# Patient Record
Sex: Male | Born: 1960
Health system: Southern US, Community
[De-identification: ages and names within clinical notes are randomized; demographics above are authoritative.]

## PROBLEM LIST (undated history)

## (undated) DIAGNOSIS — F1020 Alcohol dependence, uncomplicated: Secondary | ICD-10-CM

## (undated) DIAGNOSIS — E872 Acidosis: Secondary | ICD-10-CM

## (undated) DIAGNOSIS — Z8489 Family history of other specified conditions: Secondary | ICD-10-CM

## (undated) DIAGNOSIS — K860 Alcohol-induced chronic pancreatitis: Secondary | ICD-10-CM

## (undated) DIAGNOSIS — F32A Depression, unspecified: Secondary | ICD-10-CM

## (undated) DIAGNOSIS — R413 Other amnesia: Secondary | ICD-10-CM

## (undated) DIAGNOSIS — F1110 Opioid abuse, uncomplicated: Secondary | ICD-10-CM

## (undated) DIAGNOSIS — E7800 Pure hypercholesterolemia, unspecified: Secondary | ICD-10-CM

## (undated) DIAGNOSIS — K709 Alcoholic liver disease, unspecified: Secondary | ICD-10-CM

## (undated) DIAGNOSIS — S41009A Unspecified open wound of unspecified shoulder, initial encounter: Secondary | ICD-10-CM

## (undated) DIAGNOSIS — IMO0001 Reserved for inherently not codable concepts without codable children: Secondary | ICD-10-CM

## (undated) DIAGNOSIS — N183 Chronic kidney disease, stage 3 unspecified: Secondary | ICD-10-CM

## (undated) DIAGNOSIS — Z794 Long term (current) use of insulin: Secondary | ICD-10-CM

## (undated) DIAGNOSIS — B1920 Unspecified viral hepatitis C without hepatic coma: Secondary | ICD-10-CM

## (undated) DIAGNOSIS — E119 Type 2 diabetes mellitus without complications: Secondary | ICD-10-CM

## (undated) DIAGNOSIS — F329 Major depressive disorder, single episode, unspecified: Secondary | ICD-10-CM

## (undated) DIAGNOSIS — M1990 Unspecified osteoarthritis, unspecified site: Secondary | ICD-10-CM

## (undated) DIAGNOSIS — E46 Unspecified protein-calorie malnutrition: Secondary | ICD-10-CM

---

## 1999-08-08 ENCOUNTER — Inpatient Hospital Stay (HOSPITAL_COMMUNITY): Admission: EM | Admit: 1999-08-08 | Discharge: 1999-08-11 | Payer: Self-pay | Admitting: Emergency Medicine

## 1999-08-08 ENCOUNTER — Encounter: Payer: Self-pay | Admitting: Emergency Medicine

## 2000-02-01 ENCOUNTER — Inpatient Hospital Stay (HOSPITAL_COMMUNITY): Admission: EM | Admit: 2000-02-01 | Discharge: 2000-02-03 | Payer: Self-pay | Admitting: Emergency Medicine

## 2000-06-26 ENCOUNTER — Encounter: Payer: Self-pay | Admitting: Internal Medicine

## 2000-06-26 ENCOUNTER — Inpatient Hospital Stay (HOSPITAL_COMMUNITY): Admission: EM | Admit: 2000-06-26 | Discharge: 2000-07-02 | Payer: Self-pay | Admitting: Anesthesiology

## 2000-07-14 ENCOUNTER — Encounter: Admission: RE | Admit: 2000-07-14 | Discharge: 2000-07-14 | Payer: Self-pay | Admitting: Internal Medicine

## 2000-08-02 ENCOUNTER — Encounter: Admission: RE | Admit: 2000-08-02 | Discharge: 2000-08-02 | Payer: Self-pay | Admitting: Internal Medicine

## 2003-10-09 ENCOUNTER — Inpatient Hospital Stay (HOSPITAL_COMMUNITY): Admission: EM | Admit: 2003-10-09 | Discharge: 2003-10-11 | Payer: Self-pay | Admitting: Family Medicine

## 2004-03-13 ENCOUNTER — Emergency Department (HOSPITAL_COMMUNITY): Admission: EM | Admit: 2004-03-13 | Discharge: 2004-03-13 | Payer: Self-pay | Admitting: Podiatry

## 2004-06-03 ENCOUNTER — Ambulatory Visit: Payer: Self-pay | Admitting: *Deleted

## 2004-06-23 ENCOUNTER — Ambulatory Visit: Payer: Self-pay | Admitting: Family Medicine

## 2004-10-02 ENCOUNTER — Ambulatory Visit: Payer: Self-pay | Admitting: Family Medicine

## 2004-12-04 ENCOUNTER — Ambulatory Visit: Payer: Self-pay | Admitting: Family Medicine

## 2006-01-17 ENCOUNTER — Emergency Department (HOSPITAL_COMMUNITY): Admission: EM | Admit: 2006-01-17 | Discharge: 2006-01-17 | Payer: Self-pay | Admitting: Emergency Medicine

## 2006-01-19 ENCOUNTER — Emergency Department (HOSPITAL_COMMUNITY): Admission: EM | Admit: 2006-01-19 | Discharge: 2006-01-19 | Payer: Self-pay | Admitting: Family Medicine

## 2006-01-21 ENCOUNTER — Emergency Department (HOSPITAL_COMMUNITY): Admission: EM | Admit: 2006-01-21 | Discharge: 2006-01-21 | Payer: Self-pay | Admitting: Emergency Medicine

## 2006-01-23 ENCOUNTER — Emergency Department (HOSPITAL_COMMUNITY): Admission: EM | Admit: 2006-01-23 | Discharge: 2006-01-23 | Payer: Self-pay | Admitting: Family Medicine

## 2007-04-11 ENCOUNTER — Emergency Department (HOSPITAL_COMMUNITY): Admission: EM | Admit: 2007-04-11 | Discharge: 2007-04-11 | Payer: Self-pay | Admitting: Emergency Medicine

## 2007-04-15 ENCOUNTER — Emergency Department (HOSPITAL_COMMUNITY): Admission: EM | Admit: 2007-04-15 | Discharge: 2007-04-15 | Payer: Self-pay | Admitting: *Deleted

## 2010-11-07 NOTE — H&P (Signed)
[place]. [facility]  Patient:    Raymond Winters, Raymond Winters                             MRN: [id number] Adm. Date:  [id number] Attending:  Rosanne Sack CC:         Room [phone]                         History and Physical  DATE OF BIRTH:  [birth year]  PROBLEM LIST: 1. Acute alcoholic pancreatitis. 2. Dehydration. 3. Polysubstance abuse; alcohol, cocaine, and heroin intranasally. 4. Mild elevation of the LFTs secondary to alcohol abuse. 5. History of ______ in 1980 and 1997.  CHIEF COMPLAINT:  Epigastric pain, nausea, vomiting.  HISTORY OF PRESENT ILLNESS:  The patient is a 50 year old black man, who presents with a 30-hour history of epigastric pain, radiating to the back following the elt distribution since about 8 p.m. on Thursday, August 07, 1999.  This epigastric pain was associated with nausea and vomiting.  The patient was unable to hold liquids or solids down since Thursday night.  The patient is a chronic alcoholic who drinks about a fifth of liquor a day.  He also uses cocaine and heroin on a  regular basis.  He has a previous history of pancreatitis as described in the problem list.  His last drink was taken on Thursday, August 07, 1999, at 6 p.m. The patient also drinks about 40 ounces of beer everyday.  He denies chest pain, shortness of breath, orthopnea, or dyspnea on exertion, PND, lower extremity edema, fever, chills, diarrhea, blurred vision, headache, syncope, tremor.  He denies  history of seizure activity or withdrawal syndrome in the past.  PAST MEDICAL HISTORY:  As problem list.  ALLERGIES:  None.  MEDICATIONS:  None.  FAMILY MEDICAL HISTORY:  Negative for diabetes, stroke, early coronary artery disease, malignancy, hypertension.  SOCIAL HISTORY:  The patient is single.  He has no children.  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  He denies smoking.  He drinks and uses cocaine and heroin as described in problem  list.  He denies intravenous drug use.  PHYSICAL EXAMINATION:  VITAL SIGNS:  Temperature 98.0, blood pressure 134/98, heart rate 67, respirations 16 to 22.  HEENT:  Normocephalic, atraumatic.  Nonicteric sclerae.  Conjunctivae within normal limits.  PERRLA.  EOMI.  Funduscopic examination negative for papilledema or hemorrhages.  Dry mucous membranes.  Oropharynx clear.  NECK:  Supple.  No JVD.  No bruits.  No adenopathy.  No thyromegaly.  LUNGS:  Clear to auscultation bilaterally without crackles, wheezes.  Good air movement bilaterally.  CARDIAC:  Regular rate and rhythm without murmurs, rubs, or gallops.  Normal S1, S2.  ABDOMEN:  Epigastric tenderness of the mild to moderate range.  No rebound.  No  guarding.  No hepatosplenomegaly.  Bowel sounds were present.  No bruits.  No masses.  RECTAL:  Examination, guaiac-negative stools.  Prostatic size within normal limits. No nodules.  Normal sphincter tone.  GU:  Examination within normal limits.  EXTREMITIES:  No edema, clubbing, or cyanosis.  Pulses 2+ bilaterally.  NEUROLOGICAL:  Alert and oriented x 3.  Strength 5/5 in all extremities.  DTRs /5 in all extremities.  Cranial nerves II-XII intact.  Sensorial intact.  Plantar reflexes downgoing bilaterally.  LABORATORY DATA:  Amylase 646, lipase 821, alcohol level undetectable.  Sodium 38, potassium 4.6, chloride 96,  CO2 32, BUN 13, creatinine 1.0, glucose 145, total bilirubin 1.0, alkaline phosphatase 103, SGOT 63, SGPT 57, albumin 4.1. Hemoglobin 15.8, MCV 84, WBC 6.7, platelets 260.  Urine drug screen positive for cocaine and opiates.  Urinalysis negative.  ASSESSMENT AND PLAN: 1. Acute pancreatitis with intractable epigastric pain, nausea and vomiting. 2. Alcohol abuse--the patient presents with acute alcoholic pancreatitis.    Also cocaine has been associated with pancreatitis.  The patients abdominal    examination shows no signs of peritoneal symptoms.   There is no evidence of    bowel obstruction.  The patient remains hemodynamically stable and afebrile.  Will admit the patient to a regular bed for bowel rest, supportive care    with intravenous fluids and pain control.  Intravenous Pepcid will be also    provided.  The patients pancreatic enzymes will be followed in the morning. 3. Dehydration--the patients physical examination is consistent with dehydration.    We will start intravenous hydration therapy.  The fluid balance will be    monitored daily as well as the patients orthostatic blood pressures. 4. Polysubstance abuse--we will go ahead and start thiamine, multivitamins. Due to    the high risk for withdrawal syndrome, we will go ahead and start Librium    p.o.  If the patient cannot tolerate tablets due to nausea, will consider    intravenous phenobarbital for alcohol detoxification.  Alcohol and drug    service information will be given to the patient prior to discharge.  I had  long discussion with Raymond Winters about the importance of quitting alcohol and    illegal drug use. 5. Mild elevation of the LFTs--this mild elevation of the transaminases are    likely to be associated with alcohol use.  Will follow with another set of    LFTs in about two days to confirm the improvement ______ expected being    off of alcohol.  I would not consider hepatitis serologies at this point. DD:  08/09/99 TD:  08/09/99 Job: [id number]

## 2010-11-07 NOTE — H&P (Signed)
[name], [name] NO.:  [id number]   MEDICAL RECORD NO.:  [id number]                   PATIENT TYPE:  EMS   LOCATION:  [facility]                                 FACILITY:  [facility]   PHYSICIAN:  Corinna L. Lendell Caprice, MD             DATE OF BIRTH:  [birth year]   DATE OF ADMISSION:  10/09/2003  DATE OF DISCHARGE:                                HISTORY & PHYSICAL   CHIEF COMPLAINT:  Comatose.   HISTORY OF PRESENT ILLNESS:  Raymond Winters is a 50 year old black male who was  brought to the emergency room by an acquaintance who has since left.  He  reportedly was obtunded and had a respiratory rate of 6.  His friend stated  that he had been doing drugs.  The patient was given Narcan and he woke up.  The patient reports that he just got out of prison today where he was  incarcerated for 3 months for drug-related crime.  He injected heroin,  smoked crack and drank a 40-ounce and some liquor.  He reports that he had a  negative HIV and hepatitis screen in jail.  He has a strong history of  alcohol and polysubstance abuse prior to incarceration.  Also of note, the  patient has been having polyuria, polydipsia, blurry vision, weight loss and  has a family history of diabetes.   PAST MEDICAL HISTORY:  Past medical history is significant for:  1. Pancreatitis.  2. Polysubstance abuse.   MEDICATIONS:  None.   ALLERGIES:  None.   SOCIAL HISTORY:  The patient plans on staying with his sister once he is  discharged from the hospital, but again, he just got out of jail.  He smokes  cigarettes occasionally, uses IV drugs but does not share needles.  He is  heterosexual.  He drinks alcohol and has been admitted with alcoholic  pancreatitis in the past.  He has also the crack cocaine abuse history.   FAMILY HISTORY:  His mother died of complications of diabetes.  His sister  has diabetes.   REVIEW OF SYSTEMS:  Review of systems as above, otherwise, negative.   PHYSICAL  EXAMINATION:  VITAL SIGNS:  On physical examination, his  temperature is 97.1, blood pressure 101/54, pulse initially was 115,  respirations initially 6, oxygen saturation initially 83%; currently, his  blood pressure is 115/74, pulse 106, respiratory rate 20, oxygen saturation  100% on room air.  GENERAL:  In general, the patient is alert and oriented, in no acute  distress.  HEENT:  Normocephalic, atraumatic.  Pupils equal, round and reactive to  light.  Sclerae nonicteric.  He has slightly dry mucous membranes.  NECK:  Neck is supple.  No lymphadenopathy.  No thyromegaly.  LUNGS:  Lungs are clear to auscultation bilaterally without wheezes, rhonchi  or rales.  CARDIOVASCULAR:  Regular rate and rhythm without murmurs, gallops or rubs.  ABDOMEN:  Abdomen is soft, nontender, nondistended.  GU AND RECTAL:  Deferred.  EXTREMITIES:  No clubbing, cyanosis, or edema.  SKIN:  No rash.  PSYCHIATRIC:  The patient is calm and cooperative.  NEUROLOGIC:  Alert and oriented.  Cranial nerves and sensorimotor exam  intact.   LABORATORIES:  White blood cell count 5, hemoglobin 11, hematocrit 32,  platelet count 256,000.  Sodium 130, potassium 3.7, chloride 94, bicarbonate  16, glucose 700, BUN 16, creatinine 1.4, albumin 3.6, total protein 6.6,  calcium 8.6, AST 76, ALT 106, alkaline phosphatase 91, total bilirubin 0.8.  Blood alcohol level is 169.  Urine drug screen positive for cocaine and  opiates.   EKG shows SVT, probably sinus tachycardia to my reading.   ASSESSMENT AND PLAN:  1. Heroin overdose.  The patient is currently alert and oriented and has     stable vital signs.  He will be admitted to the stepdown unit for     monitoring.  I have encouraged him to abstain from drugs and will     certainly need Alcoholics Anonymous or Narcotics Anonymous as an     outpatient.  His first use since getting out of jail was today and I     therefore feel that withdrawal from any substance is unlikely  in this     case.  2. Alcohol intoxication.  3. Crack cocaine abuse.  4. Probable diabetic ketoacidosis.  He has an anion gap of 20.  I will check     his blood for acetone but I suspect the acidosis is due to diabetic     ketoacidosis, but also likely is lactic acidosis from his overdose.     Nevertheless, he will be placed on intravenous fluids, insulin drip,     serial electrolytes.  He will need diabetic teaching and given the fact     that he is a new-onset diabetic and due to his thin nature, I suspect he     will need insulin upon discharge, at least short-term.  5 . History of pancreatitis.                                                Corinna L. Lendell Caprice, MD    CLS/MEDQ  D:  10/09/2003  T:  10/10/2003  Job:  [id number]

## 2010-11-07 NOTE — Discharge Summary (Signed)
[place]. [facility]  Patient:    Raymond Winters, Raymond Winters                             MRN: [id number] Adm. Date:  [id number] Disc. Date: [id number] Attending:  Phifer, Harriett Sine [place] Dictator:   Marisue [place], M.D.                           Discharge Summary  DISCHARGE DIAGNOSES: 1.  Alcoholic pancreatitis. 2.  Alcoholic hepatitis. 3.  Cocaine heroine abuse.  DISCHARGE MEDICATIONS: 1.  Protonics 40 mg one tablet let in the morning. 2.  Thiamine 100 mg one tablet in the morning. 3.  Folic acid 1 mg one tablet in the morning. 4.  Darvocet-N 100 one table four times a day as needed.  CONSULTATIONS: None.  PROCEDURES: None.  CHIEF COMPLAINT ON ADMISSION: Abdominal pain of one days duration.  HISTORY OF PRESENT ILLNESS: A 50 year old African-American male, Raymond Winters, with a history significant for alcohol abuse, presented to the emergency department with abdominal pain for one day. This started after an episode of alcohol use, six pack of beer and half pint of liquor two days prior to admission. The patient is epigastric, burning type with no radiation, associated with pain on the right side of the back relieved by resting on the left side. He admits to being nauseated with a few episodes of vomiting. Denies fever, chills, diarrhea, hematemesis, hematochezia, melena. Denied shortness of breath, chest pain, headache, blurred vision, syncope, tremors. Denied history of seizure activity or withdrawn seizures in the past.  PAST MEDICAL HISTORY: History of hospitalization for alcoholic pancreatitis in 1980, 1997 and in February of 2001.  MEDICATIONS: None.  ALLERGIES: None.  HABITS: Cocaine and heroine occasional use. Denies tobacco use. Denies intravenous drug use and has been consuming alcohol for the past 20 years.  FAMILY HISTORY: Noncontributory.  SOCIAL HISTORY: Single. No children. He is in the [employer] business.  PHYSICAL EXAMINATION:  VITAL SIGNS:  Temperature was 98.4, pulse rate 70, blood pressure 140/90, respiratory rate 15, 98% saturation on room air.  GENERAL: He was a well-nourished, moderately built male in moderate distress.  HEENT: Muddy conjunctivae and icteric. Pupils round, reactive to light and accommodation. Extraocular muscles intact. Tympanic membranes and oropharynx clear and moist.  NECK: Supple. No lymphadenopathy, jugular vein distension, bruits or thyromegaly.  CARDIOVASCULAR: S1 and S2 present. Rate and rhythm regular without murmurs, rubs or gallops.  RESPIRATORY: Clear to auscultation bilaterally without adventitious sounds.  ABDOMEN: Moderate epigastric tenderness. Soft and nondistended. Bowel sounds present. No hepatorenosplenomegaly.  EXTREMITIES: No cyanosis, clubbing, or pedal edema. Pulses are 3+.  NEUROLOGICAL: Alert and oriented times three. Cranial nerves 2-12 grossly intact. Sensation and motor examinations were normal.  LABORATORY VALUES ON ADMISSION: WBC 7.2, hemoglobin 14.6, MCV 84, platelets 267.  Sodium 130, potassium 3.9, chloride 94, bicarbonate 24, BUN 11, creatinine 0.7, glucose 126. AST 101, ALT 66, alkaline phosphatase 89, bilirubin 1.1, total protein 8.9, albumin 7.4, amylase 543, lipase 689.  Urinalysis showed wbc 67 and small leukocyte esterase positive.  HOSPITAL COURSE: #1 - ALCOHOLIC PANCREATITIS: Evidence from his alcohol use, elevated lipase, nausea and vomiting and we made a presumptive diagnosis of alcoholic pancreatitis. We kept him no per oral, hydrated him, gave him morphine sulfate with adequate pain relief. His pain gradually declined from 9/10 to 7/10 and to 4/10 to 2/10 on the day of  discharge. He is completely asymptomatic and wished to go home. He also wished to eat and he was started on clear liquids and finally was advanced to a solid diet on discharge.  #2 - ELEVATED TRANSAMINASE: AST and ALT were in the ratio of 2:1 which is diagnostic of alcohol  use. He was given protective thiamine and folate during his stay here. The cause of the elevated transaminase is prolonged alcohol use. He is being discharged also on thiamine and folate.  #3 - ALCOHOL, HEROINE AND COCAINE ABUSE: He was counseled to abstain from substance abuse and he is has agreed to go through the Alcoholic Anonymous meetings. He has been given a chart which explains all the different days and different timings when the Alcoholic Anonymous meetings are conducted in [place]. The patient has shown active interest in going to these programs. He has not wished for any other resources or help.  DISPOSITION: The patient is discharged home, condition stable. Diet regular.  RESIDENT PHYSICIAN: Dr. Marcelino Duster DD:  02/03/00 TD:  02/04/00 Job: [id number]

## 2010-11-07 NOTE — Discharge Summary (Signed)
[place]. [facility]  Patient:    Raymond Winters, Raymond Winters                             MRN: [id number] Adm. Date:  [id number] Disc. Date: [id number] Attending:  Rosanne Sack CC:         [facility], Medical Records, [place], [place]             [place]. Sharyn Lull, M.D.                           Discharge Summary  ADMISSION DIAGNOSES:  1960/11/22  FINAL DIAGNOSES: 1. Acute alcoholic pancreatitis. 2. Dehydration. 3. Hypokalemia secondary to alcoholism, dehydration, and hypomagnesemia. 4. Hypomagnesemia secondary to alcoholism. 5. Hypertension. 6. Polysubstance abuse. 7. Chronic alcoholism.    a. History of acute pancreatitis in 1980 and 1997.    b. Mild alcoholic hepatitis.  DISCHARGE MEDICATIONS: 1. K-Dur 40 mEq p.o. q.d. x 1 week. 2. Maalox, magnesium oxide 400 mg p.o. t.i.d. x 2 weeks. 3. Librium 25 mg b.i.d. x 1 day, then 25 mg q.d. x 2 days, then stop. 4. Klonopin 0.1 mg p.o. b.i.d. 5. Pepcid 20 mg p.o. b.i.d. 6. Vicodin 1-2 tablets q.4h. p.r.n. pain, 20 pills, no refills. 7. Phenergan 25 mg p.o. q.6h. p.r.n. nausea.  DISCHARGE FOLLOW-UP AND DISPOSITION:  Raymond Winters will be followed in the [facility] in [place] within five to seven days.  During this follow-up  visit, the patients serum potassium and serum magnesium are to be reevaluated.  The patients blood pressure also needs to be rechecked at the time of this hospitaL follow-up visit.  CONSULTATIONS:  None.  PROCEDURES:  None.  ADMISSION LABORATORY DATA:  Amylase 646, lipase 821.  DISCHARGE LABORATORY DATA:  Lipase 37, magnesium 1.4.  Sodium 136, potassium 3.5, chloride 103, CO2 28, BUN 6, creatinine 1.0, glucose 93.  HOSPITAL COURSE:  Raymond Winters is a very pleasant 50 year old African-American male admitted to [facility] on August 08, 1999, with acute alcoholic pancreatitis.  Please see admission H&P by Dr. Jamie Brookes for further details regarding the history  of presentation, physical exam, and laboratory data.  #1 - ACUTE ALCOHOLIC PANCREATITIS:  The patient was admitted with epigastric pain that radiated to the back following the belt distribution.  The patients pancreatic enzymes were elevated on admission.  The amylase was 646 and the lipase 821.  No evidence of acute abdomen was noticed on physical exam.  No evidence of infectious process was identified.  The patient was admitted for bowel rest, pain control, and supportive care.  Intravenous Pepcid was given.  Also, Demerol intravenously was given to the patient for pain control.  The lipase decreased o 396 on day #2.  On the day of discharge, the lipase was 37.  On the day of discharge, the patient was able to tolerate a full liquid diet without complications.  Pepcid was switched to tablets.  The pain was well controlled just with Vicodin.  Further follow-up on the patients acute pancreatitis will be done in the [facility] in [place], [place].  During this admission, no evidence of pseudocyst by clinical response to therapy and physical exam was noticed.  The medical condition at the time of discharge was determined improved.  #2 - DEHYDRATION:  On admission, the physical exam was consistent with dehydration. Intravenous hydration therapy was given throughout this hospital  stay.  At the ime of discharge, no evidence of dehydration was noticed.  #3 - HYPOKALEMIA, HYPOMAGNESEMIA:  These two electrolyte abnormalities were associated with malnutrition, alcoholism, and dehydration.  Potassium and magnesium supplementation were provided during this hospital stay.  No complications associated with these electrolyte abnormalities were noticed.  On day #2, the patients potassium was 3.4, and the magnesium level was 1.4.  The phosphorus level was 2.7.  With supplementation, the patients potassium started to improve at the time of discharge.  Further follow-up on the  patients magnesium and potassium levels will be done at the [place] in [place].  #4 - POLYSUBSTANCE ABUSE:  Raymond Winters reported alcohol, cocaine, and heroin (intranasally and not intravenously) abuse.  Given the degree of alcoholism, Librium was started on admission without complications.  The only evidence of perhaps some mild alcohol withdrawal syndrome was slight hypertension. Otherwise, no evidence of seizures or confusion was noticed throughout this hospital stay. We discussed at length with the patient the importance of rehabilitation upon discharge for these polysubstance abuses.  Alcohol and drug services information was given to the patient.  The patient was advised to enroll himself in one of these rehabilitation programs upon discharge.  He would like to check with the [facility] to see what kind of resources is eligible for prior to send the  rehabilitation program.  Follow-up of the patients LFTs also is recommended based on the mild alcoholic hepatitis.  On admission, the SGOT was 63 and the SGPT 57.  On the day of discharge, the patient was afebrile and hemodynamically stable. is medical condition was determined improved at the time of discharge. DD:  08/11/99 TD:  08/11/99 Job: [id number]

## 2010-11-07 NOTE — Discharge Summary (Signed)
[name], Winters NO.:  [id number]   MEDICAL RECORD NO.:  [id number]                   PATIENT TYPE:  INP   LOCATION:  6715                                 FACILITY:  [facility]   PHYSICIAN:  Jackie Plum, M.D.             DATE OF BIRTH:  01-Dec-1960   DATE OF ADMISSION:  10/09/2003  DATE OF DISCHARGE:  10/11/2003                                 DISCHARGE SUMMARY   DISCHARGE DIAGNOSES:  1. Mental status change secondary to diabetes ketoacidosis, thus resolved.  2. Diabetic ketoacidosis, resolved.  3. Newly diagnosed diabetes mellitus.  4. History of polysubstance abuse.  5. History of alcohol abuse.   DISCHARGE MEDICATIONS:  1. Lantus 30 units subcutaneous injection q.h.s.  2. Glyburide 2.5 mg p.o. daily.  3. Timentin 100 mg p.o. daily.  4. Folic acid 1 mg p.o. daily.   ACTIVITY:  As tolerated.   DIET:  2000 calorie ADA diet.   DISCHARGE INSTRUCTIONS:  The patient has been instructed to take his  medications as instructed.  He should be compliant to his dietary  restrictions.  He has been counseled extensively to stop using heroine,  cocaine, and alcohol.  The effects of these drugs of abuse on his general  well being has been discussed with the patient and he expressed  understanding.  He is to follow up with diabetes education center next week  and also with [employer] as soon as possible to register to be  seen by Raechel Marcos in less than four weeks.   CONDITION ON DISCHARGE:  Improving and satisfactory.   REASON FOR ADMISSION:  Diabetic ketoacidosis.  Please see admission H&P by  Corinna L. Lendell Caprice, M.D. dictated October 09, 2003.  The patient presented to  the emergency room with obtundation and respiratory rate of about 6 per  minute.  He received Narcan after which the patient's mental status was  improved.  He has just gotten out of prison recently after 10 months of  incarceration for drug-related crime.  He had used  some liquor and heroine  and smoked some crack cocaine as well.  The patient had indicated history of  polyuria and polydipsia with visual changes as well as weight loss.  His  family history is positive for diabetes in his mother and siblings.   PHYSICAL EXAMINATION:  According to admission H&P by Dr. Lendell Caprice.  VITAL SIGNS:  Temperature 97.1, heart rate of 115 per minute, satisfactory  of 83% on room air.  His respiratory rate which was 6 initially has been  improved with rate up to 20 per minute and saturations had gone up to 100%  on room air thereafter.  GENERAL:  Alert and oriented.  HEENT:  Pupils equal, round, and reactive to light.  He did not have any  conjunctival icterus.  Mucous membranes were said to be dry.  LUNGS:  Clear to  auscultation bilaterally.  HEART:  Auscultation was unremarkable.  ABDOMEN:  Soft and nontender with normal active bowel sounds.  EXTREMITIES:  He did not have edema.  NEUROLOGY:  Intact.   LABORATORY DATA:  White blood cell count 5, hemoglobin 11, hematocrit 32,  platelet count 236,000.  He had hyponatremia of 130, bicarbonate of 60,  glucose 700, potassium 1.4, and SGOT of 76 with SGPT of 106.  His urine drug  screen was positive for cocaine and opiates.   EKG showed sinus tachycardia, and his __________ anion gap was 20.   He was therefore admitted to Hospitalist Service for management of diabetic  ketoacidosis.   HOSPITAL COURSE:  Problem 1.  DK.  The patient was admitted to ICU.  He was  started on IV Insulin drip per [facility] protocol.  He was hydrated with  IV fluids.  His lipid status was carefully monitored and any imbalances were  expeditiously corrected.  By yesterday, i.e., next day after admission, the  patient's anion gap has closed and his glucose levels were less than 50 mg  per day.  Yesterday, he did not have any chest pain or dizziness.  He just  felt a little bit weak without any fever or chills.  He was therefore   transferred to telemetry bed for further monitoring and observation  overnight.  The patient was seen by nutrition consult at which time,  additional requirements and expectations with respect to his diagnosis of  diabetes which is new were discussed and appropriately confirmed for  understanding with the patient.  He is currently not in DKA.  He is deemed  appropriate for discharge today.  On rounds this morning, the patient's  blood pressure was 128/72, his CBG was 132 mg/dl, respiratory rate 20,  temperature 98.5 degrees Fahrenheit, pulse rate 92 per minute.  He had no  chest pain, shortness of breath, dizziness, generalized weakness, or visual  changes.  He does not have any polyuria or polydipsia.  Mucous membranes  were moist.  He does not any JVD.  Lungs were clear to auscultation  bilaterally.  Cardiac examination revealed regular rate and rhythm without  gallops or murmur.  Abdomen was soft and nontender with normal active bowel  sounds.  Extremities did not reveal any edema.   LABORATORY DATA:  Lab work done yesterday indicated hemoglobin A1C of 13.2%.  The patient is therefore being discharged home to his family with continued  outpatient treatment of his diabetes.  I prescribed about 45 days of  treatment for him to last him for the time being while he is awaiting for  appointment with a Nolberto Cheuvront at [facility].  He showed that he can inject  himself with insulin, he knows how to do it, and has received adequate  teaching by staff prior to discharge.   This morning I spent some time to discuss with the patient the reason for  hospitalization, all the workup that was done, his final discharge  diagnosis, and the tailored plan of care for him on the outpatient level.  All his questions were appropriately answered satisfactorily.   I spent more than 30 minutes preparing this patient for discharge today.                                               Jackie Plum,  M.D.   GO/MEDQ  D:  10/11/2003  T:  10/11/2003  Job:  [id number]   cc:   [facility]   [facility]

## 2010-11-07 NOTE — Discharge Summary (Signed)
[place]  Patient:    Raymond Winters                             MRN: [id number] Adm. Date:  [id number] Disc. Date: [id number] Attending:  Farley Ly                           Discharge Summary  DISCHARGE DIAGNOSES: 1. Alcoholic pancreatitis. 2. Polysubstance abuse. 3. Anemia, mild.  DISCHARGE MEDICATIONS:  None  FOLLOWUP APPOINTMENT:  Raymond Winters was instructed to followup with Dr. Wilkie Aye in the Internal Medicine Outpatient clinic at 2:30 p.m. on July 14, 2000.  PROCEDURES:  None  COMPLICATIONS:  None  CHIEF COMPLAINT:  Nausea, vomiting.  HISTORY OF PRESENT ILLNESS:   Raymond Winters is a 50 year old black male with a past medical history of pancreatitis and polysubstance disease who presents with a one day history of nausea, vomiting, and abdominal tenderness.  Raymond Winters states that after drinking a pink of liquor yesterday, he began to experience nausea and vomiting.  He reports emesis x approximately 7-8 times and denies blood in his emesis.  He denies diarrhea or constipation, melena, and bright red blood per rectum.  He denies stool color changes.  He reports decreased p.o. with his good intake being Thursday.  He also states that his abdomen became tender yesterday but denies radiation to back.  He states the pain is continuous and rates it at a 8-9 out of 10.  He took Tylenol with some relief and is able to achieve some relief with change in position.  REVIEW OF SYSTEMS: Negative for fevers or chills, visual or hearing changes, cough, chest pain, shortness of breath, and dysuria.  PAST MEDICAL HISTORY: 1. History of pancreatitis.  Last episode three to four months ago. 2. Polysubstance abuse (continued alcohol abuse, remote heroine and cocaine    use per patient).  MEDICATIONS: None  ALLERGIES:  No known drug allergies.  SOCIAL HISTORY: [name] lives here in [place] by himself.  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  He does not smoke tobacco currently although he has a remote history.  He drinks approximately one pint of liquor a day, and one to two beers a day.  He denies elicit drug use.  FAMILY MEDICAL HISTORY:  Mom died from stroke at the age of 28, and dad died from an MI at the age of 86.  He has sister with diabetes and hypertension. He has seven brothers and two sisters all in good health.  PHYSICAL EXAMINATION:  VITAL SIGNS:  Temperature 97.0, blood pressure 114/81, pulse 103, respiratory rate 20.  GENERAL:  This is a restless, agitated, black male.  HEENT:  Pupils are equally round and minimally reactive, pupils small bilaterally, extraocular muscles are intact, oropharynx clear without erythema or exudate, tongue notably tremulous.  CARDIOVASCULAR: Regular rate and rhythm, heart sounds are distant.  RESPIRATORY:  Clear to auscultation bilaterally.  ABDOMEN:  Bowel sounds present in all four quadrants, soft, nondistended, diffusely tender greatest in the mid epigastric region.  EXTREMITIES:  No edema in lower extremities bilaterally, 1+ posterior tibial pulses bilaterally.  NEUROLOGIC:  Cranial nerves 2-12 grossly intact, no focal deficits.  LABORATORY DATA:  CBC:  White count 7.7, hemoglobin 14.6, MCV 85.9, platelets 285,000, 77 neutrophils and 14 lymphocytes.  BMP: Sodium 134, potassium 3.5, chloride 97, bicarb 21,  BUN 12, creatinine 0.9 and glucose 123. Calcium 8.8, total protein 7.6, albumin 4.1, AST 47, ALT 36, alkaline phosphatase 79, total bilirubin 0.5.  Alcohol level 98.  Lipase 734 and amylase 488.  HOSPITAL COURSE: #1 ALCOHOLIC PANCREATITIS:  Raymond Winters was noted to have markedly elevated pancreatic enzymes on admission as well as severe abdominal pain.  He was managed with IV pain medications and made NPO for bowel rest.  Over the next few days, he gradually began to have a returning desire to eat and he was started on clear liquids.  His pain  medications were decreased to p.o. pain medications. He tolerated this well and his diet was advanced to a soft diet and pain medications discontinued.  However, after having one to two meals on soft diet, Raymond Winters again began to have abdominal pain.  Therefore, he was returned to a clear liquid diet and again given p.o. pain medications.  After again tolerating several meals of clear liquids well, Raymond Winters again was advanced to a soft diet and his pain medications discontinued.  This time, he tolerated the diet well without a return of abdominal pain or nausea or vomiting.  #2 POLYSUBSTANCE ABUSE:  Raymond Winters was seen while in the hospital by the care manager.  Options and community resources for polysubstance abuse was discussed.  Care management reports that he did have a great deal of insight into his problem and was interested in pursuing some of these resources.  He was given information and was given repeated encouragement to pursue these resources.  #3 ANEMIA, MILD:  Raymond Winters was noted to have mild anemia while in the hospital.  He was heme negative on rectal and this anemia is likely secondary to a combination of bone marrow suppression secondary to alcohol abuse and hydration while in the hospital.  This may need further workup as an outpatient if it persists.  DISCHARGE LABORATORY:  CBC:  White count 3.7, hemoglobin 12.1, MCV 86.9, platelet count 234,000.  BMP: Sodium 137, potassium 3.9, chloride 104, bicarb 28, glucose 101, BUN 4, and creatinine 1.0, calcium 8.5.  DISPOSITION:  Raymond Winters was discharged to home in good condition. DD:  07/07/00 TD:  07/07/00 Job: [id number]

## 2011-04-01 LAB — I-STAT 8, (EC8 V) (CONVERTED LAB)
Acid-base deficit: 2
BUN: 5 — ABNORMAL LOW
Bicarbonate: 23.4
Chloride: 104
Glucose, Bld: 639
HCT: 40
Hemoglobin: 12.6 — ABNORMAL LOW
Hemoglobin: 13.6
Operator id: 285841
Potassium: 3.7
Sodium: 135
Sodium: 137
TCO2: 25
TCO2: 26
pH, Ven: 7.349 — ABNORMAL HIGH

## 2011-04-01 LAB — URINE MICROSCOPIC-ADD ON

## 2011-04-01 LAB — CBC
HCT: 33.6 — ABNORMAL LOW
HCT: 36.7 — ABNORMAL LOW
Hemoglobin: 12.4 — ABNORMAL LOW
MCHC: 33.7
MCHC: 33.9
MCV: 82.8
MCV: 84.7
Platelets: 207
Platelets: 223
RBC: 4.43
RDW: 13
WBC: 4.4
WBC: 7.8

## 2011-04-01 LAB — URINALYSIS, ROUTINE W REFLEX MICROSCOPIC
Glucose, UA: >1000 — AB
Hgb urine dipstick: NEGATIVE
Leukocytes, UA: NEGATIVE
Protein, ur: NEGATIVE
Specific Gravity, Urine: 1.03
Urobilinogen, UA: 0.2

## 2011-04-01 LAB — RAPID URINE DRUG SCREEN, HOSP PERFORMED: Tetrahydrocannabinol: NOT DETECTED

## 2011-04-01 LAB — POCT I-STAT CREATININE
Operator id: 285841
Operator id: 288331

## 2012-12-17 ENCOUNTER — Encounter (HOSPITAL_COMMUNITY): Payer: Self-pay | Admitting: Emergency Medicine

## 2012-12-17 ENCOUNTER — Emergency Department (HOSPITAL_COMMUNITY)
Admission: EM | Admit: 2012-12-17 | Discharge: 2012-12-17 | Disposition: A | Discharge status: Home / Self Care | Payer: Self-pay | Attending: Emergency Medicine | Admitting: Emergency Medicine

## 2012-12-17 DIAGNOSIS — T401X1A Poisoning by heroin, accidental (unintentional), initial encounter: Secondary | ICD-10-CM | POA: Insufficient documentation

## 2012-12-17 DIAGNOSIS — F1110 Opioid abuse, uncomplicated: Secondary | ICD-10-CM | POA: Insufficient documentation

## 2012-12-17 DIAGNOSIS — Y939 Activity, unspecified: Secondary | ICD-10-CM | POA: Insufficient documentation

## 2012-12-17 DIAGNOSIS — Y929 Unspecified place or not applicable: Secondary | ICD-10-CM | POA: Insufficient documentation

## 2012-12-17 DIAGNOSIS — T401X4A Poisoning by heroin, undetermined, initial encounter: Secondary | ICD-10-CM | POA: Insufficient documentation

## 2012-12-17 NOTE — ED Notes (Signed)
Pt very agitated and is asking to be discharged. MD informed.

## 2012-12-17 NOTE — ED Notes (Addendum)
Per EMS, pt using heroin on a daily basis, pt's family called EMS when they found the pt on the bathroom floor. Pt was unresponsive when EMS arrive and breathing 4-6x/min, EMS inserted an OPA and gave the pt 2mg  of narcan. Pt became responsive en route to ED. Pt's 02 sats reading 99% upon arrival. Pt was given narcan at about 1600 today. Pt states he had "some alcohol" today. Pt A&O upon arrival.

## 2012-12-17 NOTE — ED Provider Notes (Signed)
History    CSN: [id number] Arrival date & time 12/17/12  1624  First MD Initiated Contact with Patient 12/17/12 1627     Chief Complaint  Patient presents with  . Drug Overdose    Patient is a 52 y.o. male presenting with Overdose. The history is provided by the patient and the EMS personnel.  Drug Overdose This is a new problem. The current episode started 1 to 2 hours ago. The problem has been rapidly improving. Pertinent negatives include no chest pain, no abdominal pain, no headaches and no shortness of breath. Nothing aggravates the symptoms. Nothing relieves the symptoms. Treatments tried: narcan. The treatment provided significant relief.   Pt admits to heroin abuse earlier today EMS reports they were called out to house as patient was found in bathroom floor unresponsive and bradypneic He was given narcan intranasally and oral airway was placed and then patient soon woke up.  This was given at approximately 1600 He has no other complaints He denies SI He reports frequent heroin use He also admits to ETOH use Denies any other drug abuse     PMH - Diabetes Soc hx - heroin abuse   History  Substance Use Topics  . Smoking status: Not on file  . Smokeless tobacco: Not on file  . Alcohol Use: Yes    Review of Systems  Constitutional: Negative for fever.  Respiratory: Negative for cough and shortness of breath.   Cardiovascular: Negative for chest pain.  Gastrointestinal: Negative for vomiting and abdominal pain.  Skin: Negative for color change.  Neurological: Negative for headaches.  Psychiatric/Behavioral: Negative for suicidal ideas.  All other systems reviewed and are negative.    Allergies  Review of patient's allergies indicates no known allergies.  Home Medications  No current outpatient prescriptions on file. SpO2 98% Physical Exam CONSTITUTIONAL: Well developed/well nourished HEAD: Normocephalic/atraumatic, no signs of trauma EYES:  EOMI/PERRL ENMT: Mucous membranes moist NECK: supple no meningeal signs SPINE:entire spine nontender, No bruising/crepitance/stepoffs noted to spine CV: S1/S2 noted, no murmurs/rubs/gallops noted LUNGS: Lungs are clear to auscultation bilaterally, no apparent distress ABDOMEN: soft, nontender, no rebound or guarding GU:no cva tenderness NEURO: Pt is awake/alert, moves all extremitiesx4.  GCS 15.  He does not appear intoxicated EXTREMITIES: pulses normal, full ROM Well healed lesions to both forearms.  No abscess/cellulitis noted to any extremity SKIN: warm, color normal PSYCH: no abnormalities of mood noted  ED Course  Procedures   4:51 PM Pt with heroin overdose that responded to narcan He is now feeling improved, no distress and no complaints Pt is requesting d/c home I advised him of possibility of relapse due to short half life of narcan He agrees to wait until 1730 He does not want any labs performed. He reports he has otherwise been feeling well prior to overdose 5:36 PM Pt stable, no new complaints, he is awake alert and no symptoms He agrees to stay one more hour I spoke to patient and his family at length about need to be monitored in the ED 6:48 PM Pt now requesting d/c home He has no complaints, he is awake/alert.   At this point, I feel he is safe for d/c home I discussed strict return precautions with patient and family  MDM  Nursing notes including past medical history and social history reviewed and considered in documentation     Date: 12/17/2012  Rate: 102  Rhythm: sinus tachycardia  QRS Axis: normal  Intervals: normal  ST/T Wave abnormalities: nonspecific  ST changes  Conduction Disutrbances:none  Narrative Interpretation:   Old EKG Reviewed: unchanged from 04/15/2007       Joya Gaskins, MD 12/17/12 [phone]

## 2012-12-17 NOTE — ED Notes (Signed)
Family requested drug rehab information for when patient is discharged.

## 2012-12-18 ENCOUNTER — Emergency Department (HOSPITAL_COMMUNITY)
Admission: EM | Admit: 2012-12-18 | Discharge: 2012-12-18 | Disposition: A | Discharge status: Home / Self Care | Payer: Self-pay | Attending: Emergency Medicine | Admitting: Emergency Medicine

## 2012-12-18 DIAGNOSIS — Y939 Activity, unspecified: Secondary | ICD-10-CM | POA: Insufficient documentation

## 2012-12-18 DIAGNOSIS — T401X4A Poisoning by heroin, undetermined, initial encounter: Secondary | ICD-10-CM | POA: Insufficient documentation

## 2012-12-18 DIAGNOSIS — Y929 Unspecified place or not applicable: Secondary | ICD-10-CM | POA: Insufficient documentation

## 2012-12-18 DIAGNOSIS — Z79899 Other long term (current) drug therapy: Secondary | ICD-10-CM | POA: Insufficient documentation

## 2012-12-18 DIAGNOSIS — T401X1D Poisoning by heroin, accidental (unintentional), subsequent encounter: Secondary | ICD-10-CM

## 2012-12-18 DIAGNOSIS — T401X1A Poisoning by heroin, accidental (unintentional), initial encounter: Secondary | ICD-10-CM | POA: Insufficient documentation

## 2012-12-18 LAB — COMPREHENSIVE METABOLIC PANEL
Albumin: 3.2 g/dL — ABNORMAL LOW (ref 3.5–5.2)
Alkaline Phosphatase: 85 U/L (ref 39–117)
BUN: 6 mg/dL (ref 6–23)
CO2: 20 mEq/L (ref 19–32)
Chloride: 101 mEq/L (ref 96–112)
Creatinine, Ser: 0.74 mg/dL (ref 0.50–1.35)
GFR calc Af Amer: >90 mL/min (ref >90)
GFR calc non Af Amer: >90 mL/min (ref >90)
Glucose, Bld: 288 mg/dL — ABNORMAL HIGH (ref 70–99)
Potassium: 3.9 mEq/L (ref 3.5–5.1)
Total Bilirubin: 0.3 mg/dL (ref 0.3–1.2)

## 2012-12-18 LAB — CBC
HCT: 36.9 % — ABNORMAL LOW (ref 39.0–52.0)
Hemoglobin: 12.6 g/dL — ABNORMAL LOW (ref 13.0–17.0)
MCV: 82.6 fL (ref 78.0–100.0)
RBC: 4.47 MIL/uL (ref 4.22–5.81)
WBC: 3.7 10*3/uL — ABNORMAL LOW (ref 4.0–10.5)

## 2012-12-18 LAB — ETHANOL: Alcohol, Ethyl (B): 132 mg/dL — ABNORMAL HIGH (ref 0–11)

## 2012-12-18 MED ORDER — NALOXONE HCL 1 MG/ML IJ SOLN: 1.0000 mg | INTRAMUSCULAR | Status: DC | PRN

## 2012-12-18 MED ORDER — NALOXONE HCL 0.4 MG/ML IJ SOLN
INTRAMUSCULAR | Status: AC
  Administered 2012-12-18: 1 mg
  Filled 2012-12-18: qty 3

## 2012-12-18 NOTE — ED Notes (Signed)
Patient alert and talking with physician about drug use and cessation.

## 2012-12-18 NOTE — ED Notes (Addendum)
Family states pt used too much heroin today. Pt is able to maintain airway but does not respond verbally when stimulated. Withdraws from pain. Family states the pt was not trying to harm himself, "he was just trying to get high."

## 2012-12-18 NOTE — ED Provider Notes (Addendum)
History    CSN: [id number] Arrival date & time 12/18/12  1516  First MD Initiated Contact with Patient 12/18/12 1522     Chief Complaint  Patient presents with  . Drug Overdose   (Consider location/radiation/quality/duration/timing/severity/associated sxs/prior Treatment) Patient is a 52 y.o. male presenting with Overdose. The history is provided by a relative. The history is limited by the condition of the patient.  Drug Overdose This is a recurrent problem. Episode onset: unknown. The problem occurs constantly. The problem has not changed since onset.Associated symptoms comments: Family found pt unresponsive at home.  Hx of heroin OD yesterday. Nothing aggravates the symptoms. Nothing relieves the symptoms. He has tried nothing for the symptoms. The treatment provided no relief.   No past medical history on file. No past surgical history on file. No family history on file. History  Substance Use Topics  . Smoking status: Not on file  . Smokeless tobacco: Not on file  . Alcohol Use: Yes    Review of Systems  Unable to perform ROS   Allergies  Review of patient's allergies indicates no known allergies.  Home Medications   Current Outpatient Rx  Name  Route  Sig  Dispense  Refill  . metFORMIN (GLUCOPHAGE) 500 MG tablet   Oral   Take 500 mg by mouth 2 (two) times daily with a meal.          BP 124/79  Pulse 110  Resp 12  SpO2 94% Physical Exam  Nursing note and vitals reviewed. Constitutional: He is oriented to person, place, and time. He appears well-developed and well-nourished. He appears listless. No distress.  But will awake with stimulation and shouting his name  HENT:  Head: Normocephalic and atraumatic.  Mouth/Throat: Oropharynx is clear and moist.  Eyes: Conjunctivae and EOM are normal.  Pinpoint pupils  Neck: Normal range of motion. Neck supple.  Cardiovascular: Regular rhythm and intact distal pulses.  Tachycardia present.   No murmur  heard. Pulmonary/Chest: Breath sounds normal. Bradypnea noted. No respiratory distress. He has no wheezes. He has no rales.  Abdominal: Soft. He exhibits no distension. There is no tenderness. There is no rebound and no guarding.  Musculoskeletal: Normal range of motion. He exhibits no edema and no tenderness.  Neurological: He is oriented to person, place, and time. He appears listless.  Skin: Skin is warm and dry. No rash noted. No erythema.  Psychiatric: He has a normal mood and affect. His behavior is normal.    ED Course  Procedures (including critical care time) Labs Reviewed  CBC - Abnormal; Notable for the following:    WBC 3.7 (*)    Hemoglobin 12.6 (*)    HCT 36.9 (*)    Platelets 82 (*)    All other components within normal limits  COMPREHENSIVE METABOLIC PANEL - Abnormal; Notable for the following:    Glucose, Bld 288 (*)    Albumin 3.2 (*)    All other components within normal limits  ETHANOL - Abnormal; Notable for the following:    Alcohol, Ethyl (B) 132 (*)    All other components within normal limits  SALICYLATE LEVEL - Abnormal; Notable for the following:    Salicylate Lvl <2.0 (*)    All other components within normal limits  ACETAMINOPHEN LEVEL  URINE RAPID DRUG SCREEN (HOSP PERFORMED)   No results found.   Date: 12/18/2012  Rate: 110  Rhythm: sinus tachycardia  QRS Axis: normal  Intervals: normal  ST/T Wave abnormalities: nonspecific ST  changes  Conduction Disutrbances:right ventricular hypertrophy  Narrative Interpretation:   Old EKG Reviewed: unchanged   1. Accidental heroin overdose, subsequent encounter     MDM  Patient with a history of heroin overdose yesterday who presents today for same. Family found him in the house unresponsive. Here he is maintaining his oxygen saturation is 94% and has a normal blood pressure and heart rate. And he will wake briefly and then become somnolent. Pinpoint pupils no signs of trauma. Patient will be given  Narcan and reevaluated.  4:07 PM Pt woke up promptly with narcan and states he must have a stronger batch than normal because he has never had this problem.  He was offered detox however he refuses.  VS remain stable will obs to ensure no return of sx.  5:43 PM Pt is still awake and feeling well.  Will d/c home.  Gwyneth Sprout, MD 12/18/12 1744  Gwyneth Sprout, MD 12/18/12 1745

## 2012-12-18 NOTE — ED Notes (Signed)
After narcan administration patient is alert and oriented. Calm and cooperative. States he knows he needs to stop using drugs.

## 2013-03-20 ENCOUNTER — Encounter (HOSPITAL_COMMUNITY)
Admission: EM | Disposition: A | Discharge status: Home / Self Care | Payer: Self-pay | Source: Home / Self Care | Attending: Emergency Medicine

## 2013-03-20 ENCOUNTER — Encounter (HOSPITAL_COMMUNITY): Payer: Self-pay | Admitting: Emergency Medicine

## 2013-03-20 ENCOUNTER — Observation Stay (HOSPITAL_COMMUNITY)
Admission: EM | Admit: 2013-03-20 | Discharge: 2013-03-21 | Disposition: A | Discharge status: Home / Self Care | Payer: Non-veteran care | Attending: Internal Medicine | Admitting: Internal Medicine

## 2013-03-20 ENCOUNTER — Ambulatory Visit: Admit: 2013-03-20 | Payer: Self-pay | Admitting: Orthopedic Surgery

## 2013-03-20 DIAGNOSIS — E1129 Type 2 diabetes mellitus with other diabetic kidney complication: Secondary | ICD-10-CM | POA: Diagnosis present

## 2013-03-20 DIAGNOSIS — IMO0002 Reserved for concepts with insufficient information to code with codable children: Principal | ICD-10-CM | POA: Insufficient documentation

## 2013-03-20 DIAGNOSIS — E119 Type 2 diabetes mellitus without complications: Secondary | ICD-10-CM

## 2013-03-20 DIAGNOSIS — R739 Hyperglycemia, unspecified: Secondary | ICD-10-CM

## 2013-03-20 DIAGNOSIS — L0291 Cutaneous abscess, unspecified: Secondary | ICD-10-CM

## 2013-03-20 HISTORY — DX: Opioid abuse, uncomplicated: F11.10

## 2013-03-20 HISTORY — PX: I&D EXTREMITY: SHX5045

## 2013-03-20 LAB — URINALYSIS, ROUTINE W REFLEX MICROSCOPIC
Bilirubin Urine: NEGATIVE
Glucose, UA: >1000 mg/dL — AB
Hgb urine dipstick: NEGATIVE
Ketones, ur: NEGATIVE mg/dL
Protein, ur: NEGATIVE mg/dL
Urobilinogen, UA: 1 mg/dL (ref 0.0–1.0)

## 2013-03-20 LAB — COMPREHENSIVE METABOLIC PANEL
Albumin: 3.3 g/dL — ABNORMAL LOW (ref 3.5–5.2)
BUN: 12 mg/dL (ref 6–23)
Calcium: 9.4 mg/dL (ref 8.4–10.5)
Creatinine, Ser: 0.68 mg/dL (ref 0.50–1.35)
GFR calc Af Amer: >90 mL/min (ref >90)
Glucose, Bld: 319 mg/dL — ABNORMAL HIGH (ref 70–99)
Potassium: 5.2 mEq/L — ABNORMAL HIGH (ref 3.5–5.1)
Total Protein: 7.6 g/dL (ref 6.0–8.3)

## 2013-03-20 LAB — GLUCOSE, CAPILLARY
Glucose-Capillary: 249 mg/dL — ABNORMAL HIGH (ref 70–99)
Glucose-Capillary: 229 mg/dL — ABNORMAL HIGH (ref 70–99)

## 2013-03-20 LAB — CBC WITH DIFFERENTIAL/PLATELET
Basophils Relative: 0 % (ref 0–1)
Eosinophils Absolute: 0.1 10*3/uL (ref 0.0–0.7)
Eosinophils Relative: 1 % (ref 0–5)
Hemoglobin: 12.7 g/dL — ABNORMAL LOW (ref 13.0–17.0)
Lymphs Abs: 1.7 10*3/uL (ref 0.7–4.0)
MCH: 28.2 pg (ref 26.0–34.0)
MCHC: 35 g/dL (ref 30.0–36.0)
MCV: 80.7 fL (ref 78.0–100.0)
Monocytes Relative: 14 % — ABNORMAL HIGH (ref 3–12)
Neutrophils Relative %: 71 % (ref 43–77)
RBC: 4.5 MIL/uL (ref 4.22–5.81)

## 2013-03-20 LAB — CG4 I-STAT (LACTIC ACID): Lactic Acid, Venous: 2.97 mmol/L — ABNORMAL HIGH (ref 0.5–2.2)

## 2013-03-20 LAB — URINE MICROSCOPIC-ADD ON

## 2013-03-20 SURGERY — IRRIGATION AND DEBRIDEMENT EXTREMITY
Anesthesia: General | Site: Arm Lower | Laterality: Right | Wound class: Dirty or Infected

## 2013-03-20 MED ORDER — ONDANSETRON HCL 4 MG/2ML IJ SOLN
4.0000 mg | Freq: Once | INTRAMUSCULAR | Status: DC
  Filled 2013-03-20: qty 2

## 2013-03-20 MED ORDER — HYDROMORPHONE HCL PF 2 MG/ML IJ SOLN
2.0000 mg | Freq: Once | INTRAMUSCULAR | Status: AC
  Administered 2013-03-20: 2 mg via INTRAMUSCULAR
  Filled 2013-03-20: qty 1

## 2013-03-20 MED ORDER — ADULT MULTIVITAMIN W/MINERALS CH
1.0000 | ORAL_TABLET | Freq: Every day | ORAL | Status: DC
  Administered 2013-03-21: 1 via ORAL
  Filled 2013-03-20: qty 1

## 2013-03-20 MED ORDER — SODIUM CHLORIDE 0.9 % IJ SOLN: 3.0000 mL | Freq: Two times a day (BID) | INTRAMUSCULAR | Status: DC

## 2013-03-20 MED ORDER — SODIUM CHLORIDE 0.9 % IV SOLN
1000.0000 mL | Freq: Once | INTRAVENOUS | Status: AC
  Administered 2013-03-20: 1000 mL via INTRAVENOUS

## 2013-03-20 MED ORDER — HYDROMORPHONE HCL PF 1 MG/ML IJ SOLN
1.0000 mg | INTRAMUSCULAR | Status: DC | PRN
  Administered 2013-03-20 – 2013-03-21 (×5): 1 mg via INTRAVENOUS
  Filled 2013-03-20 (×5): qty 1

## 2013-03-20 MED ORDER — SODIUM CHLORIDE 0.9 % IV SOLN: 1000.0000 mL | INTRAVENOUS | Status: DC

## 2013-03-20 MED ORDER — CLINDAMYCIN PHOSPHATE 600 MG/50ML IV SOLN
600.0000 mg | Freq: Three times a day (TID) | INTRAVENOUS | Status: DC
  Administered 2013-03-20 – 2013-03-21 (×3): 600 mg via INTRAVENOUS
  Filled 2013-03-20 (×4): qty 50

## 2013-03-20 MED ORDER — INSULIN ASPART 100 UNIT/ML ~~LOC~~ SOLN
0.0000 [IU] | SUBCUTANEOUS | Status: DC
Start: 2013-03-20 — End: 2013-03-21
  Administered 2013-03-20: 23:00:00 3 [IU] via SUBCUTANEOUS
  Administered 2013-03-21 (×2): 2 [IU] via SUBCUTANEOUS
  Administered 2013-03-21: 5 [IU] via SUBCUTANEOUS
  Administered 2013-03-21: 05:00:00 3 [IU] via SUBCUTANEOUS

## 2013-03-20 MED ORDER — SODIUM CHLORIDE 0.9 % IV SOLN
INTRAVENOUS | Status: DC
  Administered 2013-03-20 – 2013-03-21 (×3): via INTRAVENOUS

## 2013-03-20 SURGICAL SUPPLY — 51 items
BANDAGE CONFORM 2  STR LF (GAUZE/BANDAGES/DRESSINGS) IMPLANT
BANDAGE ELASTIC 3 VELCRO ST LF (GAUZE/BANDAGES/DRESSINGS) ×2 IMPLANT
BANDAGE ELASTIC 4 VELCRO ST LF (GAUZE/BANDAGES/DRESSINGS) ×2 IMPLANT
BANDAGE GAUZE ELAST BULKY 4 IN (GAUZE/BANDAGES/DRESSINGS) ×2 IMPLANT
BNDG CMPR 9X4 STRL LF SNTH (GAUZE/BANDAGES/DRESSINGS)
BNDG ESMARK 4X9 LF (GAUZE/BANDAGES/DRESSINGS) IMPLANT
CLOTH BEACON ORANGE TIMEOUT ST (SAFETY) ×2 IMPLANT
CORDS BIPOLAR (ELECTRODE) IMPLANT
COVER SURGICAL LIGHT HANDLE (MISCELLANEOUS) ×4 IMPLANT
CUFF TOURNIQUET SINGLE 18IN (TOURNIQUET CUFF) ×2 IMPLANT
DECANTER SPIKE VIAL GLASS SM (MISCELLANEOUS) ×2 IMPLANT
DRAIN PENROSE 1/4X12 LTX STRL (WOUND CARE) IMPLANT
DRAPE OEC MINIVIEW 54X84 (DRAPES) IMPLANT
DRAPE SURG 17X23 STRL (DRAPES) ×2 IMPLANT
DRSG PAD ABDOMINAL 8X10 ST (GAUZE/BANDAGES/DRESSINGS) ×4 IMPLANT
DURAPREP 26ML APPLICATOR (WOUND CARE) IMPLANT
ELECT REM PT RETURN 9FT ADLT (ELECTROSURGICAL)
ELECTRODE REM PT RTRN 9FT ADLT (ELECTROSURGICAL) IMPLANT
GAUZE PACKING IODOFORM 1/4X5 (PACKING) ×1 IMPLANT
GAUZE XEROFORM 1X8 LF (GAUZE/BANDAGES/DRESSINGS) ×2 IMPLANT
GLOVE BIO SURGEON STRL SZ8.5 (GLOVE) ×2 IMPLANT
GOWN PREVENTION PLUS XLARGE (GOWN DISPOSABLE) ×2 IMPLANT
GOWN STRL NON-REIN LRG LVL3 (GOWN DISPOSABLE) ×2 IMPLANT
HANDPIECE INTERPULSE COAX TIP (DISPOSABLE)
KIT BASIN OR (CUSTOM PROCEDURE TRAY) ×2 IMPLANT
KIT ROOM TURNOVER OR (KITS) ×2 IMPLANT
MANIFOLD NEPTUNE II (INSTRUMENTS) ×2 IMPLANT
NDL HYPO 25X1 1.5 SAFETY (NEEDLE) ×1 IMPLANT
NEEDLE HYPO 25GX1X1/2 BEV (NEEDLE) IMPLANT
NEEDLE HYPO 25X1 1.5 SAFETY (NEEDLE) ×2 IMPLANT
NS IRRIG 1000ML POUR BTL (IV SOLUTION) ×2 IMPLANT
PACK ORTHO EXTREMITY (CUSTOM PROCEDURE TRAY) ×2 IMPLANT
PAD ARMBOARD 7.5X6 YLW CONV (MISCELLANEOUS) ×4 IMPLANT
PAD CAST 4YDX4 CTTN HI CHSV (CAST SUPPLIES) ×2 IMPLANT
PADDING CAST ABS 4INX4YD NS (CAST SUPPLIES) ×1
PADDING CAST ABS COTTON 4X4 ST (CAST SUPPLIES) IMPLANT
PADDING CAST COTTON 4X4 STRL (CAST SUPPLIES) ×4
SET HNDPC FAN SPRY TIP SCT (DISPOSABLE) IMPLANT
SPONGE GAUZE 4X4 12PLY (GAUZE/BANDAGES/DRESSINGS) ×3 IMPLANT
SPONGE LAP 18X18 X RAY DECT (DISPOSABLE) ×2 IMPLANT
SUCTION FRAZIER TIP 10 FR DISP (SUCTIONS) ×2 IMPLANT
SUT VICRYL RAPIDE 4/0 PS 2 (SUTURE) IMPLANT
SYR 20CC LL (SYRINGE) IMPLANT
SYR CONTROL 10ML LL (SYRINGE) ×2 IMPLANT
TOWEL OR 17X24 6PK STRL BLUE (TOWEL DISPOSABLE) ×2 IMPLANT
TOWEL OR 17X26 10 PK STRL BLUE (TOWEL DISPOSABLE) ×2 IMPLANT
TUBE ANAEROBIC SPECIMEN COL (MISCELLANEOUS) IMPLANT
TUBE CONNECTING 12X1/4 (SUCTIONS) ×2 IMPLANT
UNDERPAD 30X30 INCONTINENT (UNDERPADS AND DIAPERS) ×2 IMPLANT
WATER STERILE IRR 1000ML POUR (IV SOLUTION) ×2 IMPLANT
YANKAUER SUCT BULB TIP NO VENT (SUCTIONS) ×2 IMPLANT

## 2013-03-20 NOTE — ED Notes (Signed)
Assumed pt care.  Zofran 4mg  IV given by Charna Elizabeth, not scanned.  Pt alert and appropriate, awaiting OR call to take him to surgery.  Pain 3/10 s/p dilaudid.

## 2013-03-20 NOTE — ED Notes (Signed)
Pt c/o abscess to right forearm from IV drug use x 5 days; swelling and redness noted; pt sts injecting heroin

## 2013-03-20 NOTE — ED Notes (Signed)
Attempted to acces vein for blod draw and Iv site. Pt is a current IV drug user . This has caused scarring thru out his veins. A.Harris PA informed and voices understanding. Lab tech called to draw labs. Lab informed Pt is a possible code sepsis.

## 2013-03-20 NOTE — ED Provider Notes (Signed)
CSN: [id number]     Arrival date & time 03/20/13  1124 History   First MD Initiated Contact with Patient 03/20/13 1504     Chief Complaint  Patient presents with  . Abscess   (Consider location/radiation/quality/duration/timing/severity/associated sxs/prior Treatment) Patient is a 52 y.o. male presenting with abscess.  Abscess Location:  Shoulder/arm Shoulder/arm abscess location:  R forearm Size:  10 cm Abscess quality: fluctuance, induration, painful and warmth   Red streaking: no   Duration:  1 week Progression:  Worsening Pain details:    Quality:  Aching and burning   Past Medical History  Diagnosis Date  . Heroin abuse   . Diabetes mellitus without complication    History reviewed. No pertinent past surgical history. History reviewed. No pertinent family history. History  Substance Use Topics  . Smoking status: Current Every Day Smoker  . Smokeless tobacco: Not on file  . Alcohol Use: Yes    Review of Systems  Allergies  Review of patient's allergies indicates no known allergies.  Home Medications   Current Outpatient Rx  Name  Route  Sig  Dispense  Refill  . glipiZIDE (GLUCOTROL) 5 MG tablet   Oral   Take 5 mg by mouth 2 (two) times daily before a meal.         . metFORMIN (GLUCOPHAGE) 500 MG tablet   Oral   Take 500 mg by mouth 2 (two) times daily with a meal.         . Multiple Vitamin (MULTIVITAMIN WITH MINERALS) TABS tablet   Oral   Take 1 tablet by mouth daily.          BP 143/81  Pulse 128  Temp(Src) 97.4 F (36.3 C) (Oral)  Resp 18  SpO2 99% Physical Exam  Nursing note and vitals reviewed. Constitutional: He appears well-developed. No distress.  Very thin   HENT:  Head: Normocephalic and atraumatic.  Eyes: Conjunctivae are normal. No scleral icterus.  Neck: Normal range of motion. Neck supple.  Cardiovascular: Normal rate, regular rhythm and normal heart sounds.   Pulmonary/Chest: Effort normal and breath sounds normal. No  respiratory distress.  Abdominal: Soft. There is no tenderness.  Musculoskeletal: He exhibits no edema.  Neurological: He is alert.  Skin: He is not diaphoretic.  10 cm red, hot, fluctuant absces of the R forearm. BL track marks of forearms  Psychiatric: His behavior is normal.    ED Course  Procedures (including critical care time) Labs Review Labs Reviewed  URINALYSIS, ROUTINE W REFLEX MICROSCOPIC - Abnormal; Notable for the following:    Glucose, UA >1000 (*)    All other components within normal limits  URINE MICROSCOPIC-ADD ON - Abnormal; Notable for the following:    Squamous Epithelial / LPF FEW (*)    All other components within normal limits  URINE CULTURE  CBC WITH DIFFERENTIAL  COMPREHENSIVE METABOLIC PANEL   Imaging Review No results found.  MDM  No diagnosis found. 4:12 PM BP 143/81  Pulse 128  Temp(Src) 97.4 F (36.3 C) (Oral)  Resp 18  SpO2 99% Patient with large Abscess of the R forearm. I have consulted with Hand surgery who wil take the patient to the OR for I&D at 8:30 PM .   Patient has elevated lactate. Hyperkalemia,  Repeat potassium  in process at  This time.  Patient with repeat potassium WNL. pateint with elevated lactate.  Will be admitted by Dr. Beacher May.   Arthor Captain, PA-C 03/22/13 0110

## 2013-03-20 NOTE — H&P (Addendum)
[facility] History and Physical  [name] ZOX:[id number] DOB: 1961-01-16 DOA: 03/20/2013  Referring physician: ED PCP: No primary provider on file.   Chief Complaint: Forearm abscess  HPI: Raymond Winters is a 52 y.o. male who presents to the ED with c/o abscess located on his R fore arm.  This has been present for the past 1 week and progressively worsening, no drainage.  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  Hand surgery has been consulted and plans to take the patient to the OR tonight within the next hour or so.  No systemic symptoms although he was initially tachycardic on presentation to ED and does have a mildly elevated WBC at 11.2k.  Hospitalist has been asked to observe patient post operatively.  Review of Systems: 12 systems reviewed and otherwise negative.  Past Medical History  Diagnosis Date  . Heroin abuse   . Diabetes mellitus without complication    History reviewed. No pertinent past surgical history. Social History:  reports that he has been smoking.  He does not have any smokeless tobacco history on file. He reports that  drinks alcohol. He reports that he uses illicit drugs (IV).   No Known Allergies  History reviewed. No pertinent family history.  Prior to Admission medications   Medication Sig Start Date End Date Taking? Authorizing Provider  glipiZIDE (GLUCOTROL) 5 MG tablet Take 5 mg by mouth 2 (two) times daily before a meal.   Yes Historical Provider, MD  metFORMIN (GLUCOPHAGE) 500 MG tablet Take 500 mg by mouth 2 (two) times daily with a meal.   Yes Historical Provider, MD  Multiple Vitamin (MULTIVITAMIN WITH MINERALS) TABS tablet Take 1 tablet by mouth daily.   Yes Historical Provider, MD   Physical Exam: Filed Vitals:   03/20/13 1945  BP: 101/70  Pulse: 107  Temp:   Resp: 10    General:  NAD, resting comfortably in bed Eyes: PEERLA EOMI ENT: mucous membranes moist Neck: supple w/o JVD Cardiovascular: RRR w/o MRG Respiratory: CTA B Abdomen:  soft, nt, nd, bs+ Skin: 10cm abscess to R forearm Musculoskeletal: MAE, full ROM all 4 extremities Psychiatric: normal tone and affect Neurologic: AAOx3, grossly non-focal  Labs on Admission:  Basic Metabolic Panel:  Recent Labs Lab 03/20/13 1645 03/20/13 1913  NA 130*  --   K 5.2* 4.1  CL 93*  --   CO2 23  --   GLUCOSE 319*  --   BUN 12  --   CREATININE 0.68  --   CALCIUM 9.4  --    Liver Function Tests:  Recent Labs Lab 03/20/13 1645  AST 23  ALT 17  ALKPHOS 91  BILITOT 0.4  PROT 7.6  ALBUMIN 3.3*   No results found for this basename: LIPASE, AMYLASE,  in the last 168 hours No results found for this basename: AMMONIA,  in the last 168 hours CBC:  Recent Labs Lab 03/20/13 1645  WBC 11.8*  NEUTROABS 8.4*  HGB 12.7*  HCT 36.3*  MCV 80.7  PLT 143*   Cardiac Enzymes: No results found for this basename: CKTOTAL, CKMB, CKMBINDEX, TROPONINI,  in the last 168 hours  BNP (last 3 results) No results found for this basename: PROBNP,  in the last 8760 hours CBG:  Recent Labs Lab 03/20/13 1629 03/20/13 1958  GLUCAP 308* 249*    Radiological Exams on Admission: No results found.  EKG: Independently reviewed.  Assessment/Plan Principal Problem:   Cellulitis and abscess of upper arm and forearm Active  Problems:   DM2 (diabetes mellitus, type 2)   1. Cellulitis with abscess of forearm - on right forearm, patient headed to OR very shortly for I+D, put order in for culture of surgical drainage, continue clinda empirically.  Will tele monitor since he was having tachycardia initially in ED, does not appear toxic or truly septic at this point in time.  IVF, monitor post op, if no complications overnight may consider DC on clindamycin tomorrow.  Dilaudid ordered for pain but may need to increase dose as he is likely highly tolerant of opiods. 2. DM2 - SSI q4h, switch to home meds after eating post operatively 3. Hyperkalemia on initial labs - repeat was normal  at 4.1    Code Status: Full Code (must indicate code status--if unknown or must be presumed, indicate so) Family Communication: Spoke with wife at bedside (indicate person spoken with, if applicable, with phone number if by telephone) Disposition Plan: Admit to obs (indicate anticipated LOS)  Time spent: 50 min  Cherene Dobbins M. [facility] Pager [phone]  If 7PM-7AM, please contact night-coverage [email or web address] Password [email or web address] 03/20/2013, 9:20 PM

## 2013-03-20 NOTE — ED Notes (Signed)
OR to be ready for pt in approx 45 minutes.

## 2013-03-20 NOTE — Consult Note (Signed)
Reason for Consult:right forearm abscess Referring Physician: Ross Marcus Valin is an 52 y.o. male.  HPI: s/p injection of IV drugs now with forearm abscess  Past Medical History  Diagnosis Date  . Heroin abuse   . Diabetes mellitus without complication     History reviewed. No pertinent past surgical history.  History reviewed. No pertinent family history.  Social History:  reports that he has been smoking.  He does not have any smokeless tobacco history on file. He reports that  drinks alcohol. He reports that he uses illicit drugs (IV).  Allergies: No Known Allergies  Medications: Scheduled:   Results for orders placed during the hospital encounter of 03/20/13 (from the past 48 hour(s))  URINALYSIS, ROUTINE W REFLEX MICROSCOPIC     Status: Abnormal   Collection Time    03/20/13  3:34 PM      Result Value Range   Color, Urine YELLOW  YELLOW   APPearance CLEAR  CLEAR   Specific Gravity, Urine 1.025  1.005 - 1.030   pH 5.0  5.0 - 8.0   Glucose, UA >1000 (*) NEGATIVE mg/dL   Hgb urine dipstick NEGATIVE  NEGATIVE   Bilirubin Urine NEGATIVE  NEGATIVE   Ketones, ur NEGATIVE  NEGATIVE mg/dL   Protein, ur NEGATIVE  NEGATIVE mg/dL   Urobilinogen, UA 1.0  0.0 - 1.0 mg/dL   Nitrite NEGATIVE  NEGATIVE   Leukocytes, UA NEGATIVE  NEGATIVE  URINE MICROSCOPIC-ADD ON     Status: Abnormal   Collection Time    03/20/13  3:34 PM      Result Value Range   Squamous Epithelial / LPF FEW (*) RARE   WBC, UA 0-2  <3 WBC/hpf  GLUCOSE, CAPILLARY     Status: Abnormal   Collection Time    03/20/13  4:29 PM      Result Value Range   Glucose-Capillary 308 (*) 70 - 99 mg/dL  CBC WITH DIFFERENTIAL     Status: Abnormal   Collection Time    03/20/13  4:45 PM      Result Value Range   WBC 11.8 (*) 4.0 - 10.5 K/uL   RBC 4.50  4.22 - 5.81 MIL/uL   Hemoglobin 12.7 (*) 13.0 - 17.0 g/dL   HCT 16.1 (*) 09.6 - 04.5 %   MCV 80.7  78.0 - 100.0 fL   MCH 28.2  26.0 - 34.0 pg   MCHC 35.0  30.0 -  36.0 g/dL   RDW 40.9  81.1 - 91.4 %   Platelets 143 (*) 150 - 400 K/uL   Neutrophils Relative % 71  43 - 77 %   Neutro Abs 8.4 (*) 1.7 - 7.7 K/uL   Lymphocytes Relative 14  12 - 46 %   Lymphs Abs 1.7  0.7 - 4.0 K/uL   Monocytes Relative 14 (*) 3 - 12 %   Monocytes Absolute 1.6 (*) 0.1 - 1.0 K/uL   Eosinophils Relative 1  0 - 5 %   Eosinophils Absolute 0.1  0.0 - 0.7 K/uL   Basophils Relative 0  0 - 1 %   Basophils Absolute 0.0  0.0 - 0.1 K/uL  CG4 I-STAT (LACTIC ACID)     Status: Abnormal   Collection Time    03/20/13  4:56 PM      Result Value Range   Lactic Acid, Venous 2.97 (*) 0.5 - 2.2 mmol/L    No results found.  Review of Systems  All other systems reviewed and  are negative.   Blood pressure 143/81, pulse 128, temperature 97.4 F (36.3 C), temperature source Oral, resp. rate 18, SpO2 99.00%. Physical Exam  Constitutional: He is oriented to person, place, and time. He appears well-developed and well-nourished.  HENT:  Head: Normocephalic and atraumatic.  Cardiovascular: Normal rate.   Respiratory: Effort normal.  Musculoskeletal:       Right forearm: He exhibits tenderness, swelling and edema.  Right midshaft dorsal forearm abscess  Neurological: He is alert and oriented to person, place, and time.  Skin: Skin is warm. There is erythema.  Psychiatric: He has a normal mood and affect. His behavior is normal. Judgment and thought content normal.    Assessment/Plan: As above  Plan I and D  Turrell Severt A 03/20/2013, 5:27 PM

## 2013-03-20 NOTE — ED Notes (Signed)
CBG 308 

## 2013-03-20 NOTE — ED Notes (Signed)
Pt's CBG 249 RN Notified

## 2013-03-20 NOTE — ED Notes (Signed)
Iv team contacted and will come to assess for IV site.

## 2013-03-21 ENCOUNTER — Encounter (HOSPITAL_COMMUNITY): Payer: Self-pay | Admitting: Anesthesiology

## 2013-03-21 ENCOUNTER — Encounter (HOSPITAL_COMMUNITY): Payer: Self-pay | Admitting: Orthopedic Surgery

## 2013-03-21 ENCOUNTER — Observation Stay (HOSPITAL_COMMUNITY): Payer: Non-veteran care | Admitting: Anesthesiology

## 2013-03-21 DIAGNOSIS — L0291 Cutaneous abscess, unspecified: Secondary | ICD-10-CM

## 2013-03-21 LAB — CBC
HCT: 31.2 % — ABNORMAL LOW (ref 39.0–52.0)
Hemoglobin: 10.8 g/dL — ABNORMAL LOW (ref 13.0–17.0)
MCH: 27.8 pg (ref 26.0–34.0)
MCHC: 34.6 g/dL (ref 30.0–36.0)
MCV: 80.4 fL (ref 78.0–100.0)
Platelets: 153 10*3/uL (ref 150–400)
RBC: 3.88 MIL/uL — ABNORMAL LOW (ref 4.22–5.81)
RDW: 13 % (ref 11.5–15.5)
WBC: 8.2 10*3/uL (ref 4.0–10.5)

## 2013-03-21 LAB — GLUCOSE, CAPILLARY
Glucose-Capillary: 118 mg/dL — ABNORMAL HIGH (ref 70–99)
Glucose-Capillary: 180 mg/dL — ABNORMAL HIGH (ref 70–99)
Glucose-Capillary: 184 mg/dL — ABNORMAL HIGH (ref 70–99)
Glucose-Capillary: 249 mg/dL — ABNORMAL HIGH (ref 70–99)

## 2013-03-21 LAB — URINE CULTURE
Colony Count: NO GROWTH
Culture: NO GROWTH
Special Requests: NORMAL

## 2013-03-21 LAB — BASIC METABOLIC PANEL
BUN: 8 mg/dL (ref 6–23)
CO2: 27 mEq/L (ref 19–32)
Calcium: 8.3 mg/dL — ABNORMAL LOW (ref 8.4–10.5)
Chloride: 98 mEq/L (ref 96–112)
Creatinine, Ser: 0.67 mg/dL (ref 0.50–1.35)
GFR calc Af Amer: >90 mL/min (ref >90)
GFR calc non Af Amer: >90 mL/min (ref >90)
Glucose, Bld: 218 mg/dL — ABNORMAL HIGH (ref 70–99)
Potassium: 3.7 mEq/L (ref 3.5–5.1)
Sodium: 134 mEq/L — ABNORMAL LOW (ref 135–145)

## 2013-03-21 LAB — RPR TITER: RPR Titer: 1:1 {titer}

## 2013-03-21 LAB — GRAM STAIN

## 2013-03-21 LAB — HIV ANTIBODY (ROUTINE TESTING W REFLEX): HIV: NONREACTIVE

## 2013-03-21 LAB — RPR: RPR Ser Ql: REACTIVE — AB

## 2013-03-21 MED ORDER — OXYCODONE-ACETAMINOPHEN 10-325 MG PO TABS: 1.0000 | ORAL_TABLET | ORAL | Status: DC | PRN

## 2013-03-21 MED ORDER — LOPERAMIDE HCL 2 MG PO CAPS: 2.0000 mg | ORAL_CAPSULE | ORAL | Status: DC | PRN

## 2013-03-21 MED ORDER — BUPIVACAINE HCL (PF) 0.25 % IJ SOLN
INTRAMUSCULAR | Status: AC
  Filled 2013-03-21: qty 30

## 2013-03-21 MED ORDER — VANCOMYCIN HCL 10 G IV SOLR
1250.0000 mg | Freq: Two times a day (BID) | INTRAVENOUS | Status: DC
  Administered 2013-03-21: 1250 mg via INTRAVENOUS
  Filled 2013-03-21 (×4): qty 1250

## 2013-03-21 MED ORDER — HYDROXYZINE HCL 25 MG PO TABS: 25.0000 mg | ORAL_TABLET | Freq: Four times a day (QID) | ORAL | Status: DC | PRN

## 2013-03-21 MED ORDER — NAPROXEN 500 MG PO TABS
500.0000 mg | ORAL_TABLET | Freq: Two times a day (BID) | ORAL | Status: DC | PRN
  Administered 2013-03-21: 500 mg via ORAL
  Filled 2013-03-21: qty 1

## 2013-03-21 MED ORDER — HYDROMORPHONE HCL PF 1 MG/ML IJ SOLN: 0.2500 mg | INTRAMUSCULAR | Status: DC | PRN

## 2013-03-21 MED ORDER — OXYCODONE HCL 5 MG/5ML PO SOLN: 5.0000 mg | Freq: Once | ORAL | Status: AC | PRN

## 2013-03-21 MED ORDER — CLONIDINE HCL 0.2 MG PO TABS
0.2000 mg | ORAL_TABLET | ORAL | Status: DC | PRN
  Filled 2013-03-21: qty 1

## 2013-03-21 MED ORDER — FENTANYL CITRATE 0.05 MG/ML IJ SOLN
INTRAMUSCULAR | Status: DC | PRN
  Administered 2013-03-21 (×5): 50 ug via INTRAVENOUS

## 2013-03-21 MED ORDER — SULFAMETHOXAZOLE-TMP DS 800-160 MG PO TABS: 1.0000 | ORAL_TABLET | Freq: Two times a day (BID) | ORAL | Status: DC

## 2013-03-21 MED ORDER — ONDANSETRON 4 MG PO TBDP
4.0000 mg | ORAL_TABLET | Freq: Four times a day (QID) | ORAL | Status: DC | PRN
  Filled 2013-03-21: qty 1

## 2013-03-21 MED ORDER — PIPERACILLIN-TAZOBACTAM 3.375 G IVPB
3.3750 g | Freq: Three times a day (TID) | INTRAVENOUS | Status: DC
  Administered 2013-03-21: 15:00:00 3.375 g via INTRAVENOUS
  Filled 2013-03-21 (×3): qty 50

## 2013-03-21 MED ORDER — ONDANSETRON HCL 4 MG/2ML IJ SOLN
INTRAMUSCULAR | Status: DC | PRN
  Administered 2013-03-21: 4 mg via INTRAVENOUS

## 2013-03-21 MED ORDER — ENSURE COMPLETE PO LIQD: 237.0000 mL | Freq: Two times a day (BID) | ORAL | Status: DC

## 2013-03-21 MED ORDER — METHOCARBAMOL 500 MG PO TABS
500.0000 mg | ORAL_TABLET | Freq: Three times a day (TID) | ORAL | Status: DC | PRN
  Administered 2013-03-21: 500 mg via ORAL
  Filled 2013-03-21: qty 1

## 2013-03-21 MED ORDER — PROPOFOL 10 MG/ML IV BOLUS
INTRAVENOUS | Status: DC | PRN
  Administered 2013-03-21: 150 mg via INTRAVENOUS

## 2013-03-21 MED ORDER — CLONIDINE HCL 0.1 MG PO TABS
0.1000 mg | ORAL_TABLET | Freq: Four times a day (QID) | ORAL | Status: DC
  Administered 2013-03-21 (×2): 0.1 mg via ORAL
  Filled 2013-03-21 (×3): qty 1

## 2013-03-21 MED ORDER — OXYCODONE HCL 5 MG PO TABS
5.0000 mg | ORAL_TABLET | Freq: Once | ORAL | Status: AC | PRN
  Administered 2013-03-21: 5 mg via ORAL
  Filled 2013-03-21: qty 1

## 2013-03-21 MED ORDER — PIPERACILLIN-TAZOBACTAM 3.375 G IVPB 30 MIN
3.3750 g | Freq: Once | INTRAVENOUS | Status: AC
  Administered 2013-03-21: 3.375 g via INTRAVENOUS
  Filled 2013-03-21 (×2): qty 50

## 2013-03-21 MED ORDER — DICYCLOMINE HCL 20 MG PO TABS
20.0000 mg | ORAL_TABLET | Freq: Four times a day (QID) | ORAL | Status: DC | PRN
  Filled 2013-03-21: qty 1

## 2013-03-21 MED ORDER — CLONIDINE HCL 0.1 MG PO TABS: 0.1000 mg | ORAL_TABLET | Freq: Two times a day (BID) | ORAL | Status: DC

## 2013-03-21 MED ORDER — SODIUM CHLORIDE 0.9 % IR SOLN
Status: DC | PRN
  Administered 2013-03-21: 01:00:00 3000 mL

## 2013-03-21 MED ORDER — BUPIVACAINE HCL (PF) 0.25 % IJ SOLN
INTRAMUSCULAR | Status: DC | PRN
  Administered 2013-03-21: 10 mL

## 2013-03-21 MED ORDER — CLONIDINE HCL 0.1 MG PO TABS: 0.1000 mg | ORAL_TABLET | Freq: Every day | ORAL | Status: DC

## 2013-03-21 MED ORDER — MIDAZOLAM HCL 5 MG/5ML IJ SOLN
INTRAMUSCULAR | Status: DC | PRN
  Administered 2013-03-21 (×2): 1 mg via INTRAVENOUS

## 2013-03-21 MED ORDER — LACTATED RINGERS IV SOLN
INTRAVENOUS | Status: DC | PRN
  Administered 2013-03-21: via INTRAVENOUS

## 2013-03-21 NOTE — Op Note (Signed)
NAMEJARYD, DREW NO.:  [id number]  MEDICAL RECORD NO.:  [id number]  LOCATION:  5W09C                        FACILITY:  [facility]  PHYSICIAN:  Artist Pais. Pasha Broad, M.D.DATE OF BIRTH:  10-May-1961  DATE OF PROCEDURE:  03/21/2013 DATE OF DISCHARGE:  03/21/2013                              OPERATIVE REPORT   PREOPERATIVE DIAGNOSIS:  Deep abscess, right forearm, status post IV drug injection.  POSTOPERATIVE DIAGNOSIS:  Deep abscess, right forearm, status post IV drug injection.  PROCEDURE:  Incision and drainage of above.  SURGEON:  Artist Pais. Mina Marble, M.D.  ASSISTANT:  None.  ANESTHESIA:  General.  COMPLICATIONS:  None.  DRAINS:  None.  Wound packed open.  Cultures were sent x2 with stat Gram stain.  DESCRIPTION OF PROCEDURE:  The patient was taken to the operating suite. After induction of adequate general anesthesia, the right upper extremity was prepped and draped in usual sterile fashion.  The arm was elevated and then tourniquet was inflated to 250 mmHg.  At this point in time, 6-cm incision was made over the dorsoradial aspect of the midshaft of the forearm area.  Skin was incised.  Purulence was encountered and cultured.  Blunt dissection was carried down to the brachioradialis muscle and large vein, which had been thrombosed.  The wound was opened proximally and distally.  Purulence was evacuated and the wound was then irrigated with 6 mL of normal saline.  Once this was done, the wound was open openly packed with 0.25-inch Iodoform gauze, 4x4s, ABD and Kerlix cast padding, and Ace wrap.  The patient tolerated the procedure well, went to the recovery room in a stable fashion.     Artist Pais Mina Marble, M.D.     MAW/MEDQ  D:  03/21/2013  T:  03/21/2013  Job:  [id number]

## 2013-03-21 NOTE — Discharge Summary (Signed)
  I have directly reviewed the clinical findings, lab, imaging studies and management of this patient in detail. I have interviewed and examined the patient and agree with the documentation,  as recorded by the Physician extender.  Leroy Sea M.D on 03/21/2013 at 2:44 PM  [facility] Office  [phone]

## 2013-03-21 NOTE — Progress Notes (Signed)
INITIAL NUTRITION ASSESSMENT  DOCUMENTATION CODES Per approved criteria  -Severe malnutrition in the context of chronic illness   INTERVENTION: Ensure Complete po BID, each supplement provides 350 kcal and 13 grams of protein.  NUTRITION DIAGNOSIS: Malnutrition related to chronic illness as evidenced by severe fat and muscle wasting, 13% weight loss x 6 months and intake </= 75% of his needs for >/= 1 months.   Goal: Pt to meet >/= 90% of their estimated nutrition needs   Monitor:  PO intake, supplement acceptance, weight trend  Reason for Assessment: Pt identified as at nutrition risk on the Malnutrition Screen Tool  52 y.o. male  Admitting Dx: Cellulitis and abscess of upper arm and forearm  ASSESSMENT: Pt admitted with cellulitis. Pt with hx of IV drug use. Plans for d/c home today.  Per pt he has lost 13% of his body weight (150 to 130 lb) due to poor appetite, skipping meals.  Pt skips meals 3-4 times per week, likely due to drug use.   Nutrition Focused Physical Exam:  Subcutaneous Fat:  Orbital Region: WNL Upper Arm Region: mild-moderate wasting Thoracic and Lumbar Region: severe wasting  Muscle:  Temple Region: mild-moderate wasting Clavicle Bone Region: severe wasting Clavicle and Acromion Bone Region: mild-moderate wasting Scapular Bone Region: severe wasting Dorsal Hand: mild-moderate wasting Patellar Region: WNL Anterior Thigh Region: mild-moderate wasting Posterior Calf Region: WNL  Edema:  3+ edema in RUE   Height: Ht Readings from Last 1 Encounters:  03/21/13 5' 8.4" (1.737 m)    Weight: Wt Readings from Last 1 Encounters:  03/21/13 148 lb 4.8 oz (67.268 kg)    Ideal Body Weight: 70 kg   % Ideal Body Weight: 96%  Wt Readings from Last 10 Encounters:  03/21/13 148 lb 4.8 oz (67.268 kg)  03/21/13 148 lb 4.8 oz (67.268 kg)    Usual Body Weight: 150 lb   % Usual Body Weight: 99%  BMI:  Body mass index is 22.3 kg/(m^2).  Estimated  Nutritional Needs: Kcal: 2100-2300  Protein: 85-95 grams Fluid: >2.1 L/day  Skin: incision arm  Diet Order: Carb Control Meal Completion: 100%  EDUCATION NEEDS: -No education needs identified at this time   Intake/Output Summary (Last 24 hours) at 03/21/13 1138 Last data filed at 03/21/13 0935  Gross per 24 hour  Intake    962 ml  Output    410 ml  Net    552 ml    Last BM: 9/28   Labs:   Recent Labs Lab 03/20/13 1645 03/20/13 1913 03/21/13 0450  NA 130*  --  134*  K 5.2* 4.1 3.7  CL 93*  --  98  CO2 23  --  27  BUN 12  --  8  CREATININE 0.68  --  0.67  CALCIUM 9.4  --  8.3*  GLUCOSE 319*  --  218*    CBG (last 3)   Recent Labs  03/21/13 0129 03/21/13 0444 03/21/13 0729  GLUCAP 118* 231* 180*   No results found for this basename: HGBA1C   Scheduled Meds: . cloNIDine  0.1 mg Oral QID   Followed by  . [START ON 03/23/2013] cloNIDine  0.1 mg Oral BID   Followed by  . [START ON 03/26/2013] cloNIDine  0.1 mg Oral QAC breakfast  . insulin aspart  0-9 Units Subcutaneous Q4H  . multivitamin with minerals  1 tablet Oral Daily  . ondansetron (ZOFRAN) IV  4 mg Intravenous Once  . piperacillin-tazobactam (ZOSYN)  IV  3.375 g Intravenous Q8H  . sodium chloride  3 mL Intravenous Q12H  . vancomycin  1,250 mg Intravenous Q12H    Continuous Infusions: . sodium chloride 50 mL/hr at 03/21/13 1045    Past Medical History  Diagnosis Date  . Heroin abuse   . Diabetes mellitus without complication     History reviewed. No pertinent past surgical history.  Kendell Bane RD, LDN, CNSC [phone] Pager [phone] After Hours Pager

## 2013-03-21 NOTE — Progress Notes (Signed)
Pt told to wait for the NT to come and wheel him down to his ride. When RN went to patients room to make tell him the NT will be right in the patient had already left.

## 2013-03-21 NOTE — Progress Notes (Signed)
Patient underwent I and D of abscess this AM  Will need packing pulled and bedside hydrotherapy starting 10/1  Please arrange outpatient daily dressing changes and followup in my office this thursday

## 2013-03-21 NOTE — Op Note (Signed)
See note [id number]

## 2013-03-21 NOTE — Progress Notes (Addendum)
ANTIBIOTIC CONSULT NOTE - INITIAL  Pharmacy Consult for Vancocin and Zosyn Indication: abscess  No Known Allergies  Patient Measurements: Height: 5' 8.4" (173.7 cm) Weight: 148 lb 4.8 oz (67.268 kg) IBW/kg (Calculated) : 69.32  Vital Signs: Temp: 98.9 F (37.2 C) (09/30 0545) Temp src: Oral (09/30 0545) BP: 112/76 mmHg (09/30 0545) Pulse Rate: 89 (09/30 0545) Intake/Output from previous day: 09/29 0701 - 09/30 0700 In: 400 [I.V.:400] Out: 410 [Urine:400; Blood:10] Intake/Output from this shift: Total I/O In: 400 [I.V.:400] Out: 410 [Urine:400; Blood:10]  Labs:  Recent Labs  03/20/13 1645 03/21/13 0450  WBC 11.8* 8.2  HGB 12.7* 10.8*  PLT 143* 153  CREATININE 0.68 0.67   Estimated Creatinine Clearance: 100.5 ml/min (by C-G formula based on Cr of 0.67).   Microbiology: Recent Results (from the past 720 hour(s))  SURGICAL PCR SCREEN     Status: None   Collection Time    03/20/13 10:40 PM      Result Value Range Status   MRSA, PCR NEGATIVE  NEGATIVE Final   Staphylococcus aureus NEGATIVE  NEGATIVE Final   Comment:            The Xpert SA Assay (FDA     approved for NASAL specimens     in patients over 43 years of age),     is one component of     a comprehensive surveillance     program.  Test performance has     been validated by [employer] for patients greater     than or equal to 30 year old.     It is not intended     to diagnose infection nor to     guide or monitor treatment.  GRAM STAIN     Status: None   Collection Time    03/21/13  5:00 AM      Result Value Range Status   Specimen Description ABSCESS RIGHT ARM   Final   Special Requests PATIENT ON FOLLOWING CLINDAMYCIN   Final   Gram Stain     Final   Value: FEW WBC PRESENT, PREDOMINANTLY MONONUCLEAR     MODERATE GRAM POSITIVE COCCI IN DIPLOS     ABUNDANT GRAM NEGATIVE RODS     CALLED TO,READ BACK BY AND VERIFIED WITH  A MOHAMMI(RN) @0550  [place] 03/21/13   Report Status 03/21/2013  FINAL   Final    Medical History: Past Medical History  Diagnosis Date  . Heroin abuse   . Diabetes mellitus without complication     Medications:  Prescriptions prior to admission  Medication Sig Dispense Refill  . glipiZIDE (GLUCOTROL) 5 MG tablet Take 5 mg by mouth 2 (two) times daily before a meal.      . metFORMIN (GLUCOPHAGE) 500 MG tablet Take 500 mg by mouth 2 (two) times daily with a meal.      . Multiple Vitamin (MULTIVITAMIN WITH MINERALS) TABS tablet Take 1 tablet by mouth daily.       Scheduled:  . clindamycin (CLEOCIN) IV  600 mg Intravenous Q8H  . insulin aspart  0-9 Units Subcutaneous Q4H  . multivitamin with minerals  1 tablet Oral Daily  . ondansetron (ZOFRAN) IV  4 mg Intravenous Once  . sodium chloride  3 mL Intravenous Q12H   Infusions:  . sodium chloride    . sodium chloride 125 mL/hr at 03/20/13 2217    Assessment: 52yo male w/ UE abscess, now s/p I&D, gram stain reveals GPC,  GNR, to broaden ABX.  Goal of Therapy:  Vancomycin trough level 10-15 mcg/ml  Plan:  Will begin vancomycin 1250mg  IV Q12H and Zosyn 3.375g IV Q8H and monitor CBC, Cx, levels prn.  Vernard Gambles, PharmD, BCPS  03/21/2013,6:50 AM

## 2013-03-21 NOTE — Care Management Note (Signed)
    Page 1 of 1   03/21/2013     4:37:07 PM   CARE MANAGEMENT NOTE 03/21/2013  Patient:  Raymond Winters,Raymond Winters   Account Number:  [id number]  Date Initiated:  03/21/2013  Documentation initiated by:  Letha Cape  Subjective/Objective Assessment:   dx cellulitis and abscess of upper arm  admit as observation- lives with girlfriend.     Action/Plan:   HHRN for wound care   Anticipated DC Date:  03/21/2013   Anticipated DC Plan:  HOME W HOME HEALTH SERVICES      DC Planning Services  CM consult      Choice offered to / List presented to:  C-1 Patient        HH arranged  HH-1 RN      [facility] agency  [facility]   Status of service:  Completed, signed off Medicare Important Message given?   (If response is "NO", the following Medicare IM given date fields will be blank) Date Medicare IM given:   Date Additional Medicare IM given:    Discharge Disposition:  HOME W HOME HEALTH SERVICES  Per UR Regulation:  Reviewed for med. necessity/level of care/duration of stay  If discussed at Long Length of Stay Meetings, dates discussed:    Comments:  03/21/13 16:34 Letha Cape RN, BSN [phone] patient lives with girlfriend, patient will need hhrn for wound care and med management, patient went with [facility], referral made to [facility], Lupita Leash notified.  patient qualified for charity case.  HHRN will teach girlfriend how to do dressing change.  Patient has an appt at [facility] wound care and ID clinice.  Patient for dc today, patient will be on bactrim which is on the $4 list and percocet.

## 2013-03-21 NOTE — Clinical Social Work Psychosocial (Signed)
Clinical Social Work Department BRIEF PSYCHOSOCIAL ASSESSMENT 03/21/2013  Patient:  Raymond Winters, Raymond Winters     Account Number:  [id number]     Admit date:  03/20/2013  Clinical Social Worker:  Lavell Luster  Date/Time:  03/21/2013 11:47 AM  Referred by:  Physician  Date Referred:  03/21/2013 Referred for  SNF Placement   Other Referral:   Interview type:  Patient Other interview type:    PSYCHOSOCIAL DATA Living Status:  ALONE Admitted from facility:   Level of care:   Primary support name:   Primary support relationship to patient:   Degree of support available:   Support is limited.    CURRENT CONCERNS Current Concerns  Post-Acute Placement   Other Concerns:    SOCIAL WORK ASSESSMENT / PLAN CSW met with patient to discuss patient's substance use and to offer substance. Patient has a long history of IV drug use. Patient reports that he uses heroin at least one time a day. Patient expressed interest in SA treatment. CSW gave patient NA meeting list/schedule and list of local treatment facilities. Patient has participated in NA meetings and treatment in the past and states that he has had some success (3-4 years of sobriety). Patient plans to look into inpatient substancce abuse program with [facility]. Patient is interested in Methadone maintainence but states that he cannot afford this form of treatment.   Assessment/plan status:  No Further Intervention Required Other assessment/ plan:   NA   Information/referral to community resources:   Patient given treatment facility list and NA meeting schedule    PATIENT'S/FAMILY'S RESPONSE TO PLAN OF CARE: Patient plans to look into [facility] [place] for inpatient SA treatment. Patient was pleasant and appreciative of CSW contact. Patient accept resources offered by CSW.     Raymond Winters, Raymond Winters, Raymond Winters, [phone]

## 2013-03-21 NOTE — Preoperative (Signed)
Beta Blockers   Reason not to administer Beta Blockers:Not Applicable 

## 2013-03-21 NOTE — Anesthesia Postprocedure Evaluation (Signed)
  Anesthesia Post-op Note  Patient: Raymond Winters  Procedure(s) Performed: Procedure(s): IRRIGATION AND DEBRIDEMENT EXTREMITY (Right)  Patient Location: PACU  Anesthesia Type:General  Level of Consciousness: awake and alert   Airway and Oxygen Therapy: Patient Spontanous Breathing  Post-op Pain: moderate  Post-op Assessment: Post-op Vital signs reviewed, Patient's Cardiovascular Status Stable, Respiratory Function Stable, Patent Airway and No signs of Nausea or vomiting  Post-op Vital Signs: Reviewed and stable  Complications: No apparent anesthesia complications

## 2013-03-21 NOTE — Anesthesia Procedure Notes (Signed)
Procedure Name: LMA Insertion Date/Time: 03/21/2013 12:36 AM Performed by: Julianne Rice Z Pre-anesthesia Checklist: Patient identified, Timeout performed, Emergency Drugs available, Suction available and Patient being monitored Patient Re-evaluated:Patient Re-evaluated prior to inductionOxygen Delivery Method: Circle system utilized Preoxygenation: Pre-oxygenation with 100% oxygen Intubation Type: IV induction Ventilation: Mask ventilation without difficulty LMA: LMA inserted Tube type: Oral Number of attempts: 1 Tube secured with: Tape Dental Injury: Teeth and Oropharynx as per pre-operative assessment

## 2013-03-21 NOTE — Progress Notes (Signed)
Pt. discharged to home. Pt after visit summary reviewed and pt capable of re verbalizing medications and follow up appointments. Pt remains stable. No signs and symptoms of distress. Educated to return to ER in the event of SOB, dizziness, chest pain, or fainting. Everitt Wenner E  

## 2013-03-21 NOTE — Transfer of Care (Signed)
Immediate Anesthesia Transfer of Care Note  Patient: Raymond Winters  Procedure(s) Performed: Procedure(s): IRRIGATION AND DEBRIDEMENT EXTREMITY (Right)  Patient Location: PACU  Anesthesia Type:General  Level of Consciousness: awake, alert , oriented and patient cooperative  Airway & Oxygen Therapy: Patient Spontanous Breathing  Post-op Assessment: Report given to PACU RN and Post -op Vital signs reviewed and stable  Post vital signs: Reviewed and stable  Complications: No apparent anesthesia complications

## 2013-03-21 NOTE — Progress Notes (Addendum)
[facility] PROGRESS NOTE  Raymond Winters YQM:[id number] DOB: July 28, 1960 DOA: 03/20/2013 PCP: No primary provider on file.   HPI/Subjective: Pt. Doing well with minor complaints of right arm pain.  Reports pain medications do help but do not last long.  Assessment/Plan:  Cellulitis/Abscess of Right forearm w/ I&D: -Likely from IV drug use. -Ortho I&D at 1am on 9/30 by Dr. Mina Marble-- see op-note -Wound packed and in compression wrap -Minor hand edema likely due to compression wrap -Vanc + Clinda started empirically in ED -D/c Clinda and started Zosyn due to culture results of Gram + and Gram - growth. -Con't IV Abx at this time -Consult with ID for appropriate inpatient/outpatient ABx therapy  DM -Pt. Reported at home glucose is ~150. -Well controlled on at home medications -Insulin sliding scale-sensitive while inpatient -Daily CBG monitoring - Hgb A1c pending  IV Drug Use: -Pt. Counseled on risks involved with IVD use -Pt. Reported interest in beginning inpatient rehab through [facility] program  -Social Work consulted for education and assistance with rehab programs -Clonidine PRN for withdraw symptoms.   Code Status: Full Family Communication: Spoke with niece at bedside; RN spoke with mother during rounds. Disposition Plan: Discharge pending ID consult for appropriate inpatient/outpatient Abx therapy.    Consultants:  Ortho-- Dr. Mina Marble  ID-- Dr. Ninetta Lights  Procedures:  I & D of right forearm-- See Ortho note  Antibiotics:  Vancomycin 9/29  Clindamycin 9/29 >> 9/30; d/c and changed to Zosyn due to culture results  Zosyn 9/30   Objective: Filed Vitals:   03/21/13 0545  BP: 112/76  Pulse: 89  Temp: 98.9 F (37.2 C)  Resp: 17    Intake/Output Summary (Last 24 hours) at 03/21/13 0949 Last data filed at 03/21/13 0546  Gross per 24 hour  Intake    400 ml  Output    410 ml  Net    -10 ml   Filed Weights   03/21/13 0305  Weight: 67.268  kg (148 lb 4.8 oz)    Exam:   General:  Well appearing, AAO x 3  Cardiovascular: RRR, no m/g/r, minor right hand edema likely due to compression wrap; cap refill is 1+; distal pulses intact  Respiratory: CTAB, no wheezes, rales, rhonchi  Abdomen: Soft, non tender, no palpable masses, no distention or organomegaly  Musculoskeletal: Right arm in compression wrap with gauze dressing.  Limited elbow ROM due to wrap.  Right hand grip strength is 3+ compared bilaterally. All other extremities with full ROM and no signs of edema.  Data Reviewed: Basic Metabolic Panel:  Recent Labs Lab 03/20/13 1645 03/20/13 1913 03/21/13 0450  NA 130*  --  134*  K 5.2* 4.1 3.7  CL 93*  --  98  CO2 23  --  27  GLUCOSE 319*  --  218*  BUN 12  --  8  CREATININE 0.68  --  0.67  CALCIUM 9.4  --  8.3*   Liver Function Tests:  Recent Labs Lab 03/20/13 1645  AST 23  ALT 17  ALKPHOS 91  BILITOT 0.4  PROT 7.6  ALBUMIN 3.3*   CBC:  Recent Labs Lab 03/20/13 1645 03/21/13 0450  WBC 11.8* 8.2  NEUTROABS 8.4*  --   HGB 12.7* 10.8*  HCT 36.3* 31.2*  MCV 80.7 80.4  PLT 143* 153   CBG:  Recent Labs Lab 03/20/13 1958 03/20/13 2207 03/21/13 0129 03/21/13 0444 03/21/13 0729  GLUCAP 249* 229* 118* 231* 180*    Recent Results (from the  past 240 hour(s))  SURGICAL PCR SCREEN     Status: None   Collection Time    03/20/13 10:40 PM      Result Value Range Status   MRSA, PCR NEGATIVE  NEGATIVE Final   Staphylococcus aureus NEGATIVE  NEGATIVE Final   Comment:            The Xpert SA Assay (FDA     approved for NASAL specimens     in patients over 54 years of age),     is one component of     a comprehensive surveillance     program.  Test performance has     been validated by [employer] for patients greater     than or equal to 54 year old.     It is not intended     to diagnose infection nor to     guide or monitor treatment.  GRAM STAIN     Status: None   Collection Time     03/21/13  5:00 AM      Result Value Range Status   Specimen Description ABSCESS RIGHT ARM   Final   Special Requests PATIENT ON FOLLOWING CLINDAMYCIN   Final   Gram Stain     Final   Value: FEW WBC PRESENT, PREDOMINANTLY MONONUCLEAR     MODERATE GRAM POSITIVE COCCI IN DIPLOS     ABUNDANT GRAM NEGATIVE RODS     CALLED TO,READ BACK BY AND VERIFIED WITH  A MOHAMMI(RN) @0550  [place] 03/21/13   Report Status 03/21/2013 FINAL   Final     Studies: No results found.  Scheduled Meds: . insulin aspart  0-9 Units Subcutaneous Q4H  . multivitamin with minerals  1 tablet Oral Daily  . ondansetron (ZOFRAN) IV  4 mg Intravenous Once  . piperacillin-tazobactam (ZOSYN)  IV  3.375 g Intravenous Q8H  . sodium chloride  3 mL Intravenous Q12H  . vancomycin  1,250 mg Intravenous Q12H   Continuous Infusions: . sodium chloride    . sodium chloride 125 mL/hr at 03/20/13 2217    Principal Problem:   Cellulitis and abscess of upper arm and forearm Active Problems:   DM2 (diabetes mellitus, type 2)      Stevphen Meuse, Physician Assistant Student Algis Downs, PA-C [phone]  [facility] 03/21/2013, 9:49 AM  LOS: 1 day

## 2013-03-21 NOTE — Anesthesia Preprocedure Evaluation (Addendum)
Anesthesia Evaluation  Patient identified by MRN, date of birth, ID band Patient awake    Reviewed: Allergy & Precautions, H&P , NPO status , Patient's Chart, lab work & pertinent test results  History of Anesthesia Complications Negative for: history of anesthetic complications  Airway Mallampati: II TM Distance: >3 FB Neck ROM: Full    Dental  (+) Teeth Intact, Chipped and Poor Dentition   Pulmonary Current Smoker,          Cardiovascular hypertension,     Neuro/Psych negative neurological ROS     GI/Hepatic negative GI ROS, (+)     substance abuse  alcohol use, cocaine use and IV drug use,   Endo/Other  diabetes, Type 2, Oral Hypoglycemic Agents  Renal/GU negative Renal ROS     Musculoskeletal  (+) Arthritis -, Osteoarthritis,    Abdominal   Peds  Hematology negative hematology ROS (+)   Anesthesia Other Findings   Reproductive/Obstetrics                          Anesthesia Physical Anesthesia Plan  ASA: III and emergent  Anesthesia Plan: General   Post-op Pain Management:    Induction: Intravenous  Airway Management Planned: LMA  Additional Equipment:   Intra-op Plan:   Post-operative Plan: Extubation in OR  Informed Consent: I have reviewed the patients History and Physical, chart, labs and discussed the procedure including the risks, benefits and alternatives for the proposed anesthesia with the patient or authorized representative who has indicated his/her understanding and acceptance.   Dental advisory given  Plan Discussed with: CRNA, Anesthesiologist and Surgeon  Anesthesia Plan Comments:        Anesthesia Quick Evaluation

## 2013-03-21 NOTE — Discharge Summary (Signed)
Physician Discharge Summary  Raymond Winters BJY:[id number] DOB: 02/25/1961 DOA: 03/20/2013  PCP: Gwendolyn Fill, MD  Admit date: 03/20/2013 Discharge date: 03/21/2013  Time spent: 50 minutes  Recommendations for Outpatient Follow-up:  1.  Wound Care starting at home on 10/1 for daily dressing changes. 2.  Follow final wound cultures and adjust antibiotics accordingly if needed. 3.  Diabetic management. 4.  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 This has been present for the past 1 week and progressively worsening, no drainage. 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 Hand surgery has been consulted and plans to take the patient to the OR tonight within the next hour or so. No systemic symptoms although he was initially tachycardic on presentation to ED and does have a mildly elevated WBC at 11.2k.   Hospital Course:   Cellulitis/Abscess of Right forearm w/ I&D:  -Likely from IV drug use.  -Ortho I&D at 1am on 9/30 by Dr. Mina Marble -Wound packed and in compression wrap  -Vanc + Clinda started empirically in ED  -D/c Clinda and started Zosyn due to culture results of Gram + and Gram - growth.  -ID recommends bactrim PO at time of discharge. Will Rx 10 days BID. -He will follow up with both Hand Surgery and ID after discharge.  DM  - Not well controlled at the time of hospitalization - Hgb A1c pending  - Continue Metformin and Glipizide at home.  IV Drug Use:  -Pt. Counseled on risks involved with IVD use  -Pt.  Reported interest in beginning inpatient rehab through [facility] program  -Social Work consulted for education and assistance with rehab programs  -Clonidine offered PRN inpatient for withdraw symptoms.   Consultants:  Hand Surgery-- Dr. Mina Marble  ID-- Dr. Ninetta Lights  Procedures:  I & D of right forearm abscess  Antibiotics:  Vancomycin 9/29 >> 9/30 Clindamycin 9/29 >> 9/30; d/c and changed to Zosyn due to culture results  Zosyn 9/30   Discharge Exam: Filed Vitals:   03/21/13 0545  BP: 112/76  Pulse: 89  Temp: 98.9 F (37.2 C)  Resp: 17   General: Well appearing,  AAO x 3  Cardiovascular: RRR, no m/g/r, minor right hand edema likely due to compression wrap; cap refill is 1+; distal pulses intact  Respiratory: CTAB, no wheezes, rales, rhonchi  Abdomen: Soft, non tender, no palpable masses, no distention or organomegaly  Musculoskeletal: Right arm in compression wrap with gauze dressing. Limited elbow ROM due to wrap. Right hand grip strength is 3+ compared bilaterally. All other extremities with full ROM and no signs of edema    Discharge Instructions      Discharge Orders   Future Orders Complete By Expires   Diet Carb Modified  As directed    Increase activity slowly  As directed    PT whirlpool therapy  As directed 03/21/2014   Questions:     Where should this test be performed?:  Medication List         glipiZIDE 5 MG tablet  Commonly known as:  GLUCOTROL  Take 5 mg by mouth 2 (two) times daily before a meal.     metFORMIN 500 MG tablet  Commonly known as:  GLUCOPHAGE  Take 500 mg by mouth 2 (two) times daily with a meal.     multivitamin with minerals Tabs tablet  Take 1 tablet by mouth daily.     oxyCODONE-acetaminophen 10-325 MG per tablet  Commonly known as:  PERCOCET  Take 1 tablet by mouth every 4 (four) hours as needed for pain.     sulfamethoxazole-trimethoprim 800-160 MG per tablet  Commonly known as:  BACTRIM DS  Take 1  tablet by mouth 2 (two) times daily.       No Known Allergies Follow-up Information   Follow up with Marlowe Shores, MD On 03/23/2013.   Specialty:  Orthopedic Surgery   Contact information:   [address] [place] [address] [phone]       Follow up with [facility]              On 04/04/2013. (9 am wound clinic apt, if they have cancellations they will call patient)    Contact information:   [address] [place] [address] [phone]      Follow up with Johny Sax, MD. Schedule an appointment as soon as possible for a visit in 1 week.   Specialty:  Infectious Diseases   Contact information:   [address]  [address] [place] [address] [phone]       Follow up with Gwendolyn Fill, MD. Schedule an appointment as soon as possible for a visit in 2 weeks.   Specialty:  Family Medicine   Contact information:   [address] [place] [address] [phone]        The results of significant diagnostics from this hospitalization (including imaging, microbiology, ancillary and laboratory) are listed below for reference.    Significant Diagnostic Studies: No results found.  Microbiology: Recent Results (from the past 240 hour(s))  URINE CULTURE     Status: None   Collection Time    03/20/13  3:34 PM      Result Value Range Status   Specimen Description URINE, RANDOM   Final   Special Requests Normal   Final   Culture  Setup Time     Final   Value: 03/20/2013 16:12     Performed at [facility] Count     Final   Value: NO GROWTH     Performed at [facility]   Culture     Final   Value: NO GROWTH     Performed at [facility]   Report Status 03/21/2013 FINAL   Final  SURGICAL PCR SCREEN     Status: None   Collection Time    03/20/13 10:40 PM      Result Value Range Status   MRSA, PCR NEGATIVE  NEGATIVE Final    Staphylococcus aureus NEGATIVE  NEGATIVE Final   Comment:            The Xpert SA Assay (FDA     approved for NASAL specimens     in patients over 53 years of age),     is one component of     a comprehensive surveillance  program.  Test performance has     been validated by [facility] for patients greater     than or equal to 17 year old.     It is not intended     to diagnose infection nor to     guide or monitor treatment.  GRAM STAIN     Status: None   Collection Time    03/21/13  5:00 AM      Result Value Range Status   Specimen Description ABSCESS RIGHT ARM   Final   Special Requests PATIENT ON FOLLOWING CLINDAMYCIN   Final   Gram Stain     Final   Value: FEW WBC PRESENT, PREDOMINANTLY MONONUCLEAR     MODERATE GRAM POSITIVE COCCI IN DIPLOS     ABUNDANT GRAM NEGATIVE RODS     CALLED TO,READ BACK BY AND VERIFIED WITH  A MOHAMMI(RN) @0550  [place] 03/21/13   Report Status 03/21/2013 FINAL   Final  CULTURE, ROUTINE-ABSCESS     Status: None   Collection Time    03/21/13  5:00 AM      Result Value Range Status   Specimen Description ABSCESS RIGHT ARM   Final   Special Requests PATIENT ON FOLLOWING CLINDAMYCIN   Final   Gram Stain     Final   Value: FEW WBC PRESENT, PREDOMINANTLY MONONUCLEAR     MODERATE GRAM POSITIVE COCCI IN PAIRS     ABUNDANT GRAM NEGATIVE RODS     Gram Stain Report Called to,Read Back By and Verified With: Gram Stain Report Called to,Read Back By and Verified With: A MOHAMMI RN @0550  TC[place]AND 03/21/13 Performed at [facility]     Performed at [facility]   Culture PENDING   Incomplete   Report Status PENDING   Incomplete     Labs: Basic Metabolic Panel:  Recent Labs Lab 03/20/13 1645 03/20/13 1913 03/21/13 0450  NA 130*  --  134*  K 5.2* 4.1 3.7  CL 93*  --  98  CO2 23  --  27  GLUCOSE 319*  --  218*  BUN 12  --  8  CREATININE 0.68  --  0.67  CALCIUM 9.4  --  8.3*   Liver Function Tests:  Recent Labs Lab  03/20/13 1645  AST 23  ALT 17  ALKPHOS 91  BILITOT 0.4  PROT 7.6  ALBUMIN 3.3*   CBC:  Recent Labs Lab 03/20/13 1645 03/21/13 0450  WBC 11.8* 8.2  NEUTROABS 8.4*  --   HGB 12.7* 10.8*  HCT 36.3* 31.2*  MCV 80.7 80.4  PLT 143* 153   CBG:  Recent Labs Lab 03/20/13 2207 03/21/13 0129 03/21/13 0444 03/21/13 0729 03/21/13 1203  GLUCAP 229* 118* 231* 180* 184*       SignedConley Canal [phone] [facility] 03/21/2013, 1:53 PM

## 2013-03-21 NOTE — Consult Note (Addendum)
INFECTIOUS DISEASE CONSULT NOTE  Date of Admission:  03/20/2013  Date of Consult:  03/21/2013  Reason for Consult: R arm abscess Referring Physician: Thedore Mins  Impression/Recommendation R arm abscess IVDA DM2  Would Consider change to bactrim Check HIV and Hep C.  STD screening Will see him back in ID clinic in 1-2 weeks.  Diabetic control, teaching, A1C, ACE-I may be useful as well.   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 Will also cover some GNR. He needs std/HIV/Hep testing.  F/u Cx's He would benefit from PCP.   Thank you so much for this interesting consult,   Johny Sax (pager) [phone] [email or web address]  Raymond Winters is an 52 y.o. male.  HPI: 52 yo M with hx of DM, heroin use and 1 week of R arm swelling. He came to ED on 9-29 and was found to have an abscess. He was taken to OR early on 9-30 and was debrided. Gram stain GNR and GPC (diplos). He was initially started on clinda then changed to vanco/zosyn.  At home he denies proximal erythema or swelling. He had occasional chills, no fever.    Past Medical History  Diagnosis Date  . Heroin abuse   . Diabetes mellitus without complication     Past Surgical History  Procedure Laterality Date  . I&d extremity Right 03/20/2013    Procedure: IRRIGATION AND DEBRIDEMENT EXTREMITY;  Surgeon: Marlowe Shores, MD;  Location: [facility] OR;  Service: Orthopedics;  Laterality: Right;     No Known Allergies  Medications:  Scheduled: . cloNIDine  0.1 mg Oral QID   Followed by  . [START ON 03/23/2013] cloNIDine  0.1 mg Oral BID   Followed by  . [START ON 03/26/2013] cloNIDine  0.1 mg Oral QAC breakfast  . insulin aspart  0-9 Units Subcutaneous Q4H  . multivitamin with minerals  1 tablet Oral Daily  . ondansetron (ZOFRAN) IV  4 mg Intravenous Once  . piperacillin-tazobactam (ZOSYN)  IV  3.375 g Intravenous Q8H  . sodium chloride  3 mL Intravenous Q12H  . vancomycin   1,250 mg Intravenous Q12H    Total days of antibiotics 1          Social History:  reports that he has been smoking.  He does not have any smokeless tobacco history on file. He reports that  drinks alcohol. He reports that he uses illicit drugs (IV).  History reviewed. No pertinent family history.  General ROS: no change in vision, no sob, no cough, no cp, no change in urination, no change in BM, see HPI.   Blood pressure 112/76, pulse 89, temperature 98.9 F (37.2 C), temperature source Oral, resp. rate 17, height 5' 8.4" (1.737 m), weight 67.268 kg (148 lb 4.8 oz), SpO2 97.00%. General appearance: alert, cooperative and no distress Eyes: negative findings: pupils equal, round, reactive to light and accomodation and conjunctiva injected Throat: normal findings: oropharynx pink & moist without lesions or evidence of thrush and abnormal findings: poor dentition Neck: no adenopathy and supple, symmetrical, trachea midline Lungs: clear to auscultation bilaterally Heart: regular rate and rhythm Abdomen: normal findings: bowel sounds normal and soft, non-tender Extremities: edema none and RUE is dressed, still tender. areas of thombosed veions on L UE.    Results for orders placed during the hospital encounter of 03/20/13 (from the past 48 hour(s))  URINALYSIS, ROUTINE W REFLEX MICROSCOPIC     Status: Abnormal   Collection Time  03/20/13  3:34 PM      Result Value Range   Color, Urine YELLOW  YELLOW   APPearance CLEAR  CLEAR   Specific Gravity, Urine 1.025  1.005 - 1.030   pH 5.0  5.0 - 8.0   Glucose, UA >1000 (*) NEGATIVE mg/dL   Hgb urine dipstick NEGATIVE  NEGATIVE   Bilirubin Urine NEGATIVE  NEGATIVE   Ketones, ur NEGATIVE  NEGATIVE mg/dL   Protein, ur NEGATIVE  NEGATIVE mg/dL   Urobilinogen, UA 1.0  0.0 - 1.0 mg/dL   Nitrite NEGATIVE  NEGATIVE   Leukocytes, UA NEGATIVE  NEGATIVE  URINE MICROSCOPIC-ADD ON     Status: Abnormal   Collection Time    03/20/13  3:34 PM       Result Value Range   Squamous Epithelial / LPF FEW (*) RARE   WBC, UA 0-2  <3 WBC/hpf  GLUCOSE, CAPILLARY     Status: Abnormal   Collection Time    03/20/13  4:29 PM      Result Value Range   Glucose-Capillary 308 (*) 70 - 99 mg/dL  CBC WITH DIFFERENTIAL     Status: Abnormal   Collection Time    03/20/13  4:45 PM      Result Value Range   WBC 11.8 (*) 4.0 - 10.5 K/uL   RBC 4.50  4.22 - 5.81 MIL/uL   Hemoglobin 12.7 (*) 13.0 - 17.0 g/dL   HCT 16.1 (*) 09.6 - 04.5 %   MCV 80.7  78.0 - 100.0 fL   MCH 28.2  26.0 - 34.0 pg   MCHC 35.0  30.0 - 36.0 g/dL   RDW 40.9  81.1 - 91.4 %   Platelets 143 (*) 150 - 400 K/uL   Neutrophils Relative % 71  43 - 77 %   Neutro Abs 8.4 (*) 1.7 - 7.7 K/uL   Lymphocytes Relative 14  12 - 46 %   Lymphs Abs 1.7  0.7 - 4.0 K/uL   Monocytes Relative 14 (*) 3 - 12 %   Monocytes Absolute 1.6 (*) 0.1 - 1.0 K/uL   Eosinophils Relative 1  0 - 5 %   Eosinophils Absolute 0.1  0.0 - 0.7 K/uL   Basophils Relative 0  0 - 1 %   Basophils Absolute 0.0  0.0 - 0.1 K/uL  COMPREHENSIVE METABOLIC PANEL     Status: Abnormal   Collection Time    03/20/13  4:45 PM      Result Value Range   Sodium 130 (*) 135 - 145 mEq/L   Potassium 5.2 (*) 3.5 - 5.1 mEq/L   Chloride 93 (*) 96 - 112 mEq/L   CO2 23  19 - 32 mEq/L   Glucose, Bld 319 (*) 70 - 99 mg/dL   BUN 12  6 - 23 mg/dL   Creatinine, Ser 7.82  0.50 - 1.35 mg/dL   Calcium 9.4  8.4 - 95.6 mg/dL   Total Protein 7.6  6.0 - 8.3 g/dL   Albumin 3.3 (*) 3.5 - 5.2 g/dL   AST 23  0 - 37 U/L   ALT 17  0 - 53 U/L   Alkaline Phosphatase 91  39 - 117 U/L   Total Bilirubin 0.4  0.3 - 1.2 mg/dL   GFR calc non Af Amer >90  >90 mL/min   GFR calc Af Amer >90  >90 mL/min   Comment: (NOTE)     The eGFR has been calculated using the CKD EPI equation.  This calculation has not been validated in all clinical situations.     eGFR's persistently <90 mL/min signify possible Chronic Kidney     Disease.  CG4 I-STAT (LACTIC ACID)      Status: Abnormal   Collection Time    03/20/13  4:56 PM      Result Value Range   Lactic Acid, Venous 2.97 (*) 0.5 - 2.2 mmol/L  POTASSIUM     Status: None   Collection Time    03/20/13  7:13 PM      Result Value Range   Potassium 4.1  3.5 - 5.1 mEq/L   Comment: DELTA CHECK NOTED  GLUCOSE, CAPILLARY     Status: Abnormal   Collection Time    03/20/13  7:58 PM      Result Value Range   Glucose-Capillary 249 (*) 70 - 99 mg/dL   Comment 1 Documented in Chart     Comment 2 Notify RN    GLUCOSE, CAPILLARY     Status: Abnormal   Collection Time    03/20/13 10:07 PM      Result Value Range   Glucose-Capillary 229 (*) 70 - 99 mg/dL  SURGICAL PCR SCREEN     Status: None   Collection Time    03/20/13 10:40 PM      Result Value Range   MRSA, PCR NEGATIVE  NEGATIVE   Staphylococcus aureus NEGATIVE  NEGATIVE   Comment:            The Xpert SA Assay (FDA     approved for NASAL specimens     in patients over 46 years of age),     is one component of     a comprehensive surveillance     program.  Test performance has     been validated by [employer] for patients greater     than or equal to 44 year old.     It is not intended     to diagnose infection nor to     guide or monitor treatment.  GLUCOSE, CAPILLARY     Status: Abnormal   Collection Time    03/21/13  1:29 AM      Result Value Range   Glucose-Capillary 118 (*) 70 - 99 mg/dL   Comment 1 Notify RN    GLUCOSE, CAPILLARY     Status: Abnormal   Collection Time    03/21/13  4:44 AM      Result Value Range   Glucose-Capillary 231 (*) 70 - 99 mg/dL   Comment 1 Notify RN    CBC     Status: Abnormal   Collection Time    03/21/13  4:50 AM      Result Value Range   WBC 8.2  4.0 - 10.5 K/uL   RBC 3.88 (*) 4.22 - 5.81 MIL/uL   Hemoglobin 10.8 (*) 13.0 - 17.0 g/dL   HCT 16.1 (*) 09.6 - 04.5 %   MCV 80.4  78.0 - 100.0 fL   MCH 27.8  26.0 - 34.0 pg   MCHC 34.6  30.0 - 36.0 g/dL   RDW 40.9  81.1 - 91.4 %   Platelets 153   150 - 400 K/uL  BASIC METABOLIC PANEL     Status: Abnormal   Collection Time    03/21/13  4:50 AM      Result Value Range   Sodium 134 (*) 135 - 145 mEq/L   Potassium 3.7  3.5 -  5.1 mEq/L   Chloride 98  96 - 112 mEq/L   CO2 27  19 - 32 mEq/L   Glucose, Bld 218 (*) 70 - 99 mg/dL   BUN 8  6 - 23 mg/dL   Creatinine, Ser 1.61  0.50 - 1.35 mg/dL   Calcium 8.3 (*) 8.4 - 10.5 mg/dL   GFR calc non Af Amer >90  >90 mL/min   GFR calc Af Amer >90  >90 mL/min   Comment: (NOTE)     The eGFR has been calculated using the CKD EPI equation.     This calculation has not been validated in all clinical situations.     eGFR's persistently <90 mL/min signify possible Chronic Kidney     Disease.  GRAM STAIN     Status: None   Collection Time    03/21/13  5:00 AM      Result Value Range   Specimen Description ABSCESS RIGHT ARM     Special Requests PATIENT ON FOLLOWING CLINDAMYCIN     Gram Stain       Value: FEW WBC PRESENT, PREDOMINANTLY MONONUCLEAR     MODERATE GRAM POSITIVE COCCI IN DIPLOS     ABUNDANT GRAM NEGATIVE RODS     CALLED TO,READ BACK BY AND VERIFIED WITH  A MOHAMMI(RN) @0550  [place] 03/21/13   Report Status 03/21/2013 FINAL    CULTURE, ROUTINE-ABSCESS     Status: None   Collection Time    03/21/13  5:00 AM      Result Value Range   Specimen Description ABSCESS RIGHT ARM     Special Requests PATIENT ON FOLLOWING CLINDAMYCIN     Gram Stain       Value: FEW WBC PRESENT, PREDOMINANTLY MONONUCLEAR     MODERATE GRAM POSITIVE COCCI IN PAIRS     ABUNDANT GRAM NEGATIVE RODS     Gram Stain Report Called to,Read Back By and Verified With: Gram Stain Report Called to,Read Back By and Verified With: A MOHAMMI RN @0550  [place] 03/21/13 Performed at [facility]     Performed at [facility]   Culture PENDING     Report Status PENDING    GLUCOSE, CAPILLARY     Status: Abnormal   Collection Time    03/21/13  7:29 AM      Result Value Range   Glucose-Capillary 180 (*) 70 -  99 mg/dL   Comment 1 Documented in Chart     Comment 2 Notify RN        Component Value Date/Time   SDES ABSCESS RIGHT ARM 03/21/2013 0500   SDES ABSCESS RIGHT ARM 03/21/2013 0500   SPECREQUEST PATIENT ON FOLLOWING CLINDAMYCIN 03/21/2013 0500   SPECREQUEST PATIENT ON FOLLOWING CLINDAMYCIN 03/21/2013 0500   CULT PENDING 03/21/2013 0500   REPTSTATUS 03/21/2013 FINAL 03/21/2013 0500   REPTSTATUS PENDING 03/21/2013 0500   No results found. Recent Results (from the past 240 hour(s))  SURGICAL PCR SCREEN     Status: None   Collection Time    03/20/13 10:40 PM      Result Value Range Status   MRSA, PCR NEGATIVE  NEGATIVE Final   Staphylococcus aureus NEGATIVE  NEGATIVE Final   Comment:            The Xpert SA Assay (FDA     approved for NASAL specimens     in patients over 43 years of age),     is one component of     a comprehensive surveillance  program.  Test performance has     been validated by [facility] for patients greater     than or equal to 64 year old.     It is not intended     to diagnose infection nor to     guide or monitor treatment.  GRAM STAIN     Status: None   Collection Time    03/21/13  5:00 AM      Result Value Range Status   Specimen Description ABSCESS RIGHT ARM   Final   Special Requests PATIENT ON FOLLOWING CLINDAMYCIN   Final   Gram Stain     Final   Value: FEW WBC PRESENT, PREDOMINANTLY MONONUCLEAR     MODERATE GRAM POSITIVE COCCI IN DIPLOS     ABUNDANT GRAM NEGATIVE RODS     CALLED TO,READ BACK BY AND VERIFIED WITH  A MOHAMMI(RN) @0550  [place] 03/21/13   Report Status 03/21/2013 FINAL   Final  CULTURE, ROUTINE-ABSCESS     Status: None   Collection Time    03/21/13  5:00 AM      Result Value Range Status   Specimen Description ABSCESS RIGHT ARM   Final   Special Requests PATIENT ON FOLLOWING CLINDAMYCIN   Final   Gram Stain     Final   Value: FEW WBC PRESENT, PREDOMINANTLY MONONUCLEAR     MODERATE GRAM POSITIVE COCCI IN PAIRS      ABUNDANT GRAM NEGATIVE RODS     Gram Stain Report Called to,Read Back By and Verified With: Gram Stain Report Called to,Read Back By and Verified With: A MOHAMMI RN @0550  [place] 03/21/13 Performed at [facility]     Performed at [facility]   Culture PENDING   Incomplete   Report Status PENDING   Incomplete      03/21/2013, 12:17 PM     LOS: 1 day

## 2013-03-21 NOTE — Progress Notes (Signed)
  I have directly reviewed the clinical findings, lab, imaging studies and management of this patient in detail. I have interviewed and examined the patient and agree with the documentation,  as recorded by the Physician extender.  Leroy Sea M.D on 03/21/2013 at 11:02 AM  [facility] Office  [phone]

## 2013-03-22 LAB — GC/CHLAMYDIA PROBE AMP: CT Probe RNA: NEGATIVE

## 2013-03-22 LAB — HEPATITIS PANEL, ACUTE
Hep A IgM: NEGATIVE
Hep B C IgM: NEGATIVE

## 2013-03-22 NOTE — ED Provider Notes (Signed)
Medical screening examination/treatment/procedure(s) were conducted as a shared visit with non-physician practitioner(s) and myself.  I personally evaluated the patient during the encounter  I interviewed and examined the patient. Lungs are CTAB. Cardiac exam wnl. Abdomen soft. Abscess to right arm. Hand consulted. Will admit to hospitalist.    Junius Argyle, MD 03/22/13 2156

## 2013-03-23 LAB — CULTURE, ROUTINE-ABSCESS

## 2013-03-26 LAB — ANAEROBIC CULTURE

## 2013-04-04 ENCOUNTER — Encounter (HOSPITAL_BASED_OUTPATIENT_CLINIC_OR_DEPARTMENT_OTHER): Payer: Non-veteran care | Attending: General Surgery

## 2013-04-04 ENCOUNTER — Encounter: Payer: Self-pay | Admitting: Infectious Diseases

## 2013-04-04 ENCOUNTER — Ambulatory Visit (INDEPENDENT_AMBULATORY_CARE_PROVIDER_SITE_OTHER): Payer: Non-veteran care | Admitting: Infectious Diseases

## 2013-04-04 VITALS — BP 100/68 | HR 87 | Temp 97.6°F | Ht 67.0 in | Wt 127.0 lb

## 2013-04-04 DIAGNOSIS — L98499 Non-pressure chronic ulcer of skin of other sites with unspecified severity: Secondary | ICD-10-CM | POA: Insufficient documentation

## 2013-04-04 DIAGNOSIS — E1169 Type 2 diabetes mellitus with other specified complication: Secondary | ICD-10-CM | POA: Insufficient documentation

## 2013-04-04 DIAGNOSIS — IMO0002 Reserved for concepts with insufficient information to code with codable children: Secondary | ICD-10-CM | POA: Insufficient documentation

## 2013-04-04 DIAGNOSIS — F1110 Opioid abuse, uncomplicated: Secondary | ICD-10-CM

## 2013-04-04 DIAGNOSIS — F1120 Opioid dependence, uncomplicated: Secondary | ICD-10-CM | POA: Insufficient documentation

## 2013-04-04 DIAGNOSIS — E119 Type 2 diabetes mellitus without complications: Secondary | ICD-10-CM

## 2013-04-04 NOTE — Assessment & Plan Note (Signed)
He is doing well, has completed anbx. His wound appears to be healing well. Will hold on further anbx. He will f/u at wound care center. rtc prn.

## 2013-04-04 NOTE — Assessment & Plan Note (Addendum)
Has f/u at [place].

## 2013-04-04 NOTE — Assessment & Plan Note (Signed)
He is meeting with Max for out patient options.

## 2013-04-04 NOTE — Progress Notes (Signed)
  Subjective:    Patient ID: Raymond Winters, male    DOB: 1961-06-06, 52 y.o.   MRN: [id number]  HPI HPI: 52 yo M with hx of DM2, heroin use and 1 week of R arm swelling. He came to ED on 9-29 and was found to have an abscess. He underwent I & D on 9-30. He was treated with IV anbx in hospital, then changed to bactrim at d/c. His Cx grew K oxytoca (R- Amp).  Was seen at wound care center this AM and was told that it looks good. Has finished bactrim.  Has PCP at [facility] for his DM. Has had ophtho this year.   Review of Systems  Constitutional: Negative for fever and chills.  Respiratory: Negative for cough and shortness of breath.   Cardiovascular: Negative for chest pain.  Gastrointestinal: Negative for diarrhea and constipation.  Genitourinary: Negative for dysuria.       Objective:   Physical Exam  Constitutional: He appears well-developed and well-nourished.  HENT:  Mouth/Throat: No oropharyngeal exudate.  Eyes: EOM are normal. Pupils are equal, round, and reactive to light.  Neck: Neck supple.  Cardiovascular: Normal rate, regular rhythm and normal heart sounds.   Pulmonary/Chest: Effort normal and breath sounds normal.  Abdominal: Soft. Bowel sounds are normal. He exhibits no distension. There is no tenderness.  Musculoskeletal:       Arms: Lymphadenopathy:    He has no cervical adenopathy.          Assessment & Plan:

## 2013-04-05 NOTE — H&P (Signed)
Raymond Winters, [place] NO.:  [id number]  MEDICAL RECORD NO.:  [id number]  LOCATION:  FOOT                         FACILITY:  [facility]  PHYSICIAN:  Joanne Gavel, M.D.        DATE OF BIRTH:  01/06/1961  DATE OF ADMISSION:  04/04/2013 DATE OF DISCHARGE:                             HISTORY & PHYSICAL   CHIEF COMPLAINT:  Wound, right arm.  HISTORY OF PRESENT ILLNESS:  This is a 52 year old male diabetic with a long history of intravenous drug use, developed a sore in his right arm 3 weeks ago.  Two weeks ago, he appeared in the emergency room where he saw a sizable abscess was drained.  He is left with a large wound.  PAST MEDICAL HISTORY:  Significant for diabetes type 2 and heroin addiction.  CIGARETTES:  He smokes more than a pack a day.  ALCOHOL:  Approximately 2 beers per day.  MEDICATIONS:  Glipizide, metformin, multivitamins, oxycodone, and Bactrim.  ALLERGIES:  None.  SURGICAL HISTORY:  Essentially negative.  REVIEW OF SYSTEMS:  As above.  PHYSICAL EXAMINATION:  VITAL SIGNS:  Temperature 98.3, pulse 92, respirations 18, blood pressure 111/77. GENERAL APPEARANCE:  Well developed, slender in no distress. CHEST:  Clear. HEART:  Regular rhythm. EXTREMITIES:  Examination of the arm reveals a 7.0 x 3.2 x 2.2 skin defect on the right forearm.  Some of the edges are a little bit necrotic and the base has some adherent slough.  IMPRESSION:  Status post drainage of abscess, right arm in a diabetic patient.  PLAN:  I have debrided some of the necrotic tissue and subcutaneous tissue.  A tissue specimen was sent for culture.  We will start with Santyl and Hydrogel.  We will see him in 7 days.     Joanne Gavel, M.D.     RA/MEDQ  D:  04/04/2013  T:  04/05/2013  Job:  [id number]

## 2013-04-25 ENCOUNTER — Encounter (HOSPITAL_BASED_OUTPATIENT_CLINIC_OR_DEPARTMENT_OTHER): Payer: Non-veteran care | Attending: General Surgery

## 2013-04-25 DIAGNOSIS — IMO0002 Reserved for concepts with insufficient information to code with codable children: Secondary | ICD-10-CM | POA: Insufficient documentation

## 2013-04-25 DIAGNOSIS — Y838 Other surgical procedures as the cause of abnormal reaction of the patient, or of later complication, without mention of misadventure at the time of the procedure: Secondary | ICD-10-CM | POA: Insufficient documentation

## 2013-04-25 DIAGNOSIS — T8140XA Infection following a procedure, unspecified, initial encounter: Secondary | ICD-10-CM | POA: Insufficient documentation

## 2013-05-23 ENCOUNTER — Telehealth: Payer: Self-pay | Admitting: *Deleted

## 2013-12-04 ENCOUNTER — Encounter (HOSPITAL_COMMUNITY): Payer: Self-pay | Admitting: Emergency Medicine

## 2013-12-04 ENCOUNTER — Inpatient Hospital Stay (HOSPITAL_COMMUNITY)
Admission: EM | Admit: 2013-12-04 | Discharge: 2013-12-06 | DRG: 896 | Disposition: A | Discharge status: Home / Self Care | Payer: Non-veteran care | Attending: Internal Medicine | Admitting: Internal Medicine

## 2013-12-04 DIAGNOSIS — R112 Nausea with vomiting, unspecified: Secondary | ICD-10-CM

## 2013-12-04 DIAGNOSIS — F1193 Opioid use, unspecified with withdrawal: Secondary | ICD-10-CM

## 2013-12-04 DIAGNOSIS — F10239 Alcohol dependence with withdrawal, unspecified: Secondary | ICD-10-CM | POA: Diagnosis present

## 2013-12-04 DIAGNOSIS — F1123 Opioid dependence with withdrawal: Secondary | ICD-10-CM | POA: Diagnosis present

## 2013-12-04 DIAGNOSIS — E118 Type 2 diabetes mellitus with unspecified complications: Secondary | ICD-10-CM

## 2013-12-04 DIAGNOSIS — R739 Hyperglycemia, unspecified: Secondary | ICD-10-CM

## 2013-12-04 DIAGNOSIS — Z794 Long term (current) use of insulin: Secondary | ICD-10-CM

## 2013-12-04 DIAGNOSIS — E1165 Type 2 diabetes mellitus with hyperglycemia: Secondary | ICD-10-CM | POA: Diagnosis present

## 2013-12-04 DIAGNOSIS — F1110 Opioid abuse, uncomplicated: Secondary | ICD-10-CM | POA: Diagnosis present

## 2013-12-04 DIAGNOSIS — F1020 Alcohol dependence, uncomplicated: Secondary | ICD-10-CM | POA: Diagnosis present

## 2013-12-04 DIAGNOSIS — E874 Mixed disorder of acid-base balance: Secondary | ICD-10-CM | POA: Diagnosis present

## 2013-12-04 DIAGNOSIS — E876 Hypokalemia: Secondary | ICD-10-CM | POA: Diagnosis not present

## 2013-12-04 DIAGNOSIS — IMO0002 Reserved for concepts with insufficient information to code with codable children: Secondary | ICD-10-CM | POA: Diagnosis present

## 2013-12-04 DIAGNOSIS — R197 Diarrhea, unspecified: Secondary | ICD-10-CM

## 2013-12-04 DIAGNOSIS — Z9119 Patient's noncompliance with other medical treatment and regimen: Secondary | ICD-10-CM

## 2013-12-04 DIAGNOSIS — F10939 Alcohol use, unspecified with withdrawal, unspecified: Secondary | ICD-10-CM | POA: Diagnosis present

## 2013-12-04 DIAGNOSIS — E872 Acidosis, unspecified: Secondary | ICD-10-CM | POA: Diagnosis present

## 2013-12-04 DIAGNOSIS — Z681 Body mass index (BMI) 19 or less, adult: Secondary | ICD-10-CM

## 2013-12-04 DIAGNOSIS — F1010 Alcohol abuse, uncomplicated: Secondary | ICD-10-CM

## 2013-12-04 DIAGNOSIS — Z91199 Patient's noncompliance with other medical treatment and regimen due to unspecified reason: Secondary | ICD-10-CM

## 2013-12-04 DIAGNOSIS — F1120 Opioid dependence, uncomplicated: Secondary | ICD-10-CM | POA: Diagnosis present

## 2013-12-04 DIAGNOSIS — E1129 Type 2 diabetes mellitus with other diabetic kidney complication: Secondary | ICD-10-CM | POA: Diagnosis present

## 2013-12-04 DIAGNOSIS — F17200 Nicotine dependence, unspecified, uncomplicated: Secondary | ICD-10-CM | POA: Diagnosis present

## 2013-12-04 DIAGNOSIS — E1110 Type 2 diabetes mellitus with ketoacidosis without coma: Secondary | ICD-10-CM

## 2013-12-04 DIAGNOSIS — F19939 Other psychoactive substance use, unspecified with withdrawal, unspecified: Principal | ICD-10-CM | POA: Diagnosis present

## 2013-12-04 DIAGNOSIS — E43 Unspecified severe protein-calorie malnutrition: Secondary | ICD-10-CM

## 2013-12-04 DIAGNOSIS — E873 Alkalosis: Secondary | ICD-10-CM

## 2013-12-04 DIAGNOSIS — E875 Hyperkalemia: Secondary | ICD-10-CM

## 2013-12-04 LAB — COMPREHENSIVE METABOLIC PANEL
ALT: 48 U/L (ref 0–53)
AST: 65 U/L — ABNORMAL HIGH (ref 0–37)
Albumin: 3.5 g/dL (ref 3.5–5.2)
Alkaline Phosphatase: 284 U/L — ABNORMAL HIGH (ref 39–117)
BUN: 16 mg/dL (ref 6–23)
CALCIUM: 10.8 mg/dL — AB (ref 8.4–10.5)
CO2: 20 meq/L (ref 19–32)
Chloride: 85 mEq/L — ABNORMAL LOW (ref 96–112)
Creatinine, Ser: 0.97 mg/dL (ref 0.50–1.35)
GLUCOSE: 607 mg/dL — AB (ref 70–99)
Potassium: 5.5 mEq/L — ABNORMAL HIGH (ref 3.7–5.3)
Sodium: 129 mEq/L — ABNORMAL LOW (ref 137–147)
Total Bilirubin: 1.4 mg/dL — ABNORMAL HIGH (ref 0.3–1.2)
Total Protein: 8.1 g/dL (ref 6.0–8.3)

## 2013-12-04 LAB — CBC WITH DIFFERENTIAL/PLATELET
Basophils Absolute: 0 10*3/uL (ref 0.0–0.1)
Basophils Relative: 0 % (ref 0–1)
EOS ABS: 0 10*3/uL (ref 0.0–0.7)
Eosinophils Relative: 0 % (ref 0–5)
HCT: 37.2 % — ABNORMAL LOW (ref 39.0–52.0)
HEMOGLOBIN: 13 g/dL (ref 13.0–17.0)
Lymphocytes Relative: 39 % (ref 12–46)
Lymphs Abs: 1.9 10*3/uL (ref 0.7–4.0)
MCH: 27 pg (ref 26.0–34.0)
MCHC: 34.9 g/dL (ref 30.0–36.0)
MCV: 77.2 fL — ABNORMAL LOW (ref 78.0–100.0)
MONOS PCT: 3 % (ref 3–12)
Monocytes Absolute: 0.2 10*3/uL (ref 0.1–1.0)
NEUTROS ABS: 2.7 10*3/uL (ref 1.7–7.7)
NEUTROS PCT: 57 % (ref 43–77)
PLATELETS: 80 10*3/uL — AB (ref 150–400)
RBC: 4.82 MIL/uL (ref 4.22–5.81)
RDW: 12.8 % (ref 11.5–15.5)
WBC: 4.8 10*3/uL (ref 4.0–10.5)

## 2013-12-04 LAB — ETHANOL: Alcohol, Ethyl (B): 119 mg/dL — ABNORMAL HIGH (ref 0–11)

## 2013-12-04 LAB — I-STAT ARTERIAL BLOOD GAS, ED
Acid-base deficit: 4 mmol/L — ABNORMAL HIGH (ref 0.0–2.0)
Bicarbonate: 18.8 mEq/L — ABNORMAL LOW (ref 20.0–24.0)
O2 Saturation: 98 %
PCO2 ART: 28.4 mmHg — AB (ref 35.0–45.0)
PH ART: 7.428 (ref 7.350–7.450)
PO2 ART: 99 mmHg (ref 80.0–100.0)
Patient temperature: 98.5
TCO2: 20 mmol/L (ref 0–100)

## 2013-12-04 LAB — CBG MONITORING, ED
GLUCOSE-CAPILLARY: 199 mg/dL — AB (ref 70–99)
Glucose-Capillary: 532 mg/dL — ABNORMAL HIGH (ref 70–99)

## 2013-12-04 MED ORDER — LORAZEPAM 2 MG/ML IJ SOLN
1.0000 mg | Freq: Once | INTRAMUSCULAR | Status: AC
  Administered 2013-12-04: 1 mg via INTRAVENOUS
  Filled 2013-12-04: qty 1

## 2013-12-04 MED ORDER — SODIUM BICARBONATE 8.4 % IV SOLN
Freq: Once | INTRAVENOUS | Status: DC
  Administered 2013-12-04: 23:00:00 via INTRAVENOUS
  Filled 2013-12-04: qty 100

## 2013-12-04 MED ORDER — SODIUM CHLORIDE 0.9 % IV SOLN
INTRAVENOUS | Status: DC
  Administered 2013-12-04: 4.7 [IU]/h via INTRAVENOUS
  Filled 2013-12-04 (×2): qty 1

## 2013-12-04 MED ORDER — INSULIN ASPART 100 UNIT/ML ~~LOC~~ SOLN
10.0000 [IU] | Freq: Once | SUBCUTANEOUS | Status: DC
  Filled 2013-12-04: qty 1

## 2013-12-04 MED ORDER — SODIUM CHLORIDE 0.9 % IV SOLN
1.0000 g | Freq: Once | INTRAVENOUS | Status: AC
Start: 2013-12-04 — End: 2013-12-04
  Administered 2013-12-04: 1 g via INTRAVENOUS
  Filled 2013-12-04: qty 10

## 2013-12-04 MED ORDER — DEXTROSE-NACL 5-0.45 % IV SOLN: INTRAVENOUS | Status: DC

## 2013-12-04 MED ORDER — INSULIN REGULAR HUMAN 100 UNIT/ML IJ SOLN: INTRAMUSCULAR | Status: DC

## 2013-12-04 MED ORDER — SODIUM CHLORIDE 0.9 % IV SOLN
INTRAVENOUS | Status: DC
Start: 2013-12-04 — End: 2013-12-05

## 2013-12-04 MED ORDER — ONDANSETRON HCL 4 MG/2ML IJ SOLN
4.0000 mg | Freq: Once | INTRAMUSCULAR | Status: AC
  Administered 2013-12-04: 4 mg via INTRAVENOUS
  Filled 2013-12-04: qty 2

## 2013-12-04 MED ORDER — THIAMINE HCL 100 MG/ML IJ SOLN
100.0000 mg | Freq: Every day | INTRAMUSCULAR | Status: DC
  Administered 2013-12-05 (×2): 100 mg via INTRAVENOUS
  Filled 2013-12-04 (×2): qty 1

## 2013-12-04 MED ORDER — DEXTROSE-NACL 5-0.45 % IV SOLN
INTRAVENOUS | Status: DC
  Administered 2013-12-05: 10 mL via INTRAVENOUS

## 2013-12-04 MED ORDER — INSULIN ASPART 100 UNIT/ML IV SOLN
10.0000 [IU] | Freq: Once | INTRAVENOUS | Status: AC
  Administered 2013-12-04: 10 [IU] via INTRAVENOUS

## 2013-12-04 MED ORDER — SODIUM CHLORIDE 0.9 % IV BOLUS (SEPSIS)
1000.0000 mL | Freq: Once | INTRAVENOUS | Status: AC
  Administered 2013-12-04: 1000 mL via INTRAVENOUS

## 2013-12-04 NOTE — ED Notes (Signed)
CRITICAL VALUE ALERT  Critical value received:  Glucose 600  Date of notification:  12/04/2013  Time of notification:  1913  Critical value read back:yes  Nurse who received alert:  MG Jenelle MagesHairston, RN  MD notified (1st page):  Dr. Effie ShyWentz  Time of first page:  1913  MD notified (2nd page):  Time of second page:  Responding MD:  Dr. Effie ShyWentz  Time MD responded:  [phone]

## 2013-12-04 NOTE — ED Notes (Signed)
T-waves no longer as peaked, no longer reading double.

## 2013-12-04 NOTE — ED Notes (Signed)
Pt very sleepy, has difficulty staying awake to carry on conversation. Admitting physicians to bedside.  Speaking to S/O.

## 2013-12-04 NOTE — H&P (Signed)
Date: 12/04/2013               Patient Name:  Raymond Winters MRN: [id number]  DOB: 04/27/1961 Age / Sex: 53 y.o., male   PCP: Gwendolyn Fill, MD         Medical Service: Internal Medicine Teaching Service         Attending Physician: Dr. Aletta Edouard, MD    First Contact: Dr. Harmon Dun, MD Pager: [phone]  Second Contact: Dr. Leonia Reeves, MD Pager: [phone]       After Hours (After 5p/  First Contact Pager: [phone]  weekends / holidays): Second Contact Pager: [phone]   Chief Complaint: N/V/D  History of Present Illness: Raymond Winters is a 53 y.o. man with a pmhx of IVDU (heroin), DM, and alcoholism. History was limited by patient's mental status which was diminished since being given 1 mg ativan IV in ED. History was obtained by chart review and speaking directly with patients life partner.  reports that he did not feel well yesterday and was unable to take his daily heroin dose. Apparently he uses about 1 g of heroin daily, which seems improbable as this is equivalent to ~1,250 mg of daily IV morphine and would cost ~$6,000 per month. Since missing that dose, he has developed diarrhea and subsequently nausea and vomiting. He has associated symptoms of abdominal cramping. He also has not been compliant with his home diabetes medications because he cannot tolerate anything PO. Patient is requesting methodone to help with his w/d. CBG was found to be >600 and BMP was signifiant for hyperkalemia at 5.5. IMTS was consulted to admit the patient for hyperglycemia and hyperkalemia and intractable nausea and vomiting.    No fevers, SOB CP. Positive for chills.   Meds: Current Facility-Administered Medications  Medication Dose Route Frequency Provider Last Rate Last Dose  . calcium gluconate 1 g in sodium chloride 0.9 % 100 mL IVPB  1 g Intravenous Once Flint Melter, MD      . dextrose 5 %-0.45 % sodium chloride infusion   Intravenous Continuous Flint Melter, MD      . insulin aspart  (novoLOG) injection 10 Units  10 Units Intravenous Once Flint Melter, MD      . insulin regular (NOVOLIN R,HUMULIN R) 1 Units/mL in sodium chloride 0.9 % 100 mL infusion   Intravenous Continuous Tiffany Irine Seal, PA-C      . sodium bicarbonate 100 mEq in dextrose 5 % 1,000 mL infusion   Intravenous Once Flint Melter, MD      . sodium chloride 0.9 % bolus 1,000 mL  1,000 mL Intravenous Once Dorthula Matas, PA-C 1,000 mL/hr at 12/04/13 2138 1,000 mL at 12/04/13 2138   Current Outpatient Prescriptions  Medication Sig Dispense Refill  . glipiZIDE (GLUCOTROL) 5 MG tablet Take 5 mg by mouth 2 (two) times daily before a meal.      . metFORMIN (GLUCOPHAGE) 500 MG tablet Take 500 mg by mouth 2 (two) times daily with a meal.      . Multiple Vitamin (MULTIVITAMIN WITH MINERALS) TABS tablet Take 1 tablet by mouth daily.        Allergies: Allergies as of 12/04/2013  . (No Known Allergies)   Past Medical History  Diagnosis Date  . Heroin abuse   . Diabetes mellitus without complication    Past Surgical History  Procedure Laterality Date  . I&d extremity Right 03/20/2013    Procedure: IRRIGATION AND DEBRIDEMENT  EXTREMITY;  Surgeon: Marlowe ShoresMatthew A Weingold, MD;  [facility];  Service: Orthopedics;  Laterality: Right;   History reviewed. No pertinent family history. History   Social History  . Marital Status: Single    Spouse Name: N/A    Number of Children: N/A  . Years of Education: N/A   Occupational History  . Not on file.   Social History Main Topics  . Smoking status: Current Every Day Smoker -- 1.10 packs/day for 11 years  . Smokeless tobacco: Never Used     Comment: encouraged to quit  . Alcohol Use: Yes  . Drug Use: Yes    Special: IV     Comment: heroin  . Sexual Activity: Not on file   Other Topics Concern  . Not on file   Social History Narrative  . No narrative on file    Review of Systems: Pertinent items are noted in HPI.  Physical Exam: Blood pressure  128/99, pulse 120, temperature 98.5 F (36.9 C), temperature source Oral, resp. rate 25, SpO2 97.00%. Physical Exam  Constitutional:  Frail man appears older than stated age.  HENT:  Dry mucous membranes  Eyes:  Miotic pupils that a briskly reactive.   Cardiovascular: Normal rate, regular rhythm, normal heart sounds and intact distal pulses.  Exam reveals no friction rub.   No murmur heard. Pulmonary/Chest: Effort normal and breath sounds normal. No respiratory distress. He has no wheezes. He has no rales.  Abdominal: Soft. Bowel sounds are normal. He exhibits no distension. There is no tenderness. There is no rebound and no guarding.  Musculoskeletal: He exhibits no edema and no tenderness.  Neurological:  arousable to voice.     Lab results: Basic Metabolic Panel:  Recent Labs  40/98/1106/15/15 1758  NA 129*  K 5.5*  CL 85*  CO2 20  GLUCOSE 607*  BUN 16  CREATININE 0.97  CALCIUM 10.8*   Liver Function Tests:  Recent Labs  12/04/13 1758  AST 65*  ALT 48  ALKPHOS 284*  BILITOT 1.4*  PROT 8.1  ALBUMIN 3.5   CBC:  Recent Labs  12/04/13 2140  WBC 4.8  NEUTROABS 2.7  HGB 13.0  HCT 37.2*  MCV 77.2*  PLT PENDING   CBG:  Recent Labs  12/04/13 2133  GLUCAP 532*   Urine Drug Screen: Drugs of Abuse     Component Value Date/Time   LABOPIA POSITIVE* 04/15/2007 1433   COCAINSCRNUR POSITIVE* 04/15/2007 1433   LABBENZ NONE DETECTED 04/15/2007 1433   AMPHETMU NONE DETECTED 04/15/2007 1433   THCU NONE DETECTED 04/15/2007 1433   LABBARB  Value: NONE DETECTED        DRUG SCREEN FOR MEDICAL PURPOSES ONLY.  IF CONFIRMATION IS NEEDED FOR ANY PURPOSE, NOTIFY LAB WITHIN 5 DAYS. 04/15/2007 1433    Alcohol Level:  Recent Labs  12/04/13 1948  ETH 119*   Urinalysis: No results found for this basename: COLORURINE, APPERANCEUR, LABSPEC, PHURINE, GLUCOSEU, HGBUR, BILIRUBINUR, KETONESUR, PROTEINUR, UROBILINOGEN, NITRITE, LEUKOCYTESUR,  in the last 72 hours  Other  results: EKG: peaked t waves and minimal st elevation consistent with early repol. No qrs widening.  Assessment & Plan by Problem: Principal Problem:   Heroin withdrawal Active Problems:   DM2 (diabetes mellitus, type 2)   Heroin abuse   Hyperkalemia   Alcohol abuse   Hyperglycemia  Heroin Withdrawal The patients primary problem appears to be heroin withdrawal. As he is resting comfortably and sedated via IV ativan, I plan to not use additional  medications that may worsen his depressed mental status. Once his mental status imrpoves, I will consider adding 1 dose of methadone  (10 mg IM). Further, I will plan to add clonidine 0.1 mg q 1 hr until symptoms controlled.   Hyperglycemia complicated by hyperkalemia and increased anion gap. The patients hyperglycemia is likely due to his heroin w/d. He received 2 L NS in ED. Plan to start patient on glucommander protocol. Cardiology was consulted by ED for EKG significant for peaked t waves. The hyperkalemia was mild and cardiology does not believe that he needs cardiology intervention at this time.  - BMP q 6 hr x 3 - CBG q 1 hr per protocol - NS at 250 cc /hr - f/u UA - admit SDU  Abdominal Pain Likely due to N/V as a result of w/d. Will f/u lipase.   Triple Acid Base Disturbance The patient has increased anion gap metabolic acidosis (24) likely due to starvation ketoacidosis and hyperglycemia. He appears to have a respiratory alkalosis due to hyperventilation from withdrawal. Lastly, his delta delta is 14, which would predict a bicarb of 10. The respiratory alkalosis is predicted to increase his bicarb to 2-3. He has a bicarb of 19. Therefore, he likely has H+ ion loss due to intractable vomiting. Hence the triple acid base disturbance. Plans above will address.   Alcohol Withdrawal Patient's last alcohol was 24 hours to admission. It is unclear how much he drinks daily. His life partner states that he drinks three large beers daily and  occasionally half a pint of liquor. I suspect that this may be an underestimate. Plan for thiamine, folate. - CIWA protocol  Hep C Positive in 2014 - F/u Hep panel - F/U Hep C viral load   Nutrition: NPO  DVT: Lovenox  Dispo: Disposition is deferred at this time, awaiting improvement of current medical problems. Anticipated discharge in approximately 1-3 day(s).   The patient does have a current PCP (Chinwe Micael HampshireN [place], MD) and [facility] after discharge.  The patient does not have transportation limitations that hinder transportation to clinic appointments.  Signed: Pleas Kochhristopher Kaidynce Pfister, MD 12/04/2013, 10:33 PM

## 2013-12-04 NOTE — ED Provider Notes (Signed)
  Face-to-face evaluation   History: He feels dehydrated. He has had diarrhea. He denies Chest Pain. He c/o weakness on standing  Physical exam: Frail, calm, lucid. Abdomen soft, nontender, nondistended. Chest nontender to palpation. Heart tachycardic.   EKG Interpretation  Date/Time:  Monday December 04 2013 21:18:32 EDT Ventricular Rate:  90 PR Interval:  157 QRS Duration: 89 QT Interval:  376 QTC Calculation: 460 R Axis:   53 Text Interpretation:  Sinus tachycardia pEAKED t WAVES Low voltage, extremity leads ST elev, probable normal early repol pattern Since last tracing  ST ABNORMALITY AND T WAVE ABNORMALITY ARE NEW Confirmed by Effie ShyWENTZ  MD, Nishant Schrecengost ([id number]) on 12/04/2013 9:26:39 PM       Medications  sodium chloride 0.9 % bolus 1,000 mL (not administered)  sodium chloride 0.9 % bolus 1,000 mL (1,000 mLs Intravenous New Bag/Given 12/04/13 2038)  insulin regular (NOVOLIN R,HUMULIN R) 1 Units/mL in sodium chloride 0.9 % 100 mL infusion (not administered)  insulin aspart (novoLOG) injection 10 Units (not administered)  calcium gluconate 1 g in sodium chloride 0.9 % 100 mL IVPB (not administered)  insulin aspart (novoLOG) injection 10 Units (not administered)  sodium bicarbonate 100 mEq in dextrose 5 % 1,000 mL infusion (not administered)  dextrose 5 %-0.45 % sodium chloride infusion (not administered)  insulin regular (NOVOLIN R,HUMULIN R) 1 Units/mL in sodium chloride 0.9 % 100 mL infusion (not administered)  ondansetron (ZOFRAN) injection 4 mg (4 mg Intravenous Given 12/04/13 2038)  LORazepam (ATIVAN) injection 1 mg (1 mg Intravenous Given 12/04/13 2049)    Patient Vitals for the past 24 hrs:  BP Temp Temp src Pulse Resp SpO2  12/04/13 2115 128/99 mmHg - - - 25 -  12/04/13 2100 147/92 mmHg - - - - -  12/04/13 2030 123/86 mmHg - - - 22 -  12/04/13 1945 107/84 mmHg - - - 29 -  12/04/13 1751 92/66 mmHg 98.5 F (36.9 C) Oral 120 18 97 %   21:35- Cardiology Consultation, to discuss  abnormal EKG.      CRITICAL CARE Performed by: Flint MelterWENTZ,Shontez Sermon L Total critical care time: 45 minutes Critical care time was exclusive of separately billable procedures and treating other patients. Critical care was necessary to treat or prevent imminent or life-threatening deterioration. Critical care was time spent personally by me on the following activities: development of treatment plan with patient and/or surrogate as well as nursing, discussions with consultants, evaluation of patient's response to treatment, examination of patient, obtaining history from patient or surrogate, ordering and performing treatments and interventions, ordering and review of laboratory studies, ordering and review of radiographic studies, pulse oximetry and re-evaluation of patient's condition.  Medical screening examination/treatment/procedure(s) were conducted as a shared visit with non-physician practitioner(s) and myself.  I personally evaluated the patient during the encounter  Flint MelterElliott L Caylor Cerino, MD 12/05/13 1248

## 2013-12-04 NOTE — ED Notes (Signed)
[facility].

## 2013-12-04 NOTE — ED Notes (Signed)
Contacted lab to check on CBC.  Was told the first sample was clotted; will redraw.

## 2013-12-04 NOTE — ED Notes (Signed)
Report attempted 

## 2013-12-04 NOTE — ED Notes (Signed)
Pt reports he has not felt well, unable to get out of bed to go get his usual dose of daily heroin (one gm reported) today due to extreme weakness.  Has not taken his diabetic medications in approx one week and has not checked his CBG in over a week.  Last used heroin yesterday, last drank alcohol yesterday.  Reports he is "sick" from not using heroin today.  Monitor shows ST, rate 110, peaked T waves.  Pt asking for "something to help me from being sick, like methadone."  Was made aware we do not have that in the ED but the PA would assess him and give him medication as necessary to keep him from DT/sickness.

## 2013-12-04 NOTE — ED Notes (Signed)
Pt peaked T waves causing double count on HR at times.  Current HR 80 apical. Pt sleepy. Mini lab in room to redraw CBC.

## 2013-12-04 NOTE — Significant Event (Signed)
I was asked to review an EKG. I have NOT seen the patient. Pt is reportedly CP free.   EKG with; Sinus rhythm, normal axis, peaked T waves and minimal ST segment elevation in inferior leads - this is related to repolarization abnormalities due to hyperkaliemia and is very unlikely to represent a sign of myocardial ischemia.   Consider repeating EKG once metabolic abnormalities are adequately corrected

## 2013-12-04 NOTE — ED Notes (Addendum)
Pt in c/o n/v/d over the last few days, pt states he is a heroin user and has not had any in a few days also, c/o generalized body aches, pt with history of pancreatitis

## 2013-12-04 NOTE — ED Provider Notes (Signed)
CSN: [id number]     Arrival date & time 12/04/13  1733 History   First MD Initiated Contact with Patient 12/04/13 1929     Chief Complaint  Patient presents with  . N/V/D      (Consider location/radiation/quality/duration/timing/severity/associated sxs/prior Treatment) HPI  Patient to the ER with complaints of N/V/D for the past 2-3 days. He is a daily heroine user, a non compliant diabetic, alcoholic. He reports that he started not to feel well yesterday and then missed his daily use of heroine today since he was not feeling well. Since he has been having diarrhea and the vomiting with abdominal cramping. Last alcoholic beverage was last night. He is tachycardic at 120 and appears to not be feeling well. Requests methadone to help his symptoms. Pt denies SOB, CP, abdominal pains, headache, confusion. Denies SI/HI or hallucinations. Denies wanting detox.  Past Medical History  Diagnosis Date  . Heroin abuse   . Diabetes mellitus without complication    Past Surgical History  Procedure Laterality Date  . I&d extremity Right 03/20/2013    Procedure: IRRIGATION AND DEBRIDEMENT EXTREMITY;  Surgeon: Marlowe ShoresMatthew A Weingold, MD;  Location: [facility] OR;  Service: Orthopedics;  Laterality: Right;   History reviewed. No pertinent family history. History  Substance Use Topics  . Smoking status: Current Every Day Smoker -- 1.10 packs/day for 11 years  . Smokeless tobacco: Never Used     Comment: encouraged to quit  . Alcohol Use: Yes    Review of Systems   Review of Systems  Gen: no weight loss, fevers, chills, night sweats  Eyes: no discharge or drainage, no occular pain or visual changes  Nose: no epistaxis or rhinorrhea  Mouth: no dental pain, no sore throat  Neck: no neck pain  Lungs:No wheezing, coughing or hemoptysis CV: no chest pain, palpitations, dependent edema or orthopnea  Abd: no abdominal pain, + nausea, vomiting, diarrhea GU: no dysuria or gross hematuria  MSK:  No muscle  weakness or pain Neuro: no headache, no focal neurologic deficits  Skin: no rash or wounds Psyche: + substance abuse    Allergies  Review of patient's allergies indicates no known allergies.  Home Medications   Prior to Admission medications   Medication Sig Start Date End Date Taking? Authorizing Provider  glipiZIDE (GLUCOTROL) 5 MG tablet Take 5 mg by mouth 2 (two) times daily before a meal.   Yes Historical Provider, MD  metFORMIN (GLUCOPHAGE) 500 MG tablet Take 500 mg by mouth 2 (two) times daily with a meal.   Yes Historical Provider, MD  Multiple Vitamin (MULTIVITAMIN WITH MINERALS) TABS tablet Take 1 tablet by mouth daily.   Yes Historical Provider, MD   BP 128/99  Pulse 120  Temp(Src) 98.5 F (36.9 C) (Oral)  Resp 25  SpO2 97% Physical Exam  Nursing note and vitals reviewed. Constitutional: He appears well-developed and well-nourished. He appears ill. No distress.  HENT:  Head: Normocephalic and atraumatic.  Eyes: Pupils are equal, round, and reactive to light.  Neck: Normal range of motion. Neck supple.  Cardiovascular: Normal rate and regular rhythm.   Pulmonary/Chest: Effort normal.  Abdominal: Soft. There is tenderness (mild).  Neurological: He is alert.  Skin: Skin is warm and dry.    ED Course  Procedures (including critical care time) Labs Review Labs Reviewed  COMPREHENSIVE METABOLIC PANEL - Abnormal; Notable for the following:    Sodium 129 (*)    Potassium 5.5 (*)    Chloride 85 (*)  Glucose, Bld 607 (*)    Calcium 10.8 (*)    AST 65 (*)    Alkaline Phosphatase 284 (*)    Total Bilirubin 1.4 (*)    All other components within normal limits  ETHANOL - Abnormal; Notable for the following:    Alcohol, Ethyl (B) 119 (*)    All other components within normal limits  CBC WITH DIFFERENTIAL - Abnormal; Notable for the following:    HCT 37.2 (*)    MCV 77.2 (*)    All other components within normal limits  CBG MONITORING, ED - Abnormal; Notable  for the following:    Glucose-Capillary 532 (*)    All other components within normal limits  I-STAT ARTERIAL BLOOD GAS, ED - Abnormal; Notable for the following:    pCO2 arterial 28.4 (*)    Bicarbonate 18.8 (*)    Acid-base deficit 4.0 (*)    All other components within normal limits  CBC WITH DIFFERENTIAL  URINE RAPID DRUG SCREEN (HOSP PERFORMED)  KETONES, URINE  CBG MONITORING, ED    Imaging Review No results found.   EKG Interpretation   Date/Time:  Monday December 04 2013 21:18:32 EDT Ventricular Rate:  90 PR Interval:  157 QRS Duration: 89 QT Interval:  376 QTC Calculation: 460 R Axis:   53 Text Interpretation:  Sinus tachycardia pEAKED t WAVES Low voltage,  extremity leads ST elev, probable normal early repol pattern Since last  tracing  ST ABNORMALITY AND T WAVE ABNORMALITY ARE NEW Confirmed by Effie ShyWENTZ   MD, Mechele CollinELLIOTT ([id number]) on 12/04/2013 9:26:39 PM      MDM   Final diagnoses:  Hyperkalemia  Heroin withdrawal  Nausea vomiting and diarrhea  Diabetic acidosis    CRITICAL CARE Performed by: Dorthula MatasGREENE,Jahmani Staup G Total critical care time:  30 Critical care time was exclusive of separately billable procedures and treating other patients. Critical care was necessary to treat or prevent imminent or life-threatening deterioration. Critical care was time spent personally by me on the following activities: development of treatment plan with patient and/or surrogate as well as nursing, discussions with consultants, evaluation of patient's response to treatment, examination of patient, obtaining history from patient or surrogate, ordering and performing treatments and interventions, ordering and review of laboratory studies, ordering and review of radiographic studies, pulse oximetry and re-evaluation of patient's condition.  glucostabilizer started for patient, he has a glucose of greater than 600 in the ED. Admits to none compliance and has been using heroine and drinking alcohol.  He is now withdrawing from heroine and acidotic from an elevated glucose. Gap of 25.   2 liter bolus given, waiting for labs to return than will admit patient.  Patient has some EKG changes, dr. Effie Shywentz has ordered medications to treat this and has spoke with Cardiology who will put in a note.   Internal medicine residents have agreed to admit patient. Will start patient out in stepdown.   Dorthula Matasiffany G Ammy Lienhard, PA-C 12/04/13 2228

## 2013-12-05 ENCOUNTER — Encounter (HOSPITAL_COMMUNITY): Payer: Self-pay | Admitting: General Practice

## 2013-12-05 DIAGNOSIS — E43 Unspecified severe protein-calorie malnutrition: Secondary | ICD-10-CM | POA: Insufficient documentation

## 2013-12-05 DIAGNOSIS — E872 Acidosis, unspecified: Secondary | ICD-10-CM

## 2013-12-05 DIAGNOSIS — E119 Type 2 diabetes mellitus without complications: Secondary | ICD-10-CM

## 2013-12-05 DIAGNOSIS — F1120 Opioid dependence, uncomplicated: Secondary | ICD-10-CM

## 2013-12-05 DIAGNOSIS — F1010 Alcohol abuse, uncomplicated: Secondary | ICD-10-CM

## 2013-12-05 DIAGNOSIS — E873 Alkalosis: Secondary | ICD-10-CM

## 2013-12-05 LAB — HEPATITIS PANEL, ACUTE
HCV Ab: REACTIVE — AB
HEP B S AG: NEGATIVE
Hep A IgM: NONREACTIVE
Hep B C IgM: NONREACTIVE

## 2013-12-05 LAB — LIPASE, BLOOD: Lipase: 13 U/L (ref 11–59)

## 2013-12-05 LAB — BASIC METABOLIC PANEL
BUN: 14 mg/dL (ref 6–23)
BUN: 14 mg/dL (ref 6–23)
CALCIUM: 9.1 mg/dL (ref 8.4–10.5)
CO2: 23 mEq/L (ref 19–32)
CO2: 28 meq/L (ref 19–32)
Calcium: 8.9 mg/dL (ref 8.4–10.5)
Chloride: 96 mEq/L (ref 96–112)
Chloride: 100 mEq/L (ref 96–112)
Creatinine, Ser: 0.78 mg/dL (ref 0.50–1.35)
Creatinine, Ser: 0.82 mg/dL (ref 0.50–1.35)
GFR calc Af Amer: >90 mL/min (ref >90)
GFR calc non Af Amer: >90 mL/min (ref >90)
GLUCOSE: 75 mg/dL (ref 70–99)
Glucose, Bld: 163 mg/dL — ABNORMAL HIGH (ref 70–99)
Potassium: 4.1 mEq/L (ref 3.7–5.3)
Potassium: 3.6 mEq/L — ABNORMAL LOW (ref 3.7–5.3)
SODIUM: 134 meq/L — AB (ref 137–147)
SODIUM: 139 meq/L (ref 137–147)

## 2013-12-05 LAB — GLUCOSE, CAPILLARY
GLUCOSE-CAPILLARY: 210 mg/dL — AB (ref 70–99)
GLUCOSE-CAPILLARY: 176 mg/dL — AB (ref 70–99)
GLUCOSE-CAPILLARY: 154 mg/dL — AB (ref 70–99)
GLUCOSE-CAPILLARY: 162 mg/dL — AB (ref 70–99)
GLUCOSE-CAPILLARY: 283 mg/dL — AB (ref 70–99)
Glucose-Capillary: 85 mg/dL (ref 70–99)
Glucose-Capillary: 139 mg/dL — ABNORMAL HIGH (ref 70–99)
Glucose-Capillary: 208 mg/dL — ABNORMAL HIGH (ref 70–99)
Glucose-Capillary: 115 mg/dL — ABNORMAL HIGH (ref 70–99)
Glucose-Capillary: 112 mg/dL — ABNORMAL HIGH (ref 70–99)
Glucose-Capillary: 170 mg/dL — ABNORMAL HIGH (ref 70–99)
Glucose-Capillary: 330 mg/dL — ABNORMAL HIGH (ref 70–99)

## 2013-12-05 LAB — RAPID URINE DRUG SCREEN, HOSP PERFORMED
Amphetamines: NOT DETECTED
Barbiturates: NOT DETECTED
Benzodiazepines: NOT DETECTED
Cocaine: NOT DETECTED
Opiates: NOT DETECTED
Tetrahydrocannabinol: NOT DETECTED

## 2013-12-05 LAB — HIV ANTIBODY (ROUTINE TESTING W REFLEX): HIV: NONREACTIVE

## 2013-12-05 LAB — URINALYSIS, ROUTINE W REFLEX MICROSCOPIC
BILIRUBIN URINE: NEGATIVE
Glucose, UA: >1000 mg/dL — AB
HGB URINE DIPSTICK: NEGATIVE
Ketones, ur: NEGATIVE mg/dL
Leukocytes, UA: NEGATIVE
Nitrite: NEGATIVE
PH: 6 (ref 5.0–8.0)
Protein, ur: NEGATIVE mg/dL
SPECIFIC GRAVITY, URINE: 1.027 (ref 1.005–1.030)
Urobilinogen, UA: 0.2 mg/dL (ref 0.0–1.0)

## 2013-12-05 LAB — URINE MICROSCOPIC-ADD ON

## 2013-12-05 LAB — MRSA PCR SCREENING: MRSA by PCR: NEGATIVE

## 2013-12-05 MED ORDER — INSULIN ASPART 100 UNIT/ML ~~LOC~~ SOLN
0.0000 [IU] | Freq: Every day | SUBCUTANEOUS | Status: DC
  Administered 2013-12-05: 3 [IU] via SUBCUTANEOUS

## 2013-12-05 MED ORDER — INSULIN GLARGINE 100 UNIT/ML ~~LOC~~ SOLN
10.0000 [IU] | Freq: Every day | SUBCUTANEOUS | Status: DC
  Administered 2013-12-05 – 2013-12-06 (×2): 10 [IU] via SUBCUTANEOUS
  Filled 2013-12-05 (×2): qty 0.1

## 2013-12-05 MED ORDER — ENOXAPARIN SODIUM 40 MG/0.4ML ~~LOC~~ SOLN
40.0000 mg | SUBCUTANEOUS | Status: DC
  Administered 2013-12-05 – 2013-12-06 (×2): 40 mg via SUBCUTANEOUS
  Filled 2013-12-05 (×2): qty 0.4

## 2013-12-05 MED ORDER — ACETAMINOPHEN 325 MG PO TABS: 650.0000 mg | ORAL_TABLET | Freq: Four times a day (QID) | ORAL | Status: DC | PRN

## 2013-12-05 MED ORDER — POTASSIUM CHLORIDE 10 MEQ/100ML IV SOLN
10.0000 meq | INTRAVENOUS | Status: DC
Start: 2013-12-05 — End: 2013-12-05

## 2013-12-05 MED ORDER — ADULT MULTIVITAMIN W/MINERALS CH
1.0000 | ORAL_TABLET | Freq: Every day | ORAL | Status: DC
  Administered 2013-12-05 – 2013-12-06 (×2): 1 via ORAL
  Filled 2013-12-05 (×2): qty 1

## 2013-12-05 MED ORDER — GLUCERNA SHAKE PO LIQD
237.0000 mL | Freq: Two times a day (BID) | ORAL | Status: DC
  Administered 2013-12-05 – 2013-12-06 (×2): 237 mL via ORAL

## 2013-12-05 MED ORDER — VITAMIN B-1 100 MG PO TABS
100.0000 mg | ORAL_TABLET | Freq: Every day | ORAL | Status: DC
  Administered 2013-12-06: 100 mg via ORAL
  Filled 2013-12-05 (×2): qty 1

## 2013-12-05 MED ORDER — LORAZEPAM 1 MG PO TABS
1.0000 mg | ORAL_TABLET | Freq: Four times a day (QID) | ORAL | Status: DC | PRN
  Administered 2013-12-05: 1 mg via ORAL
  Filled 2013-12-05: qty 1

## 2013-12-05 MED ORDER — ACETAMINOPHEN 650 MG RE SUPP: 650.0000 mg | Freq: Four times a day (QID) | RECTAL | Status: DC | PRN

## 2013-12-05 MED ORDER — INSULIN ASPART 100 UNIT/ML ~~LOC~~ SOLN
0.0000 [IU] | Freq: Three times a day (TID) | SUBCUTANEOUS | Status: DC
  Administered 2013-12-05: 2 [IU] via SUBCUTANEOUS
  Administered 2013-12-05: 7 [IU] via SUBCUTANEOUS

## 2013-12-05 MED ORDER — DEXTROSE 50 % IV SOLN: 25.0000 mL | INTRAVENOUS | Status: DC | PRN

## 2013-12-05 MED ORDER — POTASSIUM CHLORIDE CRYS ER 20 MEQ PO TBCR
40.0000 meq | EXTENDED_RELEASE_TABLET | Freq: Once | ORAL | Status: AC
  Administered 2013-12-05: 40 meq via ORAL
  Filled 2013-12-05: qty 2

## 2013-12-05 MED ORDER — ONDANSETRON HCL 4 MG PO TABS: 4.0000 mg | ORAL_TABLET | Freq: Four times a day (QID) | ORAL | Status: DC | PRN

## 2013-12-05 MED ORDER — FOLIC ACID 1 MG PO TABS
1.0000 mg | ORAL_TABLET | Freq: Every day | ORAL | Status: DC
  Administered 2013-12-05 – 2013-12-06 (×2): 1 mg via ORAL
  Filled 2013-12-05 (×2): qty 1

## 2013-12-05 MED ORDER — SODIUM CHLORIDE 0.9 % IJ SOLN
3.0000 mL | Freq: Two times a day (BID) | INTRAMUSCULAR | Status: DC
Start: 2013-12-05 — End: 2013-12-07
  Administered 2013-12-05 – 2013-12-06 (×2): 3 mL via INTRAVENOUS

## 2013-12-05 MED ORDER — ONDANSETRON HCL 4 MG/2ML IJ SOLN: 4.0000 mg | Freq: Four times a day (QID) | INTRAMUSCULAR | Status: DC | PRN

## 2013-12-05 MED ORDER — INSULIN ASPART 100 UNIT/ML ~~LOC~~ SOLN
0.0000 [IU] | Freq: Three times a day (TID) | SUBCUTANEOUS | Status: DC
  Administered 2013-12-06: 7 [IU] via SUBCUTANEOUS
  Administered 2013-12-06: 3 [IU] via SUBCUTANEOUS

## 2013-12-05 MED ORDER — LORAZEPAM 2 MG/ML IJ SOLN
1.0000 mg | Freq: Four times a day (QID) | INTRAMUSCULAR | Status: DC | PRN
  Administered 2013-12-05 – 2013-12-06 (×2): 1 mg via INTRAVENOUS
  Filled 2013-12-05 (×2): qty 1

## 2013-12-05 MED ORDER — DEXTROSE-NACL 5-0.45 % IV SOLN
INTRAVENOUS | Status: DC
  Administered 2013-12-05: 500 mL via INTRAVENOUS
  Administered 2013-12-05: 01:00:00 via INTRAVENOUS

## 2013-12-05 MED ORDER — POTASSIUM CHLORIDE 10 MEQ/100ML IV SOLN: 10.0000 meq | INTRAVENOUS | Status: DC

## 2013-12-05 NOTE — H&P (Signed)
INTERNAL MEDICINE TEACHING ATTENDING NOTE  Day 1 of stay  Patient name: Raymond Winters  MRN: [id number] Date of birth: 1961/02/06   53 y.o.man with heroin withdrawal, uncontrolled diabetes mellitus, excessive alcohol use, who presented yesterday with electrolyte abnormalities and mixed acid base disturbances admitted to step down unit.    Filed Vitals:   12/05/13 0000 12/05/13 0030 12/05/13 0403 12/05/13 0825  BP: 134/93 134/93 132/86 115/73  Pulse: 106  89 84  Temp:  98.5 F (36.9 C) 98.5 F (36.9 C) 98.3 F (36.8 C)  TempSrc:  Oral Oral Axillary  Resp:   18 19  Height:  5\' 7"  (1.702 m)    Weight:  112 lb 14 oz (51.2 kg) 112 lb 14 oz (51.2 kg)   SpO2: 100% 100% 100% 100%    Physical Exam  He feels subjectively better. No pain reported anywhere in his body. No more diarrhea since admission. Feels anxious.  General: Resting in bed. Alert.  HEENT: PERRL (miosis resolved), EOMI, no scleral icterus. Heart: RRR, no rubs, murmurs or gallops. Lungs: Clear to auscultation bilaterally, no wheezes, rales, or rhonchi. Abdomen: Soft, nontender, nondistended, BS present. Extremities: Feeble TP and DP, cool feet bilaterally, no pedal edema, no wounds or leg pain. Neuro: Alert and oriented X3, cranial nerves II-XII grossly intact,  strength and sensation to light touch equal in bilateral upper and lower extremities   Labs    Recent Labs Lab 12/04/13 1758 12/05/13 0245 12/05/13 0715  NA 129* 134* 139  K 5.5* 4.1 3.6*  CL 85* 96 100  CO2 20 23 28   GLUCOSE 607* 163* 75  BUN 16 14 14   CREATININE 0.97 0.78 0.82  CALCIUM 10.8* 9.1 8.9    Recent Labs Lab 12/04/13 2140  HGB 13.0  HCT 37.2*  WBC 4.8  PLT 80*    Assessment and Plan   Heroin/opoid/alcohol withdrawal - It seems that Raymond Winters is already over the peak withdrawal symptoms and is recovering now. CIWA has been zero. The patient would like to stop taking heroin and voiced this himself, thus I would engage social workers and get  him a follow up in a methadone maintenance clinic or rehab center.   Other issues - Hyperglycemia and acid base imbalances resolved. No more nausea vomiting or abdominal pain. Agree with follow up on Hep C.   I have seen and evaluated this patient and discussed it with my IM resident team.  Please see the rest of the plan per resident note from today.   Makiah Foye 12/05/2013, 10:21 AM.

## 2013-12-05 NOTE — Progress Notes (Signed)
NURSING PROGRESS NOTE  Larinda ButteryGary Minkler [id number] Transfer Data: 12/05/2013 4:47 PM Attending Provider: Aletta EdouardShilpa Bhardwaj, MD UVO:[id number]:[place], Julius Bowelshinwe N, MD Code Status: Full Code   Larinda ButteryGary Haliburton is a 53 y.o. male patient transferred from 2c  -No acute distress noted.  -No complaints of shortness of breath.  -No complaints of chest pain.    Last Documented Vital Signs: Blood pressure 110/87, pulse 101, temperature 97.4 F (36.3 C), temperature source Oral, resp. rate 18, height 5\' 7"  (1.702 m), weight 53.6 kg (118 lb 2.7 oz), SpO2 99.00%.  IV Fluids:  IV in place, occlusive dsg intact without redness, IV cath forearm left, condition patent and no redness and antecubital left, condition patent and no redness none.   Allergies:  Review of patient's allergies indicates no known allergies.  Past Medical History:   has a past medical history of Heroin abuse and Diabetes mellitus without complication.  Past Surgical History:   has past surgical history that includes I&D extremity (Right, 03/20/2013).  Social History:   reports that he has been smoking.  He has never used smokeless tobacco. He reports that he drinks alcohol. He reports that he uses illicit drugs (IV).  Skin: intact. Has track marks on arms from heroin use.  Patient/Family orientated to room. Information packet given to patient/family. Admission inpatient armband information verified with patient/family to include name and date of birth and placed on patient arm. Side rails up x 2, fall assessment and education completed with patient/family. Patient/family able to verbalize understanding of risk associated with falls and verbalized understanding to call for assistance before getting out of bed. Call light within reach. Patient/family able to voice and demonstrate understanding of unit orientation instructions.

## 2013-12-05 NOTE — Progress Notes (Addendum)
Report called to [employer]drien RN on unit 5W. Patient to be transported via wheelchair. Sister Sonny Mastersnsogna made aware of patients transfer to [phone].

## 2013-12-05 NOTE — Discharge Summary (Signed)
Name: Raymond Winters MRN: [id number] DOB: 07-08-1960 53 y.o. PCP: Gwendolyn Fill, MD  Date of Admission: 12/04/2013  7:23 PM Date of Discharge: 12/05/2013 Attending Physician: Aletta Edouard, MD  Discharge Diagnosis: Principal Problem:   Heroin withdrawal Active Problems:   DM2 (diabetes mellitus, type 2)   Heroin abuse   Hyperkalemia   Alcohol abuse   Hyperglycemia   Metabolic acidosis with respiratory alkalosis   Protein-calorie malnutrition, severe  Discharge Medications:   Medication List         feeding supplement (GLUCERNA SHAKE) Liqd  Take 237 mLs by mouth 2 (two) times daily between meals.     glipiZIDE 5 MG tablet  Commonly known as:  GLUCOTROL  Take 1 tablet (5 mg total) by mouth 2 (two) times daily before a meal.     metFORMIN 1000 MG tablet  Commonly known as:  GLUCOPHAGE  Take 1 tablet (1,000 mg total) by mouth 2 (two) times daily with a meal.     multivitamin with minerals Tabs tablet  Take 1 tablet by mouth daily.        Disposition and follow-up:   Mr.Raymond Winters was discharged from [facility] in Stable condition.  At the hospital follow up visit please address:  1. Polysubstance Abuse: Patient's sister to try to help get treatment at [facility].  2. DM2: Patient presented hyperglycemic after stopping his oral hypoglycemic medications, he was resumed on these at discharge (instructed to increase Metformin to 1g BID).  Please check A1c to check home glycemic control  3. ? Melena: prior to discharge patient reported history of Melena that resolved prior to admission.  Hgb and vital signs stable in hospital.  Consider FOBT as outpatient and repeating CBC  2.  Labs / imaging needed at time of follow-up: CBC, BMP, HgbA1c, FOBT  3.  Pending labs/ test needing follow-up: None  Follow-up Appointments:   Discharge Instructions:   Consultations:    Procedures Performed:  No results found.  Admission HPI: Raymond Winters is a 53 y.o.  man with a pmhx of IVDU (heroin), DM, and alcoholism. History was limited by patient's mental status which was diminished since being given 1 mg ativan IV in ED. History was obtained by chart review and speaking directly with patients life partner. reports that he did not feel well yesterday and was unable to take his daily heroin dose. Apparently he uses about 1 g of heroin daily, which seems improbable as this is equivalent to ~1,250 mg of daily IV morphine and would cost ~$6,000 per month. Since missing that dose, he has developed diarrhea and subsequently nausea and vomiting. He has associated symptoms of abdominal cramping. He also has not been compliant with his home diabetes medications because he cannot tolerate anything PO. Patient is requesting methodone to help with his w/d. CBG was found to be >600 and BMP was signifiant for hyperkalemia at 5.5. IMTS was consulted to admit the patient for hyperglycemia and hyperkalemia and intractable nausea and vomiting.    Hospital Course by problem list: Heroin withdrawal/ Heroin abuse  Patient presented with complaints of nausea vomiting and diarrhea.  He admits daily heroin use and reported that his family had stopped helping him obtain heroin 3 days prior.  He was sedated with Ativan in the ED.  On hospital day 1 his symptoms had resolved and he was given no further medications for heroin withdraw.  He reportedly uses ~$60 a day of heroin. He notes that he  has previously been a part of a Methadone clinic and reports a desire to stop using and his sister note they want to help support him.  He was discharged to his sister Barbara's care who reported she will call to get him into the [place] [facility] treatment program.  DM2 (diabetes mellitus, type 2) with Hyperglycemia  - Patient was initially hyperglycemic with CBG >600, after stopping his oral hypoglycemic medications at home. He was started on an insulin drip which controlled his sugars.  He was  transitioned to 10 units of Lantus daily with a Sliding scale regimen.  His Sugars were improved but not yet to goal ~low 200s.  He will resume his oral hypoglycemics on discharge and follow up with his PCP for further management.  I did instruct him to increase his Metformin to 1g BID if he can tolerate that dose, his sister reported that she did not have his medication at her house and so he was provided with a 1 month script of metformin and glipizide so that he may continue this until followup with his PCP.  Hyperkalemia >> Hypokalemia  - Initially hyperkalemic, given Ca gluconate and started on insulin drip. Patient with subsequent hypokalemia which was replaced and normalized.  - EKG repeated: no abnormalities   Alcohol abuse  - No S/S of acute withdraw, he was managed on our CIWA protocol (CIWA on discharge 0)  Metabolic acidosis with respiratory alkalosis and metabolic alkalosis  - Patient initially presented with an increased anion gap with an arterial pH of 7.42, pCO2 of 28 and bicarb of 18.  We suspected a combined acid base disorder with likely due to starvation ketosis verus mild lactic acidosis, respiratory alkalosis due to sedation from ativan and a metabolic alkalosis due to his vomiting and diarrhea.  His electrolytes were managed as above, he was hydrated and his hyperglycemic was treated.  His acid base disorder subsequently resolved with these interventions.  Hepatitis C - Not a current candidate for treatment given ongoing IV drug abuse. If patient gets in treatment he may be a candidate in the future.    Protein-calorie malnutrition, severe  - Patient was evaluated by our dietician, he was given nutritional supplements while inpatient.  I recommend he continue these on discharge.  ?Melena - Prior to discharge patient noted that he had dark tarry stools a few days prior to admission.  On Admission his Hgb was stable at 13 g/dL, he had no signs of hypovolemia and no further  evidence of dark or bloody stools while inpatient.  I instructed him that this will need to be followed up by his PCP as this could be a sign of bleeding in his GI tract but that it does not need to be worked up as an inpatient.  Discharge Vitals:   BP 110/87  Pulse 101  Temp(Src) 97.4 F (36.3 C) (Oral)  Resp 18  Ht 5\' 7"  (1.702 m)  Wt 118 lb 2.7 oz (53.6 kg)  BMI 18.50 kg/m2  SpO2 99%  Discharge Labs:  Results for orders placed during the hospital encounter of 12/04/13 (from the past 24 hour(s))  COMPREHENSIVE METABOLIC PANEL     Status: Abnormal   Collection Time    12/04/13  5:58 PM      Result Value Ref Range   Sodium 129 (*) 137 - 147 mEq/L   Potassium 5.5 (*) 3.7 - 5.3 mEq/L   Chloride 85 (*) 96 - 112 mEq/L   CO2 20  19 - 32 mEq/L   Glucose, Bld 607 (*) 70 - 99 mg/dL   BUN 16  6 - 23 mg/dL   Creatinine, Ser 4.090.97  0.50 - 1.35 mg/dL   Calcium 81.110.8 (*) 8.4 - 10.5 mg/dL   Total Protein 8.1  6.0 - 8.3 g/dL   Albumin 3.5  3.5 - 5.2 g/dL   AST 65 (*) 0 - 37 U/L   ALT 48  0 - 53 U/L   Alkaline Phosphatase 284 (*) 39 - 117 U/L   Total Bilirubin 1.4 (*) 0.3 - 1.2 mg/dL   GFR calc non Af Amer >90  >90 mL/min   GFR calc Af Amer >90  >90 mL/min  ETHANOL     Status: Abnormal   Collection Time    12/04/13  7:48 PM      Result Value Ref Range   Alcohol, Ethyl (B) 119 (*) 0 - 11 mg/dL  I-STAT ARTERIAL BLOOD GAS, ED     Status: Abnormal   Collection Time    12/04/13  8:02 PM      Result Value Ref Range   pH, Arterial 7.428  7.350 - 7.450   pCO2 arterial 28.4 (*) 35.0 - 45.0 mmHg   pO2, Arterial 99.0  80.0 - 100.0 mmHg   Bicarbonate 18.8 (*) 20.0 - 24.0 mEq/L   TCO2 20  0 - 100 mmol/L   O2 Saturation 98.0     Acid-base deficit 4.0 (*) 0.0 - 2.0 mmol/L   Patient temperature 98.5 F     Collection site RADIAL, ALLEN'S TEST ACCEPTABLE     Drawn by Operator     Sample type ARTERIAL    CBG MONITORING, ED     Status: Abnormal   Collection Time    12/04/13  9:33 PM      Result  Value Ref Range   Glucose-Capillary 532 (*) 70 - 99 mg/dL   Comment 1 Notify RN    CBC WITH DIFFERENTIAL     Status: Abnormal   Collection Time    12/04/13  9:40 PM      Result Value Ref Range   WBC 4.8  4.0 - 10.5 K/uL   RBC 4.82  4.22 - 5.81 MIL/uL   Hemoglobin 13.0  13.0 - 17.0 g/dL   HCT 91.437.2 (*) 78.239.0 - 95.652.0 %   MCV 77.2 (*) 78.0 - 100.0 fL   MCH 27.0  26.0 - 34.0 pg   MCHC 34.9  30.0 - 36.0 g/dL   RDW 21.312.8  08.611.5 - 57.815.5 %   Platelets 80 (*) 150 - 400 K/uL   Neutrophils Relative % 57  43 - 77 %   Neutro Abs 2.7  1.7 - 7.7 K/uL   Lymphocytes Relative 39  12 - 46 %   Lymphs Abs 1.9  0.7 - 4.0 K/uL   Monocytes Relative 3  3 - 12 %   Monocytes Absolute 0.2  0.1 - 1.0 K/uL   Eosinophils Relative 0  0 - 5 %   Eosinophils Absolute 0.0  0.0 - 0.7 K/uL   Basophils Relative 0  0 - 1 %   Basophils Absolute 0.0  0.0 - 0.1 K/uL  CBG MONITORING, ED     Status: Abnormal   Collection Time    12/04/13 11:53 PM      Result Value Ref Range   Glucose-Capillary 199 (*) 70 - 99 mg/dL  MRSA PCR SCREENING     Status: None   Collection Time  12/05/13 12:35 AM      Result Value Ref Range   MRSA by PCR NEGATIVE  NEGATIVE  GLUCOSE, CAPILLARY     Status: None   Collection Time    12/05/13  1:00 AM      Result Value Ref Range   Glucose-Capillary 85  70 - 99 mg/dL  GLUCOSE, CAPILLARY     Status: Abnormal   Collection Time    12/05/13  2:10 AM      Result Value Ref Range   Glucose-Capillary 139 (*) 70 - 99 mg/dL   Comment 1 Documented in Chart     Comment 2 Notify RN    BASIC METABOLIC PANEL     Status: Abnormal   Collection Time    12/05/13  2:45 AM      Result Value Ref Range   Sodium 134 (*) 137 - 147 mEq/L   Potassium 4.1  3.7 - 5.3 mEq/L   Chloride 96  96 - 112 mEq/L   CO2 23  19 - 32 mEq/L   Glucose, Bld 163 (*) 70 - 99 mg/dL   BUN 14  6 - 23 mg/dL   Creatinine, Ser 1.61  0.50 - 1.35 mg/dL   Calcium 9.1  8.4 - 09.6 mg/dL   GFR calc non Af Amer >90  >90 mL/min   GFR calc Af  Amer >90  >90 mL/min  HIV ANTIBODY (ROUTINE TESTING)     Status: None   Collection Time    12/05/13  2:45 AM      Result Value Ref Range   HIV 1&2 Ab, 4th Generation NONREACTIVE  NONREACTIVE  HEPATITIS PANEL, ACUTE     Status: Abnormal   Collection Time    12/05/13  2:45 AM      Result Value Ref Range   Hepatitis B Surface Ag NEGATIVE  NEGATIVE   HCV Ab Reactive (*) NEGATIVE   Hep A IgM NON REACTIVE  NON REACTIVE   Hep B C IgM NON REACTIVE  NON REACTIVE  LIPASE, BLOOD     Status: None   Collection Time    12/05/13  2:45 AM      Result Value Ref Range   Lipase 13  11 - 59 U/L  GLUCOSE, CAPILLARY     Status: Abnormal   Collection Time    12/05/13  3:15 AM      Result Value Ref Range   Glucose-Capillary 208 (*) 70 - 99 mg/dL   Comment 1 Documented in Chart     Comment 2 Notify RN    GLUCOSE, CAPILLARY     Status: Abnormal   Collection Time    12/05/13  4:20 AM      Result Value Ref Range   Glucose-Capillary 210 (*) 70 - 99 mg/dL   Comment 1 Documented in Chart     Comment 2 Notify RN    GLUCOSE, CAPILLARY     Status: Abnormal   Collection Time    12/05/13  5:32 AM      Result Value Ref Range   Glucose-Capillary 176 (*) 70 - 99 mg/dL   Comment 1 Documented in Chart     Comment 2 Notify RN    GLUCOSE, CAPILLARY     Status: Abnormal   Collection Time    12/05/13  6:40 AM      Result Value Ref Range   Glucose-Capillary 115 (*) 70 - 99 mg/dL   Comment 1 Documented in Chart     Comment  2 Notify RN    BASIC METABOLIC PANEL     Status: Abnormal   Collection Time    12/05/13  7:15 AM      Result Value Ref Range   Sodium 139  137 - 147 mEq/L   Potassium 3.6 (*) 3.7 - 5.3 mEq/L   Chloride 100  96 - 112 mEq/L   CO2 28  19 - 32 mEq/L   Glucose, Bld 75  70 - 99 mg/dL   BUN 14  6 - 23 mg/dL   Creatinine, Ser 1.610.82  0.50 - 1.35 mg/dL   Calcium 8.9  8.4 - 09.610.5 mg/dL   GFR calc non Af Amer >90  >90 mL/min   GFR calc Af Amer >90  >90 mL/min  URINE RAPID DRUG SCREEN (HOSP  PERFORMED)     Status: None   Collection Time    12/05/13  7:21 AM      Result Value Ref Range   Opiates NONE DETECTED  NONE DETECTED   Cocaine NONE DETECTED  NONE DETECTED   Benzodiazepines NONE DETECTED  NONE DETECTED   Amphetamines NONE DETECTED  NONE DETECTED   Tetrahydrocannabinol NONE DETECTED  NONE DETECTED   Barbiturates NONE DETECTED  NONE DETECTED  URINALYSIS, ROUTINE W REFLEX MICROSCOPIC     Status: Abnormal   Collection Time    12/05/13  7:21 AM      Result Value Ref Range   Color, Urine YELLOW  YELLOW   APPearance CLEAR  CLEAR   Specific Gravity, Urine 1.027  1.005 - 1.030   pH 6.0  5.0 - 8.0   Glucose, UA >1000 (*) NEGATIVE mg/dL   Hgb urine dipstick NEGATIVE  NEGATIVE   Bilirubin Urine NEGATIVE  NEGATIVE   Ketones, ur NEGATIVE  NEGATIVE mg/dL   Protein, ur NEGATIVE  NEGATIVE mg/dL   Urobilinogen, UA 0.2  0.0 - 1.0 mg/dL   Nitrite NEGATIVE  NEGATIVE   Leukocytes, UA NEGATIVE  NEGATIVE  URINE MICROSCOPIC-ADD ON     Status: Abnormal   Collection Time    12/05/13  7:21 AM      Result Value Ref Range   Squamous Epithelial / LPF RARE  RARE   WBC, UA 3-6  <3 WBC/hpf   Bacteria, UA RARE  RARE   Casts HYALINE CASTS (*) NEGATIVE  GLUCOSE, CAPILLARY     Status: Abnormal   Collection Time    12/05/13  7:44 AM      Result Value Ref Range   Glucose-Capillary 112 (*) 70 - 99 mg/dL   Comment 1 Notify RN    GLUCOSE, CAPILLARY     Status: Abnormal   Collection Time    12/05/13  7:56 AM      Result Value Ref Range   Glucose-Capillary 154 (*) 70 - 99 mg/dL  GLUCOSE, CAPILLARY     Status: Abnormal   Collection Time    12/05/13  8:47 AM      Result Value Ref Range   Glucose-Capillary 170 (*) 70 - 99 mg/dL  GLUCOSE, CAPILLARY     Status: Abnormal   Collection Time    12/05/13 12:32 PM      Result Value Ref Range   Glucose-Capillary 330 (*) 70 - 99 mg/dL   Comment 1 Notify RN      Signed: Carlynn PurlErik Hoffman, DO 12/05/2013, 4:26 PM   Time Spent on Discharge: 45  minutes Services Ordered on Discharge: none Equipment Ordered on Discharge: none

## 2013-12-05 NOTE — Progress Notes (Signed)
Attempt to call report nurse currently discharging a patient. Will return the call

## 2013-12-05 NOTE — Progress Notes (Signed)
Subjective: Patient feels much better today, he is AAOx4.  Denies any acute complaints but asks for methadone as "may have something coming on." Objective: Vital signs in last 24 hours: Filed Vitals:   12/05/13 0000 12/05/13 0030 12/05/13 0403 12/05/13 0825  BP: 134/93 134/93 132/86 115/73  Pulse: 106  89 84  Temp:  98.5 F (36.9 C) 98.5 F (36.9 C) 98.3 F (36.8 C)  TempSrc:  Oral Oral Axillary  Resp:   18 19  Height:  _0  (1.702 m)    Weight:  112 lb 14 oz (51.2 kg) 112 lb 14 oz (51.2 kg)   SpO2: 100% 100% 100% 100%   Weight change:   Intake/Output Summary (Last 24 hours) at 12/05/13 1041 Last data filed at 12/05/13 0700  Gross per 24 hour  Intake 744.23 ml  Output    400 ml  Net 344.23 ml   General: resting in bed, NAD HEENT: PERRL, EOMI, MMM Cardiac: RRR, no rubs, murmurs or gallops Pulm: CTAB Abd: soft, nontender, nondistended, BS present Ext: warm and well perfused, no pedal edema Neuro: alert and oriented X4 Lab Results: Basic Metabolic Panel:  Recent Labs Lab 12/05/13 0245 12/05/13 0715  NA 134* 139  K 4.1 3.6*  CL 96 100  CO2 23 28  GLUCOSE 163* 75  BUN 14 14  CREATININE 0.78 0.82  CALCIUM 9.1 8.9   Liver Function Tests:  Recent Labs Lab 12/04/13 1758  AST 65*  ALT 48  ALKPHOS 284*  BILITOT 1.4*  PROT 8.1  ALBUMIN 3.5    Recent Labs Lab 12/05/13 0245  LIPASE 13   No results found for this basename: AMMONIA,  in the last 168 hours CBC:  Recent Labs Lab 12/04/13 2140  WBC 4.8  NEUTROABS 2.7  HGB 13.0  HCT 37.2*  MCV 77.2*  PLT 80*   CBG:  Recent Labs Lab 12/05/13 0420 12/05/13 0532 12/05/13 0640 12/05/13 0744 12/05/13 0756 12/05/13 0847  GLUCAP 210* 176* 115* 112* 154* 170*   Urine Drug Screen: Drugs of Abuse     Component Value Date/Time   LABOPIA NONE DETECTED 12/05/2013 0721   COCAINSCRNUR NONE DETECTED 12/05/2013 0721   LABBENZ NONE DETECTED 12/05/2013 0721   AMPHETMU NONE DETECTED 12/05/2013 0721   THCU  NONE DETECTED 12/05/2013 0721   LABBARB NONE DETECTED 12/05/2013 0721    Alcohol Level:  Recent Labs Lab 12/04/13 1948  ETH 119*   Urinalysis:  Recent Labs Lab 12/05/13 0721  COLORURINE YELLOW  LABSPEC 1.027  PHURINE 6.0  GLUCOSEU >1000*  HGBUR NEGATIVE  BILIRUBINUR NEGATIVE  KETONESUR NEGATIVE  PROTEINUR NEGATIVE  UROBILINOGEN 0.2  NITRITE NEGATIVE  LEUKOCYTESUR NEGATIVE   Micro Results: Recent Results (from the past 240 hour(s))  MRSA PCR SCREENING     Status: None   Collection Time    12/05/13 12:35 AM      Result Value Ref Range Status   MRSA by PCR NEGATIVE  NEGATIVE Final   Comment:            The GeneXpert MRSA Assay (FDA     approved for NASAL specimens     only), is one component of a     comprehensive MRSA colonization     surveillance program. It is not     intended to diagnose MRSA     infection nor to guide or     monitor treatment for     MRSA infections.   Studies/Results: No results found. Medications: I  have reviewed the patient's current medications. Scheduled Meds: . enoxaparin (LOVENOX) injection  40 mg Subcutaneous Q24H  . folic acid  1 mg Oral Daily  . insulin aspart  0-9 Units Subcutaneous TID WC  . insulin glargine  10 Units Subcutaneous Daily  . multivitamin with minerals  1 tablet Oral Daily  . sodium chloride  3 mL Intravenous Q12H  . thiamine  100 mg Oral Daily   Continuous Infusions: . insulin (NOVOLIN-R) infusion Stopped (12/05/13 0801)   PRN Meds:.acetaminophen, acetaminophen, LORazepam, LORazepam, ondansetron (ZOFRAN) IV, ondansetron  EKG 6/16: NSR, Normal Axis, No ST or T wave changes, peaked T waves from previous tracing have resolved.  Assessment/Plan:   Heroin withdrawal/  Heroin abuse Appears he may be out of acute window of withdraw.  Patient has no acute symptoms.  He is requesting Methadone/Ativan for sleep.  Will hold off on these medications at this time.  His BP is low normal and would avoid clonidine at this  time. - Consult Social Work to explore treatment options, patient reports desire and family would like to help. - Monitor for further s/s of withdraw - Transfer to Medsurg    DM2 (diabetes mellitus, type 2) with Hyperglycemia - Glucose well controlled, no evidence of ketones, AG now closed - Transition off Insulin drip - Lantus 10 units -SSI- S - Carb mod diet    Hyperkalemia >> Hypokalemia - Initially hyperkalemic, given Ca gluconate and started on insulin drip.  Potassium this morning is now 3.6.  Will D/C drip and give  -76mq of oral Kdur - EKG repeated: no abnormalities    Alcohol abuse - No S/S of acute withdraw - CIWA protocol (Last CIWA 0)    Metabolic acidosis with respiratory alkalosis and metabolic alkalosis - AG this AM 11, Bicarb 28.  AG met acidosis appears to have resolved along with respiratory alkalosis.  May still have a component of Metabolic Alkalosis that is still resolving. - Repeat BMP in AM.  Dispo: Likely discharge tomorrow morning  The patient does have a current PCP (Chinwe NDerinda Late MD) and [facility].  The patient does not have transportation limitations that hinder transportation to clinic appointments.  .Services Needed at time of discharge: Y = Yes, Blank = No PT:   OT:   RN:   Equipment:   Other:     LOS: 1 day   EJoni Reining DO 12/05/2013, 10:41 AM

## 2013-12-05 NOTE — Progress Notes (Signed)
INITIAL NUTRITION ASSESSMENT  DOCUMENTATION CODES Per approved criteria  -Severe malnutrition in the context of chronic illness -Underweight   INTERVENTION: Glucerna Shake po BID, each supplement provides 220 kcal and 10 grams of protein RD to follow for nutrition care plan  NUTRITION DIAGNOSIS: Increased nutrient needs related to malnutrition, repletion as evidenced by estimated nutrition needs  Goal: Pt to meet >/= 90% of their estimated nutrition needs   Monitor:  PO & supplemental intake, weight, labs, I/O's  Reason for Assessment: Malnutrition Screening Tool Report  53 y.o. male  Admitting Dx: Heroin withdrawal  ASSESSMENT: 53 y.o. Male with heroin withdrawal, uncontrolled diabetes mellitus, excessive alcohol use, who presented with electrolyte abnormalities and mixed acid base disturbances.  Patient reports his appetite was poor for the past 4-5 days; skips meals 3-4 times per week likely due to drug use; reports a 12 lb weight loss during the past week; per wt readings below, pt has had a 12% weight loss since October 2014; + visible severe muscle loss to upper body; would like oral nutrition supplements; RD to order Glucerna Shake.  Nutrition Focused Physical Exam:   Subcutaneous Fat:  Orbital Region: WNL  Upper Arm Region: mild-moderate wasting  Thoracic and Lumbar Region: severe wasting   Muscle:  Temple Region: mild-moderate wasting  Clavicle Bone Region: severe wasting  Clavicle and Acromion Bone Region: mild-moderate wasting  Scapular Bone Region: severe wasting  Dorsal Hand: mild-moderate wasting  Patellar Region: WNL  Anterior Thigh Region: mild-moderate wasting  Posterior Calf Region: WNL  Edema: none  Patient continues to meet criteria for severe malnutrition in the context of chronic illness as evidenced by < 75% intake of estimated energy requirement for > 1 month and severe muscle loss.  Height: Ht Readings from Last 1 Encounters:  12/05/13 5'  7" (1.702 m)    Weight: Wt Readings from Last 1 Encounters:  12/05/13 112 lb 14 oz (51.2 kg)    Ideal Body Weight: 148 lb  % Ideal Body Weight: 75%  Wt Readings from Last 10 Encounters:  12/05/13 112 lb 14 oz (51.2 kg)  04/04/13 127 lb (57.607 kg)  03/21/13 148 lb 4.8 oz (67.268 kg)  03/21/13 148 lb 4.8 oz (67.268 kg)    Usual Body Weight: 127 lb  % Usual Body Weight: 88%  BMI:  Body mass index is 17.67 kg/(m^2).  Estimated Nutritional Needs: Kcal: 1800-2000 Protein: 90-100 gm Fluid: 1.8-2.0 L  Skin: Intact  Diet Order: Carb Control  EDUCATION NEEDS: -No education needs identified at this time   Intake/Output Summary (Last 24 hours) at 12/05/13 1232 Last data filed at 12/05/13 0700  Gross per 24 hour  Intake 744.23 ml  Output    400 ml  Net 344.23 ml    Labs:   Recent Labs Lab 12/04/13 1758 12/05/13 0245 12/05/13 0715  NA 129* 134* 139  K 5.5* 4.1 3.6*  CL 85* 96 100  CO2 20 23 28   BUN 16 14 14   CREATININE 0.97 0.78 0.82  CALCIUM 10.8* 9.1 8.9  GLUCOSE 607* 163* 75    CBG (last 3)   Recent Labs  12/05/13 0744 12/05/13 0756 12/05/13 0847  GLUCAP 112* 154* 170*    Scheduled Meds: . enoxaparin (LOVENOX) injection  40 mg Subcutaneous Q24H  . folic acid  1 mg Oral Daily  . insulin aspart  0-9 Units Subcutaneous TID WC  . insulin glargine  10 Units Subcutaneous Daily  . multivitamin with minerals  1 tablet Oral Daily  .  sodium chloride  3 mL Intravenous Q12H  . thiamine  100 mg Oral Daily    Continuous Infusions: . insulin (NOVOLIN-R) infusion Stopped (12/05/13 0801)    Past Medical History  Diagnosis Date  . Heroin abuse   . Diabetes mellitus without complication     Past Surgical History  Procedure Laterality Date  . I&d extremity Right 03/20/2013    Procedure: IRRIGATION AND DEBRIDEMENT EXTREMITY;  Surgeon: Marlowe ShoresMatthew A Weingold, MD;  Location: [facility] OR;  Service: Orthopedics;  Laterality: Right;    Maureen ChattersKatie Lamberton, RD,  LDN Pager #: [phone] After-Hours Pager #: [phone]

## 2013-12-06 DIAGNOSIS — R197 Diarrhea, unspecified: Secondary | ICD-10-CM

## 2013-12-06 DIAGNOSIS — B1920 Unspecified viral hepatitis C without hepatic coma: Secondary | ICD-10-CM

## 2013-12-06 DIAGNOSIS — R112 Nausea with vomiting, unspecified: Secondary | ICD-10-CM

## 2013-12-06 DIAGNOSIS — E43 Unspecified severe protein-calorie malnutrition: Secondary | ICD-10-CM

## 2013-12-06 LAB — BASIC METABOLIC PANEL
BUN: 11 mg/dL (ref 6–23)
CHLORIDE: 97 meq/L (ref 96–112)
CO2: 26 mEq/L (ref 19–32)
Calcium: 8.7 mg/dL (ref 8.4–10.5)
Creatinine, Ser: 0.91 mg/dL (ref 0.50–1.35)
GFR calc non Af Amer: >90 mL/min (ref >90)
Glucose, Bld: 226 mg/dL — ABNORMAL HIGH (ref 70–99)
Potassium: 4 mEq/L (ref 3.7–5.3)
Sodium: 134 mEq/L — ABNORMAL LOW (ref 137–147)

## 2013-12-06 LAB — GLUCOSE, CAPILLARY
GLUCOSE-CAPILLARY: 247 mg/dL — AB (ref 70–99)
GLUCOSE-CAPILLARY: 344 mg/dL — AB (ref 70–99)
GLUCOSE-CAPILLARY: 113 mg/dL — AB (ref 70–99)

## 2013-12-06 MED ORDER — INSULIN ASPART 100 UNIT/ML ~~LOC~~ SOLN
2.0000 [IU] | Freq: Three times a day (TID) | SUBCUTANEOUS | Status: DC
  Administered 2013-12-06 (×2): 2 [IU] via SUBCUTANEOUS

## 2013-12-06 MED ORDER — METFORMIN HCL 1000 MG PO TABS: 1000.0000 mg | ORAL_TABLET | Freq: Two times a day (BID) | ORAL | Status: DC

## 2013-12-06 MED ORDER — GLUCERNA SHAKE PO LIQD: 237.0000 mL | Freq: Two times a day (BID) | ORAL | Status: DC

## 2013-12-06 MED ORDER — GLIPIZIDE 5 MG PO TABS: 5.0000 mg | ORAL_TABLET | Freq: Two times a day (BID) | ORAL | Status: DC

## 2013-12-06 NOTE — Discharge Instructions (Signed)
Increase Metformin to 1000mg  twice a day (twice your previous dose) and continue to take Glipizide 5mg  twice a day.  Please check your blood sugars at least twice a day.  Please call the [facility] at [phone] extension [phone] to try into a drug treatment program through the [facility].  You can also show up at the [facility] to get into treatment to do this please go to the [facility] on Tuesday or Thursdays between the hours of 8:30am to 11am go to the 2nd floor waiting room of Building [address]. They will provide you additional instructions from there.  You can also change your [facility] Care provider [facility] to [facility] by calling Angelena FormKeith Gaston (supervisor) at [phone] ext [phone] to schedule an appointment with a new primary care physician that would be closer. I would recommend that you call and try to schedule an appointment as soon as possible for hospital follow up.  Continue your medications as listed on this paper.  Please [facility] up with your [facility] Care [facility] as soon as possible (call and see if you can get an earlier appointment).    Finding Treatment for Alcohol and Drug Addiction It can be hard to find the right place to get professional treatment. Here are some important things to consider:  There are different types of treatment to choose from.  Some programs are live-in (residential) while others are not (outpatient). Sometimes a combination is offered.  No single type of program is right for everyone.  Most treatment programs involve a combination of education, counseling, and a 12-step, spiritually-based approach.  There are non-spiritually based programs (not 12-step).  Some treatment programs are government sponsored. They are geared for patients without private insurance.  Treatment programs can vary in many respects such as:  Cost and types of insurance accepted.  Types of on-site medical services  offered.  Length of stay, setting, and size.  Overall philosophy of treatment. A person may need specialized treatment or have needs not addressed by all programs. For example, adolescents need treatment appropriate for their age. Other people have secondary disorders that must be managed as well. Secondary conditions can include mental illness, such as depression or diabetes. Often, a period of detoxification from alcohol or drugs is needed. This requires medical supervision and not all programs offer this. THINGS TO CONSIDER WHEN SELECTING A TREATMENT PROGRAM   Is the program certified by the appropriate government agency? Even private programs must be certified and employ certified professionals.  Does the program accept your insurance? If not, can a payment plan be set up?  Is the facility clean, organized, and well run? Do they allow you to speak with graduates who can share their treatment experience with you? Can you tour the facility? Can you meet with staff?  Does the program meet the full range of individual needs?  Does the treatment program address sexual orientation and physical disabilities? Do they provide age, gender, and culturally appropriate treatment services?  Is treatment available in languages other than English?  Is long-term aftercare support or guidance encouraged and provided?  Is assessment of an individual's treatment plan ongoing to ensure it meets changing needs?  Does the program use strategies to encourage reluctant patients to remain in treatment long enough to increase the likelihood of success?  Does the program offer counseling (individual or group) and other behavioral therapies?  Does the program offer medicine as part of the  treatment regimen, if needed?  Is there ongoing monitoring of possible relapse? Is there a defined relapse prevention program? Are services or referrals offered to family members to ensure they understand addiction and the  recovery process? This would help them support the recovering individual.  Are 12-step meetings held at the center or is transport available for patients to attend outside meetings? In countries outside of the [place], Magazine features editorsee local directories for contact information for services in your area. Document Released: 05/07/2005 Document Revised: 08/31/2011 Document Reviewed: 11/17/2007 [facility] Patient Information 2015 [employer] [place]. This information is not intended to replace advice given to you by your health care provider. Make sure you discuss any questions you have with your health care provider.

## 2013-12-06 NOTE — Progress Notes (Signed)
I saw the patient today in the morning rounds with my inpatient IM team. I discussed the care plan with the team as well. I have reviewed Dr Neita GarnetHoffman's note from today and I agree with his documentation. Please see his note for details.

## 2013-12-06 NOTE — Progress Notes (Signed)
Nsg Discharge Note  Admit Date:  12/04/2013 Discharge date: 12/06/2013   Raymond Winters to be D/C'd Home per MD order.  AVS completed.  Copy for chart, and copy for patient signed, and dated. Patient/caregiver able to verbalize understanding.  Discharge Medication:   Medication List         feeding supplement (GLUCERNA SHAKE) Liqd  Take 237 mLs by mouth 2 (two) times daily between meals.     glipiZIDE 5 MG tablet  Commonly known as:  GLUCOTROL  Take 1 tablet (5 mg total) by mouth 2 (two) times daily before a meal.     metFORMIN 1000 MG tablet  Commonly known as:  GLUCOPHAGE  Take 1 tablet (1,000 mg total) by mouth 2 (two) times daily with a meal.     multivitamin with minerals Tabs tablet  Take 1 tablet by mouth daily.        Discharge Assessment: Filed Vitals:   12/06/13 1206  BP: 120/83  Pulse: 84  Temp: 98.5 F (36.9 C)  Resp: 18   Skin clean, dry and intact without evidence of skin break down, no evidence of skin tears noted. IV catheter discontinued intact. Site without signs and symptoms of complications - no redness or edema noted at insertion site, patient denies c/o pain - only slight tenderness at site.  Dressing with slight pressure applied.  D/c Instructions-Education: Discharge instructions given to patient/family with verbalized understanding. D/c education completed with patient/family including follow up instructions, medication list, d/c activities limitations if indicated, with other d/c instructions as indicated by MD - patient able to verbalize understanding, all questions fully answered. Patient instructed to return to ED, call 911, or call MD for any changes in condition.  Patient to go home when sister arrives to pick patient up.   Kern ReapBrumagin, [name], RN 12/06/2013 7:27 PM

## 2013-12-06 NOTE — Progress Notes (Signed)
Subjective: Patient feeling well, no complaints.  Today he does report that for a few days (about 3) prior to admission he was having some dark tarry stools but that seems to have resolved here in the hospital.  He has not noticed any blood in his stool. Objective: Vital signs in last 24 hours: Filed Vitals:   12/05/13 1607 12/05/13 2300 12/05/13 2351 12/06/13 0605  BP: 110/87 127/82 124/84 120/82  Pulse: 101 83 85 94  Temp: 97.4 F (36.3 C)  98.8 F (37.1 C) 98.7 F (37.1 C)  TempSrc: Oral  Oral Oral  Resp: 18  18 18   Height: 5\' 7"  (1.702 m)     Weight: 118 lb 2.7 oz (53.6 kg)     SpO2: 99%  100% 100%   Weight change: 5 lb 4.7 oz (2.4 kg)  Intake/Output Summary (Last 24 hours) at 12/06/13 0843 Last data filed at 12/06/13 [id number]  Gross per 24 hour  Intake    240 ml  Output    650 ml  Net   -410 ml   General: resting in bed, NAD HEENT: MMM Cardiac: RRR, no rubs, murmurs or gallops Pulm: CTAB Abd: soft, nontender, nondistended, +BS  Ext: warm and well perfused, no pedal edema Neuro: alert and oriented X4, normal gait, negative Romberg. Not tremulous, current CIWA 0. Lab Results: Basic Metabolic Panel:  Recent Labs Lab 12/05/13 0715 12/06/13 0500  NA 139 134*  K 3.6* 4.0  CL 100 97  CO2 28 26  GLUCOSE 75 226*  BUN 14 11  CREATININE 0.82 0.91  CALCIUM 8.9 8.7   Liver Function Tests:  Recent Labs Lab 12/04/13 1758  AST 65*  ALT 48  ALKPHOS 284*  BILITOT 1.4*  PROT 8.1  ALBUMIN 3.5    Recent Labs Lab 12/05/13 0245  LIPASE 13   No results found for this basename: AMMONIA,  in the last 168 hours CBC:  Recent Labs Lab 12/04/13 2140  WBC 4.8  NEUTROABS 2.7  HGB 13.0  HCT 37.2*  MCV 77.2*  PLT 80*   CBG:  Recent Labs Lab 12/05/13 0756 12/05/13 0847 12/05/13 1232 12/05/13 1645 12/05/13 2103 12/06/13 0826  GLUCAP 154* 170* 330* 162* 283* 247*   Urine Drug Screen: Drugs of Abuse     Component Value Date/Time   LABOPIA NONE DETECTED  12/05/2013 0721   COCAINSCRNUR NONE DETECTED 12/05/2013 0721   LABBENZ NONE DETECTED 12/05/2013 0721   AMPHETMU NONE DETECTED 12/05/2013 0721   THCU NONE DETECTED 12/05/2013 0721   LABBARB NONE DETECTED 12/05/2013 0721    Alcohol Level:  Recent Labs Lab 12/04/13 1948  ETH 119*   Urinalysis:  Recent Labs Lab 12/05/13 0721  COLORURINE YELLOW  LABSPEC 1.027  PHURINE 6.0  GLUCOSEU >1000*  HGBUR NEGATIVE  BILIRUBINUR NEGATIVE  KETONESUR NEGATIVE  PROTEINUR NEGATIVE  UROBILINOGEN 0.2  NITRITE NEGATIVE  LEUKOCYTESUR NEGATIVE   Micro Results: Recent Results (from the past 240 hour(s))  MRSA PCR SCREENING     Status: None   Collection Time    12/05/13 12:35 AM      Result Value Ref Range Status   MRSA by PCR NEGATIVE  NEGATIVE Final   Comment:            The GeneXpert MRSA Assay (FDA     approved for NASAL specimens     only), is one component of a     comprehensive MRSA colonization     surveillance program. It is not  intended to diagnose MRSA     infection nor to guide or     monitor treatment for     MRSA infections.   Studies/Results: No results found. Medications: I have reviewed the patient's current medications. Scheduled Meds: . enoxaparin (LOVENOX) injection  40 mg Subcutaneous Q24H  . feeding supplement (GLUCERNA SHAKE)  237 mL Oral BID BM  . folic acid  1 mg Oral Daily  . insulin aspart  0-5 Units Subcutaneous QHS  . insulin aspart  0-9 Units Subcutaneous TID WC  . insulin glargine  10 Units Subcutaneous Daily  . multivitamin with minerals  1 tablet Oral Daily  . sodium chloride  3 mL Intravenous Q12H  . thiamine  100 mg Oral Daily   Continuous Infusions:   PRN Meds:.acetaminophen, acetaminophen, LORazepam, LORazepam, ondansetron (ZOFRAN) IV, ondansetron  Assessment/Plan:   Heroin withdrawal/  Heroin abuse Appears he may be out of acute window of withdraw.  Patient has no acute symptoms. - Consult social work for possible placement into inpatient  rehab (patient will and sisters encouraging) - Otherwise stable for discharge. - I have spoken with his sister Britta MccreedyBarbara (cell [phone]) she reports that Mr. Beulah GandyMix girlfriend has kicked him out, she is willing to take him home with her (can be by later today to get him) I have given her the information about the [facility] programs (Tel [phone] ext [phone]) and she reports she will call and try to get him in with them.  I left her know he [facility] (he notes he has an appointment in July), I asked that she try to get a sooner appointment.    DM2 (diabetes mellitus, type 2) with Hyperglycemia - Glucose better controlled but still hyperglycemic - Lantus 10 units - Novolig 2 units TID WC + SSI + HS correction - Will resume oral hypoglycemic meds on discharge    Hyperkalemia >> Hypokalemia - resolved - Initially hyperkalemic, then hypokalemic on insulin drip.  Potassium has been replaced and patient K+ is now 4.    Alcohol abuse - No S/S of acute withdraw - CIWA protocol (Last CIWA 4), Highest 9 last night> given 1mg  ativan. - Current CIWA is 0.    Metabolic acidosis with respiratory alkalosis and metabolic alkalosis - resolved.  Melena - Patient reports history of melena prior to admission, Hgb on 6/15 stable at 13 g/dL. - No tachycardia, no further evidence of bleeding, can be worked up outpatient but will try to obtain FOBT while here.  Dispo: Discharge today home with sister.  The patient does have a current PCP (Chinwe Micael [place], MD) and does not need an [facility] after discharge.  The patient does not have transportation limitations that hinder transportation to clinic appointments.  .Services Needed at time of discharge: Y = Yes, Blank = No PT:   OT:   RN:   Equipment:   Other:     LOS: 2 days   Carlynn PurlErik Hoffman, DO 12/06/2013, 8:43 AM

## 2013-12-06 NOTE — Progress Notes (Signed)
Inpatient Diabetes Program Recommendations  AACE/ADA: New Consensus Statement on Inpatient Glycemic Control (2013)  Target Ranges:  Prepandial:   less than 140 mg/dL      Peak postprandial:   less than 180 mg/dL (1-2 hours)      Critically ill patients:  140 - 180 mg/dL   Reason for Visit: Hyperglycemia Diabetes history: DM2 Outpatient Diabetes medications: metformin 500 mg bid, glipizide 5 mg bid Current orders for Inpatient glycemic control: Lantus 10 units QAM, Novolog sensitive tidwc and hs  Results for Larinda ButteryMIX, Osamu (MRN [id number]) as of 12/06/2013 11:39  Ref. Range 12/05/2013 12:32 12/05/2013 16:45 12/05/2013 21:03 12/06/2013 08:26  Glucose-Capillary Latest Range: 70-99 mg/dL 409330 (H) 811162 (H) 914283 (H) 247 (H)     Inpatient Diabetes Program Recommendations HgbA1C: Need updated HgbA1C to access glycemic control prior to hospitalization  Note: Will follow while inpatient. Thank you. Ailene Ardshonda Vaughan, RD, LDN, CDE Inpatient Diabetes Coordinator [phone]

## 2013-12-07 NOTE — Discharge Summary (Signed)
Reviewed note. Agree with documentation by Dr Mikey BussingHoffman.

## 2013-12-11 LAB — HCV RNA QUANT RFLX ULTRA OR GENOTYP: HCV Quantitative: >100000000 IU/mL — ABNORMAL HIGH (ref <15)

## 2013-12-12 ENCOUNTER — Emergency Department (HOSPITAL_COMMUNITY): Payer: Non-veteran care

## 2013-12-12 ENCOUNTER — Encounter (HOSPITAL_COMMUNITY): Payer: Self-pay | Admitting: Emergency Medicine

## 2013-12-12 ENCOUNTER — Emergency Department (HOSPITAL_COMMUNITY)
Admission: EM | Admit: 2013-12-12 | Discharge: 2013-12-12 | Disposition: A | Discharge status: Home / Self Care | Payer: Non-veteran care | Attending: Emergency Medicine | Admitting: Emergency Medicine

## 2013-12-12 DIAGNOSIS — Z79899 Other long term (current) drug therapy: Secondary | ICD-10-CM | POA: Insufficient documentation

## 2013-12-12 DIAGNOSIS — E119 Type 2 diabetes mellitus without complications: Secondary | ICD-10-CM | POA: Insufficient documentation

## 2013-12-12 DIAGNOSIS — R079 Chest pain, unspecified: Secondary | ICD-10-CM

## 2013-12-12 DIAGNOSIS — R0602 Shortness of breath: Secondary | ICD-10-CM | POA: Insufficient documentation

## 2013-12-12 DIAGNOSIS — Z7982 Long term (current) use of aspirin: Secondary | ICD-10-CM | POA: Insufficient documentation

## 2013-12-12 DIAGNOSIS — R000 Tachycardia, unspecified: Secondary | ICD-10-CM | POA: Insufficient documentation

## 2013-12-12 DIAGNOSIS — R209 Unspecified disturbances of skin sensation: Secondary | ICD-10-CM | POA: Insufficient documentation

## 2013-12-12 DIAGNOSIS — F17200 Nicotine dependence, unspecified, uncomplicated: Secondary | ICD-10-CM | POA: Insufficient documentation

## 2013-12-12 LAB — BASIC METABOLIC PANEL
BUN: 15 mg/dL (ref 6–23)
CALCIUM: 9.1 mg/dL (ref 8.4–10.5)
CO2: 23 mEq/L (ref 19–32)
Chloride: 105 mEq/L (ref 96–112)
Creatinine, Ser: 0.91 mg/dL (ref 0.50–1.35)
GLUCOSE: 49 mg/dL — AB (ref 70–99)
Potassium: 4.4 mEq/L (ref 3.7–5.3)
SODIUM: 144 meq/L (ref 137–147)

## 2013-12-12 LAB — I-STAT TROPONIN, ED
TROPONIN I, POC: 0 ng/mL (ref 0.00–0.08)
Troponin i, poc: 0 ng/mL (ref 0.00–0.08)

## 2013-12-12 LAB — D-DIMER, QUANTITATIVE: D-Dimer, Quant: 0.42 ug/mL-FEU (ref 0.00–0.48)

## 2013-12-12 LAB — CBC
HCT: 29.3 % — ABNORMAL LOW (ref 39.0–52.0)
Hemoglobin: 9.9 g/dL — ABNORMAL LOW (ref 13.0–17.0)
MCH: 27.1 pg (ref 26.0–34.0)
MCHC: 33.8 g/dL (ref 30.0–36.0)
MCV: 80.3 fL (ref 78.0–100.0)
PLATELETS: 189 10*3/uL (ref 150–400)
RBC: 3.65 MIL/uL — ABNORMAL LOW (ref 4.22–5.81)
RDW: 13.6 % (ref 11.5–15.5)
WBC: 6.4 10*3/uL (ref 4.0–10.5)

## 2013-12-12 LAB — CBG MONITORING, ED
Glucose-Capillary: 56 mg/dL — ABNORMAL LOW (ref 70–99)
Glucose-Capillary: 80 mg/dL (ref 70–99)

## 2013-12-12 NOTE — ED Notes (Signed)
Pt states he had a sudden onset of CP tonight that is rated a 3 at current.  Pt states he had a period of numbness in his right hand earlier today that resolved on its own.  Pt took one 81mg  ASA at home and was given another 243 ASA by EMS

## 2013-12-12 NOTE — ED Provider Notes (Signed)
CSN: [id number]     Arrival date & time 12/12/13  0124 History   First MD Initiated Contact with Patient 12/12/13 0125     Chief Complaint  Patient presents with  . Chest Pain     (Consider location/radiation/quality/duration/timing/severity/associated sxs/prior Treatment) Patient is a 53 y.o. male presenting with chest pain. The history is provided by the patient.  Chest Pain Pain location:  L chest Pain quality: sharp   Pain radiates to:  Does not radiate Pain radiates to the back: no   Pain severity:  Moderate Onset quality:  Sudden Duration:  45 minutes Timing:  Constant Progression:  Resolved Chronicity:  New Context: at rest   Relieved by:  Nothing Worsened by:  Nothing tried Associated symptoms: shortness of breath   Associated symptoms: no abdominal pain, no cough, no fever and not vomiting     Past Medical History  Diagnosis Date  . Heroin abuse   . Diabetes mellitus without complication    Past Surgical History  Procedure Laterality Date  . I&d extremity Right 03/20/2013    Procedure: IRRIGATION AND DEBRIDEMENT EXTREMITY;  Surgeon: Marlowe ShoresMatthew A Weingold, MD;  Location: [facility] OR;  Service: Orthopedics;  Laterality: Right;   No family history on file. History  Substance Use Topics  . Smoking status: Current Every Day Smoker -- 1.10 packs/day for 11 years  . Smokeless tobacco: Never Used     Comment: encouraged to quit  . Alcohol Use: Yes    Review of Systems  Constitutional: Negative for fever.  Respiratory: Positive for shortness of breath. Negative for cough.   Cardiovascular: Positive for chest pain.  Gastrointestinal: Negative for vomiting and abdominal pain.  All other systems reviewed and are negative.     Allergies  Review of patient's allergies indicates no known allergies.  Home Medications   Prior to Admission medications   Medication Sig Start Date End Date Taking? Authorizing Provider  aspirin 81 MG chewable tablet Chew 81 mg by mouth  once.   Yes Historical Provider, MD  feeding supplement, GLUCERNA SHAKE, (GLUCERNA SHAKE) LIQD Take 237 mLs by mouth 2 (two) times daily between meals. 12/06/13  Yes Carlynn PurlErik Hoffman, DO  glipiZIDE (GLUCOTROL) 5 MG tablet Take 1 tablet (5 mg total) by mouth 2 (two) times daily before a meal. 12/06/13  Yes Carlynn PurlErik Hoffman, DO  metFORMIN (GLUCOPHAGE) 1000 MG tablet Take 1 tablet (1,000 mg total) by mouth 2 (two) times daily with a meal. 12/06/13  Yes Carlynn PurlErik Hoffman, DO  Multiple Vitamin (MULTIVITAMIN WITH MINERALS) TABS tablet Take 1 tablet by mouth daily.   Yes Historical Provider, MD   BP 130/82  Pulse 107  Temp(Src) 98.1 F (36.7 C) (Oral)  Resp 15  Ht 5\' 7"  (1.702 m)  Wt 120 lb (54.432 kg)  BMI 18.79 kg/m2  SpO2 96% Physical Exam  Nursing note and vitals reviewed. Constitutional: He is oriented to person, place, and time. He appears well-developed and well-nourished. No distress.  HENT:  Head: Normocephalic and atraumatic.  Mouth/Throat: No oropharyngeal exudate.  Eyes: EOM are normal. Pupils are equal, round, and reactive to light.  Neck: Normal range of motion. Neck supple.  Cardiovascular: Regular rhythm.  Tachycardia present.  Exam reveals no friction rub.   No murmur heard. Pulmonary/Chest: Breath sounds normal. Not tachypneic. No respiratory distress. He has no wheezes. He has no rales.  Abdominal: He exhibits no distension. There is no tenderness. There is no rebound.  Musculoskeletal: Normal range of motion. He exhibits no edema.  Neurological: He is alert and oriented to person, place, and time.  Skin: He is not diaphoretic.    ED Course  Procedures (including critical care time) Labs Review Labs Reviewed  CBC - Abnormal; Notable for the following:    RBC 3.65 (*)    Hemoglobin 9.9 (*)    HCT 29.3 (*)    All other components within normal limits  BASIC METABOLIC PANEL  D-DIMER, QUANTITATIVE  Rosezena SensorI-STAT TROPOININ, ED    Imaging Review Dg Chest Port 1 View  12/12/2013    CLINICAL DATA:  Chest pain  EXAM: PORTABLE CHEST - 1 VIEW  COMPARISON:  04/11/2007  FINDINGS: The heart size and mediastinal contours are within normal limits. Both lungs are clear. The visualized skeletal structures are unremarkable.  IMPRESSION: No active disease.   Electronically Signed   By: Burman NievesWilliam  Stevens M.D.   On: 12/12/2013 01:52     EKG Interpretation   Date/Time:  Tuesday December 12 2013 01:31:24 EDT Ventricular Rate:  110 PR Interval:  147 QRS Duration: 83 QT Interval:  345 QTC Calculation: 467 R Axis:   69 Text Interpretation:  Sinus tachycardia Similar to prior Confirmed by  Gwendolyn GrantWALDEN  MD, BLAIR ([id number]) on 12/12/2013 2:19:27 AM      MDM   Final diagnoses:  None    53 year old male with history of diabetes and heroin use present or chest pain. Had 45 minute episode of sharp chest pain with right hand numbness. He thought it was related to his hyperglycemia, because he was recently admitted for hyperglycemia about one week ago. No radiation of his chest pain. Resolved after aspirin. No history of chest pain. No history of CAD. Here vitals are stable, mild tachycardia here. No history of blood clots. Exam benign. EKG with sinus tach, no ischemic changes. Will check labs. Dimer ok, serial troponins ok. Stable for discharge.  Dagmar HaitWilliam Blair Walden, MD 12/12/13 [phone]

## 2013-12-12 NOTE — Discharge Instructions (Signed)

## 2013-12-27 ENCOUNTER — Ambulatory Visit: Payer: Self-pay | Attending: Internal Medicine | Admitting: Internal Medicine

## 2013-12-27 ENCOUNTER — Encounter: Payer: Self-pay | Admitting: Internal Medicine

## 2013-12-27 VITALS — BP 89/59 | HR 106 | Temp 98.4°F | Resp 20 | Ht 67.0 in | Wt 121.2 lb

## 2013-12-27 DIAGNOSIS — Z7689 Persons encountering health services in other specified circumstances: Secondary | ICD-10-CM

## 2013-12-27 DIAGNOSIS — Z0000 Encounter for general adult medical examination without abnormal findings: Secondary | ICD-10-CM | POA: Insufficient documentation

## 2013-12-27 DIAGNOSIS — F1910 Other psychoactive substance abuse, uncomplicated: Secondary | ICD-10-CM | POA: Insufficient documentation

## 2013-12-27 DIAGNOSIS — E1165 Type 2 diabetes mellitus with hyperglycemia: Secondary | ICD-10-CM

## 2013-12-27 DIAGNOSIS — E785 Hyperlipidemia, unspecified: Secondary | ICD-10-CM | POA: Insufficient documentation

## 2013-12-27 DIAGNOSIS — Z7189 Other specified counseling: Secondary | ICD-10-CM

## 2013-12-27 DIAGNOSIS — E119 Type 2 diabetes mellitus without complications: Secondary | ICD-10-CM | POA: Insufficient documentation

## 2013-12-27 DIAGNOSIS — F17200 Nicotine dependence, unspecified, uncomplicated: Secondary | ICD-10-CM | POA: Insufficient documentation

## 2013-12-27 LAB — GLUCOSE, POCT (MANUAL RESULT ENTRY): POC Glucose: 197 mg/dl — AB (ref 70–99)

## 2013-12-27 LAB — POCT URINALYSIS DIPSTICK
BILIRUBIN UA: NEGATIVE
Blood, UA: NEGATIVE
Glucose, UA: 250
KETONES UA: NEGATIVE
Leukocytes, UA: NEGATIVE
Nitrite, UA: NEGATIVE
PH UA: 6
Protein, UA: NEGATIVE
SPEC GRAV UA: 1.02
Urobilinogen, UA: 0.2

## 2013-12-27 LAB — POCT GLYCOSYLATED HEMOGLOBIN (HGB A1C): HEMOGLOBIN A1C: 7.9

## 2013-12-27 MED ORDER — GLUCOSE BLOOD VI STRP: ORAL_STRIP | Status: DC

## 2013-12-27 MED ORDER — FREESTYLE SYSTEM KIT: 1.0000 | PACK | Status: DC | PRN

## 2013-12-27 MED ORDER — FREESTYLE LANCETS MISC: Status: DC

## 2013-12-27 NOTE — Progress Notes (Signed)
Patient presents to establish care for DM, HTN and hyperlipidemia. C/O feeling weak for 2 weeks. States, "I can't get my strength" States recent hospitalization for CBG in [address]s Sister present Would like to quit smoking

## 2013-12-27 NOTE — Progress Notes (Signed)
Patient ID: Raymond Winters, male   DOB: 05/17/1961, 53 y.o.   MRN: [id number]  CWC:[id number]  [id number]  DOB - 27-Feb-1961  CC:  Chief Complaint  Patient presents with  . Establish Care  . Diabetes  . Hypertension  . Hyperlipidemia       HPI: Raymond Winters is a 53 y.o. male here today to establish medical care.  Patient has a history of DM, HTN, HLD, and heroin abuse.  Patient reports lightheaded, dizzy, weight loss, and syncopal episode twice.  Last syncopal episode was last week. Reports that he has been taking his Metformin and Glipizide for a number of years now.  Patient reports that Metformin has been giving him diarrhea for the past 2 months, but he has only been taking 500 mg BID.   Patient reports that he has had some CBG readings that were 600. Patient reports that he has not used heroin in 2 weeks.  Patient reports that he is not ready to enter a substance abuse program because he has something more important to take care of in the next two weeks.   .  No Known Allergies Past Medical History  Diagnosis Date  . Heroin abuse   . Diabetes mellitus without complication   . Hyperlipidemia   . Hypertension    Current Outpatient Prescriptions on File Prior to Visit  Medication Sig Dispense Refill  . aspirin 81 MG chewable tablet Chew 81 mg by mouth once.      Marland Kitchen glipiZIDE (GLUCOTROL) 5 MG tablet Take 1 tablet (5 mg total) by mouth 2 (two) times daily before a meal.  60 tablet  0  . metFORMIN (GLUCOPHAGE) 1000 MG tablet Take 1 tablet (1,000 mg total) by mouth 2 (two) times daily with a meal.  60 tablet  0  . Multiple Vitamin (MULTIVITAMIN WITH MINERALS) TABS tablet Take 1 tablet by mouth daily.      . feeding supplement, GLUCERNA SHAKE, (GLUCERNA SHAKE) LIQD Take 237 mLs by mouth 2 (two) times daily between meals.    0   No current facility-administered medications on file prior to visit.   Family History  Problem Relation Age of Onset  . Diabetes Mother   . Heart disease Mother   .  Hypertension Mother   . Stroke Mother   . Vision loss Mother   . Heart disease Father   . Hypertension Father    History   Social History  . Marital Status: Single    Spouse Name: N/A    Number of Children: N/A  . Years of Education: N/A   Occupational History  . Not on file.   Social History Main Topics  . Smoking status: Current Every Day Smoker -- 1.10 packs/day for 11 years  . Smokeless tobacco: Never Used     Comment: encouraged to quit  . Alcohol Use: Yes  . Drug Use: Yes    Special: IV     Comment: heroin  . Sexual Activity: Not on file   Other Topics Concern  . Not on file   Social History Narrative  . No narrative on file    Review of Systems: Constitutional: Negative for fever, chills, diaphoresis, activity change, appetite change and fatigue. HENT: Negative for ear pain, nosebleeds, congestion, facial swelling, rhinorrhea, neck pain, neck stiffness and ear discharge.  Eyes: Negative for pain, discharge, redness, itching and visual disturbance. Respiratory: Negative for cough, choking, chest tightness, shortness of breath, wheezing and stridor.  Cardiovascular: Negative for chest pain, palpitations  and leg swelling. Gastrointestinal: Negative for abdominal distention. Genitourinary: Negative for dysuria, urgency, frequency, hematuria, flank pain, decreased urine volume, difficulty urinating and dyspareunia.  Musculoskeletal: Negative for back pain, joint swelling, arthralgia and gait problem. Neurological: Negative for dizziness, tremors, seizures, syncope, facial asymmetry, speech difficulty, weakness, light-headedness, numbness and headaches.  Hematological: Negative for adenopathy. Does not bruise/bleed easily. Psychiatric/Behavioral: Negative for hallucinations, behavioral problems, confusion, dysphoric mood, decreased concentration and agitation.    Objective:   Filed Vitals:   12/27/13 1415  BP: 89/59  Pulse: 106  Temp: 98.4 F (36.9 C)  Resp:  20    Physical Exam: Constitutional: Patient appears well-developed and well-nourished. No distress. HENT: Normocephalic, atraumatic, External right and left ear normal. Oropharynx is clear and moist.  Eyes: Conjunctivae and EOM are normal. PERRLA, no scleral icterus. Neck: Normal ROM. Neck supple. No JVD. No tracheal deviation. No thyromegaly. CVS: RRR, S1/S2 +, no murmurs, no gallops, no carotid bruit.  Pulmonary: Effort and breath sounds normal, no stridor, rhonchi, wheezes, rales.  Abdominal: Soft. BS +, no distension, tenderness, rebound or guarding.  Musculoskeletal: Normal range of motion. No edema and no tenderness.  Lymphadenopathy: No lymphadenopathy noted, cervical, inguinal or axillary Neuro: Alert. Normal reflexes, muscle tone coordination. No cranial nerve deficit. Skin: Skin is warm and dry. No rash noted. Not diaphoretic. No erythema. No pallor. Psychiatric: Normal mood and affect. Behavior, judgment, thought content normal.  Lab Results  Component Value Date   WBC 6.4 12/12/2013   HGB 9.9* 12/12/2013   HCT 29.3* 12/12/2013   MCV 80.3 12/12/2013   PLT 189 12/12/2013   Lab Results  Component Value Date   CREATININE 0.91 12/12/2013   BUN 15 12/12/2013   NA 144 12/12/2013   K 4.4 12/12/2013   CL 105 12/12/2013   CO2 23 12/12/2013    Lab Results  Component Value Date   HGBA1C 7.9 12/27/2013   Lipid Panel  No results found for this basename: chol, trig, hdl, cholhdl, vldl, ldlcalc       Assessment and plan:   Raymond Winters was seen today for establish care, diabetes, hypertension and hyperlipidemia.  Diagnoses and associated orders for this visit:  Type 2 diabetes mellitus with hyperglycemia - Glucose (CBG) - HgB A1c - glucose monitoring kit (FREESTYLE) monitoring kit; 1 each by Does not apply route as needed for other. - glucose blood test strip; Use as instructed - Lancets (FREESTYLE) lancets; Use as instructed - POCT urinalysis dipstick  Encounter to establish  care - TSH; Future - COMPLETE METABOLIC PANEL WITH GFR; Future - CBC; Future - Lipid panel; Future     Return for 1 week CBG, BP and lab visit and 3 week CBG nurse visit.... 2 months PCP.    Raymond Winters, [facility] [phone] 01/09/2014, 8:11 PM

## 2013-12-27 NOTE — Patient Instructions (Signed)
DASH Eating Plan DASH stands for "Dietary Approaches to Stop Hypertension." The DASH eating plan is a healthy eating plan that has been shown to reduce high blood pressure (hypertension). Additional health benefits may include reducing the risk of type 2 diabetes mellitus, heart disease, and stroke. The DASH eating plan may also help with weight loss. WHAT DO I NEED TO KNOW ABOUT THE DASH EATING PLAN? For the DASH eating plan, you will follow these general guidelines:  Choose foods with a percent daily value for sodium of less than 5% (as listed on the food label).  Use salt-free seasonings or herbs instead of table salt or sea salt.  Check with your health care provider or pharmacist before using salt substitutes.  Eat lower-sodium products, often labeled as "lower sodium" or "no salt added."  Eat fresh foods.  Eat more vegetables, fruits, and low-fat dairy products.  Choose whole grains. Look for the word "whole" as the first word in the ingredient list.  Choose fish and skinless chicken or turkey more often than red meat. Limit fish, poultry, and meat to 6 oz (170 g) each day.  Limit sweets, desserts, sugars, and sugary drinks.  Choose heart-healthy fats.  Limit cheese to 1 oz (28 g) per day.  Eat more home-cooked food and less restaurant, buffet, and fast food.  Limit fried foods.  Cook foods using methods other than frying.  Limit canned vegetables. If you do use them, rinse them well to decrease the sodium.  When eating at a restaurant, ask that your food be prepared with less salt, or no salt if possible. WHAT FOODS CAN I EAT? Seek help from a dietitian for individual calorie needs. Grains Whole grain or whole wheat bread. Brown rice. Whole grain or whole wheat pasta. Quinoa, bulgur, and whole grain cereals. Low-sodium cereals. Corn or whole wheat flour tortillas. Whole grain cornbread. Whole grain crackers. Low-sodium crackers. Vegetables Fresh or frozen vegetables  (raw, steamed, roasted, or grilled). Low-sodium or reduced-sodium tomato and vegetable juices. Low-sodium or reduced-sodium tomato sauce and paste. Low-sodium or reduced-sodium canned vegetables.  Fruits All fresh, canned (in natural juice), or frozen fruits. Meat and Other Protein Products Ground beef (85% or leaner), grass-fed beef, or beef trimmed of fat. Skinless chicken or turkey. Ground chicken or turkey. Pork trimmed of fat. All fish and seafood. Eggs. Dried beans, peas, or lentils. Unsalted nuts and seeds. Unsalted canned beans. Dairy Low-fat dairy products, such as skim or 1% milk, 2% or reduced-fat cheeses, low-fat ricotta or cottage cheese, or plain low-fat yogurt. Low-sodium or reduced-sodium cheeses. Fats and Oils Tub margarines without trans fats. Light or reduced-fat mayonnaise and salad dressings (reduced sodium). Avocado. Safflower, olive, or canola oils. Natural peanut or almond butter. Other Unsalted popcorn and pretzels. The items listed above may not be a complete list of recommended foods or beverages. Contact your dietitian for more options. WHAT FOODS ARE NOT RECOMMENDED? Grains White bread. White pasta. White rice. Refined cornbread. Bagels and croissants. Crackers that contain trans fat. Vegetables Creamed or fried vegetables. Vegetables in a cheese sauce. Regular canned vegetables. Regular canned tomato sauce and paste. Regular tomato and vegetable juices. Fruits Dried fruits. Canned fruit in light or heavy syrup. Fruit juice. Meat and Other Protein Products Fatty cuts of meat. Ribs, chicken wings, bacon, sausage, bologna, salami, chitterlings, fatback, hot dogs, bratwurst, and packaged luncheon meats. Salted nuts and seeds. Canned beans with salt. Dairy Whole or 2% milk, cream, half-and-half, and cream cheese. Whole-fat or sweetened yogurt. Full-fat   cheeses or blue cheese. Nondairy creamers and whipped toppings. Processed cheese, cheese spreads, or cheese  curds. Condiments Onion and garlic salt, seasoned salt, table salt, and sea salt. Canned and packaged gravies. Worcestershire sauce. Tartar sauce. Barbecue sauce. Teriyaki sauce. Soy sauce, including reduced sodium. Steak sauce. Fish sauce. Oyster sauce. Cocktail sauce. Horseradish. Ketchup and mustard. Meat flavorings and tenderizers. Bouillon cubes. Hot sauce. Tabasco sauce. Marinades. Taco seasonings. Relishes. Fats and Oils Butter, stick margarine, lard, shortening, ghee, and bacon fat. Coconut, palm kernel, or palm oils. Regular salad dressings. Other Pickles and olives. Salted popcorn and pretzels. The items listed above may not be a complete list of foods and beverages to avoid. Contact your dietitian for more information. WHERE CAN I FIND MORE INFORMATION? National Heart, Lung, and Blood Institute: www.nhlbi.nih.gov/health/health-topics/topics/dash/ Document Released: 12/0CablePromo.it6/2012 Document Revised: 06/13/2013 Document Reviewed: 04/12/2013 [facility] Patient Information 2015 [place], [place]. This information is not intended to replace advice given to you by your health care provider. Make sure you discuss any questions you have with your health care provider. Diabetes and Foot Care Diabetes may cause you to have problems because of poor blood supply (circulation) to your feet and legs. This may cause the skin on your feet to become thinner, break easier, and heal more slowly. Your skin may become dry, and the skin may peel and crack. You may also have nerve damage in your legs and feet causing decreased feeling in them. You may not notice minor injuries to your feet that could lead to infections or more serious problems. Taking care of your feet is one of the most important things you can do for yourself.  HOME CARE INSTRUCTIONS  Wear shoes at all times, even in the house. Do not go barefoot. Bare feet are easily injured.  Check your feet daily for blisters, cuts, and redness. If you cannot see  the bottom of your feet, use a mirror or ask someone for help.  Wash your feet with warm water (do not use hot water) and mild soap. Then pat your feet and the areas between your toes until they are completely dry. Do not soak your feet as this can dry your skin.  Apply a moisturizing lotion or petroleum jelly (that does not contain alcohol and is unscented) to the skin on your feet and to dry, brittle toenails. Do not apply lotion between your toes.  Trim your toenails straight across. Do not dig under them or around the cuticle. File the edges of your nails with an emery board or nail file.  Do not cut corns or calluses or try to remove them with medicine.  Wear clean socks or stockings every day. Make sure they are not too tight. Do not wear knee-high stockings since they may decrease blood flow to your legs.  Wear shoes that fit properly and have enough cushioning. To break in new shoes, wear them for just a few hours a day. This prevents you from injuring your feet. Always look in your shoes before you put them on to be sure there are no objects inside.  Do not cross your legs. This may decrease the blood flow to your feet.  If you find a minor scrape, cut, or break in the skin on your feet, keep it and the skin around it clean and dry. These areas may be cleansed with mild soap and water. Do not cleanse the area with peroxide, alcohol, or iodine.  When you remove an adhesive bandage, be sure not to  damage the skin around it.  If you have a wound, look at it several times a day to make sure it is healing.  Do not use heating pads or hot water bottles. They may burn your skin. If you have lost feeling in your feet or legs, you may not know it is happening until it is too late.  Make sure your health care provider performs a complete foot exam at least annually or more often if you have foot problems. Report any cuts, sores, or bruises to your health care provider immediately. SEEK  MEDICAL CARE IF:   You have an injury that is not healing.  You have cuts or breaks in the skin.  You have an ingrown nail.  You notice redness on your legs or feet.  You feel burning or tingling in your legs or feet.  You have pain or cramps in your legs and feet.  Your legs or feet are numb.  Your feet always feel cold. SEEK IMMEDIATE MEDICAL CARE IF:   There is increasing redness, swelling, or pain in or around a wound.  There is a red line that goes up your leg.  Pus is coming from a wound.  You develop a fever or as directed by your health care provider.  You notice a bad smell coming from an ulcer or wound. Document Released: 06/05/2000 Document Revised: 02/08/2013 Document Reviewed: 11/15/2012 [facility] Patient Information 2015 [place], [place]. This information is not intended to replace advice given to you by your health care provider. Make sure you discuss any questions you have with your health care provider.

## 2014-01-03 ENCOUNTER — Ambulatory Visit: Payer: Non-veteran care | Attending: Internal Medicine

## 2014-01-03 DIAGNOSIS — Z7689 Persons encountering health services in other specified circumstances: Secondary | ICD-10-CM

## 2014-01-03 LAB — CBC
HCT: 34.1 % — ABNORMAL LOW (ref 39.0–52.0)
Hemoglobin: 11.4 g/dL — ABNORMAL LOW (ref 13.0–17.0)
MCH: 27.5 pg (ref 26.0–34.0)
MCHC: 33.4 g/dL (ref 30.0–36.0)
MCV: 82.2 fL (ref 78.0–100.0)
PLATELETS: 185 10*3/uL (ref 150–400)
RBC: 4.15 MIL/uL — ABNORMAL LOW (ref 4.22–5.81)
RDW: 17.5 % — AB (ref 11.5–15.5)
WBC: 3.7 10*3/uL — AB (ref 4.0–10.5)

## 2014-01-03 LAB — COMPLETE METABOLIC PANEL WITH GFR
ALBUMIN: 3.7 g/dL (ref 3.5–5.2)
ALT: 36 U/L (ref 0–53)
AST: 48 U/L — ABNORMAL HIGH (ref 0–37)
Alkaline Phosphatase: 83 U/L (ref 39–117)
BUN: 12 mg/dL (ref 6–23)
CALCIUM: 9.4 mg/dL (ref 8.4–10.5)
CHLORIDE: 104 meq/L (ref 96–112)
CO2: 27 mEq/L (ref 19–32)
Creat: 0.86 mg/dL (ref 0.50–1.35)
GFR, Est African American: >89 mL/min
GFR, Est Non African American: >89 mL/min
Glucose, Bld: 157 mg/dL — ABNORMAL HIGH (ref 70–99)
Potassium: 5.3 mEq/L (ref 3.5–5.3)
Sodium: 140 mEq/L (ref 135–145)
Total Bilirubin: 0.7 mg/dL (ref 0.2–1.2)
Total Protein: 6.5 g/dL (ref 6.0–8.3)

## 2014-01-03 LAB — TSH: TSH: 1.948 u[IU]/mL (ref 0.350–4.500)

## 2014-01-03 LAB — LIPID PANEL
Cholesterol: 86 mg/dL (ref 0–200)
HDL: 40 mg/dL (ref >39)
LDL Cholesterol: 16 mg/dL (ref 0–99)
Total CHOL/HDL Ratio: 2.2 Ratio
Triglycerides: 149 mg/dL (ref <150)
VLDL: 30 mg/dL (ref 0–40)

## 2014-01-16 ENCOUNTER — Telehealth: Payer: Self-pay | Admitting: *Deleted

## 2014-01-16 NOTE — Telephone Encounter (Signed)
Message copied by Fredderick SeveranceUCATTE, LAURENZE L on Tue Jan 16, 2014  2:34 PM ------      Message from: Holland CommonsKECK, VALERIE A      Created: Thu Jan 04, 2014  7:13 PM       Please send patient Rx of ferrous sulfate 325 to be taken BID for anemia.  Cholesterol is WNL. ------

## 2014-01-16 NOTE — Telephone Encounter (Signed)
Patient's sister answered cell phone and stated that patient is incarcerated. States she will have patient come by office when he is out to pick up medication. Provider aware.

## 2014-01-24 ENCOUNTER — Other Ambulatory Visit: Payer: Non-veteran care

## 2014-02-28 ENCOUNTER — Ambulatory Visit: Payer: Non-veteran care | Admitting: Internal Medicine

## 2014-04-04 ENCOUNTER — Encounter: Payer: Self-pay | Admitting: Internal Medicine

## 2014-04-04 ENCOUNTER — Ambulatory Visit: Payer: Non-veteran care | Attending: Internal Medicine | Admitting: Internal Medicine

## 2014-04-04 VITALS — BP 144/96 | HR 91 | Temp 98.0°F | Resp 16 | Ht 67.0 in | Wt 144.0 lb

## 2014-04-04 DIAGNOSIS — E119 Type 2 diabetes mellitus without complications: Secondary | ICD-10-CM

## 2014-04-04 DIAGNOSIS — Z833 Family history of diabetes mellitus: Secondary | ICD-10-CM | POA: Insufficient documentation

## 2014-04-04 DIAGNOSIS — Z7982 Long term (current) use of aspirin: Secondary | ICD-10-CM | POA: Insufficient documentation

## 2014-04-04 DIAGNOSIS — E785 Hyperlipidemia, unspecified: Secondary | ICD-10-CM

## 2014-04-04 DIAGNOSIS — I10 Essential (primary) hypertension: Secondary | ICD-10-CM | POA: Insufficient documentation

## 2014-04-04 DIAGNOSIS — F1110 Opioid abuse, uncomplicated: Secondary | ICD-10-CM

## 2014-04-04 DIAGNOSIS — Z720 Tobacco use: Secondary | ICD-10-CM

## 2014-04-04 LAB — GLUCOSE, POCT (MANUAL RESULT ENTRY): POC GLUCOSE: 303 mg/dL — AB (ref 70–99)

## 2014-04-04 MED ORDER — GLIPIZIDE 10 MG PO TABS: 10.0000 mg | ORAL_TABLET | Freq: Two times a day (BID) | ORAL | Status: DC

## 2014-04-04 MED ORDER — ASPIRIN EC 81 MG PO TBEC: 81.0000 mg | DELAYED_RELEASE_TABLET | Freq: Every day | ORAL | Status: AC

## 2014-04-04 MED ORDER — PRAVASTATIN SODIUM 40 MG PO TABS: 40.0000 mg | ORAL_TABLET | Freq: Every day | ORAL | Status: DC

## 2014-04-04 MED ORDER — METFORMIN HCL 1000 MG PO TABS: 1000.0000 mg | ORAL_TABLET | Freq: Two times a day (BID) | ORAL | Status: DC

## 2014-04-04 NOTE — Progress Notes (Signed)
Patient ID: Raymond Winters, male   DOB: August 11, 1960, 53 y.o.   MRN: [id number]  CC: follow up  HPI: Patient presents to clinic today for a follow up of his diabetes mellitus.  He was recently released from jail over one week ago and has been out of all medications since then.  He reports that he was on Metformin, Glipizide, and insulin while incarcerated.  He reports that he was not on a diabetic diet in jail and ate several carbs while incarcerated.  He states that he has not used heroin since his release but does not feel a drug abuse program will be beneficial because he has been through so many in the past.    No Known Allergies Past Medical History  Diagnosis Date  . Heroin abuse   . Diabetes mellitus without complication   . Hyperlipidemia   . Hypertension    Current Outpatient Prescriptions on File Prior to Visit  Medication Sig Dispense Refill  . aspirin 81 MG chewable tablet Chew 81 mg by mouth once.      . Cholecalciferol (VITAMIN D-3) 1000 UNITS CAPS Take 1 capsule by mouth daily.      . feeding supplement, GLUCERNA SHAKE, (GLUCERNA SHAKE) LIQD Take 237 mLs by mouth 2 (two) times daily between meals.    0  . glipiZIDE (GLUCOTROL) 10 MG tablet Take 10 mg by mouth 2 (two) times daily before a meal.      . glucose blood test strip Use as instructed  100 each  12  . glucose monitoring kit (FREESTYLE) monitoring kit 1 each by Does not apply route as needed for other.  1 each  0  . Lancets (FREESTYLE) lancets Use as instructed  100 each  12  . metFORMIN (GLUCOPHAGE) 1000 MG tablet Take 1 tablet (1,000 mg total) by mouth 2 (two) times daily with a meal.  60 tablet  0  . Multiple Vitamin (MULTIVITAMIN WITH MINERALS) TABS tablet Take 1 tablet by mouth daily.      . pravastatin (PRAVACHOL) 40 MG tablet Take 40 mg by mouth daily.       No current facility-administered medications on file prior to visit.   Family History  Problem Relation Age of Onset  . Diabetes Mother   . Heart disease Mother    . Hypertension Mother   . Stroke Mother   . Vision loss Mother   . Heart disease Father   . Hypertension Father    History   Social History  . Marital Status: Single    Spouse Name: N/A    Number of Children: N/A  . Years of Education: N/A   Occupational History  . Not on file.   Social History Main Topics  . Smoking status: Current Every Day Smoker -- 1.10 packs/day for 11 years  . Smokeless tobacco: Never Used     Comment: encouraged to quit  . Alcohol Use: Yes  . Drug Use: Yes    Special: IV     Comment: heroin  . Sexual Activity: Not on file   Other Topics Concern  . Not on file   Social History Narrative  . No narrative on file    Review of Systems  Eyes: Positive for blurred vision.  Respiratory: Negative.   Cardiovascular: Negative.   Gastrointestinal: Positive for diarrhea (metformin use).  Genitourinary: Positive for frequency.  Musculoskeletal: Negative.   Neurological: Positive for tingling. Negative for dizziness and headaches.  Endo/Heme/Allergies: Positive for polydipsia.  Objective:   Filed Vitals:   04/04/14 1516  BP: 158/93  Pulse: 91  Temp: 98 F (36.7 C)  Resp: 16   Physical Exam  Constitutional: He is oriented to person, place, and time.  Cardiovascular: Normal rate, regular rhythm and normal heart sounds.   Pulmonary/Chest: Effort normal and breath sounds normal.  Abdominal: Soft. Bowel sounds are normal.  Musculoskeletal: Normal range of motion. He exhibits no edema and no tenderness.  Neurological: He is alert and oriented to person, place, and time.  Skin: Skin is warm and dry.     Lab Results  Component Value Date   WBC 3.7* 01/03/2014   HGB 11.4* 01/03/2014   HCT 34.1* 01/03/2014   MCV 82.2 01/03/2014   PLT 185 01/03/2014   Lab Results  Component Value Date   CREATININE 0.86 01/03/2014   BUN 12 01/03/2014   NA 140 01/03/2014   K 5.3 01/03/2014   CL 104 01/03/2014   CO2 27 01/03/2014    Lab Results  Component  Value Date   HGBA1C 7.9 12/27/2013   Lipid Panel     Component Value Date/Time   CHOL 86 01/03/2014 0948   TRIG 149 01/03/2014 0948   HDL 40 01/03/2014 0948   CHOLHDL 2.2 01/03/2014 0948   VLDL 30 01/03/2014 0948   LDLCALC 16 01/03/2014 0948       Assessment and plan:   Raymond Winters was seen today for follow-up.  Diagnoses and associated orders for this visit:  Type 2 diabetes mellitus without complication - Glucose (CBG) - HgB A1c - Refill glipiZIDE (GLUCOTROL) 10 MG tablet; Take 1 tablet (10 mg total) by mouth 2 (two) times daily before a meal. - Refill metFORMIN (GLUCOPHAGE) 1000 MG tablet; Take 1 tablet (1,000 mg total) by mouth 2 (two) times daily with a meal. - Ambulatory referral to Podiatry - Microalbumin, urine  HLD (hyperlipidemia) - Refill aspirin EC 81 MG tablet; Take 1 tablet (81 mg total) by mouth daily. - Refill pravastatin (PRAVACHOL) 40 MG tablet; Take 1 tablet (40 mg total) by mouth daily. Continue low fat/dash diet. Stressed exercise regimen  Heroin abuse Stressed that patient consider a substance abuse program for support, encouraged counseling and gave patient information to [facility]  Tobacco abuse Patient is not ready to quit, will address on each visit,  Explained risk of cardiovascular disease   Return in about 3 weeks (around 04/25/2014) for Nurse Visit-cbg check and 3 mo PCP.        Chari Manning, NP-C [facility] [phone] 04/08/2014, 7:41 PM

## 2014-04-04 NOTE — Progress Notes (Signed)
Pt is here following up on his HTN, hyperlipidemia and diabetes.

## 2014-04-04 NOTE — Patient Instructions (Addendum)
Smoking Cessation Quitting smoking is important to your health and has many advantages. However, it is not always easy to quit since nicotine is a very addictive drug. Oftentimes, people try 3 times or more before being able to quit. This document explains the best ways for you to prepare to quit smoking. Quitting takes hard work and a lot of effort, but you can do it. ADVANTAGES OF QUITTING SMOKING  You will live longer, feel better, and live better.  Your body will feel the impact of quitting smoking almost immediately.  Within 20 minutes, blood pressure decreases. Your pulse returns to its normal level.  After 8 hours, carbon monoxide levels in the blood return to normal. Your oxygen level increases.  After 24 hours, the chance of having a heart attack starts to decrease. Your breath, hair, and body stop smelling like smoke.  After 48 hours, damaged nerve endings begin to recover. Your sense of taste and smell improve.  After 72 hours, the body is virtually free of nicotine. Your bronchial tubes relax and breathing becomes easier.  After 2 to 12 weeks, lungs can hold more air. Exercise becomes easier and circulation improves.  The risk of having a heart attack, stroke, cancer, or lung disease is greatly reduced.  After 1 year, the risk of coronary heart disease is cut in half.  After 5 years, the risk of stroke falls to the same as a nonsmoker.  After 10 years, the risk of lung cancer is cut in half and the risk of other cancers decreases significantly.  After 15 years, the risk of coronary heart disease drops, usually to the level of a nonsmoker.  If you are pregnant, quitting smoking will improve your chances of having a healthy baby.  The people you live with, especially any children, will be healthier.  You will have extra money to spend on things other than cigarettes. QUESTIONS TO THINK ABOUT BEFORE ATTEMPTING TO QUIT You may want to talk about your answers with your  health care provider.  Why do you want to quit?  If you tried to quit in the past, what helped and what did not?  What will be the most difficult situations for you after you quit? How will you plan to handle them?  Who can help you through the tough times? Your family? Friends? A health care provider?  What pleasures do you get from smoking? What ways can you still get pleasure if you quit? Here are some questions to ask your health care provider:  How can you help me to be successful at quitting?  What medicine do you think would be best for me and how should I take it?  What should I do if I need more help?  What is smoking withdrawal like? How can I get information on withdrawal? GET READY  Set a quit date.  Change your environment by getting rid of all cigarettes, ashtrays, matches, and lighters in your home, car, or work. Do not let people smoke in your home.  Review your past attempts to quit. Think about what worked and what did not. GET SUPPORT AND ENCOURAGEMENT You have a better chance of being successful if you have help. You can get support in many ways.  Tell your family, friends, and coworkers that you are going to quit and need their support. Ask them not to smoke around you.  Get individual, group, or telephone counseling and support. Programs are available at local hospitals and health centers. Call   your local health department for information about programs in your area.  Spiritual beliefs and practices may help some smokers quit.  Download a "quit meter" on your computer to keep track of quit statistics, such as how long you have gone without smoking, cigarettes not smoked, and money saved.  Get a self-help book about quitting smoking and staying off tobacco. LEARN NEW SKILLS AND BEHAVIORS  Distract yourself from urges to smoke. Talk to someone, go for a walk, or occupy your time with a task.  Change your normal routine. Take a different route to work.  Drink tea instead of coffee. Eat breakfast in a different place.  Reduce your stress. Take a hot bath, exercise, or read a book.  Plan something enjoyable to do every day. Reward yourself for not smoking.  Explore interactive web-based programs that specialize in helping you quit. GET MEDICINE AND USE IT CORRECTLY Medicines can help you stop smoking and decrease the urge to smoke. Combining medicine with the above behavioral methods and support can greatly increase your chances of successfully quitting smoking.  Nicotine replacement therapy helps deliver nicotine to your body without the negative effects and risks of smoking. Nicotine replacement therapy includes nicotine gum, lozenges, inhalers, nasal sprays, and skin patches. Some may be available over-the-counter and others require a prescription.  Antidepressant medicine helps people abstain from smoking, but how this works is unknown. This medicine is available by prescription.  Nicotinic receptor partial agonist medicine simulates the effect of nicotine in your brain. This medicine is available by prescription. Ask your health care provider for advice about which medicines to use and how to use them based on your health history. Your health care provider will tell you what side effects to look out for if you choose to be on a medicine or therapy. Carefully read the information on the package. Do not use any other product containing nicotine while using a nicotine replacement product.  RELAPSE OR DIFFICULT SITUATIONS Most relapses occur within the first 3 months after quitting. Do not be discouraged if you start smoking again. Remember, most people try several times before finally quitting. You may have symptoms of withdrawal because your body is used to nicotine. You may crave cigarettes, be irritable, feel very hungry, cough often, get headaches, or have difficulty concentrating. The withdrawal symptoms are only temporary. They are strongest  when you first quit, but they will go away within 10-14 days. To reduce the chances of relapse, try to:  Avoid drinking alcohol. Drinking lowers your chances of successfully quitting.  Reduce the amount of caffeine you consume. Once you quit smoking, the amount of caffeine in your body increases and can give you symptoms, such as a rapid heartbeat, sweating, and anxiety.  Avoid smokers because they can make you want to smoke.  Do not let weight gain distract you. Many smokers will gain weight when they quit, usually less than 10 pounds. Eat a healthy diet and stay active. You can always lose the weight gained after you quit.  Find ways to improve your mood other than smoking. FOR MORE INFORMATION  www.smokefree.gov  Document Released: 06/02/2001 Document Revised: 10/23/2013 Document Reviewed: 09/17/2011 [employer] Patient Information 2015 [employer]. This information is not intended to replace advice given to you by your health care provider. Make sure you discuss any questions you have with your health care provider. DASH Eating Plan DASH stands for "Dietary Approaches to Stop Hypertension." The DASH eating plan is a healthy eating plan that has   been shown to reduce high blood pressure (hypertension). Additional health benefits may include reducing the risk of type 2 diabetes mellitus, heart disease, and stroke. The DASH eating plan may also help with weight loss. WHAT DO I NEED TO KNOW ABOUT THE DASH EATING PLAN? For the DASH eating plan, you will follow these general guidelines:  Choose foods with a percent daily value for sodium of less than 5% (as listed on the food label).  Use salt-free seasonings or herbs instead of table salt or sea salt.  Check with your health care provider or pharmacist before using salt substitutes.  Eat lower-sodium products, often labeled as "lower sodium" or "no salt added."  Eat fresh foods.  Eat more vegetables, fruits, and low-fat dairy  products.  Choose whole grains. Look for the word "whole" as the first word in the ingredient list.  Choose fish and skinless chicken or turkey more often than red meat. Limit fish, poultry, and meat to 6 oz (170 g) each day.  Limit sweets, desserts, sugars, and sugary drinks.  Choose heart-healthy fats.  Limit cheese to 1 oz (28 g) per day.  Eat more home-cooked food and less restaurant, buffet, and fast food.  Limit fried foods.  Cook foods using methods other than frying.  Limit canned vegetables. If you do use them, rinse them well to decrease the sodium.  When eating at a restaurant, ask that your food be prepared with less salt, or no salt if possible. WHAT FOODS CAN I EAT? Seek help from a dietitian for individual calorie needs. Grains Whole grain or whole wheat bread. Brown rice. Whole grain or whole wheat pasta. Quinoa, bulgur, and whole grain cereals. Low-sodium cereals. Corn or whole wheat flour tortillas. Whole grain cornbread. Whole grain crackers. Low-sodium crackers. Vegetables Fresh or frozen vegetables (raw, steamed, roasted, or grilled). Low-sodium or reduced-sodium tomato and vegetable juices. Low-sodium or reduced-sodium tomato sauce and paste. Low-sodium or reduced-sodium canned vegetables.  Fruits All fresh, canned (in natural juice), or frozen fruits. Meat and Other Protein Products Ground beef (85% or leaner), grass-fed beef, or beef trimmed of fat. Skinless chicken or turkey. Ground chicken or turkey. Pork trimmed of fat. All fish and seafood. Eggs. Dried beans, peas, or lentils. Unsalted nuts and seeds. Unsalted canned beans. Dairy Low-fat dairy products, such as skim or 1% milk, 2% or reduced-fat cheeses, low-fat ricotta or cottage cheese, or plain low-fat yogurt. Low-sodium or reduced-sodium cheeses. Fats and Oils Tub margarines without trans fats. Light or reduced-fat mayonnaise and salad dressings (reduced sodium). Avocado. Safflower, olive, or canola  oils. Natural peanut or almond butter. Other Unsalted popcorn and pretzels. The items listed above may not be a complete list of recommended foods or beverages. Contact your dietitian for more options. WHAT FOODS ARE NOT RECOMMENDED? Grains White bread. White pasta. White rice. Refined cornbread. Bagels and croissants. Crackers that contain trans fat. Vegetables Creamed or fried vegetables. Vegetables in a cheese sauce. Regular canned vegetables. Regular canned tomato sauce and paste. Regular tomato and vegetable juices. Fruits Dried fruits. Canned fruit in light or heavy syrup. Fruit juice. Meat and Other Protein Products Fatty cuts of meat. Ribs, chicken wings, bacon, sausage, bologna, salami, chitterlings, fatback, hot dogs, bratwurst, and packaged luncheon meats. Salted nuts and seeds. Canned beans with salt. Dairy Whole or 2% milk, cream, half-and-half, and cream cheese. Whole-fat or sweetened yogurt. Full-fat cheeses or blue cheese. Nondairy creamers and whipped toppings. Processed cheese, cheese spreads, or cheese curds. Condiments Onion and garlic salt,   seasoned salt, table salt, and sea salt. Canned and packaged gravies. Worcestershire sauce. Tartar sauce. Barbecue sauce. Teriyaki sauce. Soy sauce, including reduced sodium. Steak sauce. Fish sauce. Oyster sauce. Cocktail sauce. Horseradish. Ketchup and mustard. Meat flavorings and tenderizers. Bouillon cubes. Hot sauce. Tabasco sauce. Marinades. Taco seasonings. Relishes. Fats and Oils Butter, stick margarine, lard, shortening, ghee, and bacon fat. Coconut, palm kernel, or palm oils. Regular salad dressings. Other Pickles and olives. Salted popcorn and pretzels. The items listed above may not be a complete list of foods and beverages to avoid. Contact your dietitian for more information. WHERE CAN I FIND MORE INFORMATION? [employer], [employer], [employer]: www.nhlbi.nih.gov/health/health-topics/topics/dash/ Document Released:  05/28/2011 Document Revised: 10/23/2013 Document Reviewed: 04/12/2013 [employer] Patient Information 2015 [employer]. This information is not intended to replace advice given to you by your health care provider. Make sure you discuss any questions you have with your health care provider.  

## 2014-04-06 LAB — MICROALBUMIN, URINE: Microalb, Ur: 2.9 mg/dL — ABNORMAL HIGH (ref <2.0)

## 2014-04-25 ENCOUNTER — Ambulatory Visit: Payer: Non-veteran care | Attending: Internal Medicine

## 2014-04-25 DIAGNOSIS — E119 Type 2 diabetes mellitus without complications: Secondary | ICD-10-CM

## 2014-04-25 LAB — GLUCOSE, POCT (MANUAL RESULT ENTRY): POC Glucose: 334 mg/dl — AB (ref 70–99)

## 2014-04-25 MED ORDER — INSULIN GLARGINE 300 UNIT/ML ~~LOC~~ SOPN: 10.0000 [IU] | PEN_INJECTOR | Freq: Every day | SUBCUTANEOUS | Status: DC

## 2014-04-25 MED ORDER — IBUPROFEN 600 MG PO TABS: 600.0000 mg | ORAL_TABLET | Freq: Three times a day (TID) | ORAL | Status: DC | PRN

## 2014-04-25 MED ORDER — INSULIN GLARGINE 300 UNIT/ML ~~LOC~~ SOPN
10.0000 [IU] | PEN_INJECTOR | Freq: Every day | SUBCUTANEOUS | Status: DC
Start: 2014-04-25 — End: 2014-07-19

## 2014-04-25 NOTE — Progress Notes (Signed)
Pt comes in today for Cbg recheck after being restarted on Glipizide and Metformin s/p incarceration Pt admits poor appetite with Diabetes. States he only ate potatoes today [id number], 10 units given per protocol C/o right shoulder pain [place], we will hold off on Glipizide and start pt on Toujeo 10 units @ bedtime

## 2014-04-25 NOTE — Patient Instructions (Signed)
Hold off from taking Glipizide for now  Start taking Toujeo 10 units @ bedtime Return to clinic in 2 weeks for nurse visit,cbg with log Insulin Glargine injection What is this medicine? INSULIN GLARGINE (IN su lin GLAR geen) is a human-made form of insulin. This drug lowers the amount of sugar in your blood. It is a long-acting insulin that is usually given once a day. This medicine may be used for other purposes; ask your health care provider or pharmacist if you have questions. COMMON BRAND NAME(S): Lantus, Lantus SoloStar, Toujeo SoloStar What should I tell my health care provider before I take this medicine? They need to know if you have any of these conditions: -episodes of hypoglycemia -kidney disease -liver disease -an unusual or allergic reaction to insulin, metacresol, other medicines, foods, dyes, or preservatives -pregnant or trying to get pregnant -breast-feeding How should I use this medicine? This medicine is for injection under the skin. Use this medicine at the same time each day. Use exactly as directed. This insulin should never be mixed in the same syringe with other insulins before injection. Do not vigorously shake before use. You will be taught how to use this medicine and how to adjust doses for activities and illness. Do not use more insulin than prescribed. Always check the appearance of your insulin before using it. This medicine should be clear and colorless like water. Do not use it if it is cloudy, thickened, colored, or has solid particles in it. It is important that you put your used needles and syringes in a special sharps container. Do not put them in a trash can. If you do not have a sharps container, call your pharmacist or healthcare provider to get one. Talk to your pediatrician regarding the use of this medicine in children. Special care may be needed. Overdosage: If you think you have taken too much of this medicine contact a poison control center or  emergency room at once. NOTE: This medicine is only for you. Do not share this medicine with others. What if I miss a dose? It is important not to miss a dose. Your health care professional or doctor should discuss a plan for missed doses with you. If you do miss a dose, follow their plan. Do not take double doses. What may interact with this medicine? -other medicines for diabetes Many medications may cause an increase or decrease in blood sugar, these include: -alcohol containing beverages -aspirin and aspirin-like drugs -chloramphenicol -chromium -diuretics -male hormones, like estrogens or progestins and birth control pills -heart medicines -isoniazid -male hormones or anabolic steroids -medicines for weight loss -medicines for allergies, asthma, cold, or cough -medicines for mental problems -medicines called MAO Inhibitors like Nardil, Parnate, Marplan, Eldepryl -niacin -NSAIDs, medicines for pain and inflammation, like ibuprofen or naproxen -pentamidine -phenytoin -probenecid -quinolone antibiotics like ciprofloxacin, levofloxacin, ofloxacin -some herbal dietary supplements -steroid medicines like prednisone or cortisone -thyroid medicine Some medications can hide the warning symptoms of low blood sugar. You may need to monitor your blood sugar more closely if you are taking one of these medications. These include: -beta-blockers such as atenolol, metoprolol, propranolol -clonidine -guanethidine -reserpine This list may not describe all possible interactions. Give your health care provider a list of all the medicines, herbs, non-prescription drugs, or dietary supplements you use. Also tell them if you smoke, drink alcohol, or use illegal drugs. Some items may interact with your medicine. What should I watch for while using this medicine? Visit your health care professional  or doctor for regular checks on your progress. A test called the HbA1C (A1C) will be monitored. This  is a simple blood test. It measures your blood sugar control over the last 2 to 3 months. You will receive this test every 3 to 6 months. Learn how to check your blood sugar. Learn the symptoms of low and high blood sugar and how to manage them. Always carry a quick-source of sugar with you in case you have symptoms of low blood sugar. Examples include hard sugar candy or glucose tablets. Make sure others know that you can choke if you eat or drink when you develop serious symptoms of low blood sugar, such as seizures or unconsciousness. They must get medical help at once. Tell your doctor or health care professional if you have high blood sugar. You might need to change the dose of your medicine. If you are sick or exercising more than usual, you might need to change the dose of your medicine. Do not skip meals. Ask your doctor or health care professional if you should avoid alcohol. Many nonprescription cough and cold products contain sugar or alcohol. These can affect blood sugar. Make sure that you have the right kind of syringe for the type of insulin you use. Try not to change the brand and type of insulin or syringe unless your health care professional or doctor tells you to. Switching insulin brand or type can cause dangerously high or low blood sugar. Always keep an extra supply of insulin, syringes, and needles on hand. Use a syringe one time only. Throw away syringe and needle in a closed container to prevent accidental needle sticks. Insulin pens and cartridges should never be shared. Even if the needle is changed, sharing may result in passing of viruses like hepatitis or HIV. Wear a medical ID bracelet or chain, and carry a card that describes your disease and details of your medicine and dosage times. What side effects may I notice from receiving this medicine? Side effects that you should report to your health care professional or doctor as soon as possible: -allergic reactions like skin  rash, itching or hives, swelling of the face, lips, or tongue -breathing problems -signs and symptoms of high blood sugar such as dizziness, dry mouth, dry skin, fruity breath, nausea, stomach pain, increased hunger or thirst, increased urination -signs and symptoms of low blood sugar such as feeling anxious, confusion, dizziness, increased hunger, unusually weak or tired, sweating, shakiness, cold, irritable, headache, blurred vision, fast heartbeat, loss of consciousness Side effects that usually do not require medical attention (report to your health care professional or doctor if they continue or are bothersome): -increase or decrease in fatty tissue under the skin due to overuse of a particular injection site -itching, burning, swelling, or rash at site where injected This list may not describe all possible side effects. Call your doctor for medical advice about side effects. You may report side effects to FDA at 1-800-FDA-1088. Where should I keep my medicine? Keep out of the reach of children. Store unopened vials in a refrigerator between 2 and 8 degrees C (36 and 46 degrees F). Do not freeze or use if the insulin has been frozen. Opened vials (vials currently in use) may be stored in the refrigerator or at room temperature, at approximately 25 degrees C (77 degrees F) or cooler. Keeping your insulin at room temperature decreases the amount of pain during injection. Once opened, your insulin can be used for 28 days. After  28 days, the vial should be thrown away. Store unopened pen-injector cartridges in a refrigerator between 2 and 8 degrees C (36 and 46 degrees F.) Do not freeze or use if the insulin has been frozen. Insulin cartridges inserted into the [employer] system should be kept at room temperature, approximately 25 degrees C (77 degrees F) or cooler. Do not store in the refrigerator. Once inserted into the [employer] system, the insulin can be used for 28 days. After 28 days, the cartridge  should be thrown away. Protect from light and excessive heat. Throw away any unused medicine after the expiration date or after the specified time for room temperature storage has passed. NOTE: This sheet is a summary. It may not cover all possible information. If you have questions about this medicine, talk to your doctor, pharmacist, or health care provider.  2015, [employer]/Gold Standard. (2013-08-17 10:13:56) Diabetes Mellitus and Food It is important for you to manage your blood sugar (glucose) level. Your blood glucose level can be greatly affected by what you eat. Eating healthier foods in the appropriate amounts throughout the day at about the same time each day will help you control your blood glucose level. It can also help slow or prevent worsening of your diabetes mellitus. Healthy eating may even help you improve the level of your blood pressure and reach or maintain a healthy weight.  HOW CAN FOOD AFFECT ME? Carbohydrates Carbohydrates affect your blood glucose level more than any other type of food. Your dietitian will help you determine how many carbohydrates to eat at each meal and teach you how to count carbohydrates. Counting carbohydrates is important to keep your blood glucose at a healthy level, especially if you are using insulin or taking certain medicines for diabetes mellitus. Alcohol Alcohol can cause sudden decreases in blood glucose (hypoglycemia), especially if you use insulin or take certain medicines for diabetes mellitus. Hypoglycemia can be a life-threatening condition. Symptoms of hypoglycemia (sleepiness, dizziness, and disorientation) are similar to symptoms of having too much alcohol.  If your health care provider has given you approval to drink alcohol, do so in moderation and use the following guidelines:  Women should not have more than one drink per day, and men should not have more than two drinks per day. One drink is equal to:  12 oz of beer.  5 oz of  wine.  1 oz of hard liquor.  Do not drink on an empty stomach.  Keep yourself hydrated. Have water, diet soda, or unsweetened iced tea.  Regular soda, juice, and other mixers might contain a lot of carbohydrates and should be counted. WHAT FOODS ARE NOT RECOMMENDED? As you make food choices, it is important to remember that all foods are not the same. Some foods have fewer nutrients per serving than other foods, even though they might have the same number of calories or carbohydrates. It is difficult to get your body what it needs when you eat foods with fewer nutrients. Examples of foods that you should avoid that are high in calories and carbohydrates but low in nutrients include:  Trans fats (most processed foods list trans fats on the Nutrition Facts label).  Regular soda.  Juice.  Candy.  Sweets, such as cake, pie, doughnuts, and cookies.  Fried foods. WHAT FOODS CAN I EAT? Have nutrient-rich foods, which will nourish your body and keep you healthy. The food you should eat also will depend on several factors, including:  The calories you need.  The medicines you  take.  Your weight.  Your blood glucose level.  Your blood pressure level.  Your cholesterol level. You also should eat a variety of foods, including:  Protein, such as meat, poultry, fish, tofu, nuts, and seeds (lean animal proteins are best).  Fruits.  Vegetables.  Dairy products, such as milk, cheese, and yogurt (low fat is best).  Breads, grains, pasta, cereal, rice, and beans.  Fats such as olive oil, trans fat-free margarine, canola oil, avocado, and olives. DOES EVERYONE WITH DIABETES MELLITUS HAVE THE SAME MEAL PLAN? Because every person with diabetes mellitus is different, there is not one meal plan that works for everyone. It is very important that you meet with a dietitian who will help you create a meal plan that is just right for you. Document Released: 03/05/2005 Document Revised:  06/13/2013 Document Reviewed: 05/05/2013 [facility] Patient Information 2015 [place], [place]. This information is not intended to replace advice given to you by your health care provider. Make sure you discuss any questions you have with your health care provider.

## 2014-05-02 ENCOUNTER — Ambulatory Visit: Payer: Non-veteran care | Attending: Internal Medicine

## 2014-05-02 ENCOUNTER — Ambulatory Visit (HOSPITAL_COMMUNITY)
Admission: RE | Admit: 2014-05-02 | Discharge: 2014-05-02 | Disposition: A | Discharge status: Home / Self Care | Payer: Non-veteran care | Source: Ambulatory Visit | Attending: Internal Medicine | Admitting: Internal Medicine

## 2014-05-02 DIAGNOSIS — M25511 Pain in right shoulder: Secondary | ICD-10-CM | POA: Diagnosis present

## 2014-05-02 DIAGNOSIS — M898X2 Other specified disorders of bone, upper arm: Secondary | ICD-10-CM | POA: Diagnosis not present

## 2014-05-02 DIAGNOSIS — E119 Type 2 diabetes mellitus without complications: Secondary | ICD-10-CM

## 2014-05-07 ENCOUNTER — Telehealth: Payer: Self-pay | Admitting: *Deleted

## 2014-05-07 NOTE — Telephone Encounter (Signed)
Left message on VM to return my call. 

## 2014-05-07 NOTE — Telephone Encounter (Signed)
-----   Message from Ambrose FinlandValerie A Keck, NP sent at 05/02/2014  6:14 PM EST ----- Xray reveals degenerative changes, no fractures or dislocations.

## 2014-05-08 ENCOUNTER — Ambulatory Visit: Payer: Non-veteran care | Attending: Internal Medicine | Admitting: *Deleted

## 2014-05-08 VITALS — BP 101/63 | HR 74 | Temp 98.9°F

## 2014-05-08 DIAGNOSIS — E1165 Type 2 diabetes mellitus with hyperglycemia: Secondary | ICD-10-CM | POA: Insufficient documentation

## 2014-05-08 LAB — GLUCOSE, POCT (MANUAL RESULT ENTRY): POC GLUCOSE: 238 mg/dL — AB (ref 70–99)

## 2014-05-08 NOTE — Progress Notes (Signed)
Reviewed with patient at today's nurse visit   

## 2014-05-08 NOTE — Progress Notes (Unsigned)
Patient presents for CBG check and record review Pt states he forgot to bring record States fasting AM blood sugars ranging 75-180 And blood sugars before dinner ranging 75-180 States he has not had any readings < 70 States taking all meds as directed including metformin bid and toujeo 10 units qHS, however, states he forgot to take toujeo last night  CBG 238 2 hours after eating PB & J sandwich, chips and water  Reviewed right shoulder x-ray with patient  Patient instructed to return in 2 weeks for nurse visit for CBG check when he hasn't forgotten to take toujeo and to bring record to next appt. Patient states he will

## 2014-05-25 ENCOUNTER — Ambulatory Visit: Payer: Non-veteran care

## 2014-05-28 ENCOUNTER — Other Ambulatory Visit: Payer: Non-veteran care

## 2014-07-09 ENCOUNTER — Emergency Department (HOSPITAL_COMMUNITY)
Admission: EM | Admit: 2014-07-09 | Discharge: 2014-07-09 | Disposition: A | Discharge status: Home / Self Care | Payer: Non-veteran care | Attending: Emergency Medicine | Admitting: Emergency Medicine

## 2014-07-09 ENCOUNTER — Encounter (HOSPITAL_COMMUNITY): Payer: Self-pay | Admitting: *Deleted

## 2014-07-09 DIAGNOSIS — Z79899 Other long term (current) drug therapy: Secondary | ICD-10-CM | POA: Insufficient documentation

## 2014-07-09 DIAGNOSIS — Z720 Tobacco use: Secondary | ICD-10-CM | POA: Diagnosis not present

## 2014-07-09 DIAGNOSIS — L02413 Cutaneous abscess of right upper limb: Secondary | ICD-10-CM | POA: Diagnosis not present

## 2014-07-09 DIAGNOSIS — I10 Essential (primary) hypertension: Secondary | ICD-10-CM | POA: Diagnosis not present

## 2014-07-09 DIAGNOSIS — Z7982 Long term (current) use of aspirin: Secondary | ICD-10-CM | POA: Diagnosis not present

## 2014-07-09 DIAGNOSIS — E119 Type 2 diabetes mellitus without complications: Secondary | ICD-10-CM | POA: Diagnosis not present

## 2014-07-09 DIAGNOSIS — L0291 Cutaneous abscess, unspecified: Secondary | ICD-10-CM

## 2014-07-09 DIAGNOSIS — Z794 Long term (current) use of insulin: Secondary | ICD-10-CM | POA: Insufficient documentation

## 2014-07-09 DIAGNOSIS — L03113 Cellulitis of right upper limb: Secondary | ICD-10-CM | POA: Insufficient documentation

## 2014-07-09 DIAGNOSIS — L0390 Cellulitis, unspecified: Secondary | ICD-10-CM

## 2014-07-09 DIAGNOSIS — E785 Hyperlipidemia, unspecified: Secondary | ICD-10-CM | POA: Insufficient documentation

## 2014-07-09 DIAGNOSIS — L0889 Other specified local infections of the skin and subcutaneous tissue: Secondary | ICD-10-CM | POA: Diagnosis present

## 2014-07-09 MED ORDER — SULFAMETHOXAZOLE-TRIMETHOPRIM 800-160 MG PO TABS: 2.0000 | ORAL_TABLET | Freq: Two times a day (BID) | ORAL | Status: DC

## 2014-07-09 MED ORDER — NAPROXEN 500 MG PO TABS: 500.0000 mg | ORAL_TABLET | Freq: Two times a day (BID) | ORAL | Status: DC

## 2014-07-09 MED ORDER — CEPHALEXIN 500 MG PO CAPS: 500.0000 mg | ORAL_CAPSULE | Freq: Four times a day (QID) | ORAL | Status: DC

## 2014-07-09 MED ORDER — TRAMADOL HCL 50 MG PO TABS: 50.0000 mg | ORAL_TABLET | Freq: Four times a day (QID) | ORAL | Status: DC | PRN

## 2014-07-09 MED ORDER — LIDOCAINE-EPINEPHRINE (PF) 2 %-1:200000 IJ SOLN
10.0000 mL | Freq: Once | INTRAMUSCULAR | Status: AC
  Administered 2014-07-09: 10 mL
  Filled 2014-07-09: qty 20

## 2014-07-09 NOTE — ED Provider Notes (Signed)
CSN: [id number]     Arrival date & time 07/09/14  1336 History   First MD Initiated Contact with Patient 07/09/14 1535     Chief Complaint  Patient presents with  . Recurrent Skin Infections     (Consider location/radiation/quality/duration/timing/severity/associated sxs/prior Treatment) HPI Comments: Patient presents today with an abscess of his right upper arm.  He reports that the abscess has been present for the past 3 days and is gradually worsening.  No drainage.  He states that he has been using IV Heroin at the site of the abscess.  He also has a history of DM and prior abscesses.  No treatment prior to arrival.  He denies fever, chills, nausea, vomiting, numbness, or tingling.    The history is provided by the patient.    Past Medical History  Diagnosis Date  . Heroin abuse   . Diabetes mellitus without complication   . Hyperlipidemia   . Hypertension    Past Surgical History  Procedure Laterality Date  . I&d extremity Right 03/20/2013    Procedure: IRRIGATION AND DEBRIDEMENT EXTREMITY;  Surgeon: Marlowe Shores, MD;  Location: [facility] OR;  Service: Orthopedics;  Laterality: Right;   Family History  Problem Relation Age of Onset  . Diabetes Mother   . Heart disease Mother   . Hypertension Mother   . Stroke Mother   . Vision loss Mother   . Heart disease Father   . Hypertension Father    History  Substance Use Topics  . Smoking status: Current Every Day Smoker -- 1.10 packs/day for 11 years  . Smokeless tobacco: Never Used     Comment: Smoking .5-1 ppd  . Alcohol Use: 0.0 oz/week    0 Not specified per week    Review of Systems  All other systems reviewed and are negative.     Allergies  Review of patient's allergies indicates no known allergies.  Home Medications   Prior to Admission medications   Medication Sig Start Date End Date Taking? Authorizing Provider  aspirin EC 81 MG tablet Take 1 tablet (81 mg total) by mouth daily. 04/04/14   Ambrose Finland, NP  Cholecalciferol (VITAMIN D-3) 1000 UNITS CAPS Take 1 capsule by mouth daily.    Historical Provider, MD  feeding supplement, GLUCERNA SHAKE, (GLUCERNA SHAKE) LIQD Take 237 mLs by mouth 2 (two) times daily between meals. 12/06/13   Gust Rung, DO  glucose blood test strip Use as instructed 12/27/13   Ambrose Finland, NP  glucose monitoring kit (FREESTYLE) monitoring kit 1 each by Does not apply route as needed for other. 12/27/13   Ambrose Finland, NP  ibuprofen (ADVIL,MOTRIN) 600 MG tablet Take 1 tablet (600 mg total) by mouth every 8 (eight) hours as needed. 04/25/14   Ambrose Finland, NP  Insulin Glargine (TOUJEO SOLOSTAR) 300 UNIT/ML SOPN Inject 10 Units into the skin at bedtime. 04/25/14   Ambrose Finland, NP  Lancets (FREESTYLE) lancets Use as instructed 12/27/13   Ambrose Finland, NP  metFORMIN (GLUCOPHAGE) 1000 MG tablet Take 1 tablet (1,000 mg total) by mouth 2 (two) times daily with a meal. 04/04/14   Ambrose Finland, NP  Multiple Vitamin (MULTIVITAMIN WITH MINERALS) TABS tablet Take 1 tablet by mouth daily.    Historical Provider, MD  pravastatin (PRAVACHOL) 40 MG tablet Take 1 tablet (40 mg total) by mouth daily. 04/04/14   Ambrose Finland, NP   BP 119/85 mmHg  Pulse 98  Temp(Src) 97.6  F (36.4 C) (Oral)  Resp 16  Ht $R'5\' 7"'Rv$  (1.702 m)  Wt 145 lb (65.772 kg)  BMI 22.71 kg/m2  SpO2 99% Physical Exam  Constitutional: He appears well-developed and well-nourished.  HENT:  Head: Normocephalic and atraumatic.  Neck: Normal range of motion. Neck supple.  Cardiovascular: Normal rate, regular rhythm and normal heart sounds.   Pulses:      Radial pulses are 2+ on the right side, and 2+ on the left side.  Pulmonary/Chest: Effort normal and breath sounds normal.  Musculoskeletal:       Right shoulder: He exhibits normal range of motion and no swelling.       Right elbow: He exhibits normal range of motion and no swelling.  Neurological: He is alert.  Distal sensation of right hand intact    Skin: Skin is warm and dry.     Psychiatric: He has a normal mood and affect.  Nursing note and vitals reviewed.   ED Course  Procedures (including critical care time) Labs Review Labs Reviewed - No data to display  Imaging Review No results found.   EKG Interpretation None     INCISION AND DRAINAGE Performed by: Hyman Bible Consent: Verbal consent obtained. Risks and benefits: risks, benefits and alternatives were discussed Type: abscess  Body area: right upper arm  Anesthesia: local infiltration  Incision was made with a scalpel.  Local anesthetic: lidocaine 2% with epinephrine  Anesthetic total: 4 ml  Complexity: complex Blunt dissection to break up loculations  Drainage: purulent  Drainage amount: copious  Patient tolerance: Patient tolerated the procedure well with no immediate complications.    MDM   Final diagnoses:  None   Patient presents with an abscess of his right upper arm.  Abscess with mild surrounding cellulitis.  Patient is afebrile in the ED.  Abscess incised and drained with good results.  Patient does have a history of DM.  Patient started on antibiotics and instructed to follow up in 48 hours for wound check.  He is stable for discharge.  Return precautions given.    Hyman Bible, PA-C 07/09/14 1740  Veryl Speak, MD 07/10/14 1001

## 2014-07-09 NOTE — ED Notes (Signed)
Pt sates that she had an abcess that appeared on Friday. Pt noted to have large abscess to rt upper arm. Pt with redness extending down arm.

## 2014-07-19 ENCOUNTER — Other Ambulatory Visit: Payer: Self-pay | Admitting: *Deleted

## 2014-07-19 ENCOUNTER — Ambulatory Visit: Payer: Non-veteran care | Attending: Internal Medicine

## 2014-07-19 DIAGNOSIS — T148 Other injury of unspecified body region: Secondary | ICD-10-CM | POA: Insufficient documentation

## 2014-07-19 DIAGNOSIS — S41101A Unspecified open wound of right upper arm, initial encounter: Secondary | ICD-10-CM | POA: Insufficient documentation

## 2014-07-19 MED ORDER — INSULIN GLARGINE 300 UNIT/ML ~~LOC~~ SOPN: 10.0000 [IU] | PEN_INJECTOR | Freq: Every day | SUBCUTANEOUS | Status: DC

## 2014-07-19 NOTE — Progress Notes (Signed)
Pt in our clinic for wound check and dressing change Wound on rt upper arm. Healing good without infection noticed Pt advised to maintain area clean and to continue taking antibiotic as prescribed

## 2014-07-26 ENCOUNTER — Ambulatory Visit: Payer: Non-veteran care | Attending: Internal Medicine | Admitting: Internal Medicine

## 2014-07-26 ENCOUNTER — Encounter: Payer: Self-pay | Admitting: Internal Medicine

## 2014-07-26 VITALS — BP 110/75 | HR 86 | Temp 98.8°F | Resp 16 | Ht 67.0 in | Wt 124.0 lb

## 2014-07-26 DIAGNOSIS — M25572 Pain in left ankle and joints of left foot: Secondary | ICD-10-CM

## 2014-07-26 DIAGNOSIS — F1110 Opioid abuse, uncomplicated: Secondary | ICD-10-CM | POA: Insufficient documentation

## 2014-07-26 DIAGNOSIS — F1010 Alcohol abuse, uncomplicated: Secondary | ICD-10-CM | POA: Insufficient documentation

## 2014-07-26 DIAGNOSIS — Z7982 Long term (current) use of aspirin: Secondary | ICD-10-CM | POA: Insufficient documentation

## 2014-07-26 DIAGNOSIS — Z720 Tobacco use: Secondary | ICD-10-CM

## 2014-07-26 DIAGNOSIS — E785 Hyperlipidemia, unspecified: Secondary | ICD-10-CM

## 2014-07-26 DIAGNOSIS — F17210 Nicotine dependence, cigarettes, uncomplicated: Secondary | ICD-10-CM | POA: Insufficient documentation

## 2014-07-26 DIAGNOSIS — I10 Essential (primary) hypertension: Secondary | ICD-10-CM | POA: Insufficient documentation

## 2014-07-26 DIAGNOSIS — E119 Type 2 diabetes mellitus without complications: Secondary | ICD-10-CM

## 2014-07-26 DIAGNOSIS — Z794 Long term (current) use of insulin: Secondary | ICD-10-CM | POA: Insufficient documentation

## 2014-07-26 LAB — URIC ACID: Uric Acid, Serum: 3.9 mg/dL — ABNORMAL LOW (ref 4.0–7.8)

## 2014-07-26 LAB — GLUCOSE, POCT (MANUAL RESULT ENTRY)
POC Glucose: 288 mg/dl — AB (ref 70–99)
POC Glucose: 268 mg/dl — AB (ref 70–99)

## 2014-07-26 LAB — POCT GLYCOSYLATED HEMOGLOBIN (HGB A1C): Hemoglobin A1C: >15

## 2014-07-26 MED ORDER — INDOMETHACIN 25 MG PO CAPS: 25.0000 mg | ORAL_CAPSULE | Freq: Two times a day (BID) | ORAL | Status: DC

## 2014-07-26 MED ORDER — INSULIN GLARGINE 300 UNIT/ML ~~LOC~~ SOPN: 10.0000 [IU] | PEN_INJECTOR | Freq: Every day | SUBCUTANEOUS | Status: DC

## 2014-07-26 MED ORDER — METFORMIN HCL 1000 MG PO TABS: 1000.0000 mg | ORAL_TABLET | Freq: Two times a day (BID) | ORAL | Status: DC

## 2014-07-26 MED ORDER — PRAVASTATIN SODIUM 20 MG PO TABS: 20.0000 mg | ORAL_TABLET | Freq: Every day | ORAL | Status: DC

## 2014-07-26 NOTE — Patient Instructions (Signed)
Please begin taking Metformin twice daily, toujeo once per night. Please check sugars.

## 2014-07-26 NOTE — Progress Notes (Signed)
Patient ID: Raymond Winters, male   DOB: 1960-12-01, 54 y.o.   MRN: [id number]  CC: DM f/u  HPI: Raymond Winters is a 54 y.o. male here today for a follow up visit.  Patient has past medical history of HTN, HLD, T2DM, and heroin/alcohol abuse.  Patient reports that he has been in several situations since our last visit that has prevented him from coming for a follow up visit.  Patient has been incarcerated several times and has had his diabetes management changed in the process.  He reports that he has been out of insulin for several months.   He has lost a total of 20 pounds in less than one month.  He denies drug use in 2 weeks. He has a decreased appetite. He reports around 2-3 hours of sleep per night. He denies depression or excessive stress.  He is concerned about painful left foot with swelling for the past 2 days.  He denies injury  Patient has No headache, No chest pain, No abdominal pain - No Nausea, No new weakness tingling or numbness, No Cough - SOB.  No Known Allergies Past Medical History  Diagnosis Date  . Heroin abuse   . Diabetes mellitus without complication   . Hyperlipidemia   . Hypertension    Current Outpatient Prescriptions on File Prior to Visit  Medication Sig Dispense Refill  . aspirin EC 81 MG tablet Take 1 tablet (81 mg total) by mouth daily. 30 tablet 5  . glucose blood test strip Use as instructed 100 each 12  . glucose monitoring kit (FREESTYLE) monitoring kit 1 each by Does not apply route as needed for other. 1 each 0  . Insulin Glargine (TOUJEO SOLOSTAR) 300 UNIT/ML SOPN Inject 10 Units into the skin at bedtime. 1 pen 0  . Lancets (FREESTYLE) lancets Use as instructed 100 each 12  . metFORMIN (GLUCOPHAGE) 1000 MG tablet Take 1 tablet (1,000 mg total) by mouth 2 (two) times daily with a meal. 60 tablet 4  . cephALEXin (KEFLEX) 500 MG capsule Take 1 capsule (500 mg total) by mouth 4 (four) times daily. (Patient not taking: Reported on 07/26/2014) 40 capsule 0  . cephALEXin  (KEFLEX) 500 MG capsule Take 1 capsule (500 mg total) by mouth 4 (four) times daily. (Patient not taking: Reported on 07/26/2014) 40 capsule 0  . Cholecalciferol (VITAMIN D-3) 1000 UNITS CAPS Take 1 capsule by mouth daily.    . feeding supplement, GLUCERNA SHAKE, (GLUCERNA SHAKE) LIQD Take 237 mLs by mouth 2 (two) times daily between meals. (Patient not taking: Reported on 07/26/2014)  0  . ibuprofen (ADVIL,MOTRIN) 600 MG tablet Take 1 tablet (600 mg total) by mouth every 8 (eight) hours as needed. (Patient not taking: Reported on 07/26/2014) 30 tablet 0  . Multiple Vitamin (MULTIVITAMIN WITH MINERALS) TABS tablet Take 1 tablet by mouth daily.    . naproxen (NAPROSYN) 500 MG tablet Take 1 tablet (500 mg total) by mouth 2 (two) times daily. (Patient not taking: Reported on 07/26/2014) 30 tablet 0  . naproxen (NAPROSYN) 500 MG tablet Take 1 tablet (500 mg total) by mouth 2 (two) times daily. (Patient not taking: Reported on 07/26/2014) 30 tablet 0  . pravastatin (PRAVACHOL) 40 MG tablet Take 1 tablet (40 mg total) by mouth daily. (Patient not taking: Reported on 07/26/2014) 30 tablet 4  . sulfamethoxazole-trimethoprim (SEPTRA DS) 800-160 MG per tablet Take 2 tablets by mouth 2 (two) times daily. (Patient not taking: Reported on 07/26/2014) 40 tablet 0  .  sulfamethoxazole-trimethoprim (SEPTRA DS) 800-160 MG per tablet Take 2 tablets by mouth 2 (two) times daily. (Patient not taking: Reported on 07/26/2014) 40 tablet 0   No current facility-administered medications on file prior to visit.   Family History  Problem Relation Age of Onset  . Diabetes Mother   . Heart disease Mother   . Hypertension Mother   . Stroke Mother   . Vision loss Mother   . Heart disease Father   . Hypertension Father    History   Social History  . Marital Status: Single    Spouse Name: N/A    Number of Children: N/A  . Years of Education: N/A   Occupational History  . Not on file.   Social History Main Topics  . Smoking status:  Current Every Day Smoker -- 1.10 packs/day for 11 years  . Smokeless tobacco: Never Used     Comment: Smoking .5-1 ppd  . Alcohol Use: 0.0 oz/week    0 Not specified per week  . Drug Use: Yes    Special: IV     Comment: heroin  . Sexual Activity: Not on file   Other Topics Concern  . Not on file   Social History Narrative    Review of Systems: See HPI.         Objective:   Filed Vitals:   07/26/14 1159  BP: 110/75  Pulse: 86  Temp: 98.8 F (37.1 C)  Resp: 16    Physical Exam  Constitutional: He is oriented to person, place, and time.  Cardiovascular: Normal rate, regular rhythm and normal heart sounds.   Pulses:      Dorsalis pedis pulses are 2+ on the right side, and 2+ on the left side.  Pulmonary/Chest: Effort normal and breath sounds normal.  Musculoskeletal: Normal range of motion. He exhibits edema and tenderness (left ankle).  Feet:  Right Foot:  Skin Integrity: Negative for skin breakdown.  Left Foot:  Skin Integrity: Negative for skin breakdown.  Neurological: He is alert and oriented to person, place, and time.  Skin: Skin is warm and dry.     Lab Results  Component Value Date   WBC 3.7* 01/03/2014   HGB 11.4* 01/03/2014   HCT 34.1* 01/03/2014   MCV 82.2 01/03/2014   PLT 185 01/03/2014   Lab Results  Component Value Date   CREATININE 0.86 01/03/2014   BUN 12 01/03/2014   NA 140 01/03/2014   K 5.3 01/03/2014   CL 104 01/03/2014   CO2 27 01/03/2014    Lab Results  Component Value Date   HGBA1C 7.9 12/27/2013   Lipid Panel     Component Value Date/Time   CHOL 86 01/03/2014 0948   TRIG 149 01/03/2014 0948   HDL 40 01/03/2014 0948   CHOLHDL 2.2 01/03/2014 0948   VLDL 30 01/03/2014 0948   LDLCALC 16 01/03/2014 0948       Assessment and plan:   Raymond Winters was seen today for follow-up.  Diagnoses and associated orders for this visit:  Type 2 diabetes mellitus without complication - Glucose (CBG) - HgB A1c - metFORMIN (GLUCOPHAGE)  1000 MG tablet; Take 1 tablet (1,000 mg total) by mouth 2 (two) times daily with a meal. - Insulin Glargine (TOUJEO SOLOSTAR) 300 UNIT/ML SOPN; Inject 10 Units into the skin at bedtime. - Glucose (CBG)  HLD (hyperlipidemia) - pravastatin (PRAVACHOL) 20 MG tablet; Take 1 tablet (20 mg total) by mouth daily.  Pain in joint, ankle and foot, left -  Uric Acid - indomethacin (INDOCIN) 25 MG capsule; Take 1 capsule (25 mg total) by mouth 2 (two) times daily with a meal.  Tobacco abuse Smoking cessation discussed for 3 minutes, patient is not willing to quit at this time. Will continue to assess on each visit. Discussed increased risk for diseases such as cancer, heart disease, and stroke. '   Return in about 3 months (around 10/24/2014).       Chari Manning, NP-C [facility] [phone] 07/26/2014, 12:22 PM

## 2014-07-26 NOTE — Progress Notes (Signed)
Pt is here following up on his diabetes. Pt reports that his left ankle is swollen and has been painful for 2 days.

## 2014-07-27 ENCOUNTER — Telehealth: Payer: Self-pay | Admitting: *Deleted

## 2014-07-27 NOTE — Telephone Encounter (Signed)
-----   Message from Ambrose FinlandValerie A Keck, NP sent at 07/27/2014  1:03 PM EST ----- Does NOT look like gout. Maybe the swelling is just due to an injury or something else. Take the indomethicin to help with pain

## 2014-09-19 ENCOUNTER — Telehealth: Payer: Self-pay | Admitting: General Practice

## 2014-09-19 NOTE — Telephone Encounter (Signed)
Patient calling to request medication refill for his insulin and strips. Patient is currently out.  Please assist

## 2014-10-04 ENCOUNTER — Telehealth: Payer: Self-pay | Admitting: Internal Medicine

## 2014-10-04 NOTE — Telephone Encounter (Signed)
[facility] needs more information about pt for pt to obtain insulin.   [facility] f[facility], [phone]:[phone].

## 2014-10-17 ENCOUNTER — Ambulatory Visit: Payer: Self-pay | Attending: Internal Medicine | Admitting: Internal Medicine

## 2014-10-17 ENCOUNTER — Encounter: Payer: Self-pay | Admitting: Internal Medicine

## 2014-10-17 VITALS — BP 106/72 | HR 112 | Temp 98.0°F | Resp 16 | Ht 67.0 in | Wt 112.0 lb

## 2014-10-17 DIAGNOSIS — F32A Depression, unspecified: Secondary | ICD-10-CM

## 2014-10-17 DIAGNOSIS — E119 Type 2 diabetes mellitus without complications: Secondary | ICD-10-CM

## 2014-10-17 DIAGNOSIS — R634 Abnormal weight loss: Secondary | ICD-10-CM

## 2014-10-17 DIAGNOSIS — R369 Urethral discharge, unspecified: Secondary | ICD-10-CM

## 2014-10-17 DIAGNOSIS — Z794 Long term (current) use of insulin: Secondary | ICD-10-CM | POA: Insufficient documentation

## 2014-10-17 DIAGNOSIS — F329 Major depressive disorder, single episode, unspecified: Secondary | ICD-10-CM

## 2014-10-17 DIAGNOSIS — R739 Hyperglycemia, unspecified: Secondary | ICD-10-CM

## 2014-10-17 LAB — COMPLETE METABOLIC PANEL WITH GFR
ALK PHOS: 82 U/L (ref 39–117)
ALT: 45 U/L (ref 0–53)
AST: 44 U/L — AB (ref 0–37)
Albumin: 3.2 g/dL — ABNORMAL LOW (ref 3.5–5.2)
BUN: 8 mg/dL (ref 6–23)
CO2: 23 mEq/L (ref 19–32)
CREATININE: 0.8 mg/dL (ref 0.50–1.35)
Calcium: 8.6 mg/dL (ref 8.4–10.5)
Chloride: 97 mEq/L (ref 96–112)
GFR, Est African American: >89 mL/min
GFR, Est Non African American: >89 mL/min
Glucose, Bld: 462 mg/dL — ABNORMAL HIGH (ref 70–99)
Potassium: 4.3 mEq/L (ref 3.5–5.3)
SODIUM: 131 meq/L — AB (ref 135–145)
Total Bilirubin: 0.4 mg/dL (ref 0.2–1.2)
Total Protein: 6.6 g/dL (ref 6.0–8.3)

## 2014-10-17 LAB — CBC WITH DIFFERENTIAL/PLATELET
BASOS ABS: 0.1 10*3/uL (ref 0.0–0.1)
BASOS PCT: 2 % — AB (ref 0–1)
EOS PCT: 1 % (ref 0–5)
Eosinophils Absolute: 0 10*3/uL (ref 0.0–0.7)
HCT: 34.4 % — ABNORMAL LOW (ref 39.0–52.0)
Hemoglobin: 11.1 g/dL — ABNORMAL LOW (ref 13.0–17.0)
Lymphocytes Relative: 41 % (ref 12–46)
Lymphs Abs: 1 10*3/uL (ref 0.7–4.0)
MCH: 27.7 pg (ref 26.0–34.0)
MCHC: 32.3 g/dL (ref 30.0–36.0)
MCV: 85.8 fL (ref 78.0–100.0)
MONO ABS: 0.3 10*3/uL (ref 0.1–1.0)
MPV: 10.9 fL (ref 8.6–12.4)
Monocytes Relative: 10 % (ref 3–12)
Neutro Abs: 1.2 10*3/uL — ABNORMAL LOW (ref 1.7–7.7)
Neutrophils Relative %: 46 % (ref 43–77)
PLATELETS: 195 10*3/uL (ref 150–400)
RBC: 4.01 MIL/uL — ABNORMAL LOW (ref 4.22–5.81)
RDW: 14 % (ref 11.5–15.5)
WBC: 2.5 10*3/uL — ABNORMAL LOW (ref 4.0–10.5)

## 2014-10-17 LAB — POCT URINALYSIS DIPSTICK
BILIRUBIN UA: NEGATIVE
Blood, UA: NEGATIVE
GLUCOSE UA: 500
KETONES UA: NEGATIVE
LEUKOCYTES UA: NEGATIVE
NITRITE UA: NEGATIVE
Protein, UA: NEGATIVE
SPEC GRAV UA: 1.01
UROBILINOGEN UA: 0.2
pH, UA: 6

## 2014-10-17 LAB — GLUCOSE, POCT (MANUAL RESULT ENTRY)
POC GLUCOSE: 560 mg/dL — AB (ref 70–99)
POC GLUCOSE: 312 mg/dL — AB (ref 70–99)

## 2014-10-17 MED ORDER — CEFTRIAXONE SODIUM 1 G IJ SOLR: 250.0000 mg | INTRAMUSCULAR | Status: DC

## 2014-10-17 MED ORDER — MIRTAZAPINE 15 MG PO TABS
15.0000 mg | ORAL_TABLET | Freq: Every day | ORAL | Status: DC
Start: 2014-10-17 — End: 2015-05-10

## 2014-10-17 MED ORDER — INSULIN ASPART 100 UNIT/ML ~~LOC~~ SOLN
20.0000 [IU] | Freq: Once | SUBCUTANEOUS | Status: AC
  Administered 2014-10-17: 20 [IU] via SUBCUTANEOUS

## 2014-10-17 MED ORDER — AZITHROMYCIN 250 MG PO TABS: 1000.0000 mg | ORAL_TABLET | Freq: Once | ORAL | Status: DC

## 2014-10-17 MED ORDER — INSULIN GLARGINE 100 UNIT/ML SOLOSTAR PEN: 20.0000 [IU] | PEN_INJECTOR | Freq: Every day | SUBCUTANEOUS | Status: DC

## 2014-10-17 NOTE — Progress Notes (Signed)
Pt is here following up on his HTN, hyperlipidemia and his diabetes. Pt states that he has neuropathy in his feet. Pt wants some medication for depression. Pt states that he is having a discharge coming from his penis. Pt is needing a drug rehab referral.

## 2014-10-17 NOTE — Progress Notes (Signed)
Patient ID: Raymond Winters, male   DOB: 1961/01/12, 54 y.o.   MRN: [id number]  CC: T2DM f/u, depression, penile dischare  HPI: Raymond Winters is a 54 y.o. male here today for a follow up visit.  Patient has past medical history of T2DM, Heroin abuse, HTN, HLD.  Patient presents today with reports of being out of his insulin for one month due to not being able to get medication from [place]. He reports that he has also tried several other pharmacies to get his medication from but has been unable. He c/o of persistent neuropathy in his feet.   Patient reports that he has not been sexually active in several weeks but reports a discharge from his penis. The discharge is white and malodorous with no dysuria. He is unsure if he has been exposed to any STD's in the past.  He is interested in medication for depression and a drug rehab referral. Patient reports that he feels depressed more often and feels very agitated easily. He has been unable to pick up weight in several months and has tried Glucerna shakes to supplement meals. He does admit to decreased appetite.  Patient has No headache, No chest pain, No abdominal pain - No Nausea, No Cough - SOB.  No Known Allergies Past Medical History  Diagnosis Date  . Heroin abuse   . Diabetes mellitus without complication   . Hyperlipidemia   . Hypertension    Current Outpatient Prescriptions on File Prior to Visit  Medication Sig Dispense Refill  . aspirin EC 81 MG tablet Take 1 tablet (81 mg total) by mouth daily. 30 tablet 5  . Cholecalciferol (VITAMIN D-3) 1000 UNITS CAPS Take 1 capsule by mouth daily.    . feeding supplement, GLUCERNA SHAKE, (GLUCERNA SHAKE) LIQD Take 237 mLs by mouth 2 (two) times daily between meals.  0  . glucose blood test strip Use as instructed 100 each 12  . glucose monitoring kit (FREESTYLE) monitoring kit 1 each by Does not apply route as needed for other. 1 each 0  . indomethacin (INDOCIN) 25 MG capsule Take 1 capsule (25 mg total)  by mouth 2 (two) times daily with a meal. 30 capsule 0  . Lancets (FREESTYLE) lancets Use as instructed 100 each 12  . metFORMIN (GLUCOPHAGE) 1000 MG tablet Take 1 tablet (1,000 mg total) by mouth 2 (two) times daily with a meal. 60 tablet 4  . Multiple Vitamin (MULTIVITAMIN WITH MINERALS) TABS tablet Take 1 tablet by mouth daily.    . pravastatin (PRAVACHOL) 20 MG tablet Take 1 tablet (20 mg total) by mouth daily. 30 tablet 4   No current facility-administered medications on file prior to visit.   Family History  Problem Relation Age of Onset  . Diabetes Mother   . Heart disease Mother   . Hypertension Mother   . Stroke Mother   . Vision loss Mother   . Heart disease Father   . Hypertension Father    History   Social History  . Marital Status: Single    Spouse Name: N/A  . Number of Children: N/A  . Years of Education: N/A   Occupational History  . Not on file.   Social History Main Topics  . Smoking status: Current Every Day Smoker -- 1.10 packs/day for 11 years  . Smokeless tobacco: Never Used     Comment: Smoking .5-1 ppd  . Alcohol Use: 0.0 oz/week    0 Standard drinks or equivalent per week  .  Drug Use: Yes    Special: IV     Comment: heroin  . Sexual Activity: Not on file   Other Topics Concern  . Not on file   Social History Narrative    Review of Systems  Eyes: Positive for blurred vision.  Genitourinary: Negative.        Penile discharge   Neurological: Positive for dizziness and tingling.  Endo/Heme/Allergies: Positive for polydipsia.  Psychiatric/Behavioral: Positive for depression and substance abuse. Negative for suicidal ideas and hallucinations. The patient is not nervous/anxious.   All other systems reviewed and are negative.   Objective:   Filed Vitals:   10/17/14 1214  BP: 106/72  Pulse: 112  Temp: 98 F (36.7 C)  Resp: 16    Physical Exam  Cardiovascular: Normal rate, regular rhythm and normal heart sounds.   Pulmonary/Chest:  Effort normal and breath sounds normal.  Abdominal: Soft. Bowel sounds are normal.  Musculoskeletal: Normal range of motion.  Neurological: He is alert.  Skin: Skin is warm and dry.  Psychiatric:  Patient seems very agitated with sisters attempting to help with his care      Lab Results  Component Value Date   WBC 3.7* 01/03/2014   HGB 11.4* 01/03/2014   HCT 34.1* 01/03/2014   MCV 82.2 01/03/2014   PLT 185 01/03/2014   Lab Results  Component Value Date   CREATININE 0.86 01/03/2014   BUN 12 01/03/2014   NA 140 01/03/2014   K 5.3 01/03/2014   CL 104 01/03/2014   CO2 27 01/03/2014    Lab Results  Component Value Date   HGBA1C >15.0 07/26/2014   Lipid Panel     Component Value Date/Time   CHOL 86 01/03/2014 0948   TRIG 149 01/03/2014 0948   HDL 40 01/03/2014 0948   CHOLHDL 2.2 01/03/2014 0948   VLDL 30 01/03/2014 0948   LDLCALC 16 01/03/2014 0948       Assessment and plan:   Raymond Winters was seen today for follow-up.  Diagnoses and all orders for this visit:  Type 2 diabetes mellitus without complication Orders: -     Glucose (CBG) -     Urinalysis Dipstick -    Increased to Insulin Glargine (LANTUS SOLOSTAR) 100 UNIT/ML Solostar Pen; Inject 20 Units into the skin daily at 10 pm. -     COMPLETE METABOLIC PANEL WITH GFR -     CBC with Differential -     Glucose (CBG) Patient has been out of insulin for one month will now increase insulin to 20 units at bedtime and recheck in 2 weeks. I will also keep patient on Metformin same dose. Will look at kidney function today.  Hyperglycemia Orders: -     0.9 %  sodium chloride infusion; Inject into the vein once. -     Insert peripheral IV -     insulin aspart (novoLOG) injection 20 Units; Inject 0.2 mLs (20 Units total) into the skin once. Patient given 1L NS in conjunction with insulin to help lower severely high blood sugar.  Loss of weight Orders: -     mirtazapine (REMERON) 15 MG tablet; Take 1 tablet (15 mg  total) by mouth at bedtime. Weight issues likely d/t to decreased appetite from depression. Remeron will help with appetite and weight gain.  Depression Orders: -     mirtazapine (REMERON) 15 MG tablet; Take 1 tablet (15 mg total) by mouth at bedtime. I have addressed depression and given patient resources to [place],  [facility], and I have called [facility] to ask about admission process. Discussed with patient that he should have a medical detox at local ER and then ask for a direct admit to [facility]. I have allowed patient to ask questions and encouraged him. He seemed very interested in seeking help for substance abuse and depression. I started him on remeron in the event that he delays seeking care with psychiatry.  Penile discharge Orders: -     Urine cytology ancillary only -     cefTRIAXone (ROCEPHIN) injection 250 mg; Inject 0.25 g (250 mg total) into the muscle daily. -     azithromycin (ZITHROMAX) tablet 1,000 mg; Take 4 tablets (1,000 mg total) by mouth once. Will treat for STD's while patient is here. Very likely that I will be unable to get in contact with patient once he leaves office today.   Return in about 6 weeks (around 11/28/2014) for depression f/u . I will also address medication changes for DM at that time.       Chari Manning, [place] [facility] [phone] 10/17/2014, 1:24 PM

## 2014-10-18 LAB — URINE CYTOLOGY ANCILLARY ONLY
Chlamydia: NEGATIVE
Neisseria Gonorrhea: NEGATIVE
TRICH (WINDOWPATH): NEGATIVE

## 2014-10-21 MED ORDER — SODIUM CHLORIDE 0.9 % IV SOLN: Freq: Once | INTRAVENOUS | Status: DC

## 2015-05-10 ENCOUNTER — Ambulatory Visit: Payer: Self-pay | Attending: Internal Medicine | Admitting: Internal Medicine

## 2015-05-10 ENCOUNTER — Encounter: Payer: Self-pay | Admitting: Internal Medicine

## 2015-05-10 VITALS — BP 129/87 | HR 90 | Temp 98.0°F | Resp 16 | Ht 67.0 in | Wt 140.0 lb

## 2015-05-10 DIAGNOSIS — Z7984 Long term (current) use of oral hypoglycemic drugs: Secondary | ICD-10-CM | POA: Insufficient documentation

## 2015-05-10 DIAGNOSIS — Z794 Long term (current) use of insulin: Secondary | ICD-10-CM | POA: Insufficient documentation

## 2015-05-10 DIAGNOSIS — M25512 Pain in left shoulder: Secondary | ICD-10-CM

## 2015-05-10 DIAGNOSIS — M25511 Pain in right shoulder: Secondary | ICD-10-CM

## 2015-05-10 DIAGNOSIS — R634 Abnormal weight loss: Secondary | ICD-10-CM

## 2015-05-10 DIAGNOSIS — Z79899 Other long term (current) drug therapy: Secondary | ICD-10-CM | POA: Insufficient documentation

## 2015-05-10 DIAGNOSIS — E1142 Type 2 diabetes mellitus with diabetic polyneuropathy: Secondary | ICD-10-CM | POA: Insufficient documentation

## 2015-05-10 DIAGNOSIS — Z9114 Patient's other noncompliance with medication regimen: Secondary | ICD-10-CM | POA: Insufficient documentation

## 2015-05-10 DIAGNOSIS — E119 Type 2 diabetes mellitus without complications: Secondary | ICD-10-CM

## 2015-05-10 DIAGNOSIS — Z7982 Long term (current) use of aspirin: Secondary | ICD-10-CM | POA: Insufficient documentation

## 2015-05-10 DIAGNOSIS — E785 Hyperlipidemia, unspecified: Secondary | ICD-10-CM | POA: Insufficient documentation

## 2015-05-10 DIAGNOSIS — F32A Depression, unspecified: Secondary | ICD-10-CM

## 2015-05-10 DIAGNOSIS — F329 Major depressive disorder, single episode, unspecified: Secondary | ICD-10-CM | POA: Insufficient documentation

## 2015-05-10 LAB — BASIC METABOLIC PANEL
BUN: 11 mg/dL (ref 7–25)
CO2: 26 mmol/L (ref 20–31)
Calcium: 9.5 mg/dL (ref 8.6–10.3)
Chloride: 99 mmol/L (ref 98–110)
Creat: 0.93 mg/dL (ref 0.70–1.33)
GLUCOSE: 297 mg/dL — AB (ref 65–99)
Potassium: 4.7 mmol/L (ref 3.5–5.3)
SODIUM: 136 mmol/L (ref 135–146)

## 2015-05-10 LAB — POCT URINALYSIS DIPSTICK
Bilirubin, UA: NEGATIVE
GLUCOSE UA: 500
Ketones, UA: NEGATIVE
Leukocytes, UA: NEGATIVE
NITRITE UA: NEGATIVE
Protein, UA: NEGATIVE
RBC UA: NEGATIVE
Spec Grav, UA: 1.015
Urobilinogen, UA: 0.2
pH, UA: 5.5

## 2015-05-10 LAB — POCT GLYCOSYLATED HEMOGLOBIN (HGB A1C): HEMOGLOBIN A1C: 10.2

## 2015-05-10 LAB — GLUCOSE, POCT (MANUAL RESULT ENTRY): POC Glucose: 356 mg/dl — AB (ref 70–99)

## 2015-05-10 MED ORDER — GABAPENTIN 300 MG PO CAPS: 300.0000 mg | ORAL_CAPSULE | Freq: Every day | ORAL | Status: DC

## 2015-05-10 MED ORDER — MIRTAZAPINE 15 MG PO TABS
15.0000 mg | ORAL_TABLET | Freq: Every day | ORAL | Status: DC
Start: 2015-05-10 — End: 2015-09-27

## 2015-05-10 MED ORDER — METFORMIN HCL 1000 MG PO TABS: 1000.0000 mg | ORAL_TABLET | Freq: Two times a day (BID) | ORAL | Status: DC

## 2015-05-10 MED ORDER — INSULIN ASPART 100 UNIT/ML ~~LOC~~ SOLN
20.0000 [IU] | Freq: Once | SUBCUTANEOUS | Status: AC
  Administered 2015-05-10: 20 [IU] via SUBCUTANEOUS

## 2015-05-10 MED ORDER — TRAMADOL HCL 50 MG PO TABS: 50.0000 mg | ORAL_TABLET | Freq: Two times a day (BID) | ORAL | Status: DC | PRN

## 2015-05-10 MED ORDER — GLUCOSE BLOOD VI STRP: ORAL_STRIP | Status: DC

## 2015-05-10 MED ORDER — TRUEPLUS LANCETS 28G MISC: Status: DC

## 2015-05-10 MED ORDER — PRAVASTATIN SODIUM 20 MG PO TABS: 20.0000 mg | ORAL_TABLET | Freq: Every day | ORAL | Status: DC

## 2015-05-10 MED ORDER — INSULIN GLARGINE 100 UNIT/ML SOLOSTAR PEN: 20.0000 [IU] | PEN_INJECTOR | Freq: Every day | SUBCUTANEOUS | Status: DC

## 2015-05-10 NOTE — Progress Notes (Signed)
Patient ID: Raymond Winters, male   DOB: 1960/08/16, 54 y.o.   MRN: [id number] SUBJECTIVE: 54 y.o. male for follow up of diabetes. Diabetic Review of Systems - medication compliance: noncompliant much of the time, diabetic diet compliance: noncompliant much of the time, home glucose monitoring: is not performed.  Other symptoms and concerns: He states that he has been out of his Metformin and Insulin for almost 2 months. He now reports that he has been having numbness in his left index and thump fingers. Numbness first noticed about 2-3 weeks ago. He states that he is still having pain in his right shoulder but now it has moved to the left shoulder. He had a right shoulder xray that revealed degenerative changes.  Patient states that since taking Remeron he has better mood and has picked up weight. He would liek to continue medication.  Current Outpatient Prescriptions  Medication Sig Dispense Refill  . aspirin EC 81 MG tablet Take 1 tablet (81 mg total) by mouth daily. 30 tablet 5  . indomethacin (INDOCIN) 25 MG capsule Take 1 capsule (25 mg total) by mouth 2 (two) times daily with a meal. 30 capsule 0  . Insulin Glargine (LANTUS SOLOSTAR) 100 UNIT/ML Solostar Pen Inject 20 Units into the skin daily at 10 pm. 5 pen 10  . metFORMIN (GLUCOPHAGE) 1000 MG tablet Take 1 tablet (1,000 mg total) by mouth 2 (two) times daily with a meal. 60 tablet 4  . mirtazapine (REMERON) 15 MG tablet Take 1 tablet (15 mg total) by mouth at bedtime. 30 tablet 2  . pravastatin (PRAVACHOL) 20 MG tablet Take 1 tablet (20 mg total) by mouth daily. 30 tablet 4  . Cholecalciferol (VITAMIN D-3) 1000 UNITS CAPS Take 1 capsule by mouth daily.    . feeding supplement, GLUCERNA SHAKE, (GLUCERNA SHAKE) LIQD Take 237 mLs by mouth 2 (two) times daily between meals.  0  . glucose blood test strip Use as instructed 100 each 12  . glucose monitoring kit (FREESTYLE) monitoring kit 1 each by Does not apply route as needed for other. 1 each 0  .  Lancets (FREESTYLE) lancets Use as instructed 100 each 12  . Multiple Vitamin (MULTIVITAMIN WITH MINERALS) TABS tablet Take 1 tablet by mouth daily.     Current Facility-Administered Medications  Medication Dose Route Frequency Provider Last Rate Last Dose  . 0.9 %  sodium chloride infusion   Intravenous Once Lance Bosch, NP      . azithromycin (ZITHROMAX) tablet 1,000 mg  1,000 mg Oral Once Lance Bosch, NP      . cefTRIAXone (ROCEPHIN) injection 250 mg  250 mg Intramuscular Q24H Lance Bosch, NP      Review of Systems  Constitutional: Positive for weight loss.  Eyes: Positive for blurred vision.  Cardiovascular: Negative for chest pain.  Genitourinary: Positive for frequency.  Musculoskeletal: Positive for joint pain (bilateral shoulders).  Neurological: Positive for tingling (hands and feet) and headaches.  Endo/Heme/Allergies: Negative for polydipsia.  Psychiatric/Behavioral: Positive for substance abuse.  All other systems reviewed and are negative.   OBJECTIVE: Appearance: alert, well appearing, and in no distress, oriented to person, place, and time and underweight. BP 129/87 mmHg  Pulse 90  Temp(Src) 98 F (36.7 C)  Resp 16  Ht _0  (1.702 m)  Wt 140 lb (63.504 kg)  BMI 21.92 kg/m2  SpO2 100%  Physical Exam  Constitutional: He is oriented to person, place, and time.  Neck: No thyromegaly present.  Cardiovascular:  Normal rate, regular rhythm and normal heart sounds.   Pulmonary/Chest: Effort normal and breath sounds normal.  Musculoskeletal: Normal range of motion. He exhibits no edema.  Neurological: He is alert and oriented to person, place, and time.  Skin: Skin is warm and dry.    ASSESSMENT: Castin was seen today for follow-up.  Diagnoses and all orders for this visit:  Type 2 diabetes mellitus without complication, with long-term current use of insulin (HCC) -     Glucose (CBG) -     HgB A1c -     insulin aspart (novoLOG) injection 20 Units; Inject  0.2 mLs (20 Units total) into the skin once. -     Urinalysis Dipstick -     Insulin Glargine (LANTUS SOLOSTAR) 100 UNIT/ML Solostar Pen; Inject 20 Units into the skin daily at 10 pm. -     metFORMIN (GLUCOPHAGE) 1000 MG tablet; Take 1 tablet (1,000 mg total) by mouth 2 (two) times daily with a meal. -     Basic Metabolic Panel -     glucose blood (TRUE METRIX BLOOD GLUCOSE TEST) test strip; Check sugars 3 times per day -     TRUEPLUS LANCETS 28G MISC; Check sugars 3 times per day Patients diabetes remains uncontrolled as evidence by hemoglobin a1c >8.  Patient has been non-compliant with medication regimen and follow up appointments. Stressed the multiple complications associated with uncontrolled diabetes.  Patient will stay on current medication dose and report back to clinic with cbg log in 2 weeks.   Diabetic polyneuropathy associated with type 2 diabetes mellitus (HCC) -   Begin  gabapentin (NEURONTIN) 300 MG capsule; Take 1 capsule (300 mg total) by mouth at bedtime. For diabetic nerve pain Will place him on low dose gabapentin to help with neuropathy. Explained that severity of neuropathy may improve once he has better glycemic control.   HLD (hyperlipidemia) -     pravastatin (PRAVACHOL) 20 MG tablet; Take 1 tablet (20 mg total) by mouth daily. Stable, will refill, Last LDL was 12/2013 which was at 16.  Loss of weight Weight has improved dramatically. Will continue Remeron  Depression -     mirtazapine (REMERON) 15 MG tablet; Take 1 tablet (15 mg total) by mouth at bedtime. PHQ-9 score has improved. Patient notes improvement  Pain of both shoulder joints -     traMADol (ULTRAM) 50 MG tablet; Take 1 tablet (50 mg total) by mouth every 12 (twelve) hours as needed. Will only give short course since he has history of substance abuse. He may use NSAID's for mild paina nd Tramadol for severe pain.  Return in about 2 weeks (around 05/24/2015) for Nurse Visit-log review and 3 mo PCP  DM.   Lance Bosch, NP 05/10/2015 3:28 PM

## 2015-05-10 NOTE — Progress Notes (Signed)
Patient here for follow up on his diabetes Patient presents with elevated blood sugar 20 units novolog given per office protocol Patient also needs refills on his medications Patient also complains of numbness to his left pointer finger

## 2015-05-10 NOTE — Patient Instructions (Signed)
Please bring sugar log with you to nurse visit so we can adjust medications as needed

## 2015-05-14 ENCOUNTER — Telehealth: Payer: Self-pay

## 2015-05-14 NOTE — Telephone Encounter (Signed)
-----   Message from Ambrose FinlandValerie A Keck, NP sent at 05/13/2015  2:52 PM EST ----- Labs are normal except for elevated sugar from that day

## 2015-05-14 NOTE — Telephone Encounter (Signed)
Patient not available Unable to leave message  No voice mail attatched

## 2015-05-21 ENCOUNTER — Telehealth: Payer: Self-pay | Admitting: Internal Medicine

## 2015-05-21 ENCOUNTER — Ambulatory Visit: Payer: Self-pay | Attending: Family Medicine

## 2015-05-21 NOTE — Telephone Encounter (Signed)
Pt needs more   feeding supplement, GLUCERNA SHAKE, (GLUCERNA SHAKE) LIQD      Thank you, Sadie Reynolds, ASA

## 2015-05-23 ENCOUNTER — Ambulatory Visit: Payer: Self-pay | Attending: Internal Medicine | Admitting: Pharmacist

## 2015-05-23 VITALS — Wt 134.2 lb

## 2015-05-23 DIAGNOSIS — Z794 Long term (current) use of insulin: Secondary | ICD-10-CM | POA: Insufficient documentation

## 2015-05-23 DIAGNOSIS — E119 Type 2 diabetes mellitus without complications: Secondary | ICD-10-CM | POA: Insufficient documentation

## 2015-05-23 LAB — GLUCOSE, POCT (MANUAL RESULT ENTRY)
POC GLUCOSE: 322 mg/dL — AB (ref 70–99)
POC Glucose: 330 mg/dl — AB (ref 70–99)

## 2015-05-23 MED ORDER — INSULIN ASPART 100 UNIT/ML ~~LOC~~ SOLN
20.0000 [IU] | Freq: Once | SUBCUTANEOUS | Status: AC
  Administered 2015-05-23: 20 [IU] via SUBCUTANEOUS

## 2015-05-23 NOTE — Patient Instructions (Signed)
Thank you for coming to see me!  Take your Lantus 20 units once a day - every day!!!  Check your blood sugar every day before you eat breakfast  Come back and see me in 2 weeks with your meter and blood sugar log

## 2015-05-23 NOTE — Progress Notes (Signed)
S:    Patient arrives in good spirits.  Presents for diabetes follow up.   Patient denies adherence with medications. Current diabetes medications include Lantus 20 units daily and metfomrin 1000 mg BID.   Patient denies hypoglycemic events.  Patient reported dietary habits: eats grits or cereal with fruit for breakfast and then eats a meat, starch, and vegetable for lunch or dinner. Drinks water and diet soda but about twice a week will have a regular soda.   Patient reported exercise habits: walks   Patient reports nocturia about 1 time per night.  Patient reports neuropathy but it has improved with gabapentin. Patient denies acute visual changes. Patient reports self foot exams.   Patient reports that he lost his meter but found it this morning. He has not been checking his blood sugars since he lost it pretty soon after he got it.    O:  Lab Results  Component Value Date   HGBA1C 10.20 05/10/2015    A/P: Diabetes currently uncontrolled based on A1c of 10.2.   Patient denies hypoglycemic events and is able to verbalize appropriate hypoglycemia management plan.  Patient denies adherence with medication. Control is suboptimal due to medication nonadherence.  POCT glucose 330. Administered 20 units of Novolog at this visit. Patient's blood glucose started to decrease (glucose 322) but patient refused to wait any longer or receive any additional insulin.   Continue Lantus 20 units - and instructed patient to take it every day no matter what he eats or doesn't eat. He should also take unless his blood sugar is too low (<70). Patient verbalized understanding. Continue metformin 1000 mg BID. Focused on medication compliance and how the medications work at this visit. Will try to focus more on diet and lifestyle at next visit. Patient instructed to use meter and bring it in to next visit.  Next A1C anticipated February 2017.    Written patient instructions provided.  Total time in face  to face counseling 35 minutes.  Follow up in Pharmacist Clinic Visit in 2 weeks for CBG log review.

## 2015-05-27 ENCOUNTER — Telehealth: Payer: Self-pay

## 2015-05-27 NOTE — Telephone Encounter (Signed)
Returned patient phone call Patient not available Message left on voice mail to return our call 

## 2015-06-05 ENCOUNTER — Encounter: Payer: Self-pay | Admitting: Pharmacist

## 2015-06-07 NOTE — Telephone Encounter (Signed)
traMADol (ULTRAM) 50 MG tablet  Pt. Requesting refill

## 2015-06-10 ENCOUNTER — Other Ambulatory Visit: Payer: Self-pay | Admitting: *Deleted

## 2015-06-10 DIAGNOSIS — E1142 Type 2 diabetes mellitus with diabetic polyneuropathy: Secondary | ICD-10-CM

## 2015-06-10 DIAGNOSIS — M25512 Pain in left shoulder: Secondary | ICD-10-CM

## 2015-06-10 DIAGNOSIS — M25511 Pain in right shoulder: Secondary | ICD-10-CM

## 2015-06-11 ENCOUNTER — Telehealth: Payer: Self-pay

## 2015-06-11 MED ORDER — TRAMADOL HCL 50 MG PO TABS: 50.0000 mg | ORAL_TABLET | Freq: Two times a day (BID) | ORAL | Status: DC | PRN

## 2015-06-11 MED ORDER — GABAPENTIN 300 MG PO CAPS: 300.0000 mg | ORAL_CAPSULE | Freq: Every day | ORAL | Status: DC

## 2015-06-11 NOTE — Telephone Encounter (Signed)
He may come and pick up Ensure. May give 30 tablets of Tramadol to take BID PRN.

## 2015-06-11 NOTE — Telephone Encounter (Signed)
Attempted to contact patient to let him know his tramadol RX is ready for pick up Patient not available Message left on voice mail to return our call

## 2015-06-12 ENCOUNTER — Ambulatory Visit: Payer: Self-pay | Attending: Internal Medicine | Admitting: Pharmacist

## 2015-06-12 DIAGNOSIS — Z794 Long term (current) use of insulin: Secondary | ICD-10-CM | POA: Insufficient documentation

## 2015-06-12 DIAGNOSIS — E119 Type 2 diabetes mellitus without complications: Secondary | ICD-10-CM | POA: Insufficient documentation

## 2015-06-12 LAB — POCT URINALYSIS DIPSTICK
BILIRUBIN UA: NEGATIVE
Glucose, UA: 500
Ketones, UA: NEGATIVE
Nitrite, UA: NEGATIVE
Protein, UA: NEGATIVE
Spec Grav, UA: 1.01
Urobilinogen, UA: 0.2
pH, UA: 5.5

## 2015-06-12 LAB — GLUCOSE, POCT (MANUAL RESULT ENTRY)
POC GLUCOSE: 503 mg/dL — AB (ref 70–99)
POC Glucose: 482 mg/dl — AB (ref 70–99)

## 2015-06-12 MED ORDER — INSULIN ASPART 100 UNIT/ML ~~LOC~~ SOLN
20.0000 [IU] | Freq: Once | SUBCUTANEOUS | Status: AC
  Administered 2015-06-12: 20 [IU] via SUBCUTANEOUS

## 2015-06-12 MED ORDER — TRUEPLUS LANCETS 28G MISC: Status: DC

## 2015-06-12 MED ORDER — INSULIN GLARGINE 100 UNIT/ML SOLOSTAR PEN: 24.0000 [IU] | PEN_INJECTOR | Freq: Every day | SUBCUTANEOUS | Status: DC

## 2015-06-12 NOTE — Progress Notes (Signed)
S:    Patient arrives in good spirits. Presents for diabetes follow up.   Patient denies adherence with medications. Current diabetes medications include Lantus 20 units daily and metfomrin 1000 mg BID. He will skip his insulin if he feels that it is too low (<100).   Patient denies hypoglycemic events.  Patient reported dietary habits: has been eating more fast food, like today for lunch.  Patient reported exercise habits: walks   Patient reports nocturia about multiple per night.  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 Patient denies acute visual changes. Patient reports self foot exams.   Patient did pick up meter but did not bring it to visit.   Patient would like a prescription for Viagra.  O:  Lab Results  Component Value Date   HGBA1C 10.20 05/10/2015   Home Fasting CBGs (reported) 80 - 220 (mostly upper 100s and lower 200s)  POCT glucose: 503; 482  A/P: Diabetes currently uncontrolled based on A1c of 10.2.   Patient denies hypoglycemic events and is able to verbalize appropriate hypoglycemia management plan.  Patient denies adherence with medication. Control is suboptimal due to medication nonadherence.  POCT glucose 503. Administered 20 units of Novolog at this visit. Urine ketones negative. POCT glucose decreased to 482 and patient refused more insulin. He was going to go home and take his Lantus for the day.   Increase Lantus to 24 units - and instructed patient to take it every day no matter what he eats or doesn't eat. Patient verbalized understanding. Continue metformin 1000 mg BID. Discussed complications of diabetes and the importance of getting blood glucose to goal. Patient was unaware that his neuropathy, frequent urination, and vision would improve if his blood glucose was lower. I am hoping this motivates him to be compliant with his medications. Patient instructed to bring meter to next visit with me.  Discussed with [place] CommonsValerie Keck, NP, and will not  give patient prescription for Viagra until his diabetes is under better control.  Next A1C anticipated February 2017.    Written patient instructions provided.  Total time in face to face counseling 35 minutes.  Follow up in Pharmacist Clinic Visit in 2 weeks for CBG log review.

## 2015-06-12 NOTE — Patient Instructions (Addendum)
Thank you for coming to see me!  Getting your blood sugar controlled will help with the numbness, frequent peeing, and blurry vision.   Increase your Lantus to 24 units.  Come back and see me in 2 weeks  Basic Carbohydrate Counting for Diabetes Mellitus Carbohydrate counting is a method for keeping track of the amount of carbohydrates you eat. Eating carbohydrates naturally increases the level of sugar (glucose) in your blood, so it is important for you to know the amount that is okay for you to have in every meal. Carbohydrate counting helps keep the level of glucose in your blood within normal limits. The amount of carbohydrates allowed is different for every person. A dietitian can help you calculate the amount that is right for you. Once you know the amount of carbohydrates you can have, you can count the carbohydrates in the foods you want to eat. Carbohydrates are found in the following foods:  Grains, such as breads and cereals.  Dried beans and soy products.  Starchy vegetables, such as potatoes, peas, and corn.  Fruit and fruit juices.  Milk and yogurt.  Sweets and snack foods, such as cake, cookies, candy, chips, soft drinks, and fruit drinks. CARBOHYDRATE COUNTING There are two ways to count the carbohydrates in your food. You can use either of the methods or a combination of both. Reading the "Nutrition Facts" on Packaged Food The "Nutrition Facts" is an area that is included on the labels of almost all packaged food and beverages in the [place]. It includes the serving size of that food or beverage and information about the nutrients in each serving of the food, including the grams (g) of carbohydrate per serving.  Decide the number of servings of this food or beverage that you will be able to eat or drink. Multiply that number of servings by the number of grams of carbohydrate that is listed on the label for that serving. The total will be the amount of carbohydrates  you will be having when you eat or drink this food or beverage. Learning Standard Serving Sizes of Food When you eat food that is not packaged or does not include "Nutrition Facts" on the label, you need to measure the servings in order to count the amount of carbohydrates.A serving of most carbohydrate-rich foods contains about 15 g of carbohydrates. The following list includes serving sizes of carbohydrate-rich foods that provide 15 g ofcarbohydrate per serving:   1 slice of bread (1 oz) or 1 six-inch tortilla.    of a hamburger bun or English muffin.  4-6 crackers.   cup unsweetened dry cereal.    cup hot cereal.   cup rice or pasta.    cup mashed potatoes or  of a large baked potato.  1 cup fresh fruit or one small piece of fruit.    cup canned or frozen fruit or fruit juice.  1 cup milk.   cup plain fat-free yogurt or yogurt sweetened with artificial sweeteners.   cup cooked dried beans or starchy vegetable, such as peas, corn, or potatoes.  Decide the number of standard-size servings that you will eat. Multiply that number of servings by 15 (the grams of carbohydrates in that serving). For example, if you eat 2 cups of strawberries, you will have eaten 2 servings and 30 g of carbohydrates (2 servings x 15 g = 30 g). For foods such as soups and casseroles, in which more than one food is mixed in, you will need to  count the carbohydrates in each food that is included. EXAMPLE OF CARBOHYDRATE COUNTING Sample Dinner  3 oz chicken breast.   cup of brown rice.   cup of corn.  1 cup milk.   1 cup strawberries with sugar-free whipped topping.  Carbohydrate Calculation Step 1: Identify the foods that contain carbohydrates:   Rice.   Corn.   Milk.   Strawberries. Step 2:Calculate the number of servings eaten of each:   2 servings of rice.   1 serving of corn.   1 serving of milk.   1 serving of strawberries. Step 3: Multiply each of  those number of servings by 15 g:   2 servings of rice x 15 g = 30 g.   1 serving of corn x 15 g = 15 g.   1 serving of milk x 15 g = 15 g.   1 serving of strawberries x 15 g = 15 g. Step 4: Add together all of the amounts to find the total grams of carbohydrates eaten: 30 g + 15 g + 15 g + 15 g = 75 g.   This information is not intended to replace advice given to you by your health care provider. Make sure you discuss any questions you have with your health care provider.   Document Released: 06/08/2005 Document Revised: 06/29/2014 Document Reviewed: 05/05/2013 Elsevier Interactive Patient Education [employer]

## 2015-07-07 ENCOUNTER — Encounter (HOSPITAL_COMMUNITY): Payer: Self-pay | Admitting: *Deleted

## 2015-07-07 ENCOUNTER — Emergency Department (HOSPITAL_COMMUNITY)
Admission: EM | Admit: 2015-07-07 | Discharge: 2015-07-07 | Disposition: A | Discharge status: Home / Self Care | Payer: Self-pay | Attending: Emergency Medicine | Admitting: Emergency Medicine

## 2015-07-07 DIAGNOSIS — Z7984 Long term (current) use of oral hypoglycemic drugs: Secondary | ICD-10-CM | POA: Insufficient documentation

## 2015-07-07 DIAGNOSIS — Z79899 Other long term (current) drug therapy: Secondary | ICD-10-CM | POA: Insufficient documentation

## 2015-07-07 DIAGNOSIS — E119 Type 2 diabetes mellitus without complications: Secondary | ICD-10-CM | POA: Insufficient documentation

## 2015-07-07 DIAGNOSIS — Z791 Long term (current) use of non-steroidal anti-inflammatories (NSAID): Secondary | ICD-10-CM | POA: Insufficient documentation

## 2015-07-07 DIAGNOSIS — Z7982 Long term (current) use of aspirin: Secondary | ICD-10-CM | POA: Insufficient documentation

## 2015-07-07 DIAGNOSIS — I10 Essential (primary) hypertension: Secondary | ICD-10-CM | POA: Insufficient documentation

## 2015-07-07 DIAGNOSIS — Z8639 Personal history of other endocrine, nutritional and metabolic disease: Secondary | ICD-10-CM | POA: Insufficient documentation

## 2015-07-07 DIAGNOSIS — Z794 Long term (current) use of insulin: Secondary | ICD-10-CM | POA: Insufficient documentation

## 2015-07-07 DIAGNOSIS — N472 Paraphimosis: Secondary | ICD-10-CM | POA: Insufficient documentation

## 2015-07-07 DIAGNOSIS — F17200 Nicotine dependence, unspecified, uncomplicated: Secondary | ICD-10-CM | POA: Insufficient documentation

## 2015-07-07 LAB — URINALYSIS, ROUTINE W REFLEX MICROSCOPIC
Bilirubin Urine: NEGATIVE
Glucose, UA: >1000 mg/dL — AB
HGB URINE DIPSTICK: NEGATIVE
Ketones, ur: NEGATIVE mg/dL
LEUKOCYTES UA: NEGATIVE
Nitrite: NEGATIVE
Protein, ur: NEGATIVE mg/dL
Specific Gravity, Urine: 1.027 (ref 1.005–1.030)
pH: 5 (ref 5.0–8.0)

## 2015-07-07 LAB — URINE MICROSCOPIC-ADD ON: BACTERIA UA: NONE SEEN

## 2015-07-07 MED ORDER — HYDROMORPHONE HCL 1 MG/ML IJ SOLN: 1.0000 mg | Freq: Once | INTRAMUSCULAR | Status: DC

## 2015-07-07 MED ORDER — HYDROMORPHONE HCL 1 MG/ML IJ SOLN
1.0000 mg | Freq: Once | INTRAMUSCULAR | Status: AC
  Administered 2015-07-07: 1 mg via INTRAMUSCULAR
  Filled 2015-07-07: qty 1

## 2015-07-07 MED ORDER — HYDROCODONE-ACETAMINOPHEN 5-325 MG PO TABS: 1.0000 | ORAL_TABLET | Freq: Four times a day (QID) | ORAL | Status: DC | PRN

## 2015-07-07 MED ORDER — NAPROXEN 500 MG PO TABS: 500.0000 mg | ORAL_TABLET | Freq: Two times a day (BID) | ORAL | Status: DC

## 2015-07-07 MED ORDER — HYDROCODONE-ACETAMINOPHEN 5-325 MG PO TABS
1.0000 | ORAL_TABLET | Freq: Once | ORAL | Status: AC
  Administered 2015-07-07: 1 via ORAL
  Filled 2015-07-07: qty 1

## 2015-07-07 MED ORDER — LIDOCAINE HCL 2 % EX GEL
1.0000 "application " | Freq: Once | CUTANEOUS | Status: AC
  Administered 2015-07-07: 1 via TOPICAL
  Filled 2015-07-07: qty 20

## 2015-07-07 MED ORDER — LIDOCAINE HCL (PF) 1 % IJ SOLN
2.0000 mL | Freq: Once | INTRAMUSCULAR | Status: DC
  Filled 2015-07-07: qty 5

## 2015-07-07 NOTE — ED Notes (Signed)
At bedside with EDP, was able to slide swollen foreskin over swollen glans

## 2015-07-07 NOTE — ED Notes (Signed)
Pt c/o penis swelling onset x 3-4 days, with hx of the same, pt states, "I didn't get circumcised & now when I pee the skin is swollen & will not go back over the head of my penis." pt reports ability to urinate, denies dysuria, appear to have skin tear to upper penis, denies current penile discharge, A&O x4

## 2015-07-07 NOTE — ED Provider Notes (Signed)
CSN: [id number]     Arrival date & time 07/07/15  1411 History   First MD Initiated Contact with Patient 07/07/15 1710     Chief Complaint  Patient presents with  . Penis Pain     (Consider location/radiation/quality/duration/timing/severity/associated sxs/prior Treatment) HPI Comments: 55 year old male with a history of diabetes, hypertension, hyperlipidemia presents with concern for penile swelling. Patient reports 5 days of white colored discharge from the end of his penis, and reports retracting his foreskin 2 days ago was unable to reduce it.  Has not been sexually active in 1 year and had been tested prior for gc/chl.  Denies dysuria, hematuria, difficulty urinating.  Severe pain and swelling to tip of penis, constant, not changing Tried to pull back foreskin however was unable to   Patient is a 55 y.o. male presenting with penile discharge.  Penile Discharge This is a new problem. Episode onset: 5 days ago. The problem occurs constantly. The problem has not changed since onset.Pertinent negatives include no chest pain, no abdominal pain, no headaches and no shortness of breath. Nothing aggravates the symptoms. Nothing relieves the symptoms. He has tried nothing for the symptoms. The treatment provided no relief.    Past Medical History  Diagnosis Date  . Heroin abuse   . Diabetes mellitus without complication ([place])   . Hyperlipidemia   . Hypertension    Past Surgical History  Procedure Laterality Date  . I&d extremity Right 03/20/2013    Procedure: IRRIGATION AND DEBRIDEMENT EXTREMITY;  Surgeon: Schuyler Amor, MD;  Location: [facility];  Service: Orthopedics;  Laterality: Right;   Family History  Problem Relation Age of Onset  . Diabetes Mother   . Heart disease Mother   . Hypertension Mother   . Stroke Mother   . Vision loss Mother   . Heart disease Father   . Hypertension Father    Social History  Substance Use Topics  . Smoking status: Current Every Day Smoker --  1.10 packs/day for 11 years  . Smokeless tobacco: Never Used     Comment: Smoking .5-1 ppd  . Alcohol Use: 0.0 oz/week    0 Standard drinks or equivalent per week    Review of Systems  Constitutional: Negative for fever.  HENT: Negative for sore throat.   Eyes: Negative for visual disturbance.  Respiratory: Negative for shortness of breath.   Cardiovascular: Negative for chest pain.  Gastrointestinal: Negative for abdominal pain.  Genitourinary: Positive for discharge (didn't seem to be coming from end of penis, however white material on outside around foreskin), penile swelling and penile pain. Negative for dysuria, frequency, scrotal swelling, difficulty urinating, genital sores and testicular pain.  Musculoskeletal: Negative for back pain and neck stiffness.  Skin: Negative for rash.  Neurological: Negative for syncope and headaches.      Allergies  Review of patient's allergies indicates no known allergies.  Home Medications   Prior to Admission medications   Medication Sig Start Date End Date Taking? Authorizing Provider  aspirin EC 81 MG tablet Take 1 tablet (81 mg total) by mouth daily. 04/04/14  Yes Lance Bosch, NP  Cholecalciferol (VITAMIN D-3) 1000 UNITS CAPS Take 1,000 Units by mouth daily.    Yes Historical Provider, MD  gabapentin (NEURONTIN) 300 MG capsule Take 1 capsule (300 mg total) by mouth at bedtime. For diabetic nerve pain 06/11/15  Yes Lance Bosch, NP  Insulin Glargine (LANTUS SOLOSTAR) 100 UNIT/ML Solostar Pen Inject 24 Units into the skin daily  at 10 pm. Patient taking differently: Inject 20 Units into the skin at bedtime.  06/12/15  Yes Tresa Garter, MD  metFORMIN (GLUCOPHAGE) 1000 MG tablet Take 1 tablet (1,000 mg total) by mouth 2 (two) times daily with a meal. 05/10/15  Yes Lance Bosch, NP  mirtazapine (REMERON) 15 MG tablet Take 1 tablet (15 mg total) by mouth at bedtime. Patient taking differently: Take 15 mg by mouth at bedtime as  needed (sleep).  05/10/15  Yes Lance Bosch, NP  Multiple Vitamin (MULTIVITAMIN WITH MINERALS) TABS tablet Take 1 tablet by mouth daily.   Yes Historical Provider, MD  pravastatin (PRAVACHOL) 20 MG tablet Take 1 tablet (20 mg total) by mouth daily. 05/10/15  Yes Lance Bosch, NP  glucose blood (TRUE METRIX BLOOD GLUCOSE TEST) test strip Check sugars 3 times per day 05/10/15   Lance Bosch, NP  glucose monitoring kit (FREESTYLE) monitoring kit 1 each by Does not apply route as needed for other. 12/27/13   Lance Bosch, NP  HYDROcodone-acetaminophen (NORCO/VICODIN) 5-325 MG tablet Take 1-2 tablets by mouth every 6 (six) hours as needed. 07/07/15   Gareth Morgan, MD  naproxen (NAPROSYN) 500 MG tablet Take 1 tablet (500 mg total) by mouth 2 (two) times daily with a meal. 07/07/15   Gareth Morgan, MD  traMADol (ULTRAM) 50 MG tablet Take 1 tablet (50 mg total) by mouth every 12 (twelve) hours as needed. Patient not taking: Reported on 07/07/2015 06/11/15   Lance Bosch, NP  TRUEPLUS LANCETS 28G MISC Check sugars 3 times per day 06/12/15   Tresa Garter, MD   BP 104/61 mmHg  Pulse 71  Temp(Src) 97.7 F (36.5 C) (Oral)  Resp 17  SpO2 99% Physical Exam  Constitutional: He is oriented to person, place, and time. He appears well-developed and well-nourished. No distress.  HENT:  Head: Normocephalic and atraumatic.  Eyes: Conjunctivae and EOM are normal.  Neck: Normal range of motion.  Cardiovascular: Normal rate, regular rhythm, normal heart sounds and intact distal pulses.  Exam reveals no gallop and no friction rub.   No murmur heard. Pulmonary/Chest: Effort normal and breath sounds normal. No respiratory distress. He has no wheezes. He has no rales.  Abdominal: Soft. He exhibits no distension. There is no tenderness. There is no guarding.  Genitourinary: Testes normal. Right testis shows no swelling. Left testis shows no swelling. Uncircumcised. Paraphimosis and penile erythema  (erythema and swelling glans) present. No discharge found.  No sign of necrosis Small area of ulceration posterior at edge of foreskin and glans  Musculoskeletal: He exhibits no edema.  Neurological: He is alert and oriented to person, place, and time.  Skin: Skin is warm and dry. He is not diaphoretic.  Nursing note and vitals reviewed.   ED Course  Reduction of dislocation Date/Time: 07/08/2015 2:17 AM Performed by: Gareth Morgan Authorized by: Gareth Morgan Consent: Verbal consent obtained. Risks and benefits: risks, benefits and alternatives were discussed Required items: required blood products, implants, devices, and special equipment available Time out: Immediately prior to procedure a "time out" was called to verify the correct patient, procedure, equipment, support staff and site/side marked as required. Patient sedated: no Patient tolerance: Patient tolerated the procedure well with no immediate complications Comments: Paraphimosis reduction   (including critical care time) Labs Review Labs Reviewed  URINALYSIS, ROUTINE W REFLEX MICROSCOPIC (NOT AT [facility]) - Abnormal; Notable for the following:    Glucose, UA >1000 (*)    All  other components within normal limits  URINE MICROSCOPIC-ADD ON - Abnormal; Notable for the following:    Squamous Epithelial / LPF 0-5 (*)    All other components within normal limits  URINE CULTURE  GC/CHLAMYDIA PROBE AMP ([place]) NOT AT [facility]    Imaging Review No results found. I have personally reviewed and evaluated these images and lab results as part of my medical decision-making.   EKG Interpretation None      MDM   Final diagnoses:  Paraphimosis   55 year old male with a history of diabetes, hypertension, hyperlipidemia presents with concern for penile swelling. Patient reports 5 days of white colored discharge from the end of his penis, and reports retracting his foreskin 2 days ago was unable to reduce it.  Urinalysis  shows no signs of infection. Have low suspicion for GC Chlamydia by history, and symptoms described are likely secondary to phimosis.  Patient has paraphimosis on exam without necrosis, able to urinate without difficulty. Lidocaine jelly, pressure, and ice were placed, and manual reduction was performed at bedside. Patient was able to urinate prior to and after reduction.  No sign of cellulitis.  Discussed with urology Dr. Louis Meckel, he will schedule patient for close outpatient appointment.  In addition, pt reports he will refill insulin and follow up with doctor regarding glucose.  No sign of dka on urinalysis. Patient discharged in stable condition with understanding of reasons to return.   Gareth Morgan, MD 07/08/15 [place]

## 2015-07-07 NOTE — Discharge Instructions (Signed)
Paraphimosis Paraphimosis is a serious condition in which the fold of skin that stretches over the tip of the penis (foreskin) becomes stuck when it is pulled back. This condition blocks blood from flowing away from the penis tip, and that causes swelling that gets worse and worse. Paraphimosis needs to be treated right away. CAUSES This condition may be caused by:  A foreskin that is tighter than normal.  Leaving the foreskin pulled back after a procedure, such as after the placement of a catheter.  A foreskin that has been pulled back for too long.  A forceful pulling back of the foreskin.  Infection under the foreskin.  Trauma to the area, such as a hard hit to the the tip of the penis.  Having sex or masturbating.  Hair or clothing that gets wrapped around the penis. RISK FACTORS This condition is more likely to develop in:  Males who have not had their foreskin removed.  Males who have frequent urological procedures.  Males who have poor hygiene.  Males who need help with daily hygiene from a caregiver. SYMPTOMS Symptoms of this condition include:  A foreskin that cannot be returned to its normal position.  Pain, often at the tip of the penis.  Swelling of the penis.  Anxiety.  Trouble urinating.  Skin that is red or bluish at the tip of the penis. DIAGNOSIS This condition may be diagnosed with a physical exam. You may also have X-rays. TREATMENT This condition may be treated with:  A procedure to force the foreskin back over the tip of the penis. Swelling may first be reduced with:  Ice.  A bandage wrapped tightly around the penis.  Needles to drain any pus or blood that is causing the swelling.  Medicine to relieve pain. Medicine may be given by mouth, through an IV tube, or by an injection to the base of the penis (nerve block).  A procedure in which a small cut (incision) is made in the tightened foreskin to free it and allow it to be pulled back  into place (dorsal slit procedure).  Surgery to remove the foreskin (circumcision). This may be done if the foreskin cannot be moved back into place. Most of the time, treatment can be done in a clinic or a health care provider's office. HOME CARE INSTRUCTIONS  Take over-the-counter and prescription medicines only as told by your health care provider.  Apply any creams to the affected area only as told by your health care provider.  If the treated area is covered with gauze or a bandage (dressing), follow instructions from your health care provider about:  When and how you should change your bandage (dressing).  When you should remove your dressing.  Whether the area can get wet.  Wear loose undergarments to avoid applying pressure to the area.  Follow instructions from your health care provider about avoiding sexual activity.  Keep all follow-up visits as told by your health care provider. This is important. SEEK MEDICAL CARE IF:  The treated area of skin does not heal, or it becomes more irritated, red, or bloody.  Urination is difficult or painful.  Pain in the penis continues, even after you take medicine for pain. SEEK IMMEDIATE MEDICAL CARE IF:  You develop a fever.   This information is not intended to replace advice given to you by your health care provider. Make sure you discuss any questions you have with your health care provider.   Document Released: 04/05/2009 Document Revised: 02/27/2015  Document Reviewed: 09/03/2014 Elsevier Interactive Patient Education [employer]

## 2015-07-08 LAB — URINE CULTURE: Culture: 1000

## 2015-07-08 LAB — GC/CHLAMYDIA PROBE AMP (~~LOC~~) NOT AT ARMC
Chlamydia: NEGATIVE
Neisseria Gonorrhea: NEGATIVE

## 2015-07-08 MED FILL — ?MIRTAZAPINE 15 MG TABLET: 15 | 30 days supply | Qty: 30 | Fill #1

## 2015-07-08 MED FILL — PRAVASTATIN NA 20 MG TAB: 20 | 30 days supply | Qty: 30 | Fill #1

## 2015-07-08 MED FILL — HYDROCODON-APAP 5-325: 5-325 | 2 days supply | Qty: 10 | Fill #0

## 2015-07-08 MED FILL — GABAPENTIN 300 MG CAPSULE: 300 | 30 days supply | Qty: 30 | Fill #1

## 2015-07-08 MED FILL — !LANTUS SOLOSTAR 100UNITS/M: 100 | 12 days supply | Qty: 3 | Fill #0

## 2015-07-08 MED FILL — NAPROXEN 500 MG TABLET: 500 | 15 days supply | Qty: 30 | Fill #0

## 2015-07-08 MED FILL — TRUEplus LANCETS 28G MISC: 33 days supply | Qty: 100 | Fill #1

## 2015-07-09 ENCOUNTER — Telehealth: Payer: Self-pay | Admitting: Internal Medicine

## 2015-07-09 ENCOUNTER — Telehealth: Payer: Self-pay

## 2015-07-09 NOTE — Telephone Encounter (Signed)
Returned patient phone call Patient not available Message left on voice mail to return our call 

## 2015-07-09 NOTE — Telephone Encounter (Signed)
Pt. Sister called requesting a referral to the urology, due to the ED visit that the pt. Had. Please f/u.

## 2015-07-10 NOTE — Telephone Encounter (Signed)
Patient returned nurse phone call, PAtient available after 12. Please follow up.

## 2015-07-11 ENCOUNTER — Telehealth: Payer: Self-pay

## 2015-07-11 DIAGNOSIS — N472 Paraphimosis: Secondary | ICD-10-CM

## 2015-07-11 NOTE — Telephone Encounter (Signed)
Spoke with patient's sister and she explained that the patient was in the ED For paraphimosis and requesting a referral to urology Referral placed in epic

## 2015-07-12 ENCOUNTER — Other Ambulatory Visit: Payer: Self-pay | Admitting: Pharmacist

## 2015-07-12 NOTE — Telephone Encounter (Signed)
Patient would like his prescription sent to the [facility] pharmacy. However, only [facility] providers can prescribe medications to be filled by a [place] pharmacy. Called the patient to follow up but was unable to reach him. Left a HIPAA-compliant message for him to call me back.

## 2015-07-15 ENCOUNTER — Ambulatory Visit: Payer: Self-pay | Attending: Internal Medicine | Admitting: Internal Medicine

## 2015-07-15 ENCOUNTER — Encounter: Payer: Self-pay | Admitting: Internal Medicine

## 2015-07-15 VITALS — BP 127/81 | HR 105 | Temp 98.0°F | Resp 16 | Ht 67.0 in | Wt 129.0 lb

## 2015-07-15 DIAGNOSIS — N472 Paraphimosis: Secondary | ICD-10-CM

## 2015-07-15 DIAGNOSIS — E119 Type 2 diabetes mellitus without complications: Secondary | ICD-10-CM

## 2015-07-15 DIAGNOSIS — Z794 Long term (current) use of insulin: Secondary | ICD-10-CM

## 2015-07-15 DIAGNOSIS — M25511 Pain in right shoulder: Secondary | ICD-10-CM

## 2015-07-15 DIAGNOSIS — M25512 Pain in left shoulder: Secondary | ICD-10-CM

## 2015-07-15 LAB — POCT GLYCOSYLATED HEMOGLOBIN (HGB A1C): Hemoglobin A1C: 12.7

## 2015-07-15 LAB — GLUCOSE, POCT (MANUAL RESULT ENTRY): POC Glucose: 87 mg/dl (ref 70–99)

## 2015-07-15 MED ORDER — TRAMADOL HCL 50 MG PO TABS: 50.0000 mg | ORAL_TABLET | Freq: Two times a day (BID) | ORAL | Status: DC | PRN

## 2015-07-15 MED FILL — traMADol HCL 50 MG TABS: 50 | 15 days supply | Qty: 30 | Fill #0

## 2015-07-15 NOTE — Progress Notes (Signed)
Patient ID: Raymond Winters, male   DOB: [birth year], 55 y.o.   MRN: [id number] SUBJECTIVE: 55 y.o. male for follow up of diabetes. Has a past medical history of HLD and diabetic neuropathy. Diabetic Review of Systems - medication compliance: noncompliant much of the time---only been taking 20 units of insulin instead of prescribed 24 units, diabetic diet compliance: noncompliant some of the time, home glucose monitoring: is performed regularly, further diabetic ROS: no polyuria or polydipsia, no chest pain, dyspnea or TIA's, no hypoglycemia, has dysesthesias in the feet.  Other symptoms and concerns: Patient continuing to have bilateral shoulder pain. Has not been seen by Orthopedics.  Patient reports that he was seen in the ED last week for symptoms of paraphimosis and was referred to urology. He is wondering if referral went through. Symptoms have improved but he would like resolution of problem. Current Outpatient Prescriptions  Medication Sig Dispense Refill  . aspirin EC 81 MG tablet Take 1 tablet (81 mg total) by mouth daily. 30 tablet 5  . gabapentin (NEURONTIN) 300 MG capsule Take 1 capsule (300 mg total) by mouth at bedtime. For diabetic nerve pain 30 capsule 3  . Insulin Glargine (LANTUS SOLOSTAR) 100 UNIT/ML Solostar Pen Inject 24 Units into the skin daily at 10 pm. (Patient taking differently: Inject 20 Units into the skin at bedtime. ) 5 pen 10  . metFORMIN (GLUCOPHAGE) 1000 MG tablet Take 1 tablet (1,000 mg total) by mouth 2 (two) times daily with a meal. 60 tablet 4  . mirtazapine (REMERON) 15 MG tablet Take 1 tablet (15 mg total) by mouth at bedtime. (Patient taking differently: Take 15 mg by mouth at bedtime as needed (sleep). ) 30 tablet 2  . Multiple Vitamin (MULTIVITAMIN WITH MINERALS) TABS tablet Take 1 tablet by mouth daily.    . naproxen (NAPROSYN) 500 MG tablet Take 1 tablet (500 mg total) by mouth 2 (two) times daily with a meal. 30 tablet 0  . pravastatin (PRAVACHOL) 20 MG tablet Take 1  tablet (20 mg total) by mouth daily. 30 tablet 4  . traMADol (ULTRAM) 50 MG tablet Take 1 tablet (50 mg total) by mouth every 12 (twelve) hours as needed. 30 tablet 0  . Cholecalciferol (VITAMIN D-3) 1000 UNITS CAPS Take 1,000 Units by mouth daily.     Marland Kitchen glucose blood (TRUE METRIX BLOOD GLUCOSE TEST) test strip Check sugars 3 times per day 100 each 12  . glucose monitoring kit (FREESTYLE) monitoring kit 1 each by Does not apply route as needed for other. 1 each 0  . HYDROcodone-acetaminophen (NORCO/VICODIN) 5-325 MG tablet Take 1-2 tablets by mouth every 6 (six) hours as needed. 10 tablet 0  . TRUEPLUS LANCETS 28G MISC Check sugars 3 times per day 100 each 12   No current facility-administered medications for this visit.  Review of System: Other than what is stated in HPI, all other systems are negative.   OBJECTIVE: Appearance: alert, well appearing, and in no distress, oriented to person, place, and time and normal appearing weight. BP 127/81 mmHg  Pulse 105  Temp(Src) 98 F (36.7 C)  Resp 16  Ht '5\' 7"'$  (1.702 m)  Wt 129 lb (58.514 kg)  BMI 20.20 kg/m2  SpO2 99%  Exam: heart sounds normal rate, regular rhythm, normal S1, S2, no murmurs, rubs, clicks or gallops, no JVD, chest clear, no hepatosplenomegaly, no carotid bruits, feet: no trophic changes or ulcerative lesions, normal DP and PT pulses, normal monofilament exam and normal sensory exam  ASSESSMENT:  [name] was seen today for follow-up.  Diagnoses and all orders for this visit:  Type 2 diabetes mellitus without complication, with long-term current use of insulin (HCC) -     Glucose (CBG) -     HgB A1c Patient's A1C has increased from last visit. I have encouraged him to take the recommended amount of daily insulin to help lower cbg. Diet discussed in detail. Long term complication addressed.  Pain of both shoulder joints -     traMADol (ULTRAM) 50 MG tablet; Take 1 tablet (50 mg total) by mouth every 12 (twelve) hours as  needed. -     Ambulatory referral to Orthopedic Surgery  Paraphimosis -     Ambulatory referral to Urology   PLAN: See orders for this visit as documented in the electronic medical record. Issues reviewed with him: diabetic diet discussed in detail, written exchange diet given, low cholesterol diet, weight control and daily exercise discussed, glucose meter dispensed to patient, foot care discussed and Podiatry visits discussed, annual eye examinations at Ophthalmology discussed and long term diabetic complications discussed.   Return in about 2 weeks (around 07/29/2015) for Nurse Visit-log review and 3 mo PCP .   Lance Bosch, NP 07/15/2015 6:54 PM

## 2015-07-15 NOTE — Patient Instructions (Addendum)
Harvin Hazel will be out to speak with you in a few  Come back with sugar log in  2weeks for medication review with Kennyth Arnold

## 2015-07-15 NOTE — Progress Notes (Signed)
Patient here for follow up on his diabetes Patient complains of bilateral shoulder pain Patient is requesting a refill on his tramadol

## 2015-07-24 ENCOUNTER — Ambulatory Visit (INDEPENDENT_AMBULATORY_CARE_PROVIDER_SITE_OTHER): Payer: Self-pay | Admitting: Family Medicine

## 2015-07-24 ENCOUNTER — Encounter: Payer: Self-pay | Admitting: Family Medicine

## 2015-07-24 ENCOUNTER — Ambulatory Visit
Admission: RE | Admit: 2015-07-24 | Discharge: 2015-07-24 | Disposition: A | Discharge status: Home / Self Care | Payer: No Typology Code available for payment source | Source: Ambulatory Visit | Attending: Family Medicine | Admitting: Family Medicine

## 2015-07-24 VITALS — BP 120/80 | HR 102 | Ht 67.0 in | Wt 135.0 lb

## 2015-07-24 DIAGNOSIS — M19011 Primary osteoarthritis, right shoulder: Secondary | ICD-10-CM

## 2015-07-24 DIAGNOSIS — M25512 Pain in left shoulder: Secondary | ICD-10-CM | POA: Insufficient documentation

## 2015-07-24 DIAGNOSIS — M19019 Primary osteoarthritis, unspecified shoulder: Secondary | ICD-10-CM | POA: Insufficient documentation

## 2015-07-24 MED ORDER — MELOXICAM 15 MG PO TABS: 15.0000 mg | ORAL_TABLET | Freq: Every day | ORAL | Status: DC

## 2015-07-24 MED ORDER — METHYLPREDNISOLONE ACETATE 40 MG/ML IJ SUSP
40.0000 mg | Freq: Once | INTRAMUSCULAR | Status: AC
  Administered 2015-07-24: 40 mg via INTRA_ARTICULAR

## 2015-07-24 MED FILL — MELOXICAM 15 MG TABLET: 15 | 30 days supply | Qty: 30 | Fill #0

## 2015-07-24 NOTE — Assessment & Plan Note (Signed)
No pain on exam w/ negative cross arm maneuver.  Continue to monitor.

## 2015-07-24 NOTE — Assessment & Plan Note (Signed)
May be also related to glenohumeral arthritis. We'll also be concern for internal impingement secondary to rotator cuff tendinopathy. 3 view left shoulder -Start Mobic 15 mg daily. Follow-up in 2 weeks to see how he is doing and consider subacromial injection and home exercise program

## 2015-07-24 NOTE — Assessment & Plan Note (Signed)
Most likely the cause of his pain as he does have pain with motion and his cuff strength is great. Intra-articular steroid injection done today under ultrasound guidance. Follow-up in 2 weeks to see how he is doing.   Aspiration/Injection Procedure Note Zeki Bedrosian 08/05/1960  Procedure: Injection Indications: R shoulder pain   Procedure Details Consent: Risks of procedure as well as the alternatives and risks of each were explained to the (patient/caregiver).  Consent for procedure obtained. Time Out: Verified patient identification, verified procedure, site/side was marked, verified correct patient position, special equipment/implants available, medications/allergies/relevent history reviewed, required imaging and test results available.  Performed.  The area was cleaned with iodine and alcohol swabs.    The R GH joint was injected using 2 cc's of  Depomedrol and 2 cc's of 1% lidocaine with a 21 1 1/2" needle.  Ultrasound was used. Images were obtained in Transverse and Long views showing the injection.     A sterile dressing was applied.  Patient did tolerate procedure well. Estimated blood loss: None

## 2015-07-24 NOTE — Progress Notes (Signed)
  Raymond Winters - 55 y.o. male MRN [id number]  Date of birth: 08/22/1960  SUBJECTIVE:  Including CC & ROS.  Raymond Winters is a 55 y.o. male who presents today for Bilateral shoulder pain.    Shoulder Pain bilateral, initial visit - patient returns today for ongoing right anterior shoulder pain. Feels as if this is a decrease in motion and has pain with motion at times. Ongoing now for several years previously had a x-ray done in 2015 which did show some glenohumeral arthritis. Has not had any treatment to date. No previous injury. Also describing some left shoulder pain that he feels is compensatory. Denies injury to left shoulder and he is right hand dominant.no paresthesias or neck pain.  Night pain? No. Trauma? No. Overhead pain? No. Instability? No. Numbness/Weakness? No.  PMHx - Updated and reviewed.  Contributory factors include:  Type 2 diabetes and herion abuse  PSHx - Updated and reviewed.  Contributory factors include:  noncontributory FHx - Updated and reviewed.  Contributory factors include:  noncontributory Medications - updated and reviewed   ROS Per HPI   PE: Filed Vitals:   07/24/15 1426  BP: 120/80  Pulse: 102   Gen: NAD, AAO 3 Cardiorespiratory - Normal respiratory effort/rate.  RRR Skin: No rashes or erythema Extremities: No edema, pulses +2 bilateral upper and lower extremity  MSK Shoulder:  B/L Shoulder: Inspection reveals no abnormalities, atrophy or asymmetry. Palpation is normal with no tenderness over AC joint or bicipital groove. ROM - R 150 degrees flexion, 60 degrees extension, 120 abduction, 90 ER/IR.  L Shoulder - Nml ROM  Rotator cuff strength normal throughout. No signs of impingement with negative Neer and Hawkin's tests, empty can. Speeds and Yergason's tests normal. Normal scapular function observed. No painful arc and no drop arm sign. No apprehension sign and no Jobe relocation sign  Negative Cross arm maneuver against resistance   Neurovascular  status - Intact B/L UE

## 2015-08-07 ENCOUNTER — Ambulatory Visit: Payer: Self-pay | Admitting: Family Medicine

## 2015-08-22 ENCOUNTER — Other Ambulatory Visit: Payer: Self-pay | Admitting: Internal Medicine

## 2015-08-22 MED FILL — LANTUS SOLOSTAR 100 UNITS/M: 100 | 12 days supply | Qty: 3 | Fill #1

## 2015-08-22 MED FILL — ?METFORMIN HCL 1,000 MG TAB: 1000 | 30 days supply | Qty: 60 | Fill #1

## 2015-08-23 MED FILL — MELOXICAM 15 MG TABLET: 15 | 30 days supply | Qty: 30 | Fill #1

## 2015-08-23 MED FILL — ?MIRTAZAPINE 15 MG TABLET: 15 | 30 days supply | Qty: 30 | Fill #2

## 2015-08-23 MED FILL — PRAVASTATIN NA 20 MG TAB: 20 | 30 days supply | Qty: 30 | Fill #2

## 2015-09-15 ENCOUNTER — Emergency Department (HOSPITAL_COMMUNITY): Payer: Self-pay

## 2015-09-15 ENCOUNTER — Emergency Department (HOSPITAL_COMMUNITY)
Admission: EM | Admit: 2015-09-15 | Discharge: 2015-09-15 | Disposition: A | Discharge status: Home / Self Care | Payer: Self-pay | Attending: Emergency Medicine | Admitting: Emergency Medicine

## 2015-09-15 ENCOUNTER — Encounter (HOSPITAL_COMMUNITY): Payer: Self-pay | Admitting: Emergency Medicine

## 2015-09-15 DIAGNOSIS — E785 Hyperlipidemia, unspecified: Secondary | ICD-10-CM | POA: Insufficient documentation

## 2015-09-15 DIAGNOSIS — Y9389 Activity, other specified: Secondary | ICD-10-CM | POA: Insufficient documentation

## 2015-09-15 DIAGNOSIS — Z7984 Long term (current) use of oral hypoglycemic drugs: Secondary | ICD-10-CM | POA: Insufficient documentation

## 2015-09-15 DIAGNOSIS — W11XXXA Fall on and from ladder, initial encounter: Secondary | ICD-10-CM | POA: Insufficient documentation

## 2015-09-15 DIAGNOSIS — Y998 Other external cause status: Secondary | ICD-10-CM | POA: Insufficient documentation

## 2015-09-15 DIAGNOSIS — S3991XA Unspecified injury of abdomen, initial encounter: Secondary | ICD-10-CM | POA: Insufficient documentation

## 2015-09-15 DIAGNOSIS — Y9289 Other specified places as the place of occurrence of the external cause: Secondary | ICD-10-CM | POA: Insufficient documentation

## 2015-09-15 DIAGNOSIS — Z794 Long term (current) use of insulin: Secondary | ICD-10-CM | POA: Insufficient documentation

## 2015-09-15 DIAGNOSIS — E1165 Type 2 diabetes mellitus with hyperglycemia: Secondary | ICD-10-CM | POA: Insufficient documentation

## 2015-09-15 DIAGNOSIS — F17200 Nicotine dependence, unspecified, uncomplicated: Secondary | ICD-10-CM | POA: Insufficient documentation

## 2015-09-15 DIAGNOSIS — S27329A Contusion of lung, unspecified, initial encounter: Secondary | ICD-10-CM

## 2015-09-15 DIAGNOSIS — Z79899 Other long term (current) drug therapy: Secondary | ICD-10-CM | POA: Insufficient documentation

## 2015-09-15 DIAGNOSIS — S20212A Contusion of left front wall of thorax, initial encounter: Secondary | ICD-10-CM | POA: Insufficient documentation

## 2015-09-15 DIAGNOSIS — Z7982 Long term (current) use of aspirin: Secondary | ICD-10-CM | POA: Insufficient documentation

## 2015-09-15 DIAGNOSIS — R739 Hyperglycemia, unspecified: Secondary | ICD-10-CM

## 2015-09-15 DIAGNOSIS — I10 Essential (primary) hypertension: Secondary | ICD-10-CM | POA: Insufficient documentation

## 2015-09-15 DIAGNOSIS — Z791 Long term (current) use of non-steroidal anti-inflammatories (NSAID): Secondary | ICD-10-CM | POA: Insufficient documentation

## 2015-09-15 LAB — COMPREHENSIVE METABOLIC PANEL
ALT: 22 U/L (ref 17–63)
AST: 28 U/L (ref 15–41)
Albumin: 2.6 g/dL — ABNORMAL LOW (ref 3.5–5.0)
Alkaline Phosphatase: 77 U/L (ref 38–126)
Anion gap: 15 (ref 5–15)
BILIRUBIN TOTAL: 0.7 mg/dL (ref 0.3–1.2)
BUN: 6 mg/dL (ref 6–20)
CO2: 20 mmol/L — ABNORMAL LOW (ref 22–32)
Calcium: 8.8 mg/dL — ABNORMAL LOW (ref 8.9–10.3)
Chloride: 95 mmol/L — ABNORMAL LOW (ref 101–111)
Creatinine, Ser: 0.89 mg/dL (ref 0.61–1.24)
Glucose, Bld: 379 mg/dL — ABNORMAL HIGH (ref 65–99)
POTASSIUM: 4.4 mmol/L (ref 3.5–5.1)
Sodium: 130 mmol/L — ABNORMAL LOW (ref 135–145)
TOTAL PROTEIN: 7.7 g/dL (ref 6.5–8.1)

## 2015-09-15 LAB — URINALYSIS, ROUTINE W REFLEX MICROSCOPIC
BILIRUBIN URINE: NEGATIVE
Glucose, UA: >1000 mg/dL — AB
Ketones, ur: 15 mg/dL — AB
NITRITE: NEGATIVE
Protein, ur: NEGATIVE mg/dL
Specific Gravity, Urine: 1.025 (ref 1.005–1.030)
pH: 6 (ref 5.0–8.0)

## 2015-09-15 LAB — URINE MICROSCOPIC-ADD ON

## 2015-09-15 LAB — CBC WITH DIFFERENTIAL/PLATELET
BASOS ABS: 0 10*3/uL (ref 0.0–0.1)
Basophils Relative: 0 %
EOS PCT: 0 %
Eosinophils Absolute: 0 10*3/uL (ref 0.0–0.7)
HEMATOCRIT: 36.6 % — AB (ref 39.0–52.0)
Hemoglobin: 12.6 g/dL — ABNORMAL LOW (ref 13.0–17.0)
LYMPHS ABS: 1 10*3/uL (ref 0.7–4.0)
LYMPHS PCT: 12 %
MCH: 26.8 pg (ref 26.0–34.0)
MCHC: 34.4 g/dL (ref 30.0–36.0)
MCV: 77.9 fL — AB (ref 78.0–100.0)
MONO ABS: 0.8 10*3/uL (ref 0.1–1.0)
MONOS PCT: 9 %
NEUTROS ABS: 6.7 10*3/uL (ref 1.7–7.7)
Neutrophils Relative %: 79 %
PLATELETS: 248 10*3/uL (ref 150–400)
RBC: 4.7 MIL/uL (ref 4.22–5.81)
RDW: 13.2 % (ref 11.5–15.5)
WBC: 8.6 10*3/uL (ref 4.0–10.5)

## 2015-09-15 LAB — CBG MONITORING, ED: GLUCOSE-CAPILLARY: 255 mg/dL — AB (ref 65–99)

## 2015-09-15 LAB — LIPASE, BLOOD: LIPASE: 16 U/L (ref 11–51)

## 2015-09-15 LAB — I-STAT TROPONIN, ED: TROPONIN I, POC: 0.01 ng/mL (ref 0.00–0.08)

## 2015-09-15 MED ORDER — HYDROCODONE-ACETAMINOPHEN 5-325 MG PO TABS: 1.0000 | ORAL_TABLET | Freq: Four times a day (QID) | ORAL | Status: DC | PRN

## 2015-09-15 MED ORDER — CEPHALEXIN 250 MG PO CAPS
1000.0000 mg | ORAL_CAPSULE | Freq: Once | ORAL | Status: AC
  Administered 2015-09-15: 1000 mg via ORAL
  Filled 2015-09-15: qty 4

## 2015-09-15 MED ORDER — IBUPROFEN 800 MG PO TABS: 800.0000 mg | ORAL_TABLET | Freq: Three times a day (TID) | ORAL | Status: DC

## 2015-09-15 MED ORDER — SODIUM CHLORIDE 0.9 % IV BOLUS (SEPSIS)
1000.0000 mL | Freq: Once | INTRAVENOUS | Status: AC
  Administered 2015-09-15: 1000 mL via INTRAVENOUS

## 2015-09-15 MED ORDER — IOHEXOL 350 MG/ML SOLN: 100.0000 mL | Freq: Once | INTRAVENOUS | Status: DC | PRN

## 2015-09-15 MED ORDER — OXYCODONE-ACETAMINOPHEN 5-325 MG PO TABS
2.0000 | ORAL_TABLET | Freq: Once | ORAL | Status: AC
  Administered 2015-09-15: 2 via ORAL
  Filled 2015-09-15: qty 2

## 2015-09-15 MED ORDER — MORPHINE SULFATE (PF) 4 MG/ML IV SOLN
4.0000 mg | Freq: Once | INTRAVENOUS | Status: AC
  Administered 2015-09-15: 4 mg via INTRAVENOUS
  Filled 2015-09-15: qty 1

## 2015-09-15 MED ORDER — IOHEXOL 300 MG/ML  SOLN
100.0000 mL | Freq: Once | INTRAMUSCULAR | Status: AC | PRN
  Administered 2015-09-15: 100 mL via INTRAVENOUS

## 2015-09-15 MED ORDER — CEPHALEXIN 500 MG PO CAPS: 1000.0000 mg | ORAL_CAPSULE | Freq: Two times a day (BID) | ORAL | Status: DC

## 2015-09-15 NOTE — ED Notes (Signed)
Patient transported to CT 

## 2015-09-15 NOTE — Discharge Instructions (Signed)
Take motrin for pain.   Take vicodin for severe pain. Do NOT drive with it.   You are expected to have pain for weeks.   Use incentive spirometer at least three times daily.   Take keflex for early pneumonia.   See your doctor.  Your blood sugar is slightly elevated and will need to be rechecked with your doctor.   Return to ER if you have severe pain, vomiting, dehydration, fever, worse cough, trouble breathing.

## 2015-09-15 NOTE — ED Notes (Signed)
Incentive spirometer provided with instructions. Pt demonstrated several breaths, c/o worsening L side pain

## 2015-09-15 NOTE — ED Notes (Addendum)
Pt was on 6 foot ladder that twisted causing him to fall onto ladder Thursday. Pt c/o left rib pain and shortness of breath. Pt also reports large amount of weight loss and that he has not had an appetite since the fall.

## 2015-09-15 NOTE — ED Notes (Signed)
MD at bedside. 

## 2015-09-15 NOTE — ED Provider Notes (Signed)
CSN: [id number]     Arrival date & time 09/15/15  1352 History   First MD Initiated Contact with Patient 09/15/15 1518     Chief Complaint  Patient presents with  . Fall  . rib pain   . Shortness of Breath     (Consider location/radiation/quality/duration/timing/severity/associated sxs/prior Treatment) The history is provided by the patient.  Gyan Cambre is a 55 y.o. male hx of DM, HL, HTN, heroin abuse here with fall. Patient states that he was up on a 6 foot ladder 3 days ago and accidentally fell and hit the left chest and abdomen. Has poor appetite afterwards and has pain when he takes a deep breath. Has been taking tylenol and motrin with no relief. He denies head injury or LOC.    Past Medical History  Diagnosis Date  . Heroin abuse   . Diabetes mellitus without complication ([place])   . Hyperlipidemia   . Hypertension    Past Surgical History  Procedure Laterality Date  . I&d extremity Right 03/20/2013    Procedure: IRRIGATION AND DEBRIDEMENT EXTREMITY;  Surgeon: Schuyler Amor, MD;  Location: [facility];  Service: Orthopedics;  Laterality: Right;   Family History  Problem Relation Age of Onset  . Diabetes Mother   . Heart disease Mother   . Hypertension Mother   . Stroke Mother   . Vision loss Mother   . Heart disease Father   . Hypertension Father    Social History  Substance Use Topics  . Smoking status: Current Every Day Smoker -- 1.10 packs/day for 11 years  . Smokeless tobacco: Never Used     Comment: Smoking .5-1 ppd  . Alcohol Use: 0.0 oz/week    0 Standard drinks or equivalent per week    Review of Systems  Respiratory: Positive for shortness of breath.   Gastrointestinal: Positive for abdominal pain.  All other systems reviewed and are negative.     Allergies  Review of patient's allergies indicates no known allergies.  Home Medications   Prior to Admission medications   Medication Sig Start Date End Date Taking? Authorizing Provider  aspirin EC  81 MG tablet Take 1 tablet (81 mg total) by mouth daily. 04/04/14  Yes Lance Bosch, NP  Cholecalciferol (VITAMIN D-3) 1000 UNITS CAPS Take 1,000 Units by mouth daily.    Yes Historical Provider, MD  gabapentin (NEURONTIN) 300 MG capsule Take 1 capsule (300 mg total) by mouth at bedtime. For diabetic nerve pain 06/11/15  Yes Lance Bosch, NP  glipiZIDE (GLUCOTROL) 10 MG tablet Take 10 mg by mouth daily before breakfast.   Yes Historical Provider, MD  Insulin Glargine (LANTUS SOLOSTAR) 100 UNIT/ML Solostar Pen Inject 24 Units into the skin daily at 10 pm. Patient taking differently: Inject 25 Units into the skin at bedtime.  06/12/15  Yes Tresa Garter, MD  meloxicam (MOBIC) 15 MG tablet Take 1 tablet (15 mg total) by mouth daily. 07/24/15  Yes Bryan R Hess, DO  metFORMIN (GLUCOPHAGE) 1000 MG tablet Take 1 tablet (1,000 mg total) by mouth 2 (two) times daily with a meal. 05/10/15  Yes Lance Bosch, NP  mirtazapine (REMERON) 15 MG tablet Take 1 tablet (15 mg total) by mouth at bedtime. Patient taking differently: Take 15 mg by mouth at bedtime as needed (sleep).  05/10/15  Yes Lance Bosch, NP  Multiple Vitamin (MULTIVITAMIN WITH MINERALS) TABS tablet Take 1 tablet by mouth daily.   Yes Historical Provider, MD  pravastatin (  PRAVACHOL) 20 MG tablet Take 1 tablet (20 mg total) by mouth daily. Patient taking differently: Take 20 mg by mouth at bedtime.  05/10/15  Yes Lance Bosch, NP  sildenafil (VIAGRA) 100 MG tablet Take 100 mg by mouth daily as needed for erectile dysfunction.   Yes Historical Provider, MD  clotrimazole-betamethasone (LOTRISONE) cream Apply 1 application topically 2 (two) times daily as needed (anal fissures).  07/29/15   Historical Provider, MD  glucose blood (TRUE METRIX BLOOD GLUCOSE TEST) test strip Check sugars 3 times per day 05/10/15   Lance Bosch, NP  glucose monitoring kit (FREESTYLE) monitoring kit 1 each by Does not apply route as needed for other. 12/27/13    Lance Bosch, NP  HYDROcodone-acetaminophen (NORCO/VICODIN) 5-325 MG tablet Take 1-2 tablets by mouth every 6 (six) hours as needed. Patient not taking: Reported on 09/15/2015 07/07/15   Gareth Morgan, MD  naproxen (NAPROSYN) 500 MG tablet Take 1 tablet (500 mg total) by mouth 2 (two) times daily with a meal. Patient not taking: Reported on 09/15/2015 07/07/15   Gareth Morgan, MD  traMADol (ULTRAM) 50 MG tablet Take 1 tablet (50 mg total) by mouth every 12 (twelve) hours as needed. Patient not taking: Reported on 09/15/2015 07/15/15   Lance Bosch, NP  TRUEPLUS LANCETS 28G MISC Check sugars 3 times per day 06/12/15   Tresa Garter, MD   BP 121/87 mmHg  Pulse 98  Temp(Src) 97.6 F (36.4 C) (Oral)  Resp 11  Ht '5\' 7"'$  (1.702 m)  Wt 105 lb (47.628 kg)  BMI 16.44 kg/m2  SpO2 97% Physical Exam  Constitutional: He is oriented to person, place, and time. He appears well-developed and well-nourished.  HENT:  Head: Normocephalic and atraumatic.  Mouth/Throat: Oropharynx is clear and moist.  Eyes: Conjunctivae are normal. Pupils are equal, round, and reactive to light.  Neck: Normal range of motion. Neck supple.  Cardiovascular: Normal rate, regular rhythm and normal heart sounds.   Pulmonary/Chest: Effort normal and breath sounds normal.  + mild tenderness L lower ribs, no obvious deformity. Crackles L base   Abdominal: Soft. Bowel sounds are normal.  + epigastric and LUQ and L flank tenderness, no obvious ecchymosis   Musculoskeletal: Normal range of motion. He exhibits no edema or tenderness.  Nl ROM bilateral hips. No obvious extremity trauma   Neurological: He is alert and oriented to person, place, and time.  Skin: Skin is warm and dry.  Psychiatric: He has a normal mood and affect. His behavior is normal. Judgment and thought content normal.  Nursing note and vitals reviewed.   ED Course  Procedures (including critical care time) Labs Review Labs Reviewed  CBC WITH  DIFFERENTIAL/PLATELET - Abnormal; Notable for the following:    Hemoglobin 12.6 (*)    HCT 36.6 (*)    MCV 77.9 (*)    All other components within normal limits  COMPREHENSIVE METABOLIC PANEL - Abnormal; Notable for the following:    Sodium 130 (*)    Chloride 95 (*)    CO2 20 (*)    Glucose, Bld 379 (*)    Calcium 8.8 (*)    Albumin 2.6 (*)    All other components within normal limits  CBG MONITORING, ED - Abnormal; Notable for the following:    Glucose-Capillary 255 (*)    All other components within normal limits  LIPASE, BLOOD  URINALYSIS, ROUTINE W REFLEX MICROSCOPIC (NOT AT [facility])  Randolm Idol, ED    Imaging Review  Dg Ribs Unilateral W/chest Left  09/15/2015  CLINICAL DATA:  55 year old male who fell while using a ladder three days ago. Left rib pain and shortness of Breath. Also reports unintentional weight loss. Initial encounter. EXAM: LEFT RIBS AND CHEST - 3+ VIEW COMPARISON:  Portable chest 12/12/2013 and earlier. FINDINGS: New 5-6 cm area of abnormal opacity at the left lung base best seen on the PA view (arrows). Stable left lung volumes since 2015. No superimposed pneumothorax, pulmonary edema or pleural effusion. Skin fold artifact suspected over the right upper lung. Mediastinal contours appear stable and within normal limits. Bone mineralization is within normal limits. Rib marker placed at the left anterior tenth rib level. No acute displaced rib fracture identified. Dystrophic calcification in the epigastrium most resembles chronic calcific pancreatitis. No acute osseous abnormality identified. IMPRESSION: 1. New 5-6 cm masslike opacity at the left lung base is nonspecific. In the setting of trauma this might be a pulmonary contusion. Tumor and pneumonia are the main differential considerations. Chest CT (IV contrast preferred) recommended to further characterize. 2. No displaced left rib fracture identified. 3. Evidence of chronic calcific pancreatitis. Electronically  Signed   By: Genevie Ann M.D.   On: 09/15/2015 15:26   Ct Chest W Contrast  09/15/2015  CLINICAL DATA:  55 year old male with left-sided flank and abdominal pain status post recent fall from a ladder EXAM: CT CHEST, ABDOMEN, AND PELVIS WITH CONTRAST TECHNIQUE: Multidetector CT imaging of the chest, abdomen and pelvis was performed following the standard protocol during bolus administration of intravenous contrast. CONTRAST:  141m OMNIPAQUE IOHEXOL 300 MG/ML  SOLN COMPARISON:  Chest radiograph dated 09/15/2015 FINDINGS: CT CHEST There is consolidative changes of the posterior aspect of the left lung base with an area of lower attenuation. In the setting of trauma findings are most compatible with focal pulmonary laceration. Underlying pulmonary mass is not excluded. Clinical correlation and follow-up recommended. There is small amount of loculated appearing pleural fluid at the left lung base likely an old hematoma. Small pockets of gas within this fluid collection as well as enhancement of the pleural surface may indicate superimposed infection. The remainder of the lungs are clear. There is no pleural effusion on the right. No pneumothorax. The central airways are patent. The thoracic aorta and central pulmonary arteries appear unremarkable. There is no cardiomegaly or pericardial effusion. No hilar or mediastinal adenopathy. Small calcified right hilar granuloma. The esophagus is grossly unremarkable. No thyroid nodule identified. There is no axillary adenopathy. There is loss of subcutaneous fat and cachexia. No acute fracture. CT ABDOMEN AND PELVIS No intra-abdominal free air or free fluid. There is heterogeneous enhancement of the liver. No discrete mass identified. The gallbladder appears unremarkable. There are scattered calcification of the pancreas sequela of chronic pancreatitis. There is mild dilatation of the main pancreatic duct. The spleen, adrenal glands, left kidney, left ureter, and urinary bladder  appear unremarkable. The prostate and seminal vesicles are grossly unremarkable. There is mild right hydronephrosis. There is moderate stool throughout the colon. No evidence of bowel obstruction or active inflammation. The appendix is not visualized with certainty. No inflammatory changes identified in the right lower quadrant. The thoracic aorta appears unremarkable. The origins of the celiac axis, SMA, IMA as well as the origins of the renal arteries are patent. No portal venous gas identified. There is no adenopathy. The abdominal wall soft tissues appear unremarkable. There is loss of subcutaneous fat and cachexia. Degenerative changes of the spine. No acute fracture. There is  mild sclerotic changes of the femoral heads which may be related to a degree of avascular necrosis. IMPRESSION: Consolidative changes of the posterior aspect of the left lung base with focal area of pulmonary laceration. A small loculated pleural fluid at the left lung base posteriorly, likely an old hematoma. Superimposed infection is not excluded. Clinical correlation and follow-up recommended. No rib fracture or pneumothorax. Mild right hydronephrosis. Cachexia. Electronically Signed   By: Anner Crete M.D.   On: 09/15/2015 19:24   Ct Abdomen Pelvis W Contrast  09/15/2015  CLINICAL DATA:  55 year old male with left-sided flank and abdominal pain status post recent fall from a ladder EXAM: CT CHEST, ABDOMEN, AND PELVIS WITH CONTRAST TECHNIQUE: Multidetector CT imaging of the chest, abdomen and pelvis was performed following the standard protocol during bolus administration of intravenous contrast. CONTRAST:  16m OMNIPAQUE IOHEXOL 300 MG/ML  SOLN COMPARISON:  Chest radiograph dated 09/15/2015 FINDINGS: CT CHEST There is consolidative changes of the posterior aspect of the left lung base with an area of lower attenuation. In the setting of trauma findings are most compatible with focal pulmonary laceration. Underlying pulmonary  mass is not excluded. Clinical correlation and follow-up recommended. There is small amount of loculated appearing pleural fluid at the left lung base likely an old hematoma. Small pockets of gas within this fluid collection as well as enhancement of the pleural surface may indicate superimposed infection. The remainder of the lungs are clear. There is no pleural effusion on the right. No pneumothorax. The central airways are patent. The thoracic aorta and central pulmonary arteries appear unremarkable. There is no cardiomegaly or pericardial effusion. No hilar or mediastinal adenopathy. Small calcified right hilar granuloma. The esophagus is grossly unremarkable. No thyroid nodule identified. There is no axillary adenopathy. There is loss of subcutaneous fat and cachexia. No acute fracture. CT ABDOMEN AND PELVIS No intra-abdominal free air or free fluid. There is heterogeneous enhancement of the liver. No discrete mass identified. The gallbladder appears unremarkable. There are scattered calcification of the pancreas sequela of chronic pancreatitis. There is mild dilatation of the main pancreatic duct. The spleen, adrenal glands, left kidney, left ureter, and urinary bladder appear unremarkable. The prostate and seminal vesicles are grossly unremarkable. There is mild right hydronephrosis. There is moderate stool throughout the colon. No evidence of bowel obstruction or active inflammation. The appendix is not visualized with certainty. No inflammatory changes identified in the right lower quadrant. The thoracic aorta appears unremarkable. The origins of the celiac axis, SMA, IMA as well as the origins of the renal arteries are patent. No portal venous gas identified. There is no adenopathy. The abdominal wall soft tissues appear unremarkable. There is loss of subcutaneous fat and cachexia. Degenerative changes of the spine. No acute fracture. There is mild sclerotic changes of the femoral heads which may be  related to a degree of avascular necrosis. IMPRESSION: Consolidative changes of the posterior aspect of the left lung base with focal area of pulmonary laceration. A small loculated pleural fluid at the left lung base posteriorly, likely an old hematoma. Superimposed infection is not excluded. Clinical correlation and follow-up recommended. No rib fracture or pneumothorax. Mild right hydronephrosis. Cachexia. Electronically Signed   By: AAnner CreteM.D.   On: 09/15/2015 19:24   I have personally reviewed and evaluated these images and lab results as part of my medical decision-making.   EKG Interpretation   Date/Time:  Sunday September 15 2015 16:05:15 EDT Ventricular Rate:  93 PR Interval:  140  QRS Duration: 96 QT Interval:  372 QTC Calculation: 463 R Axis:   81 Text Interpretation:  Sinus rhythm Right atrial enlargement No significant  change since last tracing Confirmed by YAO  MD, DAVID ([id number]) on 09/15/2015  4:58:51 PM      MDM   Final diagnoses:  None    Shonte Soderlund is a 55 y.o. male here with L rib pain, abdominal pain after injury. Will get rib series and likely get CT ab/pel to r/o splenic or renal injuries. Slightly tachycardic. Nurses noted bed bugs on him and had him take a shower.   8 pm Ct showed pulmonary laceration with hematoma. No intra abdominal process. Possible pneumonia. WBC nl. Not hypoxic. Not hypotensive. I consulted Dr. Donne Hazel from trauma. He states that injury is 3 days ago and will not need admission for monitoring. CBG 379 and has mild gap likely from dehydration. CBG down to 255 with NS 1 L bolus. He is taking his meds but [facility] with [facility]. Will dc home with incentive spirometry, empiric course of keflex for early pneumonia vs contusion, vicodin for pain.    Wandra Arthurs, MD 09/15/15 2145

## 2015-09-15 NOTE — ED Notes (Signed)
Pt given [place] sandwich and sprite to drink; CBG 255

## 2015-09-16 MED FILL — CEPHALEXIN 500 MG CAPSULE: 500 | 15 days supply | Qty: 30 | Fill #0

## 2015-09-16 MED FILL — HYDROCODON-APAP 5-325: 5-325 | 2 days supply | Qty: 10 | Fill #0

## 2015-09-16 MED FILL — IBUPROFEN 800 MG TABLET: 800 | 7 days supply | Qty: 21 | Fill #0

## 2015-09-26 ENCOUNTER — Ambulatory Visit: Payer: Self-pay | Attending: Internal Medicine | Admitting: Internal Medicine

## 2015-09-26 ENCOUNTER — Encounter: Payer: Self-pay | Admitting: Internal Medicine

## 2015-09-26 ENCOUNTER — Other Ambulatory Visit: Payer: Self-pay

## 2015-09-26 VITALS — BP 117/79 | HR 120 | Temp 97.7°F | Resp 16 | Ht 67.0 in | Wt 111.0 lb

## 2015-09-26 DIAGNOSIS — IMO0002 Reserved for concepts with insufficient information to code with codable children: Secondary | ICD-10-CM

## 2015-09-26 DIAGNOSIS — F17210 Nicotine dependence, cigarettes, uncomplicated: Secondary | ICD-10-CM | POA: Insufficient documentation

## 2015-09-26 DIAGNOSIS — I10 Essential (primary) hypertension: Secondary | ICD-10-CM | POA: Insufficient documentation

## 2015-09-26 DIAGNOSIS — R739 Hyperglycemia, unspecified: Secondary | ICD-10-CM

## 2015-09-26 DIAGNOSIS — E118 Type 2 diabetes mellitus with unspecified complications: Secondary | ICD-10-CM

## 2015-09-26 DIAGNOSIS — E1165 Type 2 diabetes mellitus with hyperglycemia: Secondary | ICD-10-CM | POA: Insufficient documentation

## 2015-09-26 DIAGNOSIS — B1920 Unspecified viral hepatitis C without hepatic coma: Secondary | ICD-10-CM

## 2015-09-26 DIAGNOSIS — Z794 Long term (current) use of insulin: Secondary | ICD-10-CM | POA: Insufficient documentation

## 2015-09-26 DIAGNOSIS — Z7982 Long term (current) use of aspirin: Secondary | ICD-10-CM | POA: Insufficient documentation

## 2015-09-26 DIAGNOSIS — Z79899 Other long term (current) drug therapy: Secondary | ICD-10-CM | POA: Insufficient documentation

## 2015-09-26 DIAGNOSIS — E785 Hyperlipidemia, unspecified: Secondary | ICD-10-CM | POA: Insufficient documentation

## 2015-09-26 DIAGNOSIS — Z87898 Personal history of other specified conditions: Secondary | ICD-10-CM | POA: Insufficient documentation

## 2015-09-26 DIAGNOSIS — R079 Chest pain, unspecified: Secondary | ICD-10-CM

## 2015-09-26 DIAGNOSIS — F1010 Alcohol abuse, uncomplicated: Secondary | ICD-10-CM

## 2015-09-26 DIAGNOSIS — R000 Tachycardia, unspecified: Secondary | ICD-10-CM

## 2015-09-26 LAB — POCT URINALYSIS DIPSTICK
Bilirubin, UA: NEGATIVE
Glucose, UA: 500
KETONES UA: NEGATIVE
Nitrite, UA: NEGATIVE
PH UA: 5.5
SPEC GRAV UA: 1.01
Urobilinogen, UA: 0.2

## 2015-09-26 LAB — GLUCOSE, POCT (MANUAL RESULT ENTRY)
POC GLUCOSE: 513 mg/dL — AB (ref 70–99)
POC Glucose: 576 mg/dl — AB (ref 70–99)
POC Glucose: 353 mg/dl — AB (ref 70–99)

## 2015-09-26 MED ORDER — INSULIN ASPART 100 UNIT/ML ~~LOC~~ SOLN
20.0000 [IU] | Freq: Once | SUBCUTANEOUS | Status: AC
  Administered 2015-09-26: 20 [IU] via SUBCUTANEOUS

## 2015-09-26 MED ORDER — INSULIN GLARGINE 100 UNIT/ML SOLOSTAR PEN: 25.0000 [IU] | PEN_INJECTOR | Freq: Every day | SUBCUTANEOUS | Status: DC

## 2015-09-26 MED FILL — !LANTUS SOLOSTAR 100UNITS/M: 100 | 30 days supply | Qty: 9 | Fill #0

## 2015-09-26 NOTE — Progress Notes (Signed)
Patient's here for ED f/up for lung contusion.   Patient reports that the pain in his chest is still painful. Rated 8/10 described as dull pain.  Patient c/o cough with greenish mucus, sneezing. feeling fatigue.  Patient requesting med refills.

## 2015-09-26 NOTE — Progress Notes (Signed)
Patient ID: Raymond Winters, male   DOB: [birth year], 55 y.o.   MRN: [id number]  CC: chest pain  HPI: Raymond Winters is a 55 y.o. male here today for a hospital follow upt.  Patient has past medical history of diabetes, HTN, heroin abuse, and HLD. Patient was evaluated in the ER one week ago after suffering a fall off of a 6 foot ladder. At the time he was complaining of rib pain and chest pain. Chest xray revealed a lung contusion and possibel pneumonia. Patient was treated appropriately at that time. Today he reports continued chest pain that is described as a dull ache. No radiation of chest pain or palpitations.  Patient states that he has not used any insulin in several days because he has had a hard time affording his medications. He has not been checking his sugars or trying to maintain a ADA diet during this time.  Patient states that he was diagnosed with Hepatitis C several years ago and has not been referred for treatment. He does continue to smoke and drink alcohol on a regular basis.   No Known Allergies Past Medical History  Diagnosis Date  . Heroin abuse   . Diabetes mellitus without complication ([place])   . Hyperlipidemia   . Hypertension    Current Outpatient Prescriptions on File Prior to Visit  Medication Sig Dispense Refill  . aspirin EC 81 MG tablet Take 1 tablet (81 mg total) by mouth daily. 30 tablet 5  . Cholecalciferol (VITAMIN D-3) 1000 UNITS CAPS Take 1,000 Units by mouth daily.     Marland Kitchen gabapentin (NEURONTIN) 300 MG capsule Take 1 capsule (300 mg total) by mouth at bedtime. For diabetic nerve pain 30 capsule 3  . glipiZIDE (GLUCOTROL) 10 MG tablet Take 10 mg by mouth daily before breakfast.    . glucose blood (TRUE METRIX BLOOD GLUCOSE TEST) test strip Check sugars 3 times per day 100 each 12  . glucose monitoring kit (FREESTYLE) monitoring kit 1 each by Does not apply route as needed for other. 1 each 0  . Insulin Glargine (LANTUS SOLOSTAR) 100 UNIT/ML Solostar Pen Inject 24 Units  into the skin daily at 10 pm. (Patient taking differently: Inject 25 Units into the skin at bedtime. ) 5 pen 10  . meloxicam (MOBIC) 15 MG tablet Take 1 tablet (15 mg total) by mouth daily. 30 tablet 3  . metFORMIN (GLUCOPHAGE) 1000 MG tablet Take 1 tablet (1,000 mg total) by mouth 2 (two) times daily with a meal. 60 tablet 4  . mirtazapine (REMERON) 15 MG tablet Take 1 tablet (15 mg total) by mouth at bedtime. (Patient taking differently: Take 15 mg by mouth at bedtime as needed (sleep). ) 30 tablet 2  . Multiple Vitamin (MULTIVITAMIN WITH MINERALS) TABS tablet Take 1 tablet by mouth daily.    . pravastatin (PRAVACHOL) 20 MG tablet Take 1 tablet (20 mg total) by mouth daily. (Patient taking differently: Take 20 mg by mouth at bedtime. ) 30 tablet 4  . sildenafil (VIAGRA) 100 MG tablet Take 100 mg by mouth daily as needed for erectile dysfunction.    . TRUEPLUS LANCETS 28G MISC Check sugars 3 times per day 100 each 12  . cephALEXin (KEFLEX) 500 MG capsule Take 2 capsules (1,000 mg total) by mouth 2 (two) times daily. (Patient not taking: Reported on 09/26/2015) 30 capsule 0  . clotrimazole-betamethasone (LOTRISONE) cream Apply 1 application topically 2 (two) times daily as needed (anal fissures). Reported on 09/26/2015  1  .  HYDROcodone-acetaminophen (NORCO/VICODIN) 5-325 MG tablet Take 1-2 tablets by mouth every 6 (six) hours as needed. (Patient not taking: Reported on 09/26/2015) 10 tablet 0  . ibuprofen (ADVIL,MOTRIN) 800 MG tablet Take 1 tablet (800 mg total) by mouth 3 (three) times daily. (Patient not taking: Reported on 09/26/2015) 21 tablet 0  . naproxen (NAPROSYN) 500 MG tablet Take 1 tablet (500 mg total) by mouth 2 (two) times daily with a meal. (Patient not taking: Reported on 09/15/2015) 30 tablet 0  . traMADol (ULTRAM) 50 MG tablet Take 1 tablet (50 mg total) by mouth every 12 (twelve) hours as needed. (Patient not taking: Reported on 09/15/2015) 30 tablet 0   No current facility-administered  medications on file prior to visit.   Family History  Problem Relation Age of Onset  . Diabetes Mother   . Heart disease Mother   . Hypertension Mother   . Stroke Mother   . Vision loss Mother   . Heart disease Father   . Hypertension Father    Social History   Social History  . Marital Status: Single    Spouse Name: N/A  . Number of Children: N/A  . Years of Education: N/A   Occupational History  . Not on file.   Social History Main Topics  . Smoking status: Current Every Day Smoker -- 1.10 packs/day for 11 years  . Smokeless tobacco: Never Used     Comment: Smoking .5-1 ppd  . Alcohol Use: 0.0 oz/week    0 Standard drinks or equivalent per week  . Drug Use: Yes    Special: IV     Comment: heroin  . Sexual Activity: Not on file   Other Topics Concern  . Not on file   Social History Narrative    Review of Systems: Other than what is stated in HPI, all other systems are negative.   Objective:   Filed Vitals:   09/26/15 1418  BP: 117/79  Pulse: 120  Temp: 97.7 F (36.5 C)  Resp: 16    Physical Exam  Constitutional: He is oriented to person, place, and time.  Severely underweight.   Cardiovascular: Regular rhythm.   Sinus tachycardia  Pulmonary/Chest: Effort normal and breath sounds normal. He exhibits tenderness (left rib and left chest).  Abdominal: Soft. Bowel sounds are normal.  Musculoskeletal:  Some left rib tenderness  Neurological: He is alert and oriented to person, place, and time.  Skin: Skin is warm and dry.  Psychiatric: He has a normal mood and affect.     Lab Results  Component Value Date   WBC 8.6 09/15/2015   HGB 12.6* 09/15/2015   HCT 36.6* 09/15/2015   MCV 77.9* 09/15/2015   PLT 248 09/15/2015   Lab Results  Component Value Date   CREATININE 0.89 09/15/2015   BUN 6 09/15/2015   NA 130* 09/15/2015   K 4.4 09/15/2015   CL 95* 09/15/2015   CO2 20* 09/15/2015    Lab Results  Component Value Date   HGBA1C 12.70  07/15/2015   Lipid Panel     Component Value Date/Time   CHOL 86 01/03/2014 0948   TRIG 149 01/03/2014 0948   HDL 40 01/03/2014 0948   CHOLHDL 2.2 01/03/2014 0948   VLDL 30 01/03/2014 0948   LDLCALC 16 01/03/2014 0948       Assessment and plan:   Raymond Winters was seen today for follow-up and chest injury.  Diagnoses and all orders for this visit:  Uncontrolled type 2 diabetes mellitus  with complication, with long-term current use of insulin ([facility]) -     POCT glucose (manual entry) -     insulin aspart (novoLOG) injection 20 Units; Inject 0.2 mLs (20 Units total) into the skin once. -     Glucose (CBG) -     Insulin Glargine (LANTUS SOLOSTAR) 100 UNIT/ML Solostar Pen; Inject 25 Units into the skin at bedtime. -     Glucose (CBG) Patients diabetes remains uncontrolled as evidence by hemoglobin a1c >8.  Patient has been non-compliant with medication regimen. Stressed the multiple complications associated with uncontrolled diabetes.  Patient will stay on current medication dose and report back to clinic with cbg log in 2 weeks.  Hyperglycemia -     POCT urinalysis dipstick IV inserted and 1 L NS given in office as well as 20 units of insulin. Completed to prevent ER use due to severe hyperglycemia.   Chest pain, unspecified chest pain type -     EKG 12-Lead EKG: sinus tachycardia. Patient looks dehydrated on exam and may be a possible cause of tachycardia. Also a possibility of drug induced Tachycardia. Patient unable to give a exact date of last heroin use.   Sinus tachycardia ([place]) See above  Hepatitis C virus infection without hepatic coma, unspecified chronicity -     Ambulatory referral to Infectious Disease Explained that alcohol and drug use will need to stop before he is even considered for treatment.   Alcohol abuse Alcohol cessation discussed for 5 minutes and long term complications. Explained that alcohol can raise his sugars and cause more complications  Total time spent  with patient was 45 minutes. > 50% spent counseling and coordination care with patient.     Return in about 2 weeks (around 10/10/2015) for [place]-log review.   Lance Bosch, [facility] [phone] 09/26/2015, 3:07 PM

## 2015-09-27 ENCOUNTER — Telehealth: Payer: Self-pay | Admitting: Internal Medicine

## 2015-09-27 ENCOUNTER — Other Ambulatory Visit: Payer: Self-pay | Admitting: Internal Medicine

## 2015-09-27 MED FILL — ?METFORMIN HCL 1,000 MG TAB: 1000 | 30 days supply | Qty: 60 | Fill #2

## 2015-09-27 MED FILL — GABAPENTIN 300 MG CAPSULE: 300 | 30 days supply | Qty: 30 | Fill #2

## 2015-09-27 MED FILL — PRAVASTATIN NA 20 MG TAB: 20 | 30 days supply | Qty: 30 | Fill #3

## 2015-09-27 MED FILL — MELOXICAM 15 MG TABLET: 15 | 30 days supply | Qty: 30 | Fill #2

## 2015-09-27 NOTE — Telephone Encounter (Signed)
Patient called stating that during office visit it was discussed that he would receive pain meds for broken ribs, However he was not prescribed any. Please follow up

## 2015-09-27 NOTE — Telephone Encounter (Signed)
Patients sister called to ask PCP what new medications were prescribed. Please f/u

## 2015-09-30 NOTE — Telephone Encounter (Signed)
Pt. Called stating that over his OV he was going to be prescribed the following medications:  ibuprofen (ADVIL,MOTRIN) 800 MG tablet traMADol (ULTRAM) 50 MG tablet   The pt. stated he was not prescribed neither of the medications. Please f/u

## 2015-10-02 NOTE — Telephone Encounter (Signed)
No Tramadol 

## 2015-10-03 NOTE — Telephone Encounter (Signed)
CMA called patient, patient did not answer. Patient voice message did not pick up. I couldn't leave a message.  Message to patient:  Please tell patient that Tramadol will not be filled but can fill ibuprofen.

## 2015-10-07 ENCOUNTER — Other Ambulatory Visit: Payer: Self-pay

## 2015-10-07 MED ORDER — IBUPROFEN 800 MG PO TABS: 800.0000 mg | ORAL_TABLET | Freq: Three times a day (TID) | ORAL | Status: DC

## 2015-10-07 NOTE — Progress Notes (Unsigned)
Tried to reach patient, patient didn't answer phone kept ringing w/ no pickup to leave a VM.  Note to patient:  Please let patient know that his ibuprofen was sent over to our Pharmacy. Available for pick up tomorrow, 4/18. Vikki PortsValerie decline to fill his Tramadol.

## 2015-10-09 NOTE — Telephone Encounter (Signed)
Tried to call patient. Patient phone kept ringing with no VM pickup.

## 2015-10-16 ENCOUNTER — Ambulatory Visit: Payer: Self-pay | Admitting: Pharmacist

## 2015-10-17 ENCOUNTER — Ambulatory Visit: Payer: Self-pay | Admitting: Pharmacist

## 2015-12-24 ENCOUNTER — Emergency Department (HOSPITAL_COMMUNITY): Payer: Self-pay

## 2015-12-24 ENCOUNTER — Encounter (HOSPITAL_COMMUNITY)
Admission: EM | Disposition: A | Discharge status: Home / Self Care | Payer: Self-pay | Source: Home / Self Care | Attending: Family Medicine

## 2015-12-24 ENCOUNTER — Emergency Department (HOSPITAL_COMMUNITY): Payer: Self-pay | Admitting: Anesthesiology

## 2015-12-24 ENCOUNTER — Encounter (HOSPITAL_COMMUNITY): Payer: Self-pay | Admitting: Emergency Medicine

## 2015-12-24 ENCOUNTER — Inpatient Hospital Stay (HOSPITAL_COMMUNITY)
Admission: EM | Admit: 2015-12-24 | Discharge: 2015-12-31 | DRG: 573 | Disposition: A | Discharge status: Home / Self Care | Payer: Self-pay | Attending: Family Medicine | Admitting: Family Medicine

## 2015-12-24 DIAGNOSIS — L0291 Cutaneous abscess, unspecified: Secondary | ICD-10-CM | POA: Diagnosis present

## 2015-12-24 DIAGNOSIS — F1410 Cocaine abuse, uncomplicated: Secondary | ICD-10-CM | POA: Diagnosis present

## 2015-12-24 DIAGNOSIS — Z681 Body mass index (BMI) 19 or less, adult: Secondary | ICD-10-CM

## 2015-12-24 DIAGNOSIS — L02414 Cutaneous abscess of left upper limb: Principal | ICD-10-CM | POA: Insufficient documentation

## 2015-12-24 DIAGNOSIS — F1990 Other psychoactive substance use, unspecified, uncomplicated: Secondary | ICD-10-CM | POA: Diagnosis present

## 2015-12-24 DIAGNOSIS — R609 Edema, unspecified: Secondary | ICD-10-CM | POA: Insufficient documentation

## 2015-12-24 DIAGNOSIS — Z79899 Other long term (current) drug therapy: Secondary | ICD-10-CM

## 2015-12-24 DIAGNOSIS — M009 Pyogenic arthritis, unspecified: Secondary | ICD-10-CM | POA: Insufficient documentation

## 2015-12-24 DIAGNOSIS — E785 Hyperlipidemia, unspecified: Secondary | ICD-10-CM | POA: Diagnosis present

## 2015-12-24 DIAGNOSIS — E860 Dehydration: Secondary | ICD-10-CM | POA: Diagnosis present

## 2015-12-24 DIAGNOSIS — E1165 Type 2 diabetes mellitus with hyperglycemia: Secondary | ICD-10-CM | POA: Diagnosis present

## 2015-12-24 DIAGNOSIS — Z794 Long term (current) use of insulin: Secondary | ICD-10-CM

## 2015-12-24 DIAGNOSIS — B1920 Unspecified viral hepatitis C without hepatic coma: Secondary | ICD-10-CM | POA: Diagnosis present

## 2015-12-24 DIAGNOSIS — F1110 Opioid abuse, uncomplicated: Secondary | ICD-10-CM | POA: Diagnosis present

## 2015-12-24 DIAGNOSIS — E11649 Type 2 diabetes mellitus with hypoglycemia without coma: Secondary | ICD-10-CM | POA: Diagnosis not present

## 2015-12-24 DIAGNOSIS — F17210 Nicotine dependence, cigarettes, uncomplicated: Secondary | ICD-10-CM | POA: Diagnosis present

## 2015-12-24 DIAGNOSIS — E43 Unspecified severe protein-calorie malnutrition: Secondary | ICD-10-CM | POA: Diagnosis present

## 2015-12-24 DIAGNOSIS — E118 Type 2 diabetes mellitus with unspecified complications: Secondary | ICD-10-CM | POA: Insufficient documentation

## 2015-12-24 DIAGNOSIS — D72829 Elevated white blood cell count, unspecified: Secondary | ICD-10-CM

## 2015-12-24 DIAGNOSIS — M25512 Pain in left shoulder: Secondary | ICD-10-CM | POA: Diagnosis present

## 2015-12-24 DIAGNOSIS — Z7982 Long term (current) use of aspirin: Secondary | ICD-10-CM

## 2015-12-24 DIAGNOSIS — I10 Essential (primary) hypertension: Secondary | ICD-10-CM | POA: Diagnosis present

## 2015-12-24 DIAGNOSIS — E871 Hypo-osmolality and hyponatremia: Secondary | ICD-10-CM | POA: Diagnosis present

## 2015-12-24 DIAGNOSIS — D62 Acute posthemorrhagic anemia: Secondary | ICD-10-CM | POA: Diagnosis not present

## 2015-12-24 DIAGNOSIS — R739 Hyperglycemia, unspecified: Secondary | ICD-10-CM

## 2015-12-24 HISTORY — DX: Major depressive disorder, single episode, unspecified: F32.9

## 2015-12-24 HISTORY — PX: I&D EXTREMITY: SHX5045

## 2015-12-24 HISTORY — PX: APPLICATION OF WOUND VAC: SHX5189

## 2015-12-24 HISTORY — DX: Depression, unspecified: F32.A

## 2015-12-24 LAB — CBC WITH DIFFERENTIAL/PLATELET
Basophils Absolute: 0 10*3/uL (ref 0.0–0.1)
Basophils Relative: 0 %
Eosinophils Absolute: 0 10*3/uL (ref 0.0–0.7)
Eosinophils Relative: 0 %
HEMATOCRIT: 34.2 % — AB (ref 39.0–52.0)
HEMOGLOBIN: 11.6 g/dL — AB (ref 13.0–17.0)
LYMPHS ABS: 1.3 10*3/uL (ref 0.7–4.0)
LYMPHS PCT: 10 %
MCH: 26.6 pg (ref 26.0–34.0)
MCHC: 33.9 g/dL (ref 30.0–36.0)
MCV: 78.4 fL (ref 78.0–100.0)
MONO ABS: 1 10*3/uL (ref 0.1–1.0)
MONOS PCT: 8 %
NEUTROS ABS: 10.7 10*3/uL — AB (ref 1.7–7.7)
NEUTROS PCT: 82 %
Platelets: 229 10*3/uL (ref 150–400)
RBC: 4.36 MIL/uL (ref 4.22–5.81)
RDW: 12.9 % (ref 11.5–15.5)
WBC: 13 10*3/uL — ABNORMAL HIGH (ref 4.0–10.5)

## 2015-12-24 LAB — GLUCOSE, CAPILLARY
GLUCOSE-CAPILLARY: 255 mg/dL — AB (ref 65–99)
GLUCOSE-CAPILLARY: 229 mg/dL — AB (ref 65–99)

## 2015-12-24 LAB — I-STAT CG4 LACTIC ACID, ED
LACTIC ACID, VENOUS: 2.67 mmol/L — AB (ref 0.5–1.9)
Lactic Acid, Venous: 2.51 mmol/L (ref 0.5–1.9)

## 2015-12-24 LAB — I-STAT CHEM 8, ED
BUN: 17 mg/dL (ref 6–20)
CALCIUM ION: 1.04 mmol/L — AB (ref 1.13–1.30)
CHLORIDE: 90 mmol/L — AB (ref 101–111)
Creatinine, Ser: 1.1 mg/dL (ref 0.61–1.24)
GLUCOSE: 306 mg/dL — AB (ref 65–99)
HCT: 37 % — ABNORMAL LOW (ref 39.0–52.0)
Hemoglobin: 12.6 g/dL — ABNORMAL LOW (ref 13.0–17.0)
POTASSIUM: 4 mmol/L (ref 3.5–5.1)
Sodium: 127 mmol/L — ABNORMAL LOW (ref 135–145)
TCO2: 22 mmol/L (ref 0–100)

## 2015-12-24 LAB — RAPID URINE DRUG SCREEN, HOSP PERFORMED
Amphetamines: NOT DETECTED
Barbiturates: NOT DETECTED
Benzodiazepines: NOT DETECTED
Cocaine: POSITIVE — AB
OPIATES: POSITIVE — AB
TETRAHYDROCANNABINOL: NOT DETECTED

## 2015-12-24 LAB — SEDIMENTATION RATE: SED RATE: 50 mm/h — AB (ref 0–16)

## 2015-12-24 LAB — C-REACTIVE PROTEIN: CRP: 11.6 mg/dL — ABNORMAL HIGH (ref <1.0)

## 2015-12-24 LAB — PROTIME-INR
INR: 1.42 (ref 0.00–1.49)
Prothrombin Time: 17.5 seconds — ABNORMAL HIGH (ref 11.6–15.2)

## 2015-12-24 SURGERY — IRRIGATION AND DEBRIDEMENT EXTREMITY
Anesthesia: General | Site: Shoulder | Laterality: Left

## 2015-12-24 MED ORDER — GABAPENTIN 300 MG PO CAPS
300.0000 mg | ORAL_CAPSULE | Freq: Every day | ORAL | Status: DC
  Administered 2015-12-24 – 2015-12-30 (×7): 300 mg via ORAL
  Filled 2015-12-24 (×7): qty 1

## 2015-12-24 MED ORDER — HYDROMORPHONE HCL 1 MG/ML IJ SOLN
INTRAMUSCULAR | Status: AC
  Filled 2015-12-24: qty 1

## 2015-12-24 MED ORDER — SODIUM CHLORIDE 0.9 % IR SOLN
Status: DC | PRN
  Administered 2015-12-24 (×2): 3000 mL

## 2015-12-24 MED ORDER — INSULIN GLARGINE 100 UNIT/ML ~~LOC~~ SOLN
15.0000 [IU] | Freq: Every day | SUBCUTANEOUS | Status: DC
  Administered 2015-12-25 – 2015-12-26 (×3): 15 [IU] via SUBCUTANEOUS
  Filled 2015-12-24 (×4): qty 0.15

## 2015-12-24 MED ORDER — KETAMINE HCL 100 MG/ML IJ SOLN
INTRAMUSCULAR | Status: AC
  Filled 2015-12-24: qty 1

## 2015-12-24 MED ORDER — ONDANSETRON HCL 4 MG/2ML IJ SOLN
INTRAMUSCULAR | Status: AC
  Filled 2015-12-24: qty 2

## 2015-12-24 MED ORDER — SODIUM CHLORIDE 0.9 % IV BOLUS (SEPSIS)
1000.0000 mL | Freq: Once | INTRAVENOUS | Status: AC
  Administered 2015-12-24: 1000 mL via INTRAVENOUS

## 2015-12-24 MED ORDER — PROPOFOL 10 MG/ML IV BOLUS
INTRAVENOUS | Status: AC
  Filled 2015-12-24: qty 20

## 2015-12-24 MED ORDER — PIPERACILLIN-TAZOBACTAM 3.375 G IVPB 30 MIN
3.3750 g | Freq: Once | INTRAVENOUS | Status: AC
  Administered 2015-12-24: 3.375 g via INTRAVENOUS
  Filled 2015-12-24: qty 50

## 2015-12-24 MED ORDER — SODIUM CHLORIDE 0.9 % IR SOLN
Status: DC | PRN
  Administered 2015-12-24: 1000 mL

## 2015-12-24 MED ORDER — HYDROMORPHONE HCL 1 MG/ML IJ SOLN
0.2500 mg | INTRAMUSCULAR | Status: DC | PRN
  Administered 2015-12-24 (×4): 0.5 mg via INTRAVENOUS

## 2015-12-24 MED ORDER — VANCOMYCIN HCL 500 MG IV SOLR
500.0000 mg | Freq: Two times a day (BID) | INTRAVENOUS | Status: DC
  Administered 2015-12-25 – 2015-12-28 (×8): 500 mg via INTRAVENOUS
  Filled 2015-12-24 (×12): qty 500

## 2015-12-24 MED ORDER — INSULIN ASPART 100 UNIT/ML ~~LOC~~ SOLN
6.0000 [IU] | Freq: Once | SUBCUTANEOUS | Status: AC
  Administered 2015-12-24: 6 [IU] via SUBCUTANEOUS

## 2015-12-24 MED ORDER — INSULIN ASPART 100 UNIT/ML ~~LOC~~ SOLN
SUBCUTANEOUS | Status: AC
  Filled 2015-12-24: qty 6

## 2015-12-24 MED ORDER — SODIUM CHLORIDE 0.9 % IV SOLN
INTRAVENOUS | Status: AC
  Administered 2015-12-24: 23:00:00 via INTRAVENOUS

## 2015-12-24 MED ORDER — INSULIN ASPART 100 UNIT/ML ~~LOC~~ SOLN
SUBCUTANEOUS | Status: AC
Start: 2015-12-24 — End: 2015-12-25
  Filled 2015-12-24: qty 8

## 2015-12-24 MED ORDER — SODIUM CHLORIDE 0.9 % IV SOLN
1000.0000 mg | INTRAVENOUS | Status: DC | PRN
  Administered 2015-12-24: 1000 mg via INTRAVENOUS

## 2015-12-24 MED ORDER — INSULIN ASPART 100 UNIT/ML ~~LOC~~ SOLN
8.0000 [IU] | Freq: Once | SUBCUTANEOUS | Status: AC
  Administered 2015-12-24: 8 [IU] via SUBCUTANEOUS

## 2015-12-24 MED ORDER — LORAZEPAM 1 MG PO TABS: 1.0000 mg | ORAL_TABLET | Freq: Four times a day (QID) | ORAL | Status: AC | PRN

## 2015-12-24 MED ORDER — HYDROMORPHONE HCL 1 MG/ML IJ SOLN
0.5000 mg | INTRAMUSCULAR | Status: DC | PRN
  Administered 2015-12-24: 0.5 mg via INTRAVENOUS
  Filled 2015-12-24: qty 1

## 2015-12-24 MED ORDER — PHENYLEPHRINE 40 MCG/ML (10ML) SYRINGE FOR IV PUSH (FOR BLOOD PRESSURE SUPPORT)
PREFILLED_SYRINGE | INTRAVENOUS | Status: AC
  Filled 2015-12-24: qty 10

## 2015-12-24 MED ORDER — SUCCINYLCHOLINE CHLORIDE 20 MG/ML IJ SOLN
INTRAMUSCULAR | Status: DC | PRN
  Administered 2015-12-24: 100 mg via INTRAVENOUS

## 2015-12-24 MED ORDER — THIAMINE HCL 100 MG/ML IJ SOLN
100.0000 mg | Freq: Every day | INTRAMUSCULAR | Status: DC
  Filled 2015-12-24: qty 2

## 2015-12-24 MED ORDER — PIPERACILLIN-TAZOBACTAM 3.375 G IVPB
3.3750 g | Freq: Three times a day (TID) | INTRAVENOUS | Status: DC
  Administered 2015-12-25 – 2015-12-27 (×8): 3.375 g via INTRAVENOUS
  Filled 2015-12-24 (×9): qty 50

## 2015-12-24 MED ORDER — ROCURONIUM BROMIDE 50 MG/5ML IV SOLN
INTRAVENOUS | Status: AC
  Filled 2015-12-24: qty 1

## 2015-12-24 MED ORDER — MIDAZOLAM HCL 2 MG/2ML IJ SOLN
INTRAMUSCULAR | Status: AC
  Filled 2015-12-24: qty 2

## 2015-12-24 MED ORDER — PROMETHAZINE HCL 25 MG/ML IJ SOLN: 6.2500 mg | INTRAMUSCULAR | Status: DC | PRN

## 2015-12-24 MED ORDER — SODIUM CHLORIDE 0.9 % IV SOLN
Freq: Once | INTRAVENOUS | Status: AC
  Administered 2015-12-24: 19:00:00 via INTRAVENOUS

## 2015-12-24 MED ORDER — LORAZEPAM 2 MG/ML IJ SOLN
1.0000 mg | Freq: Four times a day (QID) | INTRAMUSCULAR | Status: AC | PRN
  Administered 2015-12-27: 1 mg via INTRAVENOUS
  Filled 2015-12-24: qty 1

## 2015-12-24 MED ORDER — FOLIC ACID 1 MG PO TABS
1.0000 mg | ORAL_TABLET | Freq: Every day | ORAL | Status: DC
  Administered 2015-12-25 – 2015-12-31 (×5): 1 mg via ORAL
  Filled 2015-12-24 (×5): qty 1

## 2015-12-24 MED ORDER — IOPAMIDOL (ISOVUE-300) INJECTION 61%
INTRAVENOUS | Status: AC
  Administered 2015-12-24: 75 mL via INTRAVENOUS
  Filled 2015-12-24: qty 75

## 2015-12-24 MED ORDER — ADULT MULTIVITAMIN W/MINERALS CH
1.0000 | ORAL_TABLET | Freq: Every day | ORAL | Status: DC
  Administered 2015-12-25 – 2015-12-31 (×5): 1 via ORAL
  Filled 2015-12-24 (×5): qty 1

## 2015-12-24 MED ORDER — INSULIN ASPART 100 UNIT/ML ~~LOC~~ SOLN
0.0000 [IU] | Freq: Three times a day (TID) | SUBCUTANEOUS | Status: DC
  Administered 2015-12-25: 1 [IU] via SUBCUTANEOUS
  Administered 2015-12-25: 3 [IU] via SUBCUTANEOUS
  Administered 2015-12-26: 5 [IU] via SUBCUTANEOUS
  Administered 2015-12-26: 3 [IU] via SUBCUTANEOUS
  Administered 2015-12-26: 2 [IU] via SUBCUTANEOUS
  Administered 2015-12-27: 5 [IU] via SUBCUTANEOUS
  Administered 2015-12-28: 2 [IU] via SUBCUTANEOUS
  Administered 2015-12-28 – 2015-12-29 (×2): 3 [IU] via SUBCUTANEOUS
  Administered 2015-12-29: 5 [IU] via SUBCUTANEOUS
  Administered 2015-12-29: 3 [IU] via SUBCUTANEOUS
  Administered 2015-12-31: 5 [IU] via SUBCUTANEOUS
  Administered 2015-12-31: 2 [IU] via SUBCUTANEOUS

## 2015-12-24 MED ORDER — VANCOMYCIN HCL IN DEXTROSE 1-5 GM/200ML-% IV SOLN
INTRAVENOUS | Status: AC
  Filled 2015-12-24: qty 200

## 2015-12-24 MED ORDER — INSULIN ASPART 100 UNIT/ML ~~LOC~~ SOLN
0.0000 [IU] | Freq: Every day | SUBCUTANEOUS | Status: DC
  Administered 2015-12-25: 2 [IU] via SUBCUTANEOUS
  Administered 2015-12-26 – 2015-12-27 (×2): 5 [IU] via SUBCUTANEOUS
  Administered 2015-12-28: 3 [IU] via SUBCUTANEOUS
  Administered 2015-12-29: 2 [IU] via SUBCUTANEOUS

## 2015-12-24 MED ORDER — FENTANYL CITRATE (PF) 100 MCG/2ML IJ SOLN
INTRAMUSCULAR | Status: DC | PRN
  Administered 2015-12-24 (×3): 50 ug via INTRAVENOUS

## 2015-12-24 MED ORDER — ENOXAPARIN SODIUM 40 MG/0.4ML ~~LOC~~ SOLN
40.0000 mg | SUBCUTANEOUS | Status: AC
  Administered 2015-12-25: 40 mg via SUBCUTANEOUS
  Filled 2015-12-24: qty 0.4

## 2015-12-24 MED ORDER — LIDOCAINE HCL (CARDIAC) 20 MG/ML IV SOLN
INTRAVENOUS | Status: DC | PRN
  Administered 2015-12-24: 90 mg via INTRAVENOUS

## 2015-12-24 MED ORDER — MIDAZOLAM HCL 5 MG/5ML IJ SOLN
INTRAMUSCULAR | Status: DC | PRN
  Administered 2015-12-24: 2 mg via INTRAVENOUS

## 2015-12-24 MED ORDER — PROPOFOL 10 MG/ML IV BOLUS
INTRAVENOUS | Status: DC | PRN
  Administered 2015-12-24: 150 mg via INTRAVENOUS

## 2015-12-24 MED ORDER — FENTANYL CITRATE (PF) 250 MCG/5ML IJ SOLN
INTRAMUSCULAR | Status: AC
  Filled 2015-12-24: qty 5

## 2015-12-24 MED ORDER — ONDANSETRON HCL 4 MG/2ML IJ SOLN
INTRAMUSCULAR | Status: DC | PRN
  Administered 2015-12-24: 4 mg via INTRAVENOUS

## 2015-12-24 MED ORDER — HYDROMORPHONE HCL 1 MG/ML IJ SOLN
1.0000 mg | Freq: Once | INTRAMUSCULAR | Status: AC
  Administered 2015-12-24: 1 mg via INTRAMUSCULAR
  Filled 2015-12-24: qty 1

## 2015-12-24 MED ORDER — LACTATED RINGERS IV SOLN
INTRAVENOUS | Status: DC | PRN
  Administered 2015-12-24: 20:00:00 via INTRAVENOUS

## 2015-12-24 MED ORDER — VITAMIN B-1 100 MG PO TABS
100.0000 mg | ORAL_TABLET | Freq: Every day | ORAL | Status: DC
  Administered 2015-12-25 – 2015-12-31 (×5): 100 mg via ORAL
  Filled 2015-12-24 (×5): qty 1

## 2015-12-24 SURGICAL SUPPLY — 66 items
BANDAGE ELASTIC 3 VELCRO ST LF (GAUZE/BANDAGES/DRESSINGS) IMPLANT
BLADE SURG 10 STRL SS (BLADE) ×3 IMPLANT
BNDG COHESIVE 1X5 TAN STRL LF (GAUZE/BANDAGES/DRESSINGS) IMPLANT
BNDG COHESIVE 4X5 TAN STRL (GAUZE/BANDAGES/DRESSINGS) ×1 IMPLANT
BNDG COHESIVE 6X5 TAN STRL LF (GAUZE/BANDAGES/DRESSINGS) ×2 IMPLANT
BNDG CONFORM 3 STRL LF (GAUZE/BANDAGES/DRESSINGS) IMPLANT
BNDG GAUZE STRTCH 6 (GAUZE/BANDAGES/DRESSINGS) ×3 IMPLANT
CANISTER WOUND CARE 500ML ATS (WOUND CARE) ×2 IMPLANT
CORDS BIPOLAR (ELECTRODE) IMPLANT
COVER SURGICAL LIGHT HANDLE (MISCELLANEOUS) ×3 IMPLANT
CUFF TOURNIQUET SINGLE 24IN (TOURNIQUET CUFF) IMPLANT
CUFF TOURNIQUET SINGLE 34IN LL (TOURNIQUET CUFF) ×2 IMPLANT
CUFF TOURNIQUET SINGLE 44IN (TOURNIQUET CUFF) IMPLANT
DRAPE EXTREMITY BILATERAL (DRAPES) IMPLANT
DRAPE IMP U-DRAPE 54X76 (DRAPES) IMPLANT
DRAPE INCISE IOBAN 66X45 STRL (DRAPES) ×8 IMPLANT
DRAPE SURG 17X23 STRL (DRAPES) IMPLANT
DRAPE U-SHAPE 47X51 STRL (DRAPES) ×3 IMPLANT
DRESSING ALLEVYN LIFE SACRUM (GAUZE/BANDAGES/DRESSINGS) ×2 IMPLANT
DRSG VAC ATS LRG SENSATRAC (GAUZE/BANDAGES/DRESSINGS) ×2 IMPLANT
DURAPREP 26ML APPLICATOR (WOUND CARE) ×3 IMPLANT
ELECT CAUTERY BLADE 6.4 (BLADE) ×3 IMPLANT
ELECT REM PT RETURN 9FT ADLT (ELECTROSURGICAL)
ELECTRODE REM PT RTRN 9FT ADLT (ELECTROSURGICAL) IMPLANT
FACESHIELD WRAPAROUND (MASK) IMPLANT
GAUZE SPONGE 4X4 12PLY STRL (GAUZE/BANDAGES/DRESSINGS) ×2 IMPLANT
GAUZE XEROFORM 1X8 LF (GAUZE/BANDAGES/DRESSINGS) ×1 IMPLANT
GAUZE XEROFORM 5X9 LF (GAUZE/BANDAGES/DRESSINGS) ×1 IMPLANT
GLOVE ECLIPSE 7.5 STRL STRAW (GLOVE) ×2 IMPLANT
GLOVE SKINSENSE NS SZ7.5 (GLOVE) ×4
GLOVE SKINSENSE STRL SZ7.5 (GLOVE) ×2 IMPLANT
GOWN STRL REIN XL XLG (GOWN DISPOSABLE) ×4 IMPLANT
HANDPIECE INTERPULSE COAX TIP (DISPOSABLE)
KIT BASIN OR (CUSTOM PROCEDURE TRAY) ×3 IMPLANT
KIT ROOM TURNOVER OR (KITS) ×3 IMPLANT
MANIFOLD NEPTUNE II (INSTRUMENTS) ×3 IMPLANT
NDL 18GX1X1/2 (RX/OR ONLY) (NEEDLE) IMPLANT
NEEDLE 18GX1X1/2 (RX/OR ONLY) (NEEDLE) ×3 IMPLANT
NS IRRIG 1000ML POUR BTL (IV SOLUTION) ×6 IMPLANT
PACK ORTHO EXTREMITY (CUSTOM PROCEDURE TRAY) ×3 IMPLANT
PAD ABD 8X10 STRL (GAUZE/BANDAGES/DRESSINGS) ×3 IMPLANT
PAD ARMBOARD 7.5X6 YLW CONV (MISCELLANEOUS) ×6 IMPLANT
PADDING CAST ABS 4INX4YD NS (CAST SUPPLIES) ×4
PADDING CAST ABS COTTON 4X4 ST (CAST SUPPLIES) ×2 IMPLANT
PADDING CAST COTTON 6X4 STRL (CAST SUPPLIES) ×1 IMPLANT
SET HNDPC FAN SPRY TIP SCT (DISPOSABLE) IMPLANT
SHEET MEDIUM DRAPE 40X70 STRL (DRAPES) ×2 IMPLANT
SPONGE LAP 18X18 X RAY DECT (DISPOSABLE) ×6 IMPLANT
STOCKINETTE IMPERVIOUS 9X36 MD (GAUZE/BANDAGES/DRESSINGS) ×3 IMPLANT
SUT ETHILON 2 0 FS 18 (SUTURE) ×3 IMPLANT
SUT ETHILON 2 0 PSLX (SUTURE) ×1 IMPLANT
SUT ETHILON 3 0 PS 1 (SUTURE) ×2 IMPLANT
SUT VIC AB 2-0 CT1 36 (SUTURE) ×1 IMPLANT
SUT VIC AB 2-0 FS1 27 (SUTURE) ×2 IMPLANT
SWAB COLLECTION DEVICE MRSA (MISCELLANEOUS) ×2 IMPLANT
SYR CONTROL 10ML LL (SYRINGE) IMPLANT
TOWEL OR 17X24 6PK STRL BLUE (TOWEL DISPOSABLE) ×3 IMPLANT
TOWEL OR 17X26 10 PK STRL BLUE (TOWEL DISPOSABLE) ×3 IMPLANT
TUBE ANAEROBIC SPECIMEN COL (MISCELLANEOUS) ×2 IMPLANT
TUBE CONNECTING 12'X1/4 (SUCTIONS) ×1
TUBE CONNECTING 12X1/4 (SUCTIONS) ×2 IMPLANT
TUBE FEEDING 5FR 15 INCH (TUBING) IMPLANT
TUBING CYSTO DISP (UROLOGICAL SUPPLIES) ×3 IMPLANT
UNDERPAD 30X30 INCONTINENT (UNDERPADS AND DIAPERS) ×4 IMPLANT
WATER STERILE IRR 1000ML POUR (IV SOLUTION) ×1 IMPLANT
YANKAUER SUCT BULB TIP NO VENT (SUCTIONS) ×3 IMPLANT

## 2015-12-24 NOTE — ED Notes (Signed)
The pt reports that he is still having pain and the last med has not helped thatmuch

## 2015-12-24 NOTE — Anesthesia Procedure Notes (Signed)
Procedure Name: Intubation Date/Time: 12/24/2015 6:55 PM Performed by: Molli HazardGORDON, Micahel Omlor M Pre-anesthesia Checklist: Patient identified, Emergency Drugs available, Suction available and Patient being monitored Patient Re-evaluated:Patient Re-evaluated prior to inductionOxygen Delivery Method: Circle system utilized Preoxygenation: Pre-oxygenation with 100% oxygen Intubation Type: IV induction, Rapid sequence and Cricoid Pressure applied Laryngoscope Size: Miller and 2 Grade View: Grade II Tube type: Oral Tube size: 7.5 mm Number of attempts: 1 Airway Equipment and Method: Stylet Placement Confirmation: ETT inserted through vocal cords under direct vision,  positive ETCO2 and breath sounds checked- equal and bilateral Secured at: 23 cm Tube secured with: Tape Dental Injury: Teeth and Oropharynx as per pre-operative assessment

## 2015-12-24 NOTE — ED Notes (Signed)
[facility] clerknvironmental Service Supervisor called @ [phone].

## 2015-12-24 NOTE — Transfer of Care (Signed)
Immediate Anesthesia Transfer of Care Note  Patient: Raymond Winters  Procedure(s) Performed: Procedure(s): IRRIGATION AND DEBRIDEMENT SHOULDER ABSCESS (Left)  Patient Location: PACU  Anesthesia Type:General  Level of Consciousness: awake, alert  and oriented  Airway & Oxygen Therapy: Patient connected to nasal cannula oxygen  Post-op Assessment: Report given to RN, Post -op Vital signs reviewed and stable and Patient moving all extremities X 4  Post vital signs: Reviewed and stable  Last Vitals:  Filed Vitals:   12/24/15 1400 12/24/15 1530  BP: 97/71 122/80  Pulse: 90   Temp:    Resp:      Last Pain:  Filed Vitals:   12/24/15 1726  PainSc: 10-Worst pain ever         Complications: No apparent anesthesia complications

## 2015-12-24 NOTE — ED Notes (Signed)
Report called to anesthesia and or nurse

## 2015-12-24 NOTE — Anesthesia Preprocedure Evaluation (Addendum)
Anesthesia Evaluation  Patient identified by MRN, date of birth, ID band Patient awake    Reviewed: Allergy & Precautions, NPO status , Patient's Chart, lab work & pertinent test results  Airway Mallampati: II  TM Distance: >3 FB Neck ROM: Full    Dental  (+) Poor Dentition   Pulmonary Current Smoker,    Pulmonary exam normal breath sounds clear to auscultation       Cardiovascular hypertension, Normal cardiovascular exam Rhythm:Regular Rate:Normal     Neuro/Psych negative neurological ROS  negative psych ROS   GI/Hepatic negative GI ROS, (+)     substance abuse  alcohol use, cocaine use and IV drug use,   Endo/Other  diabetes, Insulin Dependent  Renal/GU negative Renal ROS  negative genitourinary   Musculoskeletal negative musculoskeletal ROS (+)   Abdominal   Peds negative pediatric ROS (+)  Hematology negative hematology ROS (+)   Anesthesia Other Findings   Reproductive/Obstetrics negative OB ROS                           Anesthesia Physical Anesthesia Plan  ASA: III and emergent  Anesthesia Plan: General   Post-op Pain Management:    Induction: Intravenous  Airway Management Planned: Oral ETT  Additional Equipment:   Intra-op Plan:   Post-operative Plan: Extubation in OR  Informed Consent: I have reviewed the patients History and Physical, chart, labs and discussed the procedure including the risks, benefits and alternatives for the proposed anesthesia with the patient or authorized representative who has indicated his/her understanding and acceptance.   Dental advisory given  Plan Discussed with: CRNA and Surgeon  Anesthesia Plan Comments:         Anesthesia Quick Evaluation

## 2015-12-24 NOTE — ED Provider Notes (Signed)
CSN: [id number]     Arrival date & time 12/24/15  1205 History   First MD Initiated Contact with Patient 12/24/15 1220     Chief Complaint  Patient presents with  . Arm Pain     (Consider location/radiation/quality/duration/timing/severity/associated sxs/prior Treatment) HPI Raymond Winters is a 55 y.o. male with hx of heroin abuse, DM, hypertension, presents to ED with complaint of left shoulder pain. Pt states about a week ago He injected heroin into the left deltoid muscle. He states he normally injects into the vein but was unable to get a vein. He reports about 4-5 days ago he   noticed pain and swelling to the left shoulder area. He states painful to move his shoulder. He noticed it was red and very swollen. He denies any fever or chills. He denies any injuries. He denies any treatment at home prior to coming in. Any movement or palpation to the shoulder makes pain worse, nothing makes it better. No medications tried prior to coming in.   Past Medical History  Diagnosis Date  . Heroin abuse   . Diabetes mellitus without complication ([place])   . Hyperlipidemia   . Hypertension    Past Surgical History  Procedure Laterality Date  . I&d extremity Right 03/20/2013    Procedure: IRRIGATION AND DEBRIDEMENT EXTREMITY;  Surgeon: Schuyler Amor, MD;  Location: [facility];  Service: Orthopedics;  Laterality: Right;   Family History  Problem Relation Age of Onset  . Diabetes Mother   . Heart disease Mother   . Hypertension Mother   . Stroke Mother   . Vision loss Mother   . Heart disease Father   . Hypertension Father    Social History  Substance Use Topics  . Smoking status: Current Every Day Smoker -- 1.10 packs/day for 11 years  . Smokeless tobacco: Never Used     Comment: Smoking .5-1 ppd  . Alcohol Use: 0.0 oz/week    0 Standard drinks or equivalent per week    Review of Systems  Constitutional: Negative for fever and chills.  Respiratory: Negative for cough, chest tightness and  shortness of breath.   Cardiovascular: Negative for chest pain, palpitations and leg swelling.  Gastrointestinal: Negative for nausea, vomiting, abdominal pain, diarrhea and abdominal distention.  Genitourinary: Negative for urgency.  Musculoskeletal: Positive for myalgias, joint swelling and arthralgias. Negative for neck pain and neck stiffness.  Skin: Negative for rash.  Allergic/Immunologic: Negative for immunocompromised state.  Neurological: Negative for dizziness, weakness, light-headedness, numbness and headaches.      Allergies  Review of patient's allergies indicates no known allergies.  Home Medications   Prior to Admission medications   Medication Sig Start Date End Date Taking? Authorizing Provider  aspirin EC 81 MG tablet Take 1 tablet (81 mg total) by mouth daily. 04/04/14   Lance Bosch, NP  cephALEXin (KEFLEX) 500 MG capsule Take 2 capsules (1,000 mg total) by mouth 2 (two) times daily. Patient not taking: Reported on 09/26/2015 09/15/15   Wandra Arthurs, MD  Cholecalciferol (VITAMIN D-3) 1000 UNITS CAPS Take 1,000 Units by mouth daily.     Historical Provider, MD  clotrimazole-betamethasone (LOTRISONE) cream Apply 1 application topically 2 (two) times daily as needed (anal fissures). Reported on 09/26/2015 07/29/15   Historical Provider, MD  gabapentin (NEURONTIN) 300 MG capsule Take 1 capsule (300 mg total) by mouth at bedtime. For diabetic nerve pain 06/11/15   Lance Bosch, NP  glipiZIDE (GLUCOTROL) 10 MG tablet Take 10 mg  by mouth daily before breakfast.    Historical Provider, MD  glucose blood (TRUE METRIX BLOOD GLUCOSE TEST) test strip Check sugars 3 times per day 05/10/15   Ambrose Finland, NP  glucose monitoring kit (FREESTYLE) monitoring kit 1 each by Does not apply route as needed for other. 12/27/13   Ambrose Finland, NP  HYDROcodone-acetaminophen (NORCO/VICODIN) 5-325 MG tablet Take 1-2 tablets by mouth every 6 (six) hours as needed. Patient not taking: Reported on  09/26/2015 09/15/15   Richardean Canal, MD  ibuprofen (ADVIL,MOTRIN) 800 MG tablet Take 1 tablet (800 mg total) by mouth 3 (three) times daily. 10/07/15   Ambrose Finland, NP  Insulin Glargine (LANTUS SOLOSTAR) 100 UNIT/ML Solostar Pen Inject 25 Units into the skin at bedtime. 09/26/15   Ambrose Finland, NP  meloxicam (MOBIC) 15 MG tablet Take 1 tablet (15 mg total) by mouth daily. 07/24/15   Briscoe Deutscher, DO  metFORMIN (GLUCOPHAGE) 1000 MG tablet Take 1 tablet (1,000 mg total) by mouth 2 (two) times daily with a meal. 05/10/15   Ambrose Finland, NP  mirtazapine (REMERON) 15 MG tablet TAKE 1 TABLET BY MOUTH AT BEDTIME. 09/27/15   Ambrose Finland, NP  Multiple Vitamin (MULTIVITAMIN WITH MINERALS) TABS tablet Take 1 tablet by mouth daily.    Historical Provider, MD  naproxen (NAPROSYN) 500 MG tablet Take 1 tablet (500 mg total) by mouth 2 (two) times daily with a meal. Patient not taking: Reported on 09/15/2015 07/07/15   Alvira Monday, MD  pravastatin (PRAVACHOL) 20 MG tablet Take 1 tablet (20 mg total) by mouth daily. Patient taking differently: Take 20 mg by mouth at bedtime.  05/10/15   Ambrose Finland, NP  sildenafil (VIAGRA) 100 MG tablet Take 100 mg by mouth daily as needed for erectile dysfunction.    Historical Provider, MD  traMADol (ULTRAM) 50 MG tablet Take 1 tablet (50 mg total) by mouth every 12 (twelve) hours as needed. Patient not taking: Reported on 09/15/2015 07/15/15   Ambrose Finland, NP  TRUEPLUS LANCETS 28G MISC Check sugars 3 times per day 06/12/15   Quentin Angst, MD   BP 98/76 mmHg  Pulse 97  Temp(Src) 98.8 F (37.1 C) (Oral)  Resp 16  SpO2 100% Physical Exam  Constitutional: He appears well-developed and well-nourished. No distress.  HENT:  Head: Normocephalic and atraumatic.  Eyes: Conjunctivae are normal.  Neck: Neck supple.  Cardiovascular: Normal rate, regular rhythm and normal heart sounds.   Pulmonary/Chest: Effort normal. No respiratory distress. He has no wheezes. He has  no rales.  Musculoskeletal: He exhibits no edema.       Arms: Swelling, erythema, tenderness to the left deltoid. The swelling and erythema extends over anterior shoulder joint, over acromion, over posterior joint as well. Limited range of motion of the left shoulder due to severe pain. Shoulder feels warm to the touch.  Neurological: He is alert.  Skin: Skin is warm and dry.  Nursing note and vitals reviewed.   ED Course  Procedures (including critical care time) Labs Review Labs Reviewed  CBC WITH DIFFERENTIAL/PLATELET - Abnormal; Notable for the following:    WBC 13.0 (*)    Hemoglobin 11.6 (*)    HCT 34.2 (*)    Neutro Abs 10.7 (*)    All other components within normal limits  I-STAT CG4 LACTIC ACID, ED - Abnormal; Notable for the following:    Lactic Acid, Venous 2.51 (*)    All other components  within normal limits  I-STAT CHEM 8, ED - Abnormal; Notable for the following:    Sodium 127 (*)    Chloride 90 (*)    Glucose, Bld 306 (*)    Calcium, Ion 1.04 (*)    Hemoglobin 12.6 (*)    HCT 37.0 (*)    All other components within normal limits  C-REACTIVE PROTEIN  SEDIMENTATION RATE  I-STAT CG4 LACTIC ACID, ED    Imaging Review Ct Shoulder Left W Contrast  12/24/2015  CLINICAL DATA:  Left shoulder pain and swelling for approximately 7 days, after injecting heroin in shoulder. EXAM: CT OF THE LEFT SHOULDER WITH CONTRAST TECHNIQUE: Multidetector CT imaging was performed following the standard protocol during bolus administration of intravenous contrast. CONTRAST:  1 ISOVUE-300 IOPAMIDOL (ISOVUE-300) INJECTION 61% COMPARISON:  None. FINDINGS: Complex collection of fluid and air posterior to the left shoulder and proximal left humerus, almost certainly representing abscess, measuring at least 17 cm craniocaudal dimension (incompletely imaged at the lower aspects of the exam), 6 cm AP dimension and approximately 7 cm greatest transverse dimension. No evidence of extension into the  joint space seen. The underlying osseous structures appear intact and normal in mineralization throughout. No destructive changes to suggest underlying osteomyelitis. No fracture. IMPRESSION: 1. Soft tissue abscess collection posterior to the left shoulder and proximal left humerus, incompletely imaged at the lower aspects of the exam, measuring at least 17 cm craniocaudal dimension and approximately 7 cm in both the AP and transverse dimensions. 2. No evidence of abscess extension into the glenohumeral joint space. 3. No osseous abnormality seen.  No evidence of osteomyelitis. Electronically Signed   By: Franki Cabot M.D.   On: 12/24/2015 15:11   I have personally reviewed and evaluated these images and lab results as part of my medical decision-making.   EKG Interpretation None      MDM   Final diagnoses:  Septic joint of left shoulder region [facility])  Abscess of left shoulder  Hyperglycemia  Leukocytosis   Patient with clear infectious process to the left shoulder area, it is unclear however if infection extends into the joint versus just the muscle or skin. It is also unclear if there is a deep abscess. Patient is afebrile, normal vital signs. He is nontoxic-appearing. Will check labs, will get CT of the shoulder with contrast for further evaluation.  3:37 PM CT scan showing large soft tissue abscess, with no obvious intra-articular extension. Discussed results with patient and the fact that he will be going to surgery, patient was reminded that he is nothing by mouth, and given that he keeps asking for food and drinks. Patient is receiving IV fluids. Will recheck CBG. I spoke with Dr.XU with orthopedic surgery, he will come by and see patient and will take him to the OR. He advised no antibiotics, so he can culture it. I also spoke with family practice, they will come by and see and admit the patient, and medically clear for surgery.  Filed Vitals:   12/24/15 1220 12/24/15 1254 12/24/15  1315 12/24/15 1400  BP: 98/76  104/73 97/71  Pulse: 97 95 92 90  Temp: 98.8 F (37.1 C)     TempSrc: Oral     Resp: 16     SpO2: 100% 98% 99% 99%     Jeannett Senior, PA-C 12/24/15 [place], MD 12/24/15 [phone]

## 2015-12-24 NOTE — ED Notes (Signed)
Pt here for swollen left shoulder after injecting heroin 9 days ago; pt sts pain and swelling in shoulder and upper arm area

## 2015-12-24 NOTE — ED Notes (Signed)
Permit  Signed to or

## 2015-12-24 NOTE — ED Notes (Signed)
2nd liter hung.

## 2015-12-24 NOTE — ED Notes (Signed)
Pt up walking aroound in the room.  He was attempting to void in the sink  Before he asked for a urinal

## 2015-12-24 NOTE — ED Notes (Signed)
PA at bedside attempted IV

## 2015-12-24 NOTE — Progress Notes (Signed)
Received patient from PACU.  Patient AOx4, VS stable, O2Sat at 92% on 2L/min via [place] and pain at 4/10.  Patient has Wound VAC running at 125 mmHg continuous with no leaks noted.  Patient requested something to eat and will get sandwich from [employer].  Patient oriented to room, bed control and call light.  Patient on isolation for bed bugs.

## 2015-12-24 NOTE — ED Notes (Signed)
Called CT advised patient has IV>  

## 2015-12-24 NOTE — Progress Notes (Signed)
Pharmacy Antibiotic Note  Raymond ButteryGary Winters is a 55 y.o. male admitted on 12/24/2015 with left shoulder abscess due to IV drug use now s/p debridement and wound VAC placement.  Pharmacy has been consulted for Vancomycin and Zosyn dosing. Received doses earlier this evening at 1900 pm  Plan: Zosyn 3.375 grams iv Q 8 hours - 4 hr infusion Vancomycin 500 mg iv Q 12 hours     Temp (24hrs), Avg:98.1 F (36.7 C), Min:97.3 F (36.3 C), Max:98.8 F (37.1 C)   Recent Labs Lab 12/24/15 1303 12/24/15 1309 12/24/15 1632  WBC 13.0*  --   --   CREATININE  --  1.10  --   LATICACIDVEN  --  2.51* 2.67*    CrCl cannot be calculated (Unknown ideal weight.).    No Known Allergies  Thank you for allowing pharmacy to be a part of this patient's care.  Elwin Sleightowell, Shar Paez Kay 12/24/2015 9:32 PM

## 2015-12-24 NOTE — ED Notes (Signed)
PA aware RN unable to get IV stated to place for IV team. Phlebotomy at bedside.

## 2015-12-24 NOTE — H&P (Signed)
[facility] Admission History and Physical Service Pager: [phone]  Patient name: Raymond Winters Medical record number: [id number] Date of birth: 1961-03-18 Age: 55 y.o. Gender: male  Primary Care Provider: Lance Bosch, NP Consultants: Orthopedics Code Status: Full  Chief Complaint: Left shoulder pain  Assessment and Plan: Raymond Winters is a 55 y.o. male presenting with left deltoid soft tissue abscess. PMH is significant for heroin abuse, T2DM, HLD, HTN.   Left Deltoid Abscess. 17x7cm abscess in left shoulder found on CT. Secondary to injection on heroin into left deltoid. No evidence of osteomyelitis on CT. Mild leukocytosis (WBC 13.0). Vitals stable with no other signs of systemic infection.  - Orthopedics consulted, appreciate assistance - Will hold off on antibiotics until operative cultures obtained as patient currently well appearing - Consider starting broad spectrum IV antibiotics once cultures obtained. - Check blood cultures - Does not have active ACS and has good function capacity (>4 METs) - is clear for surgery  Heroin Abuse. Daily user for several years. HIV negative in 2015. HCV positive (see below problem). Also with "occasional" alcohol use.  - CIWA - Check HIV - Social work consult.   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 No documentation of treatment for this. - Will need follow up with ID (has not seen them since 2014) - Check CMP/INR - Check UDS  Hyponatremia. Na 127 on admission. Possibly secondary to dehydration/malnutrition (appears dry on exam).  - NS @ 100cc/hr overnight - Trend BMP  T2DM. On lantus 25U nightly at home. A1c 12.7 in January. - Reduce home lantus to 15U - SSI - Check A1c  HLD. - Hold home pravastatin until CMP returns.   FEN/GI: NPO in anticipation of surgery, carb modified diet afterwards, NS '@100cc'$ /hr Prophylaxis: Lovenox  Disposition: Admitted to med surg pending above management.    History of Present Illness:  Raymond Winters is a 55 y.o. male presenting with left shoulder pain.  Patient presents to the ED with left shoulder pain for the past week after injecting heroin into his left deltoid muscle about a week ago. Patient first noticed some pain and swelling in the area that gradually worsened over the past few days. Patient was unable to tolerate the pain any longer and presented to the ED today. Shoulder has also become significantly red over the past few days. Patient is barely able to move his left shoulder currently. No fevers or chills. No medications tried.  Patient reports daily heroin use for the past several years.   No chest pain or shortness of breath. Is able to walk a flight of stairs without chest pain or shortness of breath.   Review Of Systems: Per HPI, otherwise the remainder of the systems were negative.  Patient Active Problem List   Diagnosis Date Noted  . Glenohumeral arthritis 07/24/2015  . Left shoulder pain 07/24/2015  . 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 Tobacco abuse 07/26/2014  . Protein-calorie malnutrition, severe ([place]) 12/05/2013  . Hyperkalemia 12/04/2013  . Heroin withdrawal ([place]) 12/04/2013  . Alcohol abuse 12/04/2013  . Hyperglycemia 12/04/2013  . Metabolic acidosis with respiratory alkalosis 12/04/2013  . Heroin abuse 04/04/2013  . DM2 (diabetes mellitus, type 2) ([place]) 03/20/2013    Past Medical History: Past Medical History  Diagnosis Date  . Heroin abuse   . Diabetes mellitus without complication ([place])   . Hyperlipidemia   . Hypertension     Past Surgical History: Past Surgical History  Procedure Laterality  Date  . I&d extremity Right 03/20/2013    Procedure: IRRIGATION AND DEBRIDEMENT EXTREMITY;  Surgeon: Schuyler Amor, MD;  Location: [place];  Service: Orthopedics;  Laterality: Right;    Social History: Social History  Substance Use Topics  . Smoking status: Current Every Day Smoker -- 1.10 packs/day for 11  years  . Smokeless tobacco: Never Used     Comment: Smoking .5-1 ppd  . Alcohol Use: 0.0 oz/week    0 Standard drinks or equivalent per week   Additional social history: Daily heroin use.   Please also refer to relevant sections of EMR.  Family History: Family History  Problem Relation Age of Onset  . Diabetes Mother   . Heart disease Mother   . Hypertension Mother   . Stroke Mother   . Vision loss Mother   . Heart disease Father   . Hypertension Father    Allergies and Medications: No Known Allergies No current facility-administered medications on file prior to encounter.   Current Outpatient Prescriptions on File Prior to Encounter  Medication Sig Dispense Refill  . aspirin EC 81 MG tablet Take 1 tablet (81 mg total) by mouth daily. 30 tablet 5  . Cholecalciferol (VITAMIN D-3) 1000 UNITS CAPS Take 1,000 Units by mouth daily.     Marland Kitchen gabapentin (NEURONTIN) 300 MG capsule Take 1 capsule (300 mg total) by mouth at bedtime. For diabetic nerve pain 30 capsule 3  . glipiZIDE (GLUCOTROL) 10 MG tablet Take 10 mg by mouth daily before breakfast.    . glucose blood (TRUE METRIX BLOOD GLUCOSE TEST) test strip Check sugars 3 times per day 100 each 12  . glucose monitoring kit (FREESTYLE) monitoring kit 1 each by Does not apply route as needed for other. 1 each 0  . ibuprofen (ADVIL,MOTRIN) 800 MG tablet Take 1 tablet (800 mg total) by mouth 3 (three) times daily. (Patient taking differently: Take 800 mg by mouth every 8 (eight) hours as needed. ) 30 tablet 0  . Insulin Glargine (LANTUS SOLOSTAR) 100 UNIT/ML Solostar Pen Inject 25 Units into the skin at bedtime. 5 pen 10  . meloxicam (MOBIC) 15 MG tablet Take 1 tablet (15 mg total) by mouth daily. 30 tablet 3  . metFORMIN (GLUCOPHAGE) 1000 MG tablet Take 1 tablet (1,000 mg total) by mouth 2 (two) times daily with a meal. 60 tablet 4  . mirtazapine (REMERON) 15 MG tablet TAKE 1 TABLET BY MOUTH AT BEDTIME. 30 tablet 2  . Multiple Vitamin  (MULTIVITAMIN WITH MINERALS) TABS tablet Take 1 tablet by mouth daily.    . pravastatin (PRAVACHOL) 20 MG tablet Take 1 tablet (20 mg total) by mouth daily. (Patient taking differently: Take 20 mg by mouth at bedtime. ) 30 tablet 4  . TRUEPLUS LANCETS 28G MISC Check sugars 3 times per day 100 each 12  . cephALEXin (KEFLEX) 500 MG capsule Take 2 capsules (1,000 mg total) by mouth 2 (two) times daily. (Patient not taking: Reported on 09/26/2015) 30 capsule 0  . HYDROcodone-acetaminophen (NORCO/VICODIN) 5-325 MG tablet Take 1-2 tablets by mouth every 6 (six) hours as needed. (Patient not taking: Reported on 09/26/2015) 10 tablet 0  . naproxen (NAPROSYN) 500 MG tablet Take 1 tablet (500 mg total) by mouth 2 (two) times daily with a meal. (Patient not taking: Reported on 09/15/2015) 30 tablet 0  . traMADol (ULTRAM) 50 MG tablet Take 1 tablet (50 mg total) by mouth every 12 (twelve) hours as needed. (Patient not taking: Reported on 09/15/2015)  30 tablet 0    Objective: BP 97/71 mmHg  Pulse 90  Temp(Src) 98.8 F (37.1 C) (Oral)  Resp 16  SpO2 99% Exam: General: Thin, chronically ill appearing male in NAD lying in hospital bed Eyes: Mild scleral icterus noted, PERRL ENTM: Tacky appearing mucus membranes Neck: FROM Cardiovascular: Distant heart sounds. No murmurs appreciated Respiratory: NWOB, CTAB Abdomen: Thin. +BS, S, NT, ND MSK: Significant swelling, erythema, and pain to left deltoid muscle. Warm to touch. Patient not able to abduct left arm. Skin: Warm, dry Neuro: Alert and conversational. No focal deficits Psych: Appropriate mood and affect.  Labs and Imaging: CBC BMET   Recent Labs Lab 12/24/15 1303 12/24/15 1309  WBC 13.0*  --   HGB 11.6* 12.6*  HCT 34.2* 37.0*  PLT 229  --     Recent Labs Lab 12/24/15 1309  NA 127*  K 4.0  CL 90*  BUN 17  CREATININE 1.10  GLUCOSE 306*     Ct Shoulder Left W Contrast  12/24/2015  CLINICAL DATA:  Left shoulder pain and swelling for  approximately 7 days, after injecting heroin in shoulder. EXAM: CT OF THE LEFT SHOULDER WITH CONTRAST TECHNIQUE: Multidetector CT imaging was performed following the standard protocol during bolus administration of intravenous contrast. CONTRAST:  1 ISOVUE-300 IOPAMIDOL (ISOVUE-300) INJECTION 61% COMPARISON:  None. FINDINGS: Complex collection of fluid and air posterior to the left shoulder and proximal left humerus, almost certainly representing abscess, measuring at least 17 cm craniocaudal dimension (incompletely imaged at the lower aspects of the exam), 6 cm AP dimension and approximately 7 cm greatest transverse dimension. No evidence of extension into the joint space seen. The underlying osseous structures appear intact and normal in mineralization throughout. No destructive changes to suggest underlying osteomyelitis. No fracture. IMPRESSION: 1. Soft tissue abscess collection posterior to the left shoulder and proximal left humerus, incompletely imaged at the lower aspects of the exam, measuring at least 17 cm craniocaudal dimension and approximately 7 cm in both the AP and transverse dimensions. 2. No evidence of abscess extension into the glenohumeral joint space. 3. No osseous abnormality seen.  No evidence of osteomyelitis. Electronically Signed   By: Franki Cabot M.D.   On: 12/24/2015 15:11   Dg Chest Portable 1 View  12/24/2015  CLINICAL DATA:  Preoperative evaluation.  Hypertension. EXAM: PORTABLE CHEST 1 VIEW COMPARISON:  Chest radiograph September 15, 2015; chest CT September 15, 2015 FINDINGS: Mild patchy opacity in the left base is consistent with either small focus of pneumonia currently or residual scarring from pneumonia noted on prior examinations in the left base. Lungs elsewhere clear. Heart size and pulmonary vascularity are normal. No adenopathy. There is evidence of lucency in a portion of the right femoral head, concerning for potential avascular necrosis. Mottled appearance is noted in the  left proximal humeral epiphysis and metaphysis. IMPRESSION: Question scarring left base versus focal pneumonia in this area. Both entities may exist concurrently. Lungs elsewhere clear. Cardiac silhouette normal. 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 Mottled appearance of the left proximal humerus is of uncertain etiology. Osteomyelitis in the proximal left humerus must be of concern. This finding may warrant MR or nuclear medicine three-phase bone scan to further assess. Electronically Signed   By: Lowella Grip III M.D.   On: 12/24/2015 16:06   Vivi Barrack, MD 12/24/2015, 3:35 PM PGY-3, [facility] pager: [phone], text pages welcome

## 2015-12-24 NOTE — Consult Note (Signed)
ORTHOPAEDIC CONSULTATION  REQUESTING PHYSICIAN: Sharlett Iles, MD  Chief Complaint: Left shoulder abscess  HPI: Raymond Winters is a 55 y.o. male who presents with left shoulder abscess from IVDA heroin for the last week.  He injected heroin and started having worsening pain, redness.  He presented today due to severe pain.  Does not radiate, throbbing pain.  He is not able to move his shoulder due to pain.  He is s/p right forearm abscess from heroin use 3 years ago that was I&D'ed by Dr. Burney Gauze.  Denies CP, SOB.  Patient is signs of sepsis.   Past Medical History  Diagnosis Date  . Heroin abuse   . Diabetes mellitus without complication ([place])   . Hyperlipidemia   . Hypertension    Past Surgical History  Procedure Laterality Date  . I&d extremity Right 03/20/2013    Procedure: IRRIGATION AND DEBRIDEMENT EXTREMITY;  Surgeon: Schuyler Amor, MD;  Location: [place];  Service: Orthopedics;  Laterality: Right;   Social History   Social History  . Marital Status: Single    Spouse Name: N/A  . Number of Children: N/A  . Years of Education: N/A   Social History Main Topics  . Smoking status: Current Every Day Smoker -- 1.10 packs/day for 11 years  . Smokeless tobacco: Never Used     Comment: Smoking .5-1 ppd  . Alcohol Use: 0.0 oz/week    0 Standard drinks or equivalent per week  . Drug Use: Yes    Special: IV     Comment: heroin  . Sexual Activity: Not Asked   Other Topics Concern  . None   Social History Narrative   Family History  Problem Relation Age of Onset  . Diabetes Mother   . Heart disease Mother   . Hypertension Mother   . Stroke Mother   . Vision loss Mother   . Heart disease Father   . Hypertension Father    - negative except otherwise stated in the family history section No Known Allergies Prior to Admission medications   Medication Sig Start Date End Date Taking? Authorizing Provider  aspirin EC 81 MG tablet Take 1 tablet (81 mg total) by  mouth daily. 04/04/14  Yes Lance Bosch, NP  Cholecalciferol (VITAMIN D-3) 1000 UNITS CAPS Take 1,000 Units by mouth daily.    Yes Historical Provider, MD  gabapentin (NEURONTIN) 300 MG capsule Take 1 capsule (300 mg total) by mouth at bedtime. For diabetic nerve pain 06/11/15  Yes Lance Bosch, NP  glipiZIDE (GLUCOTROL) 10 MG tablet Take 10 mg by mouth daily before breakfast.   Yes Historical Provider, MD  glucose blood (TRUE METRIX BLOOD GLUCOSE TEST) test strip Check sugars 3 times per day 05/10/15  Yes Lance Bosch, NP  glucose monitoring kit (FREESTYLE) monitoring kit 1 each by Does not apply route as needed for other. 12/27/13  Yes Lance Bosch, NP  ibuprofen (ADVIL,MOTRIN) 800 MG tablet Take 1 tablet (800 mg total) by mouth 3 (three) times daily. Patient taking differently: Take 800 mg by mouth every 8 (eight) hours as needed.  10/07/15  Yes Lance Bosch, NP  Insulin Glargine (LANTUS SOLOSTAR) 100 UNIT/ML Solostar Pen Inject 25 Units into the skin at bedtime. 09/26/15  Yes Lance Bosch, NP  meloxicam (MOBIC) 15 MG tablet Take 1 tablet (15 mg total) by mouth daily. 07/24/15  Yes Bryan R Hess, DO  metFORMIN (GLUCOPHAGE) 1000 MG tablet Take 1 tablet (1,000 mg  total) by mouth 2 (two) times daily with a meal. 05/10/15  Yes Lance Bosch, NP  mirtazapine (REMERON) 15 MG tablet TAKE 1 TABLET BY MOUTH AT BEDTIME. 09/27/15  Yes Lance Bosch, NP  Multiple Vitamin (MULTIVITAMIN WITH MINERALS) TABS tablet Take 1 tablet by mouth daily.   Yes Historical Provider, MD  pravastatin (PRAVACHOL) 20 MG tablet Take 1 tablet (20 mg total) by mouth daily. Patient taking differently: Take 20 mg by mouth at bedtime.  05/10/15  Yes Lance Bosch, NP  TRUEPLUS LANCETS 28G MISC Check sugars 3 times per day 06/12/15  Yes Olugbemiga E Doreene Burke, MD  cephALEXin (KEFLEX) 500 MG capsule Take 2 capsules (1,000 mg total) by mouth 2 (two) times daily. Patient not taking: Reported on 09/26/2015 09/15/15   Wandra Arthurs, MD    HYDROcodone-acetaminophen (NORCO/VICODIN) 5-325 MG tablet Take 1-2 tablets by mouth every 6 (six) hours as needed. Patient not taking: Reported on 09/26/2015 09/15/15   Wandra Arthurs, MD  naproxen (NAPROSYN) 500 MG tablet Take 1 tablet (500 mg total) by mouth 2 (two) times daily with a meal. Patient not taking: Reported on 09/15/2015 07/07/15   Gareth Morgan, MD  traMADol (ULTRAM) 50 MG tablet Take 1 tablet (50 mg total) by mouth every 12 (twelve) hours as needed. Patient not taking: Reported on 09/15/2015 07/15/15   Lance Bosch, NP   Ct Shoulder Left W Contrast  12/24/2015  CLINICAL DATA:  Left shoulder pain and swelling for approximately 7 days, after injecting heroin in shoulder. EXAM: CT OF THE LEFT SHOULDER WITH CONTRAST TECHNIQUE: Multidetector CT imaging was performed following the standard protocol during bolus administration of intravenous contrast. CONTRAST:  1 ISOVUE-300 IOPAMIDOL (ISOVUE-300) INJECTION 61% COMPARISON:  None. FINDINGS: Complex collection of fluid and air posterior to the left shoulder and proximal left humerus, almost certainly representing abscess, measuring at least 17 cm craniocaudal dimension (incompletely imaged at the lower aspects of the exam), 6 cm AP dimension and approximately 7 cm greatest transverse dimension. No evidence of extension into the joint space seen. The underlying osseous structures appear intact and normal in mineralization throughout. No destructive changes to suggest underlying osteomyelitis. No fracture. IMPRESSION: 1. Soft tissue abscess collection posterior to the left shoulder and proximal left humerus, incompletely imaged at the lower aspects of the exam, measuring at least 17 cm craniocaudal dimension and approximately 7 cm in both the AP and transverse dimensions. 2. No evidence of abscess extension into the glenohumeral joint space. 3. No osseous abnormality seen.  No evidence of osteomyelitis. Electronically Signed   By: Franki Cabot M.D.   On:  12/24/2015 15:11   Dg Chest Portable 1 View  12/24/2015  CLINICAL DATA:  Preoperative evaluation.  Hypertension. EXAM: PORTABLE CHEST 1 VIEW COMPARISON:  Chest radiograph September 15, 2015; chest CT September 15, 2015 FINDINGS: Mild patchy opacity in the left base is consistent with either small focus of pneumonia currently or residual scarring from pneumonia noted on prior examinations in the left base. Lungs elsewhere clear. Heart size and pulmonary vascularity are normal. No adenopathy. There is evidence of lucency in a portion of the right femoral head, concerning for potential avascular necrosis. Mottled appearance is noted in the left proximal humeral epiphysis and metaphysis. IMPRESSION: Question scarring left base versus focal pneumonia in this area. Both entities may exist concurrently. Lungs elsewhere clear. Cardiac silhouette normal. 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 Mottled appearance of the left proximal humerus is  of uncertain etiology. Osteomyelitis in the proximal left humerus must be of concern. This finding may warrant MR or nuclear medicine three-phase bone scan to further assess. Electronically Signed   By: Lowella Grip III M.D.   On: 12/24/2015 16:06   - pertinent xrays, CT, MRI studies were reviewed and independently interpreted  Positive ROS: All other systems have been reviewed and were otherwise negative with the exception of those mentioned in the HPI and as above.  Physical Exam: General: Alert, no acute distress Cardiovascular: No pedal edema Respiratory: No cyanosis, no use of accessory musculature GI: No organomegaly, abdomen is soft and non-tender Skin: No lesions in the area of chief complaint Neurologic: Sensation intact distally Psychiatric: Patient is competent for consent with normal mood and affect Lymphatic: No axillary or cervical lymphadenopathy  MUSCULOSKELETAL:  - large fluctuant mass in the posterior shoulder region with  cellulitis - tender to palpation - skin intact - ROM limited due to pain - NVI distally  Assessment: Left posterior shoulder abscess DM IVDA, heroin  Plan: - NPO - to OR for I&D - vanc and zosyn after cultures - appreciate hospitalist admission  Thank you for the consult and the opportunity to see Raymond Winters  N. Eduard Roux, MD [place] 6:36 PM

## 2015-12-24 NOTE — Op Note (Signed)
   Date of Surgery: 12/24/2015  INDICATIONS: Raymond Winters is a 55 y.o.-year-old male with a left shoulder abscess from IV drug use;  The patient did consent to the procedure after discussion of the risks and benefits.  PREOPERATIVE DIAGNOSIS: Left shoulder deep, complex intramuscular and submuscular abscess  POSTOPERATIVE DIAGNOSIS: Same.  PROCEDURE: 1. Debridement of muscle, subcutaneous tissue, skin 19 x 10 x 2 cm 2. Wound vac placement <50 sq cm  SURGEON: N. Glee ArvinMichael Xu, M.D.  ASSIST: none.  ANESTHESIA:  general  IV FLUIDS AND URINE: See anesthesia.  ESTIMATED BLOOD LOSS: 100 mL.  IMPLANTS: none  DRAINS: wound vac  COMPLICATIONS: None.  DESCRIPTION OF PROCEDURE: The patient was brought to the operating room and placed supine on the operating table.  The patient had been signed prior to the procedure and this was documented. The patient had the anesthesia placed by the anesthesiologist.  A time-out was performed to confirm that this was the correct patient, site, side and location.  Patient was placed in the lateral position in the peg board with all bony prominences well padded.  The patient had the operative extremity prepped and draped in the standard surgical fashion.    A longitudinal incision directly over the posterolateral shoulder was created.  There was a large amount of necrotic deltoid muscle.  This was extremely foul smelling.  Intraoperative cultures were taken and then vanc and zosyn were given.  I then performed sharp excisional debridement of the deltoid muscle and the subcutaneous tissue with rongeur and knife.  The abscess was within the muscle and deep to it.  There was undermining also.  After thorough debridement, I irrigated with 6 L of normal saline.  The wound was then dressed with a wound vac at -125 mm Hg.  Patient tolerated procedure well.  POSTOPERATIVE PLAN: Will need to be on antibiotics until speciation.  Will need to return to OR in the next 48-72 hours for  repeat I&D and possible closure of wound.  Mayra ReelN. Michael Xu, MD [facility] [phone] 8:12 PM

## 2015-12-24 NOTE — ED Notes (Signed)
Patient arm noted to be red, warm to touch, and swollen. Tender to touch, limited ROM.

## 2015-12-25 ENCOUNTER — Other Ambulatory Visit: Payer: Self-pay

## 2015-12-25 ENCOUNTER — Encounter (HOSPITAL_COMMUNITY): Payer: Self-pay | Admitting: Orthopaedic Surgery

## 2015-12-25 DIAGNOSIS — M009 Pyogenic arthritis, unspecified: Secondary | ICD-10-CM

## 2015-12-25 DIAGNOSIS — L0291 Cutaneous abscess, unspecified: Secondary | ICD-10-CM

## 2015-12-25 LAB — CBC
HCT: 36.5 % — ABNORMAL LOW (ref 39.0–52.0)
Hemoglobin: 12.3 g/dL — ABNORMAL LOW (ref 13.0–17.0)
MCH: 26.7 pg (ref 26.0–34.0)
MCHC: 33.7 g/dL (ref 30.0–36.0)
MCV: 79.3 fL (ref 78.0–100.0)
PLATELETS: 215 10*3/uL (ref 150–400)
RBC: 4.6 MIL/uL (ref 4.22–5.81)
RDW: 13 % (ref 11.5–15.5)
WBC: 16.3 10*3/uL — ABNORMAL HIGH (ref 4.0–10.5)

## 2015-12-25 LAB — COMPREHENSIVE METABOLIC PANEL
ALK PHOS: 82 U/L (ref 38–126)
ALT: 19 U/L (ref 17–63)
ANION GAP: 12 (ref 5–15)
AST: 34 U/L (ref 15–41)
Albumin: 2.1 g/dL — ABNORMAL LOW (ref 3.5–5.0)
BUN: 14 mg/dL (ref 6–20)
CALCIUM: 7.9 mg/dL — AB (ref 8.9–10.3)
CHLORIDE: 98 mmol/L — AB (ref 101–111)
CO2: 23 mmol/L (ref 22–32)
Creatinine, Ser: 1.09 mg/dL (ref 0.61–1.24)
GFR calc non Af Amer: >60 mL/min (ref >60)
Glucose, Bld: 99 mg/dL (ref 65–99)
POTASSIUM: 3.9 mmol/L (ref 3.5–5.1)
SODIUM: 133 mmol/L — AB (ref 135–145)
Total Bilirubin: 0.8 mg/dL (ref 0.3–1.2)
Total Protein: 6.1 g/dL — ABNORMAL LOW (ref 6.5–8.1)

## 2015-12-25 LAB — GLUCOSE, CAPILLARY
GLUCOSE-CAPILLARY: 127 mg/dL — AB (ref 65–99)
GLUCOSE-CAPILLARY: 180 mg/dL — AB (ref 65–99)
GLUCOSE-CAPILLARY: 259 mg/dL — AB (ref 65–99)
Glucose-Capillary: 313 mg/dL — ABNORMAL HIGH (ref 65–99)
Glucose-Capillary: 65 mg/dL (ref 65–99)
Glucose-Capillary: 225 mg/dL — ABNORMAL HIGH (ref 65–99)

## 2015-12-25 LAB — HIV ANTIBODY (ROUTINE TESTING W REFLEX): HIV SCREEN 4TH GENERATION: NONREACTIVE

## 2015-12-25 MED ORDER — HYDROCODONE-ACETAMINOPHEN 7.5-325 MG PO TABS
1.0000 | ORAL_TABLET | ORAL | Status: DC | PRN
  Administered 2015-12-25: 2 via ORAL
  Administered 2015-12-25: 1 via ORAL
  Administered 2015-12-25 – 2015-12-31 (×30): 2 via ORAL
  Filled 2015-12-25 (×28): qty 2
  Filled 2015-12-25: qty 1
  Filled 2015-12-25 (×3): qty 2

## 2015-12-25 MED ORDER — PRAVASTATIN SODIUM 10 MG PO TABS
20.0000 mg | ORAL_TABLET | Freq: Every day | ORAL | Status: DC
  Administered 2015-12-25 – 2015-12-31 (×5): 20 mg via ORAL
  Filled 2015-12-25 (×5): qty 2

## 2015-12-25 MED ORDER — ENSURE ENLIVE PO LIQD
237.0000 mL | Freq: Two times a day (BID) | ORAL | Status: DC
  Administered 2015-12-26 (×2): 237 mL via ORAL

## 2015-12-25 NOTE — Progress Notes (Signed)
Inpatient Diabetes Program Recommendations  AACE/ADA: New Consensus Statement on Inpatient Glycemic Control (2015)  Target Ranges:  Prepandial:   less than 140 mg/dL      Peak postprandial:   less than 180 mg/dL (1-2 hours)      Critically ill patients:  140 - 180 mg/dL   Lab Results  Component Value Date   GLUCAP 225* 12/25/2015   HGBA1C 12.70 07/15/2015    Review of Glycemic Control:  Results for Larinda ButteryMIX, Diesel (MRN [id number]) as of 12/25/2015 13:18  Ref. Range 12/24/2015 21:42 12/24/2015 22:33 12/25/2015 00:08 12/25/2015 08:26 12/25/2015 12:29  Glucose-Capillary Latest Ref Range: 65-99 mg/dL 409313 (H) 811229 (H) 914180 (H) 65 225 (H)    Diabetes history: Type 2 diabetes Outpatient Diabetes medications: Lantus 25 units daily, Glucotrol 10 mg, Metformin 1000 mg bid Current orders for Inpatient glycemic control:  Lantus 15 units q HS, Novolog sensitive tid with meals  Inpatient Diabetes Program Recommendations:    Please consider adding Novolog meal coverage 4 units tid with meals-hold if patient eats less than 50%.  A1C pending.  Likely needs increase of Lantus to home dose as well.   Thanks, Beryl MeagerJenny Cordero Surette, RN, BC-ADM Inpatient Diabetes Coordinator Pager [phone] (8a-5p)

## 2015-12-25 NOTE — Progress Notes (Signed)
   Subjective:  Patient reports pain as moderate.    Objective:   VITALS:   Filed Vitals:   12/24/15 2230 12/24/15 2236 12/24/15 2259 12/25/15 0648  BP:   124/91 90/63  Pulse:   102 103  Temp:  97.2 F (36.2 C) 97.3 F (36.3 C) 100.2 F (37.9 C)  TempSrc:   Oral   Resp: 15  17 17   Height:   5\' 7"  (1.702 m)   Weight:   49.76 kg (109 lb 11.2 oz)   SpO2:   92% 98%    Swelling improved VAC in place   Lab Results  Component Value Date   WBC 16.3* 12/25/2015   HGB 12.3* 12/25/2015   HCT 36.5* 12/25/2015   MCV 79.3 12/25/2015   PLT 215 12/25/2015     Assessment/Plan:  1 Day Post-Op   - Expected postop acute blood loss anemia - will monitor for symptoms - Up with PT/OT - DVT ppx - SCDs, ambulation, lovenox - NWB operative extremity - Pain control - will take back to surgery for repeat I&D thurs vs Friday - cultures pending  Cheral AlmasXu, Coralee Edberg Michael 12/25/2015, 7:48 AM [phone]

## 2015-12-25 NOTE — Anesthesia Postprocedure Evaluation (Signed)
Anesthesia Post Note  Patient: Raymond Winters  Procedure(s) Performed: Procedure(s) (LRB):  DEBRIDEMENT OF MUSCLE, SUBCUTANEOUS TISSUE LEFT SHOULDER (Left) APPLICATION OF WOUND VAC; LEFT SHOULDER (Left)  Patient location during evaluation: PACU Anesthesia Type: General Level of consciousness: awake and alert Pain management: pain level controlled Vital Signs Assessment: post-procedure vital signs reviewed and stable Respiratory status: spontaneous breathing, nonlabored ventilation, respiratory function stable and patient connected to nasal cannula oxygen Cardiovascular status: blood pressure returned to baseline and stable Postop Assessment: no signs of nausea or vomiting Anesthetic complications: no    Last Vitals:  Filed Vitals:   12/24/15 2236 12/24/15 2259  BP:  124/91  Pulse:  102  Temp: 36.2 C 36.3 C  Resp:  17    Last Pain:  Filed Vitals:   12/25/15 0248  PainSc: 4                  Wandalene Abrams S

## 2015-12-25 NOTE — Progress Notes (Signed)
Consulted infection prevention nurse regarding bed bugs as reported by night nurse. Pt was on contact precautions for the bed bugs but precautions discontinued as there is no need for that precaution per infection prevention nurse. Advised to educate family members on hygiene and washing clothes thoroughly to prevent transmission of these insects

## 2015-12-25 NOTE — Progress Notes (Signed)
Paged on-call FMTS Intern due to patient's c/o pain, 7/10 at left shoulder from I & D and wound VAC placement last night done by [facility] and no PRN pain medication ordered.  Awaiting for orders and will endorse to day shift RN appropriately.

## 2015-12-25 NOTE — Progress Notes (Signed)
Family Medicine Teaching Service Daily Progress Note Intern Pager: [phone]  Patient name: Raymond Winters Medical record number: [id number] Date of birth: September 18, 1960 Age: 55 y.o. Gender: male  Primary Care Provider: Ambrose FinlandValerie A Keck, NP Consultants: Orthopedics Code Status: Full  Pt Overview and Major Events to Date:  7/4 admitted for L shoulder pain  Assessment and Plan: Raymond Winters is a 55 y.o. male presenting with left deltoid soft tissue abscess. PMH is significant for heroin abuse, T2DM, HLD, HTN.   Left Deltoid Abscess. 17x7cm abscess in left shoulder found on CT. Secondary to injection on heroin into left deltoid. No evidence of osteomyelitis on CT. Mild leukocytosis (WBC 13.0). Vitals stable with no other signs of systemic infection. On vanc/zosyn (started 7/4) - Per Ortho - return to OR for repeat I&D on Friday (7/7). - Continue vanc/zosyn - f/u blood cultures pending - wound cultures pending: prelim aerobin/anaerobic gram pos cocci in pairs and gram variable rod - up with PT/OT - pain control on norco   Heroin Abuse. Daily user for several years. HIV negative in 2015. HCV positive (see below problem). Also with "occasional" alcohol use.  - CIWA - HIV pending - Social work consult.   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 No documentation of treatment for this. LFTs (AST34 ALT19 tbili 0.8) and INR(1.42) wnl - Will need follow up with ID (has not seen them since 2014) - monitor CMP/INR - UDS pos for opiates and cocaine  Hyponatremia. Na 127 on admission. Possibly secondary to dehydration/malnutrition (appears dry on exam). Now 133 - NS @ 100cc/hr overnight - Trend BMP  T2DM. On lantus 25U nightly at home. A1c 12.7 in January. - Continue lantus to 15U - SSI - A1c pending  HLD. - Holding home pravastatin, consider restart given LFTs wnl  FEN/GI: carb modified diet afterwards, NS @100cc /hr Prophylaxis: Lovenox. Scds. ambulation  Disposition: Admitted to  med surg pending above management.   Subjective:  Patient states he feels hungry today, has had BM and had not yet received norco this am. States had appt with ID today was concerned about missing it. Denies symptoms of withdrawal including chills, palpitations, sweats, weakness, n/v or diarrhea. Has no other questions or concerns.  Objective: Temp:  [97.2 F (36.2 C)-100.2 F (37.9 C)] 100.2 F (37.9 C) (07/05 0648) Pulse Rate:  [59-103] 103 (07/05 0648) Resp:  [10-22] 17 (07/05 0648) BP: (90-178)/(63-100) 90/63 mmHg (07/05 0648) SpO2:  [92 %-100 %] 98 % (07/05 0648) Weight:  [109 lb 11.2 oz (49.76 kg)] 109 lb 11.2 oz (49.76 kg) (07/04 2259)   Physical Exam: General: Thin, chronically ill appearing male in NAD lying in hospital bed Eyes: Mild scleral icterus noted, PERRL ENTM: moist mucus membranes Cardiovascular: Distant heart sounds. No murmurs appreciated Respiratory: NWOB, CTAB Abdomen: Thin. +BS, S, NT, ND MSK: L deltoid area with wound vac C/D/I draining serosanguinous fluid Skin: Warm, dry Neuro: Alert and conversational. No focal deficits Psych: Appropriate mood and affect.  Laboratory:  Recent Labs Lab 12/24/15 1303 12/24/15 1309 12/25/15 0124  WBC 13.0*  --  16.3*  HGB 11.6* 12.6* 12.3*  HCT 34.2* 37.0* 36.5*  PLT 229  --  215    Recent Labs Lab 12/24/15 1309 12/25/15 0124  NA 127* 133*  K 4.0 3.9  CL 90* 98*  CO2  --  23  BUN 17 14  CREATININE 1.10 1.09  CALCIUM  --  7.9*  PROT  --  6.1*  BILITOT  --  0.8  ALKPHOS  --  82  ALT  --  19  AST  --  34  GLUCOSE 306* 99     Imaging/Diagnostic Tests: Ct Shoulder Left W Contrast  12/24/2015  CLINICAL DATA:  Left shoulder pain and swelling for approximately 7 days, after injecting heroin in shoulder. EXAM: CT OF THE LEFT SHOULDER WITH CONTRAST TECHNIQUE: Multidetector CT imaging was performed following the standard protocol during bolus administration of intravenous contrast. CONTRAST:  1 ISOVUE-300  IOPAMIDOL (ISOVUE-300) INJECTION 61% COMPARISON:  None. FINDINGS: Complex collection of fluid and air posterior to the left shoulder and proximal left humerus, almost certainly representing abscess, measuring at least 17 cm craniocaudal dimension (incompletely imaged at the lower aspects of the exam), 6 cm AP dimension and approximately 7 cm greatest transverse dimension. No evidence of extension into the joint space seen. The underlying osseous structures appear intact and normal in mineralization throughout. No destructive changes to suggest underlying osteomyelitis. No fracture. IMPRESSION: 1. Soft tissue abscess collection posterior to the left shoulder and proximal left humerus, incompletely imaged at the lower aspects of the exam, measuring at least 17 cm craniocaudal dimension and approximately 7 cm in both the AP and transverse dimensions. 2. No evidence of abscess extension into the glenohumeral joint space. 3. No osseous abnormality seen.  No evidence of osteomyelitis. Electronically Signed   By: Bary RichardStan  Maynard M.D.   On: 12/24/2015 15:11   Dg Chest Portable 1 View  12/24/2015  CLINICAL DATA:  Preoperative evaluation.  Hypertension. EXAM: PORTABLE CHEST 1 VIEW COMPARISON:  Chest radiograph September 15, 2015; chest CT September 15, 2015 FINDINGS: Mild patchy opacity in the left base is consistent with either small focus of pneumonia currently or residual scarring from pneumonia noted on prior examinations in the left base. Lungs elsewhere clear. Heart size and pulmonary vascularity are normal. No adenopathy. There is evidence of lucency in a portion of the right femoral head, concerning for potential avascular necrosis. Mottled appearance is noted in the left proximal humeral epiphysis and metaphysis. IMPRESSION: Question scarring left base versus focal pneumonia in this area. Both entities may exist concurrently. Lungs elsewhere clear. Cardiac silhouette normal. 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 Mottled appearance of the left proximal humerus is of uncertain etiology. Osteomyelitis in the proximal left humerus must be of concern. This finding may warrant MR or nuclear medicine three-phase bone scan to further assess. Electronically Signed   By: Bretta BangWilliam  Woodruff III M.D.   On: 12/24/2015 16:06    Leland HerElsia J Jamilynn Whitacre, DO 12/25/2015, 7:59 AM PGY-1, [place] [facility] FPTS Intern pager: [phone], text pages welcome

## 2015-12-26 ENCOUNTER — Inpatient Hospital Stay (HOSPITAL_COMMUNITY): Payer: Self-pay

## 2015-12-26 DIAGNOSIS — Z794 Long term (current) use of insulin: Secondary | ICD-10-CM | POA: Insufficient documentation

## 2015-12-26 DIAGNOSIS — R609 Edema, unspecified: Secondary | ICD-10-CM

## 2015-12-26 DIAGNOSIS — E118 Type 2 diabetes mellitus with unspecified complications: Secondary | ICD-10-CM | POA: Insufficient documentation

## 2015-12-26 LAB — GLUCOSE, CAPILLARY
GLUCOSE-CAPILLARY: 160 mg/dL — AB (ref 65–99)
GLUCOSE-CAPILLARY: 222 mg/dL — AB (ref 65–99)
GLUCOSE-CAPILLARY: 394 mg/dL — AB (ref 65–99)
Glucose-Capillary: 258 mg/dL — ABNORMAL HIGH (ref 65–99)

## 2015-12-26 LAB — VANCOMYCIN, TROUGH: VANCOMYCIN TR: 12 ug/mL — AB (ref 15–20)

## 2015-12-26 MED ORDER — ENSURE ENLIVE PO LIQD
237.0000 mL | Freq: Three times a day (TID) | ORAL | Status: DC
  Administered 2015-12-26 – 2015-12-29 (×6): 237 mL via ORAL

## 2015-12-26 NOTE — Progress Notes (Signed)
Plan for surgery tomorrow for repeat I&D and possible closure NPO after midnight

## 2015-12-26 NOTE — Progress Notes (Signed)
VASCULAR LAB PRELIMINARY  PRELIMINARY  PRELIMINARY  PRELIMINARY  Left upper extremity venous duplex completed.    Preliminary report:  There is no DVT or SVT noted in the left upper extremity.  There is significant interstitial fluid noted throughout the left upper extremity.   Travion Ke, RVT 12/26/2015, 6:24 PM

## 2015-12-26 NOTE — Progress Notes (Signed)
Initial Nutrition Assessment  DOCUMENTATION CODES:   Severe malnutrition in context of social or environmental circumstances, Underweight  INTERVENTION:   -Increase Ensure Enlive po to TID, each supplement provides 350 kcal and 20 grams of protein -MVI daily  NUTRITION DIAGNOSIS:   Malnutrition related to social / environmental circumstances as evidenced by severe depletion of body fat, severe depletion of muscle mass, percent weight loss.  GOAL:   Patient will meet greater than or equal to 90% of their needs  MONITOR:   PO intake, Supplement acceptance, Labs, Weight trends, Skin, I & O's  REASON FOR ASSESSMENT:   Malnutrition Screening Tool    ASSESSMENT:   Raymond Winters is a 55 y.o. male presenting with left deltoid soft tissue abscess. PMH is significant for heroin abuse, T2DM, HLD, HTN.   Pt admitted with lt deltoid abscess. Toxicology report positive for opiates and cocaine.   S/p PROCEDURE 12/24/15: 1. Debridement of muscle, subcutaneous tissue, skin 19 x 10 x 2 cm 2. Wound vac placement <50 sq cm  Hx obtained from pt at bedside. He reports experiencing poor appetite and weight loss over the past month. Typical meal intake is 3 meals per day, but pt reports he has not been able to eat much due to very poor appetite. He shares that frequently consumed foods have consisted of cereal, chicken noodle soup, and corn on the cob. Since being hospitalized appetite has improved and he reports eating 100% of his breakfast today. He also enjoys Ensure supplements.   Pt estimates he has lost 30# within the past 1-2 months. Weight hx reviewed. Noted a 15.5% wt loss over the past 6 months, which is significant for time frame.   Nutrition-Focused physical exam completed. Findings are moderate to severe fat depletion, moderate to severe muscle depletion, and no edema.   Pt with wound vac to lt shoulder. Discussed importance of good meal and supplement intake (especially protein) to support  lean body mass preservation and wound healing. Pt amenable to continue supplements.   Labs reviewed: CBGS: 65-259.  Diet Order:  Diet Heart Room service appropriate?: Yes; Fluid consistency:: Thin  Skin:  Wound (see comment) (wound vac lt shoulder)  Last BM:  12/26/15  Height:   Ht Readings from Last 1 Encounters:  12/24/15 5\' 7"  (1.702 m)    Weight:   Wt Readings from Last 1 Encounters:  12/24/15 109 lb 11.2 oz (49.76 kg)    Ideal Body Weight:  67.3 kg  BMI:  Body mass index is 17.18 kg/(m^2).  Estimated Nutritional Needs:   Kcal:  1500-1700  Protein:  75-90 grams  Fluid:  >1.5 L  EDUCATION NEEDS:   Education needs addressed  Murrell Dome A. Mayford KnifeWilliams, RD, LDN, CDE Pager: [phone] After hours Pager: [phone]

## 2015-12-26 NOTE — Progress Notes (Signed)
Pharmacy Antibiotic Note  Raymond ButteryGary Winters is a 55 y.o. male admitted on 12/24/2015 with left shoulder abscess due to IV drug use now s/p debridement and wound VAC placement. Pharmacy has been consulted for Vancomycin and Zosyn dosing.  Planning repeat I+D 7/7 Vancomycin trough this evening therapeutic at 12 mcg / dl  Blood cultures negative to date Abscess culture with GPC, GR   Plan: Continue Zosyn 3.375 grams iv Q 8 hours - 4 hr infusion Continue Vancomycin 500 mg iv Q 12 hours Follow up antibiotic plan  Height: 5\' 7"  (170.2 cm) Weight: 109 lb 11.2 oz (49.76 kg) IBW/kg (Calculated) : 66.1  Temp (24hrs), Avg:97.8 F (36.6 C), Min:97.5 F (36.4 C), Max:98.4 F (36.9 C)   Recent Labs Lab 12/24/15 1303 12/24/15 1309 12/24/15 1632 12/25/15 0124 12/26/15 1929  WBC 13.0*  --   --  16.3*  --   CREATININE  --  1.10  --  1.09  --   LATICACIDVEN  --  2.51* 2.67*  --   --   VANCOTROUGH  --   --   --   --  12*    Estimated Creatinine Clearance: 53.3 mL/min (by C-G formula based on Cr of 1.09).    No Known Allergies  Thank you for allowing pharmacy to be a part of this patient's care. Raymond RegalLisa Takasha Winters, PharmD [phone] 12/26/2015 8:40 PM

## 2015-12-26 NOTE — Progress Notes (Signed)
Family Medicine Teaching Service Daily Progress Note Intern Pager: [phone]  Patient name: Raymond Winters Ilyas Medical record number: [id number] Date of birth: 1960-07-14 Age: 55 y.o. Gender: male  Primary Care Provider: No PCP Per Patient Consultants: Orthopedics Code Status: Full  Pt Overview and Major Events to Date:  7/4 admitted for L shoulder pain  Assessment and Plan: Raymond Winters Berenguer is a 55 y.o. male presenting with left deltoid soft tissue abscess. PMH is significant for heroin abuse, T2DM, HLD, HTN.   Left Deltoid Abscess. 17x7cm abscess in left shoulder found on CT. Secondary to injection on heroin into left deltoid. No evidence of osteomyelitis on CT. Mild leukocytosis (WBC 13.0). Vitals stable with no other signs of systemic infection. On vanc/zosyn (started 7/4) - Per Ortho - return to OR for repeat I&D on Friday (7/7). - Continue vanc/zosyn - f/u blood cultures pending - wound cultures pending: prelim aerobin/anaerobic gram pos cocci in pairs and gram variable rod - up with PT/OT - pain control on norco   Heroin Abuse. Daily user for several years. HIV negative in 2015. HCV positive (see below problem). Also with "occasional" alcohol use.  - CIWA - HIV nonreactuive - Social work consult.   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 No documentation of treatment for this. LFTs (AST34 ALT19 tbili 0.8) and INR(1.42) wnl - Will need follow up with ID (has not seen them since 2014) - monitor CMP/INR - UDS pos for opiates and cocaine  Hyponatremia. Na 127 on admission. Possibly secondary to dehydration/malnutrition (appears dry on exam). Now 133 - NS @ 100cc/hr overnight - Trend BMP  T2DM. On lantus 25U nightly at home. A1c 12.7 in January. - Continue lantus to 15U - SSI - A1c pending  HLD. - Holding home pravastatin, consider restart given LFTs wnl  FEN/GI: carb modified diet afterwards, NS @100cc /hr Prophylaxis: Lovenox. Scds. ambulation  Disposition:  Admitted to med surg pending above management.   Subjective:  Patient states fe feels well and has good pain control. States eating well and moving bowels well. Has no questions or concerns today  Objective: Temp:  [97.5 F (36.4 C)-99 F (37.2 C)] 97.5 F (36.4 C) (07/06 0512) Pulse Rate:  [62-83] 62 (07/06 0512) Resp:  [16] 16 (07/06 0512) BP: (85-103)/(55-76) 102/65 mmHg (07/06 0512) SpO2:  [100 %] 100 % (07/06 0512)   Physical Exam:  General: Thin, chronically ill appearing male in NAD lying in hospital bed Eyes: Mild scleral icterus noted, PERRL ENTM: moist mucus membranes Cardiovascular: Distant heart sounds. No murmurs appreciated Respiratory: NWOB, CTAB Abdomen: Thin. +BS, S, NT, ND MSK: L deltoid area with wound vac C/D/I draining serosanguinous fluid Skin: Warm, dry Neuro: Alert and conversational. No focal deficits Psych: Appropriate mood and affect.  Laboratory:  Recent Labs Lab 12/24/15 1303 12/24/15 1309 12/25/15 0124  WBC 13.0*  --  16.3*  HGB 11.6* 12.6* 12.3*  HCT 34.2* 37.0* 36.5*  PLT 229  --  215    Recent Labs Lab 12/24/15 1309 12/25/15 0124  NA 127* 133*  K 4.0 3.9  CL 90* 98*  CO2  --  23  BUN 17 14  CREATININE 1.10 1.09  CALCIUM  --  7.9*  PROT  --  6.1*  BILITOT  --  0.8  ALKPHOS  --  82  ALT  --  19  AST  --  34  GLUCOSE 306* 99     Imaging/Diagnostic Tests: No results found.  Leland HerElsia J Raylene Carmickle, DO 12/26/2015,  8:43 AM PGY-1, [facility] FPTS Intern pager: [phone], text pages welcome

## 2015-12-26 NOTE — Progress Notes (Signed)
   12/26/15 1100  Clinical Encounter Type  Visited With Patient and family together  Visit Type Spiritual support  Referral From Chaplain  Consult/Referral To Chaplain  Spiritual Encounters  Spiritual Needs Prayer;Emotional  Stress Factors  Patient Stress Factors Exhausted;Health changes;Loss of control  Family Stress Factors Health changes  Chaplain responded to consult request, patient feeling down, provided therapeutic listening, emotional support, and postoral presence, prayer and words of encouragement.  Advised that continued support services are available 24/7 upon request.

## 2015-12-26 NOTE — Progress Notes (Signed)
Patient complaining of left lower arm swelling. Denies pain/tingling/numbness. +2 radial pulse. MD paged to make aware.

## 2015-12-27 ENCOUNTER — Inpatient Hospital Stay (HOSPITAL_COMMUNITY): Payer: Self-pay | Admitting: Certified Registered Nurse Anesthetist

## 2015-12-27 ENCOUNTER — Encounter (HOSPITAL_COMMUNITY)
Admission: EM | Disposition: A | Discharge status: Home / Self Care | Payer: Self-pay | Source: Home / Self Care | Attending: Family Medicine

## 2015-12-27 ENCOUNTER — Inpatient Hospital Stay (HOSPITAL_COMMUNITY): Payer: MEDICAID | Admitting: Certified Registered Nurse Anesthetist

## 2015-12-27 DIAGNOSIS — R609 Edema, unspecified: Secondary | ICD-10-CM | POA: Insufficient documentation

## 2015-12-27 HISTORY — PX: IRRIGATION AND DEBRIDEMENT SHOULDER: SHX5880

## 2015-12-27 LAB — GLUCOSE, CAPILLARY
GLUCOSE-CAPILLARY: 164 mg/dL — AB (ref 65–99)
GLUCOSE-CAPILLARY: 44 mg/dL — AB (ref 65–99)
GLUCOSE-CAPILLARY: 36 mg/dL — AB (ref 65–99)
GLUCOSE-CAPILLARY: 63 mg/dL — AB (ref 65–99)
GLUCOSE-CAPILLARY: 122 mg/dL — AB (ref 65–99)
GLUCOSE-CAPILLARY: 349 mg/dL — AB (ref 65–99)
Glucose-Capillary: 261 mg/dL — ABNORMAL HIGH (ref 65–99)

## 2015-12-27 LAB — HEMOGLOBIN A1C
HEMOGLOBIN A1C: 11.1 % — AB (ref 4.8–5.6)
MEAN PLASMA GLUCOSE: 272 mg/dL

## 2015-12-27 LAB — SURGICAL PCR SCREEN
MRSA, PCR: NEGATIVE
STAPHYLOCOCCUS AUREUS: NEGATIVE

## 2015-12-27 SURGERY — IRRIGATION AND DEBRIDEMENT SHOULDER
Anesthesia: General | Laterality: Left

## 2015-12-27 MED ORDER — DEXTROSE 50 % IV SOLN
25.0000 mL | Freq: Once | INTRAVENOUS | Status: AC
  Administered 2015-12-27: 25 mL via INTRAVENOUS

## 2015-12-27 MED ORDER — OXYCODONE HCL 5 MG PO TABS: 5.0000 mg | ORAL_TABLET | Freq: Once | ORAL | Status: DC | PRN

## 2015-12-27 MED ORDER — SODIUM CHLORIDE 0.9 % IR SOLN
Status: DC | PRN
  Administered 2015-12-27: 1000 mL
  Administered 2015-12-27 (×2): 3000 mL

## 2015-12-27 MED ORDER — ONDANSETRON HCL 4 MG/2ML IJ SOLN
INTRAMUSCULAR | Status: AC
  Filled 2015-12-27: qty 2

## 2015-12-27 MED ORDER — MIDAZOLAM HCL 2 MG/2ML IJ SOLN
INTRAMUSCULAR | Status: AC
  Filled 2015-12-27: qty 2

## 2015-12-27 MED ORDER — ACETAMINOPHEN 325 MG PO TABS: 325.0000 mg | ORAL_TABLET | ORAL | Status: DC | PRN

## 2015-12-27 MED ORDER — MIDAZOLAM HCL 5 MG/5ML IJ SOLN
INTRAMUSCULAR | Status: DC | PRN
  Administered 2015-12-27: 2 mg via INTRAVENOUS

## 2015-12-27 MED ORDER — LIDOCAINE HCL (CARDIAC) 20 MG/ML IV SOLN
INTRAVENOUS | Status: DC | PRN
  Administered 2015-12-27: 50 mg via INTRAVENOUS

## 2015-12-27 MED ORDER — INSULIN ASPART 100 UNIT/ML ~~LOC~~ SOLN: 0.0000 [IU] | Freq: Every day | SUBCUTANEOUS | Status: DC

## 2015-12-27 MED ORDER — PHENYLEPHRINE 40 MCG/ML (10ML) SYRINGE FOR IV PUSH (FOR BLOOD PRESSURE SUPPORT)
PREFILLED_SYRINGE | INTRAVENOUS | Status: AC
  Filled 2015-12-27: qty 10

## 2015-12-27 MED ORDER — HYDROMORPHONE HCL 1 MG/ML IJ SOLN
INTRAMUSCULAR | Status: AC
  Administered 2015-12-27: 0.5 mg via INTRAVENOUS
  Filled 2015-12-27: qty 1

## 2015-12-27 MED ORDER — INSULIN ASPART 100 UNIT/ML ~~LOC~~ SOLN
3.0000 [IU] | Freq: Three times a day (TID) | SUBCUTANEOUS | Status: DC
  Administered 2015-12-28 – 2015-12-31 (×6): 3 [IU] via SUBCUTANEOUS

## 2015-12-27 MED ORDER — DEXTROSE 50 % IV SOLN
INTRAVENOUS | Status: AC
  Administered 2015-12-27: 09:00:00
  Filled 2015-12-27: qty 50

## 2015-12-27 MED ORDER — FENTANYL CITRATE (PF) 100 MCG/2ML IJ SOLN
INTRAMUSCULAR | Status: DC | PRN
  Administered 2015-12-27: 50 ug via INTRAVENOUS
  Administered 2015-12-27: 100 ug via INTRAVENOUS

## 2015-12-27 MED ORDER — PHENYLEPHRINE HCL 10 MG/ML IJ SOLN
INTRAMUSCULAR | Status: DC | PRN
  Administered 2015-12-27 (×3): 120 ug via INTRAVENOUS
  Administered 2015-12-27: 80 ug via INTRAVENOUS

## 2015-12-27 MED ORDER — INSULIN ASPART 100 UNIT/ML ~~LOC~~ SOLN: 0.0000 [IU] | Freq: Three times a day (TID) | SUBCUTANEOUS | Status: DC

## 2015-12-27 MED ORDER — ROCURONIUM BROMIDE 50 MG/5ML IV SOLN
INTRAVENOUS | Status: AC
  Filled 2015-12-27: qty 1

## 2015-12-27 MED ORDER — ONDANSETRON HCL 4 MG/2ML IJ SOLN
INTRAMUSCULAR | Status: DC | PRN
  Administered 2015-12-27: 4 mg via INTRAVENOUS

## 2015-12-27 MED ORDER — SUCCINYLCHOLINE CHLORIDE 20 MG/ML IJ SOLN
INTRAMUSCULAR | Status: DC | PRN
  Administered 2015-12-27: 50 mg via INTRAVENOUS

## 2015-12-27 MED ORDER — ACETAMINOPHEN 160 MG/5ML PO SOLN: 325.0000 mg | ORAL | Status: DC | PRN

## 2015-12-27 MED ORDER — DEXTROSE 50 % IV SOLN: 1.0000 | Freq: Once | INTRAVENOUS | Status: AC

## 2015-12-27 MED ORDER — INSULIN GLARGINE 100 UNIT/ML ~~LOC~~ SOLN
12.0000 [IU] | Freq: Every day | SUBCUTANEOUS | Status: DC
  Administered 2015-12-27: 12 [IU] via SUBCUTANEOUS
  Filled 2015-12-27 (×2): qty 0.12

## 2015-12-27 MED ORDER — PROPOFOL 10 MG/ML IV BOLUS
INTRAVENOUS | Status: AC
  Filled 2015-12-27: qty 20

## 2015-12-27 MED ORDER — PROPOFOL 10 MG/ML IV BOLUS
INTRAVENOUS | Status: DC | PRN
  Administered 2015-12-27: 150 mg via INTRAVENOUS

## 2015-12-27 MED ORDER — HYDROMORPHONE HCL 1 MG/ML IJ SOLN
0.2500 mg | INTRAMUSCULAR | Status: DC | PRN
  Administered 2015-12-27 (×4): 0.5 mg via INTRAVENOUS

## 2015-12-27 MED ORDER — DEXTROSE 50 % IV SOLN
INTRAVENOUS | Status: AC
  Administered 2015-12-27: 25 mL via INTRAVENOUS
  Filled 2015-12-27: qty 50

## 2015-12-27 MED ORDER — FENTANYL CITRATE (PF) 250 MCG/5ML IJ SOLN
INTRAMUSCULAR | Status: AC
  Filled 2015-12-27: qty 5

## 2015-12-27 MED ORDER — LACTATED RINGERS IV SOLN
INTRAVENOUS | Status: DC
  Administered 2015-12-27 – 2015-12-29 (×3): via INTRAVENOUS

## 2015-12-27 MED ORDER — OXYCODONE HCL 5 MG/5ML PO SOLN: 5.0000 mg | Freq: Once | ORAL | Status: DC | PRN

## 2015-12-27 SURGICAL SUPPLY — 64 items
BANDAGE ELASTIC 3 VELCRO ST LF (GAUZE/BANDAGES/DRESSINGS) IMPLANT
BLADE SURG 10 STRL SS (BLADE) ×3 IMPLANT
BNDG COHESIVE 1X5 TAN STRL LF (GAUZE/BANDAGES/DRESSINGS) IMPLANT
BNDG COHESIVE 4X5 TAN STRL (GAUZE/BANDAGES/DRESSINGS) IMPLANT
BNDG COHESIVE 6X5 TAN STRL LF (GAUZE/BANDAGES/DRESSINGS) ×2 IMPLANT
BNDG CONFORM 3 STRL LF (GAUZE/BANDAGES/DRESSINGS) IMPLANT
BNDG GAUZE STRTCH 6 (GAUZE/BANDAGES/DRESSINGS) ×3 IMPLANT
CORDS BIPOLAR (ELECTRODE) IMPLANT
COVER SURGICAL LIGHT HANDLE (MISCELLANEOUS) ×3 IMPLANT
CUFF TOURNIQUET SINGLE 24IN (TOURNIQUET CUFF) IMPLANT
CUFF TOURNIQUET SINGLE 34IN LL (TOURNIQUET CUFF) ×2 IMPLANT
CUFF TOURNIQUET SINGLE 44IN (TOURNIQUET CUFF) IMPLANT
DRAPE EXTREMITY BILATERAL (DRAPES) IMPLANT
DRAPE IMP U-DRAPE 54X76 (DRAPES) IMPLANT
DRAPE INCISE IOBAN 66X45 STRL (DRAPES) ×6 IMPLANT
DRAPE SURG 17X23 STRL (DRAPES) IMPLANT
DRAPE U-SHAPE 47X51 STRL (DRAPES) ×3 IMPLANT
DRSG VAC ATS MED SENSATRAC (GAUZE/BANDAGES/DRESSINGS) ×2 IMPLANT
DURAPREP 26ML APPLICATOR (WOUND CARE) ×3 IMPLANT
ELECT CAUTERY BLADE 6.4 (BLADE) ×3 IMPLANT
ELECT REM PT RETURN 9FT ADLT (ELECTROSURGICAL)
ELECTRODE REM PT RTRN 9FT ADLT (ELECTROSURGICAL) IMPLANT
FACESHIELD WRAPAROUND (MASK) IMPLANT
FACESHIELD WRAPAROUND OR TEAM (MASK) IMPLANT
GAUZE SPONGE 4X4 12PLY STRL (GAUZE/BANDAGES/DRESSINGS) ×6 IMPLANT
GAUZE XEROFORM 1X8 LF (GAUZE/BANDAGES/DRESSINGS) ×1 IMPLANT
GAUZE XEROFORM 5X9 LF (GAUZE/BANDAGES/DRESSINGS) IMPLANT
GLOVE SKINSENSE NS SZ7.5 (GLOVE) ×4
GLOVE SKINSENSE STRL SZ7.5 (GLOVE) ×2 IMPLANT
GOWN STRL REIN XL XLG (GOWN DISPOSABLE) ×4 IMPLANT
HANDPIECE INTERPULSE COAX TIP (DISPOSABLE)
KIT BASIN OR (CUSTOM PROCEDURE TRAY) ×3 IMPLANT
KIT ROOM TURNOVER OR (KITS) ×3 IMPLANT
LOOP VESSEL MAXI BLUE (MISCELLANEOUS) ×3 IMPLANT
MANIFOLD NEPTUNE II (INSTRUMENTS) ×3 IMPLANT
NS IRRIG 1000ML POUR BTL (IV SOLUTION) ×4 IMPLANT
PACK ORTHO EXTREMITY (CUSTOM PROCEDURE TRAY) ×3 IMPLANT
PAD ABD 8X10 STRL (GAUZE/BANDAGES/DRESSINGS) ×1 IMPLANT
PAD ARMBOARD 7.5X6 YLW CONV (MISCELLANEOUS) ×6 IMPLANT
PAD NEG PRESSURE SENSATRAC (MISCELLANEOUS) ×2 IMPLANT
PADDING CAST ABS 4INX4YD NS (CAST SUPPLIES)
PADDING CAST ABS COTTON 4X4 ST (CAST SUPPLIES) ×2 IMPLANT
PADDING CAST COTTON 6X4 STRL (CAST SUPPLIES) ×1 IMPLANT
SET CYSTO W/LG BORE CLAMP LF (SET/KITS/TRAYS/PACK) ×2 IMPLANT
SET HNDPC FAN SPRY TIP SCT (DISPOSABLE) IMPLANT
SPONGE LAP 18X18 X RAY DECT (DISPOSABLE) ×6 IMPLANT
STAPLER VISISTAT 35W (STAPLE) ×2 IMPLANT
STOCKINETTE IMPERVIOUS 9X36 MD (GAUZE/BANDAGES/DRESSINGS) ×3 IMPLANT
SUT ETHILON 2 0 FS 18 (SUTURE) ×13 IMPLANT
SUT ETHILON 2 0 PSLX (SUTURE) ×3 IMPLANT
SUT ETHILON 3 0 PS 1 (SUTURE) ×6 IMPLANT
SUT VIC AB 2-0 CT1 36 (SUTURE) ×3 IMPLANT
SUT VIC AB 2-0 FS1 27 (SUTURE) ×6 IMPLANT
SYR CONTROL 10ML LL (SYRINGE) IMPLANT
TOWEL OR 17X24 6PK STRL BLUE (TOWEL DISPOSABLE) ×3 IMPLANT
TOWEL OR 17X26 10 PK STRL BLUE (TOWEL DISPOSABLE) ×3 IMPLANT
TUBE ANAEROBIC SPECIMEN COL (MISCELLANEOUS) IMPLANT
TUBE CONNECTING 12'X1/4 (SUCTIONS) ×1
TUBE CONNECTING 12X1/4 (SUCTIONS) ×2 IMPLANT
TUBE FEEDING 5FR 15 INCH (TUBING) IMPLANT
TUBING CYSTO DISP (UROLOGICAL SUPPLIES) ×3 IMPLANT
UNDERPAD 30X30 INCONTINENT (UNDERPADS AND DIAPERS) ×2 IMPLANT
WATER STERILE IRR 1000ML POUR (IV SOLUTION) ×1 IMPLANT
YANKAUER SUCT BULB TIP NO VENT (SUCTIONS) ×3 IMPLANT

## 2015-12-27 NOTE — Progress Notes (Signed)
3PT Cancellation Note  Patient Details Name: Raymond ButteryGary Winters MRN: [id number] DOB: [birth year]   Cancelled Treatment:    Reason Eval/Treat Not Completed: Other (comment) (recently returned from surgery. Will check back 12/28/15)   Rada HayHill, Charlet Harr Elizabeth 12/27/2015, 3:40 PM Blanchard KelchKaren Iva Posten PT [phone]

## 2015-12-27 NOTE — Progress Notes (Signed)
Returned from PACU at this time, wife at beside

## 2015-12-27 NOTE — Anesthesia Preprocedure Evaluation (Signed)
Anesthesia Evaluation  Patient identified by MRN, date of birth, ID band Patient awake    Reviewed: Allergy & Precautions, NPO status , Patient's Chart, lab work & pertinent test results  History of Anesthesia Complications Negative for: history of anesthetic complications  Airway Mallampati: II  TM Distance: >3 FB Neck ROM: Full    Dental  (+) Poor Dentition   Pulmonary neg shortness of breath, neg sleep apnea, neg COPD, neg recent URI, Current Smoker,    Pulmonary exam normal breath sounds clear to auscultation       Cardiovascular hypertension, Normal cardiovascular exam Rhythm:Regular Rate:Normal     Neuro/Psych PSYCHIATRIC DISORDERS Depression negative neurological ROS     GI/Hepatic negative GI ROS, (+)     substance abuse  alcohol use, cocaine use and IV drug use,   Endo/Other  diabetes, Insulin Dependent  Renal/GU negative Renal ROS  negative genitourinary   Musculoskeletal negative musculoskeletal ROS (+)   Abdominal   Peds negative pediatric ROS (+)  Hematology negative hematology ROS (+) anemia ,   Anesthesia Other Findings   Reproductive/Obstetrics negative OB ROS                             Anesthesia Physical Anesthesia Plan  ASA: III  Anesthesia Plan: General   Post-op Pain Management:    Induction: Intravenous  Airway Management Planned: Oral ETT  Additional Equipment: None  Intra-op Plan:   Post-operative Plan: Extubation in OR  Informed Consent: I have reviewed the patients History and Physical, chart, labs and discussed the procedure including the risks, benefits and alternatives for the proposed anesthesia with the patient or authorized representative who has indicated his/her understanding and acceptance.   Dental advisory given  Plan Discussed with: CRNA and Surgeon  Anesthesia Plan Comments:         Anesthesia Quick Evaluation

## 2015-12-27 NOTE — Progress Notes (Signed)
PT Cancellation Note  Patient Details Name: Larinda ButteryGary Radman MRN: [id number] DOB: October 08, 1960   Cancelled Treatment:    Reason Eval/Treat Not Completed: Medical issues which prohibited therapy (patient  declined to mobilize at this time and requested to start after surgery. will check back later today.)   Sharen HeckHill, Ubah Radke Elizabeth Manaal Mandala PT [phone]  12/27/2015, 8:53 AM

## 2015-12-27 NOTE — Anesthesia Procedure Notes (Signed)
Procedure Name: Intubation Date/Time: 12/27/2015 10:48 AM Performed by: Little IshikawaMERCER, Kadey Mihalic L Pre-anesthesia Checklist: Patient identified, Emergency Drugs available, Suction available, Patient being monitored and Timeout performed Patient Re-evaluated:Patient Re-evaluated prior to inductionOxygen Delivery Method: Circle system utilized Preoxygenation: Pre-oxygenation with 100% oxygen Intubation Type: IV induction Ventilation: Mask ventilation without difficulty Laryngoscope Size: Mac and 4 Grade View: Grade II Tube type: Oral Tube size: 7.5 mm Number of attempts: 1 Airway Equipment and Method: Stylet Placement Confirmation: ETT inserted through vocal cords under direct vision,  positive ETCO2 and breath sounds checked- equal and bilateral Secured at: 22 cm Tube secured with: Tape Dental Injury: Teeth and Oropharynx as per pre-operative assessment  Difficulty Due To: Difficult Airway- due to anterior larynx

## 2015-12-27 NOTE — Progress Notes (Signed)
   12/27/15 1600  Clinical Encounter Type  Visited With Patient  Visit Type Follow-up  Referral From Patient  Consult/Referral To Chaplain  Spiritual Encounters  Spiritual Needs Prayer;Emotional  Stress Factors  Patient Stress Factors Health changes;Loss of control  Chaplain f/u visit made per patient request, patient in better spirits today, post surgical soreness, provided emotional support, pastoral presence and spiritual support, as well as prayer.  Chaplain will be available as needed for continued support services.

## 2015-12-27 NOTE — Discharge Summary (Signed)
[facility] Discharge Summary  Patient name: Raymond Winters Medical record number: [id number] Date of birth: 1961/04/16 Age: 55 y.o. Gender: male Date of Admission: 12/24/2015  Date of Discharge: 12/31/15 Admitting Physician: Leandrew Koyanagi, MD  Primary Care Provider: No PCP Per Patient Consultants: Orthopedics  Indication for Hospitalization: L deltoid abscess  Discharge Diagnoses/Problem List:  L deltoid abscess Heroin abuse Hepatitis C T2DM Hyponatremia HLD  Disposition: home with home nursing  Discharge Condition: Stable, improved  Discharge Exam: General: Thin, chronically ill appearing male in NAD lying in hospital bed Eyes: scleral icterus noted, EOMI ENTM: poor dentition, moist mucus membranes Cardiovascular: Distant heart sounds. No murmurs appreciated Respiratory: NWOB on room air, CTAB Abdomen: flat. +BS, S, NT, ND MSK: L deltoid area with wound vac C/D/I draining serosanguinous fluid with L lower arm edema without erythema, moderate induration at lesion site, 2+ radial pulses b/l. R arm edema without erythema, IV site not infiltrated. Skin: Warm, dry Neuro: responds to commands and questioning appropriately Psych: stable mood and affect flat.  Brief Hospital Course:  Raymond Winters is a 55 y.o. male presenting with left deltoid soft tissue abscess. PMH is significant for heroin abuse, T2DM, HLD, HTN.   Left Deltoid Abscess: Patient was found to have a 17x17cm abscess in left shoulder found on CT that did not show osteomyelitis. He was taken for incision and drainage 3 times throughout his hospital stay, had a wound vacuum placed. He was initially placed on vancomycin and zosyn but his wound cultures grew Strep viridans and anaerobes so he was discharged on augmentin. He was set up with home nursing to help manage his wound vacuum at home with plan for  wound check follow-up in 2 weeks with orthopedics and eventual plan for skin graft as an  outpatient.  Type 2 Diabetes: Patient's blood sugars were under good control throughout his hospital stay. However, when he was kept NPO before surgery he would become hypoglycemic so he was discharged on metformin only.  H/o hepatitis C: Patient's LFTs remained stable throughout his hospital stay however he missed his appointment with ID.  The remainder of his chronic medical conditions were stable and some changes were made to his home medications.  Issues for Follow Up:  1. Patient should continue wound vacuum changes with home nursing every Monday, Wednesday, Friday and follow up with Orthopedics. 2. Please manage patient's diabetes since he was discharged on metformin only. 3. Please have patient reschedule his Infectious Disease appointment.  Significant Procedures: Incision and drainage of L shoulder abscess in OR x3  Significant Labs and Imaging:   Recent Labs Lab 12/28/15 1926 12/29/15 0456 12/30/15 1033  WBC 5.0 5.1 4.2  HGB 8.0* 7.9* 7.7*  HCT 25.3* 24.1* 23.9*  PLT 207 221 222    Recent Labs Lab 12/28/15 1926 12/29/15 0456 12/30/15 0531 12/31/15 0524  NA 133* 134* 138  --   K 4.5 4.3 4.6  --   CL 101 103 106  --   CO2 '25 28 25  '$ --   GLUCOSE 271* 291* 59*  --   BUN '11 11 11  '$ --   CREATININE 0.92 0.88 0.97 1.02  CALCIUM 7.7* 7.7* 7.9*  --     Left Upper Extremity Venous Duplex Evaluation Study information:  Study status: Routine. Procedure: The left internal jugular, left subclavian, left axillary, left basilic, left cephalic, left brachial, left radial, left ulnar, right internal jugular and right subclavian veins were studied. A vascular evaluation was  performed with the patient in the supine position. The study was technically limited due to edema. Left upper extremity venous duplex study.  Doppler flow study including B-mode compression maneuvers of all visualized segments, color flow Doppler and selected views of pulsed wave Doppler.  Birthdate: Patient birthdate: 02-Oct-1960. Age: Patient is 55 yr old. Sex: Gender: male. Study date: Study date: 12/26/2015. Study time: 06:10 PM. Location: Vascular laboratory. Patient status: Inpatient.  Ct Shoulder Left W Contrast  12/24/2015 CLINICAL DATA: Left shoulder pain and swelling for approximately 7 days, after injecting heroin in shoulder. EXAM: CT OF THE LEFT SHOULDER WITH CONTRAST TECHNIQUE: Multidetector CT imaging was performed following the standard protocol during bolus administration of intravenous contrast. CONTRAST: 1 ISOVUE-300 IOPAMIDOL (ISOVUE-300) INJECTION 61% COMPARISON: None. FINDINGS: Complex collection of fluid and air posterior to the left shoulder and proximal left humerus, almost certainly representing abscess, measuring at least 17 cm craniocaudal dimension (incompletely imaged at the lower aspects of the exam), 6 cm AP dimension and approximately 7 cm greatest transverse dimension. No evidence of extension into the joint space seen. The underlying osseous structures appear intact and normal in mineralization throughout. No destructive changes to suggest underlying osteomyelitis. No fracture. IMPRESSION: 1. Soft tissue abscess collection posterior to the left shoulder and proximal left humerus, incompletely imaged at the lower aspects of the exam, measuring at least 17 cm craniocaudal dimension and approximately 7 cm in both the AP and transverse dimensions. 2. No evidence of abscess extension into the glenohumeral joint space. 3. No osseous abnormality seen. No evidence of osteomyelitis. Electronically Signed By: Bary Richard M.D. On: 12/24/2015 15:11   Dg Chest Portable 1 View  12/24/2015 CLINICAL DATA: Preoperative evaluation. Hypertension. EXAM: PORTABLE CHEST 1 VIEW COMPARISON: Chest radiograph September 15, 2015; chest CT September 15, 2015 FINDINGS: Mild patchy opacity in the left base is consistent with either small focus of pneumonia currently or  residual scarring from pneumonia noted on prior examinations in the left base. Lungs elsewhere clear. Heart size and pulmonary vascularity are normal. No adenopathy. There is evidence of lucency in a portion of the right femoral head, concerning for potential avascular necrosis. Mottled appearance is noted in the left proximal humeral epiphysis and metaphysis. IMPRESSION: Question scarring left base versus focal pneumonia in this area. Both entities may exist concurrently. Lungs elsewhere clear. Cardiac silhouette normal. 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 Mottled appearance of the left proximal humerus is of uncertain etiology. Osteomyelitis in the proximal left humerus must be of concern. This finding may warrant MR or or nuclear medicine three-phase bone scan to further assess. Electronically Signed By: Bretta Bang III M.D. On: 12/24/2015 16:06    Results/Tests Pending at Time of Discharge: none    Discharge Medications:    Medication List    STOP taking these medications        cephALEXin 500 MG capsule  Commonly known as:  KEFLEX     glucose blood test strip  Commonly known as:  TRUE METRIX BLOOD GLUCOSE TEST     glucose monitoring kit monitoring kit     Insulin Glargine 100 UNIT/ML Solostar Pen  Commonly known as:  LANTUS SOLOSTAR     naproxen 500 MG tablet  Commonly known as:  NAPROSYN     traMADol 50 MG tablet  Commonly known as:  ULTRAM     TRUEPLUS LANCETS 28G Misc      TAKE these medications        amoxicillin-clavulanate 875-125  MG tablet  Commonly known as:  AUGMENTIN  Take 1 tablet by mouth 2 (two) times daily.     aspirin EC 81 MG tablet  Take 1 tablet (81 mg total) by mouth daily.     gabapentin 300 MG capsule  Commonly known as:  NEURONTIN  Take 1 capsule (300 mg total) by mouth at bedtime. For diabetic nerve pain     glipiZIDE 10 MG tablet  Commonly known as:  GLUCOTROL  Take 10 mg by mouth daily before  breakfast.     HYDROcodone-acetaminophen 5-325 MG tablet  Commonly known as:  NORCO/VICODIN  Take 1-2 tablets by mouth every 6 (six) hours as needed.     ibuprofen 800 MG tablet  Commonly known as:  ADVIL,MOTRIN  Take 1 tablet (800 mg total) by mouth 3 (three) times daily.     meloxicam 15 MG tablet  Commonly known as:  MOBIC  Take 1 tablet (15 mg total) by mouth daily.     metFORMIN 1000 MG tablet  Commonly known as:  GLUCOPHAGE  Take 1 tablet (1,000 mg total) by mouth 2 (two) times daily with a meal.     mirtazapine 15 MG tablet  Commonly known as:  REMERON  TAKE 1 TABLET BY MOUTH AT BEDTIME.     multivitamin with minerals Tabs tablet  Take 1 tablet by mouth daily.     pravastatin 20 MG tablet  Commonly known as:  PRAVACHOL  Take 1 tablet (20 mg total) by mouth daily.     Vitamin D-3 1000 units Caps  Take 1,000 Units by mouth daily.        Discharge Instructions: Please refer to Patient Instructions section of EMR for full details.  Patient was counseled important signs and symptoms that should prompt return to medical care, changes in medications, dietary instructions, activity restrictions, and follow up appointments.   Follow-Up Appointments: Follow-up Information    Follow up with Marianna Payment, MD In 2 weeks.   Specialty:  Orthopedic Surgery   Why:  For wound re-check.  Make appointment for Monday, Wednesday, or Friday   Contact information:   [place] [address] [phone]       Follow up with [facility].   Why:  January 20, 2016 at 10 am    Contact information:   [address] [place] [address] [phone]      Follow up with [facility].   Why:  home health RN   Contact information:   [place] [address] [place], DO 01/01/2016, 2:26 PM PGY-1, [place]

## 2015-12-27 NOTE — Op Note (Signed)
   Date of Surgery: 12/27/2015  INDICATIONS: [name] is a 55 y.o.-year-old male with a left shoulder abscess from IV drug use;  The patient did consent to the procedure after discussion of the risks and benefits.  PREOPERATIVE DIAGNOSIS: Left shoulder deep, complex intramuscular and submuscular abscess  POSTOPERATIVE DIAGNOSIS: Same.  PROCEDURE: 1. Debridement of muscle, subcutaneous tissue, skin 19 x 15 x 5 cm 2. Wound vac placement >50 sq cm  SURGEON: N. Glee ArvinMichael Xu, M.D.  ASSIST: none.  ANESTHESIA:  general  IV FLUIDS AND URINE: See anesthesia.  ESTIMATED BLOOD LOSS: 100 mL.  IMPLANTS: none  DRAINS: wound vac  COMPLICATIONS: None.  DESCRIPTION OF PROCEDURE: The patient was brought to the operating room and placed supine on the operating table.  The patient had been signed prior to the procedure and this was documented. The patient had the anesthesia placed by the anesthesiologist.  A time-out was performed to confirm that this was the correct patient, site, side and location.  Patient was placed in the lateral position in the peg board with all bony prominences well padded.  The patient had the operative extremity prepped and draped in the standard surgical fashion.    After removal of the wound vac, there was still frank pus, necrotic skin and muscle through the wound bed.  I then performed sharp excisional debridement of the deltoid muscle and the subcutaneous tissue and skin with rongeur and knife.  The abscess was within the muscle and deep to it.  There was undermining also.  After thorough debridement, I irrigated with 6 L of normal saline.  The wound was then dressed with a wound vac at -125 mm Hg.  Patient tolerated procedure well.  POSTOPERATIVE PLAN: Will need to be on antibiotics until speciation.  Will need to return to OR in the next 48-72 hours for repeat I&D and possible closure of wound.  Mayra ReelN. Michael Xu, MD [facility] [phone] 8:12 PM

## 2015-12-27 NOTE — Transfer of Care (Signed)
Immediate Anesthesia Transfer of Care Note  Patient: Raymond Winters  Procedure(s) Performed: Procedure(s): IRRIGATION AND DEBRIDEMENT LEFT SHOULDER (Left)  Patient Location: PACU  Anesthesia Type:General  Level of Consciousness: awake, alert  and oriented  Airway & Oxygen Therapy: Patient Spontanous Breathing and Patient connected to nasal cannula oxygen  Post-op Assessment: Report given to RN, Post -op Vital signs reviewed and stable and Patient moving all extremities X 4  Post vital signs: Reviewed and stable  Last Vitals:  Filed Vitals:   12/27/15 0447 12/27/15 0849  BP: 102/60 98/65  Pulse: 74 68  Temp: 36.7 C 36.6 C  Resp: 18 18    Last Pain:  Filed Vitals:   12/27/15 0850  PainSc: 7       Patients Stated Pain Goal: 4 (12/26/15 1849)  Complications: No apparent anesthesia complications

## 2015-12-27 NOTE — Clinical Social Work Note (Signed)
CSW received consult for patient needing substance abuse information, CSW to assess at a later time.  Ervin KnackEric R. Braylan Faul, MSW, Theresia MajorsLCSWA [phone] 12/27/2015 7:01 PM

## 2015-12-27 NOTE — Progress Notes (Signed)
Family Medicine Teaching Service Daily Progress Note Intern Pager: [phone]  Patient name: Raymond Winters [place] Medical record number: [id number] Date of birth: [birth year] Age: 55 y.o. Gender: male  Primary Care Provider: No PCP Per Patient Consultants: Orthopedics Code Status: Full  Pt Overview and Major Events to Date:  7/4 admitted for L shoulder pain  Assessment and Plan: Raymond Winters Lagan is a 55 y.o. male presenting with left deltoid soft tissue abscess. PMH is significant for heroin abuse, T2DM, HLD, HTN.   Left Deltoid Abscess. 17x7cm abscess in left shoulder found on CT. Secondary to injection on heroin into left deltoid. No evidence of osteomyelitis on CT. Mild leukocytosis (WBC 13.0). Vitals stable with no other signs of systemic infection. On vanc/zosyn (started 7/4). Had c/o L lower arm swelling last night (7/6), doppler neg for DVT - Per Ortho - return to OR for repeat I&D on today (7/7). - Continue vanc, DC zosyn - f/u blood cultures pending - wound cultures pending: abundant gram pos cocci, identification to follow - up with PT/OT - pain control on norco   Heroin Abuse. Daily user for several years. HIV negative in 2015. HCV positive (see below problem). Also with "occasional" alcohol use.  - CIWA - HIV nonreactuive - Social work consult.   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 No documentation of treatment for this. LFTs (AST34 ALT19 tbili 0.8) and INR(1.42) wnl - Will need follow up with ID (has not seen them since 2014) - monitor CMP/INR - UDS pos for opiates and cocaine  Hyponatremia. Na 127 on admission. Possibly secondary to dehydration/malnutrition (appears dry on exam). Now 133 - NS @ 100cc/hr overnight - Trend BMP  T2DM. On lantus 25U nightly at home. A1c 12.7 in January. A1c 11.1 (12/26/15) This am (7/7) hypoglycemia CBG 44, 1amp D50 given; likely d/t improving infection. - Decrease to lantus 12U - SSI - monitor CBG  HLD. - Holding home  pravastatin, consider restart given LFTs wnl  FEN/GI: carb modified diet afterwards, NS @100cc /hr Prophylaxis: Lovenox. Scds. ambulation  Disposition: Admitted to med surg pending above management.   Subjective:  Patient states feels well and has good pain control States L lower arm is swollen and midly painful. States eating well and moving bowels well. Has no questions or concerns today  Objective: Temp:  [97.6 F (36.4 C)-98.2 F (36.8 C)] 98 F (36.7 C) (07/07 0447) Pulse Rate:  [72-82] 74 (07/07 0447) Resp:  [16-18] 18 (07/07 0447) BP: (89-103)/(58-74) 102/60 mmHg (07/07 0447) SpO2:  [99 %-100 %] 99 % (07/07 0447)   Physical Exam:  General: Thin, chronically ill appearing male in NAD lying in hospital bed Eyes: Mild scleral icterus noted, PERRL ENTM: moist mucus membranes Cardiovascular: Distant heart sounds. No murmurs appreciated Respiratory: NWOB, CTAB Abdomen: Thin. +BS, S, NT, ND MSK: L deltoid area with wound vac C/D/I draining serosanguinous fluid with L lower arm edema without erythema, 2+ radial pulses b/l Skin: Warm, dry Neuro: Alert and conversational. No focal deficits Psych: Appropriate mood and affect.  Laboratory:  Recent Labs Lab 12/24/15 1303 12/24/15 1309 12/25/15 0124  WBC 13.0*  --  16.3*  HGB 11.6* 12.6* 12.3*  HCT 34.2* 37.0* 36.5*  PLT 229  --  215    Recent Labs Lab 12/24/15 1309 12/25/15 0124  NA 127* 133*  K 4.0 3.9  CL 90* 98*  CO2  --  23  BUN 17 14  CREATININE 1.10 1.09  CALCIUM  --  7.9*  PROT  --  6.1*  BILITOT  --  0.8  ALKPHOS  --  82  ALT  --  19  AST  --  34  GLUCOSE 306* 99     Imaging/Diagnostic Tests:  Left Upper Extremity Venous Duplex Evaluation Study information:  Study status: Routine. Procedure: The left internal jugular, left subclavian, left axillary, left basilic, left cephalic, left brachial, left radial, left ulnar, right internal jugular and right subclavian veins were studied. A  vascular evaluation was performed with the patient in the supine position. The study was technically limited due to edema. Left upper extremity venous duplex study.  Doppler flow study including B-mode compression maneuvers of all visualized segments, color flow Doppler and selected views of pulsed wave Doppler. Birthdate: Patient birthdate: 1961-02-25. Age: Patient is 55 yr old. Sex: Gender: male. Study date: Study date: 12/26/2015. Study time: 06:10 PM. Location: Vascular laboratory. Patient status: Inpatient.   Raymond HerElsia J Deaja Rizo, DO 12/27/2015, 7:56 AM PGY-1, [facility] FPTS Intern pager: [phone], text pages welcome

## 2015-12-28 LAB — BASIC METABOLIC PANEL
Anion gap: 7 (ref 5–15)
BUN: 11 mg/dL (ref 6–20)
CHLORIDE: 101 mmol/L (ref 101–111)
CO2: 25 mmol/L (ref 22–32)
Calcium: 7.7 mg/dL — ABNORMAL LOW (ref 8.9–10.3)
Creatinine, Ser: 0.92 mg/dL (ref 0.61–1.24)
GFR calc Af Amer: >60 mL/min (ref >60)
GFR calc non Af Amer: >60 mL/min (ref >60)
GLUCOSE: 271 mg/dL — AB (ref 65–99)
POTASSIUM: 4.5 mmol/L (ref 3.5–5.1)
Sodium: 133 mmol/L — ABNORMAL LOW (ref 135–145)

## 2015-12-28 LAB — GLUCOSE, CAPILLARY
GLUCOSE-CAPILLARY: 55 mg/dL — AB (ref 65–99)
Glucose-Capillary: 76 mg/dL (ref 65–99)
Glucose-Capillary: 192 mg/dL — ABNORMAL HIGH (ref 65–99)
Glucose-Capillary: 203 mg/dL — ABNORMAL HIGH (ref 65–99)
Glucose-Capillary: 294 mg/dL — ABNORMAL HIGH (ref 65–99)

## 2015-12-28 LAB — CBC
HEMATOCRIT: 25.3 % — AB (ref 39.0–52.0)
Hemoglobin: 8 g/dL — ABNORMAL LOW (ref 13.0–17.0)
MCH: 25.9 pg — ABNORMAL LOW (ref 26.0–34.0)
MCHC: 31.6 g/dL (ref 30.0–36.0)
MCV: 81.9 fL (ref 78.0–100.0)
Platelets: 207 10*3/uL (ref 150–400)
RBC: 3.09 MIL/uL — ABNORMAL LOW (ref 4.22–5.81)
RDW: 13.1 % (ref 11.5–15.5)
WBC: 5 10*3/uL (ref 4.0–10.5)

## 2015-12-28 MED ORDER — INSULIN GLARGINE 100 UNIT/ML ~~LOC~~ SOLN
8.0000 [IU] | Freq: Every day | SUBCUTANEOUS | Status: DC
  Administered 2015-12-28 – 2015-12-30 (×3): 8 [IU] via SUBCUTANEOUS
  Filled 2015-12-28 (×4): qty 0.08

## 2015-12-28 MED ORDER — GLUCOSE 40 % PO GEL
ORAL | Status: AC
  Filled 2015-12-28: qty 1

## 2015-12-28 NOTE — Anesthesia Postprocedure Evaluation (Signed)
Anesthesia Post Note  Patient: Raymond Winters  Procedure(s) Performed: Procedure(s) (LRB): IRRIGATION AND DEBRIDEMENT LEFT SHOULDER (Left)  Patient location during evaluation: PACU Anesthesia Type: General Level of consciousness: awake Pain management: pain level controlled Vital Signs Assessment: post-procedure vital signs reviewed and stable Respiratory status: spontaneous breathing Cardiovascular status: stable Postop Assessment: no signs of nausea or vomiting Anesthetic complications: no     Last Vitals:  Filed Vitals:   12/28/15 0900 12/28/15 1300  BP: 101/73 96/75  Pulse: 87 91  Temp: 36.5 C 36.8 C  Resp: 18 18    Last Pain:  Filed Vitals:   12/28/15 1458  PainSc: 7    Pain Goal: Patients Stated Pain Goal: 2 (12/27/15 2358)               Seeley Southgate

## 2015-12-28 NOTE — Evaluation (Signed)
Physical Therapy Evaluation Patient Details Name: Raymond Winters MRN: [id number] DOB: 05/01/1961 Today's Date: 12/28/2015   History of Present Illness  Admitted with left shoulder abscess and underwent I/D on 7/4 and again on 7/7 and is now using vacuum assisted pressure to wound. PMH significant for daily heroin use, DM, and HTN.   Clinical Impression  Pt admitted with above diagnosis. Pt currently with functional limitations due to the deficits listed below (see PT Problem List).  Pt will benefit from skilled PT to increase their independence and safety with mobility to allow discharge to home. Pt with some mild balance issues which he reports are worse than his baseline.  Recommend pt continue to ambulate with nursing staff daily in hallways as his balance issues may be from being mainly in the bed the last several days.    Follow Up Recommendations No PT follow up;Supervision - Intermittent    Equipment Recommendations  None recommended by PT    Recommendations for Other Services       Precautions / Restrictions Precautions Precautions: Fall Restrictions Weight Bearing Restrictions: Yes LUE Weight Bearing: Non weight bearing      Mobility  Bed Mobility Overal bed mobility: Independent                Transfers Overall transfer level: Needs assistance Equipment used: None Transfers: Sit to/from Stand Sit to Stand: Supervision            Ambulation/Gait Ambulation/Gait assistance: Min guard Ambulation Distance (Feet): 500 Feet Assistive device: None Gait Pattern/deviations: Step-through pattern;Scissoring     General Gait Details: pt would occassionally stagger or scissor with legs, but did not require PT to correct. Pt reports he does some of this at baseline but that his gait is worse now than at baseline  Stairs            Wheelchair Mobility    Modified Rankin (Stroke Patients Only)       Balance Overall balance assessment: Needs assistance  (Noted pt fell in hospital and today had no recollection ) Sitting-balance support: No upper extremity supported Sitting balance-Leahy Scale: Normal     Standing balance support: No upper extremity supported Standing balance-Leahy Scale: Good                   Standardized Balance Assessment Standardized Balance Assessment :  (recommend standardized balance assessment next session.)           Pertinent Vitals/Pain Pain Assessment: 0-10 Pain Score: 7  Pain Location: left shoulder Pain Intervention(s): Limited activity within patient's tolerance;Monitored during session    Home Living Family/patient expects to be discharged to:: Private residence Living Arrangements: Non-relatives/Friends Available Help at Discharge: Family;Available 24 hours/day (Lives with brother who is retired) Type of Home: Apartment Home Access: Level entry     Home Layout: One level Home Equipment: Cane - single point      Prior Function Level of Independence: Independent with assistive device(s)         Comments: Pt reports he was fully independent with cane. Reports no falls at home     Hand Dominance        Extremity/Trunk Assessment   Upper Extremity Assessment: LUE deficits/detail (Did not assess due to pain and NWB on LUE)           Lower Extremity Assessment: Overall WFL for tasks assessed      Cervical / Trunk Assessment: Normal  Communication   Communication: No difficulties  Cognition Arousal/Alertness:  Awake/alert Behavior During Therapy: WFL for tasks assessed/performed Overall Cognitive Status: Within Functional Limits for tasks assessed                      General Comments General comments (skin integrity, edema, etc.): No family present. Pt very cooperative and joking during session.     Exercises        Assessment/Plan    PT Assessment Patient needs continued PT services  PT Diagnosis Abnormality of gait   PT Problem List Decreased  balance;Pain  PT Treatment Interventions Gait training;Balance training;Therapeutic activities;Patient/family education   PT Goals (Current goals can be found in the Care Plan section) Acute Rehab PT Goals Patient Stated Goal: to get some things straightened out while he is here PT Goal Formulation: With patient Time For Goal Achievement: 01/04/16 Potential to Achieve Goals: Good    Frequency Min 2X/week   Barriers to discharge        Co-evaluation               End of Session Equipment Utilized During Treatment: Gait belt Activity Tolerance: Patient tolerated treatment well Patient left: in bed;with call bell/phone within reach;with bed alarm set Nurse Communication: Mobility status;Other (comment) (Walk with nursing several times a day)         Time: 2440-1027 PT Time Calculation (min) (ACUTE ONLY): 23 min   Charges:   PT Evaluation $PT Eval Low Complexity: 1 Procedure PT Treatments $Gait Training: 8-22 mins   PT G Codes:        Donnella Sham 12/28/2015, 11:52 AM  Lavona Mound, PT  [phone] 12/28/2015

## 2015-12-28 NOTE — Progress Notes (Signed)
Family Medicine Teaching Service Daily Progress Note Intern Pager: [phone]  Patient name: Raymond Winters Medical record number: [id number] Date of birth: [birth year] Age: 55 y.o. Gender: male  Primary Care Provider: No PCP Per Patient Consultants: Orthopedics Code Status: Full  Pt Overview and Major Events to Date:  7/4 admitted for L shoulder pain, surgical debridement of muscle w/ wound vac placement 7/7 repeat surgical debridement of muscle, wound vac replaced  Assessment and Plan: Raymond ButteryGary Winters is a 55 y.o. male presenting with left deltoid soft tissue abscess. PMH is significant for heroin abuse, T2DM, HLD, HTN.   # Left Deltoid Abscess. 17x7cm abscess in left shoulder found on CT. Secondary to injection on heroin into left deltoid. No evidence of osteomyelitis on CT. Vitals stable with no other signs of systemic infection.  Doppler neg for DVT - Per Ortho - return to OR for repeat I&D in next few days. - Continue vanc (7/4>) , Zosyn (7/4>7/7) - f/u blood cultures NG x4 days - wound cultures pending: abundant gram pos cocci, identification to follow - up with PT/OT - pain control on norco (4 doses in last 24 hours).  Had 4 does of Dilaudid in 24 hours - CBC and BMP pending draw this am  # Heroin Abuse. Daily user for several years. HIV negative in 2015. HCV positive (see below problem). Also with "occasional" alcohol use.  CIWA 2 ,10. - CIWA - HIV nonreactuive - Social work consult.   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 No documentation of treatment for this. LFTs (AST34 ALT19 tbili 0.8) and INR(1.42) wnl - Will need follow up with ID (has not seen them since 2014) - monitor CMP/INR - UDS pos for opiates and cocaine  # Hyponatremia. Na 127 on admission. Possibly secondary to dehydration/malnutrition (appears dry on exam).  - SLIV - Trend BMP  # T2DM. On lantus 25U nightly at home. A1c 12.7 in January. A1c 11.1 (12/26/15) This am (7/8) hypoglycemia CBG 55,  improved with juice. Likely d/t improving infection. - Decrease to lantus 8U - sSSI - monitor CBG - Need to discuss Metformin in lieu of Lantus at discharge.  # HLD. - Holding home pravastatin, consider restart given LFTs wnl  FEN/GI: Heart health diet, SLIV Prophylaxis: Lovenox. Scds. ambulation  Disposition: Admitted to med surg pending above management.   Subjective:  Patient reports that his left shoulder is feeling better today.  He acknowledges that he will likely be going for another I&D in a couple of days.  No concerns this am.  Objective: Temp:  [97.3 F (36.3 C)-98.5 F (36.9 C)] 98 F (36.7 C) (07/08 0538) Pulse Rate:  [66-113] 81 (07/08 0538) Resp:  [9-18] 17 (07/08 0538) BP: (98-123)/(68-89) 123/88 mmHg (07/08 0538) SpO2:  [97 %-100 %] 100 % (07/08 0538)   Physical Exam:  General: Thin, chronically ill appearing male in NAD lying in hospital bed Eyes: scleral icterus noted, EOMI ENTM: poor dentition, moist mucus membranes Cardiovascular: Distant heart sounds. No murmurs appreciated Respiratory: NWOB on room air, CTAB Abdomen: flat. +BS, S, NT, ND MSK: L deltoid area with wound vac C/D/I draining serosanguinous fluid with L lower arm edema without erythema, moderate induration at lesion site, 2+ radial pulses b/l Skin: Warm, dry Neuro: responds to commands and questioning appropriately Psych: stable mood and affect flat.  Laboratory:  Recent Labs Lab 12/24/15 1303 12/24/15 1309 12/25/15 0124  WBC 13.0*  --  16.3*  HGB 11.6* 12.6* 12.3*  HCT 34.2*  37.0* 36.5*  PLT 229  --  215    Recent Labs Lab 12/24/15 1309 12/25/15 0124  NA 127* 133*  K 4.0 3.9  CL 90* 98*  CO2  --  23  BUN 17 14  CREATININE 1.10 1.09  CALCIUM  --  7.9*  PROT  --  6.1*  BILITOT  --  0.8  ALKPHOS  --  82  ALT  --  19  AST  --  34  GLUCOSE 306* 99     Imaging/Diagnostic Tests:  Left Upper Extremity Venous Duplex Evaluation Study information:  Study status:  Routine. Procedure: The left internal jugular, left subclavian, left axillary, left basilic, left cephalic, left brachial, left radial, left ulnar, right internal jugular and right subclavian veins were studied. A vascular evaluation was performed with the patient in the supine position. The study was technically limited due to edema. Left upper extremity venous duplex study.  Doppler flow study including B-mode compression maneuvers of all visualized segments, color flow Doppler and selected views of pulsed wave Doppler. Birthdate: Patient birthdate: 1960/11/07. Age: Patient is 55 yr old. Sex: Gender: male. Study date: Study date: 12/26/2015. Study time: 06:10 PM. Location: Vascular laboratory. Patient status: Inpatient.   Raliegh Ip, DO 12/28/2015, 9:20 AM PGY-3, [facility] FPTS Intern pager: [phone], text pages welcome

## 2015-12-28 NOTE — Progress Notes (Signed)
   12/28/15 0043  What Happened  Was fall witnessed? No  Patients activity before fall ambulating-unassisted  Point of contact buttocks  Was patient injured? No  Patient found on floor  Found by Staff-comment (pt. found in doorway. stated he slipped and fell)  Stated prior activity ambulating-unassisted  Follow Up  MD notified paged teaching service  Time MD notified [phone]  Additional tests No  Simple treatment Other (comment) (no treatments neccessary)  Progress note created (see row info) Yes

## 2015-12-29 DIAGNOSIS — M00212 Other streptococcal arthritis, left shoulder: Secondary | ICD-10-CM

## 2015-12-29 LAB — AEROBIC/ANAEROBIC CULTURE (SURGICAL/DEEP WOUND)

## 2015-12-29 LAB — CBC
HEMATOCRIT: 24.1 % — AB (ref 39.0–52.0)
Hemoglobin: 7.9 g/dL — ABNORMAL LOW (ref 13.0–17.0)
MCH: 26.6 pg (ref 26.0–34.0)
MCHC: 32.8 g/dL (ref 30.0–36.0)
MCV: 81.1 fL (ref 78.0–100.0)
PLATELETS: 221 10*3/uL (ref 150–400)
RBC: 2.97 MIL/uL — ABNORMAL LOW (ref 4.22–5.81)
RDW: 13.2 % (ref 11.5–15.5)
WBC: 5.1 10*3/uL (ref 4.0–10.5)

## 2015-12-29 LAB — BASIC METABOLIC PANEL
Anion gap: 3 — ABNORMAL LOW (ref 5–15)
BUN: 11 mg/dL (ref 6–20)
CHLORIDE: 103 mmol/L (ref 101–111)
CO2: 28 mmol/L (ref 22–32)
CREATININE: 0.88 mg/dL (ref 0.61–1.24)
Calcium: 7.7 mg/dL — ABNORMAL LOW (ref 8.9–10.3)
GFR calc Af Amer: >60 mL/min (ref >60)
GFR calc non Af Amer: >60 mL/min (ref >60)
GLUCOSE: 291 mg/dL — AB (ref 65–99)
POTASSIUM: 4.3 mmol/L (ref 3.5–5.1)
Sodium: 134 mmol/L — ABNORMAL LOW (ref 135–145)

## 2015-12-29 LAB — CULTURE, BLOOD (ROUTINE X 2)
CULTURE: NO GROWTH
Culture: NO GROWTH

## 2015-12-29 LAB — AEROBIC/ANAEROBIC CULTURE W GRAM STAIN (SURGICAL/DEEP WOUND)

## 2015-12-29 LAB — GLUCOSE, CAPILLARY
GLUCOSE-CAPILLARY: 215 mg/dL — AB (ref 65–99)
Glucose-Capillary: 230 mg/dL — ABNORMAL HIGH (ref 65–99)
Glucose-Capillary: 277 mg/dL — ABNORMAL HIGH (ref 65–99)
Glucose-Capillary: 222 mg/dL — ABNORMAL HIGH (ref 65–99)

## 2015-12-29 MED ORDER — PIPERACILLIN-TAZOBACTAM 3.375 G IVPB
3.3750 g | Freq: Three times a day (TID) | INTRAVENOUS | Status: DC
  Administered 2015-12-29 – 2015-12-31 (×7): 3.375 g via INTRAVENOUS
  Filled 2015-12-29 (×8): qty 50

## 2015-12-29 MED ORDER — IBUPROFEN 400 MG PO TABS
400.0000 mg | ORAL_TABLET | ORAL | Status: DC | PRN
  Administered 2015-12-31: 400 mg via ORAL
  Filled 2015-12-29: qty 1

## 2015-12-29 NOTE — H&P (Signed)
H&P update  The surgical history has been reviewed and remains accurate without interval change.  The patient was re-examined and patient's physiologic condition has not changed significantly in the last 30 days. The condition still exists that makes this procedure necessary. The treatment plan remains the same, without new options for care.  No new pharmacological allergies or types of therapy has been initiated that would change the plan or the appropriateness of the plan.  The patient and/or family understand the potential benefits and risks.  Mayra ReelN. Michael Xu, MD 12/29/2015 8:08 PM

## 2015-12-29 NOTE — Progress Notes (Signed)
   12/29/15 2100  Clinical Encounter Type  Visited With Patient  Visit Type Follow-up  Referral From Patient  Consult/Referral To Chaplain  Spiritual Encounters  Spiritual Needs Prayer  Stress Factors  Patient Stress Factors Health changes;Lack of knowledge  Chaplain follow up made, patient appears mildly anxious verbalizes possibility of additional surgery needed, provided therapeutic listening, spiritual presence, emotional support and prayer.  Advised that continued support services are available as needed.

## 2015-12-29 NOTE — Progress Notes (Signed)
At 6 am a total amount of 250 mL was recorded for wound vac output. This is the total amount since Friday 12/27/15. Canister has been marked and drainage documented. Will continue to monitor.

## 2015-12-29 NOTE — Progress Notes (Addendum)
Pharmacy Antibiotic Note  Raymond ButteryGary Winters is a 55 y.o. male admitted on 12/24/2015 with L- shoulder abscess with viridans strep and possible anaerobe.  Pharmacy has been consulted for change to Zosyn dosing.  Plan: After [facility](Family Medicine Team), antibiotics will be changed Zosyn 3.375g IV every 8 hours - 4 hour infusion.  Vancomycin discontinued. Follow-up final culture results for ability to narrow further.  Follow-up renal function, clinical status, and length of therapy.   Height: 5\' 7"  (170.2 cm) Weight: 109 lb 11.2 oz (49.76 kg) IBW/kg (Calculated) : 66.1  Temp (24hrs), Avg:98.5 F (36.9 C), Min:98.2 F (36.8 C), Max:98.6 F (37 C)   Recent Labs Lab 12/24/15 1303 12/24/15 1309 12/24/15 1632 12/25/15 0124 12/26/15 1929 12/28/15 1926 12/29/15 0456  WBC 13.0*  --   --  16.3*  --  5.0 5.1  CREATININE  --  1.10  --  1.09  --  0.92 0.88  LATICACIDVEN  --  2.51* 2.67*  --   --   --   --   VANCOTROUGH  --   --   --   --  12*  --   --     Estimated Creatinine Clearance: 66 mL/min (by C-G formula based on Cr of 0.88).    Allergies  Allergen Reactions  . Ativan [Lorazepam] Other (See Comments)    Causes confusion and hallutations    Antimicrobials this admission: Vanc 7/5 >>7/9 Zosyn 7/5 >>7/7; 7/9 >>  Dose adjustments this admission: n/a  Microbiology results: 7/4 Abscess L-shoulder >> moderate viridans streptococcus; lab holding for possible anaerobe 7/4 Blood >> ngx4d 7/7 MRSA pcr negative  Thank you for allowing pharmacy to be a part of this patient's care.  Link SnufferJessica Damarkus Balis, PharmD, BCPS Clinical Pharmacist [phone] 12/29/2015 9:27 AM

## 2015-12-29 NOTE — Progress Notes (Signed)
Family Medicine Teaching Service Daily Progress Note Intern Pager: [phone]  Patient name: Raymond ButteryGary Winters Medical record number: [id number] Date of birth: May 25, 1961 Age: 55 y.o. Gender: male  Primary Care Provider: No PCP Per Patient Consultants: Orthopedics Code Status: Full  Pt Overview and Major Events to Date:  7/4 admitted for L shoulder pain, surgical debridement of muscle w/ wound vac placement 7/7 repeat surgical debridement of muscle, wound vac replaced  Assessment and Plan: Raymond Winters is a 55 y.o. male presenting with left deltoid soft tissue abscess. PMH is significant for heroin abuse, T2DM, HLD, HTN.   # Left Deltoid Abscess. 17x7cm abscess in left shoulder found on CT. Secondary to injection on heroin into left deltoid. No evidence of osteomyelitis on CT. Vitals stable with no other signs of systemic infection. Doppler neg for DVT. WBC wnl. Patient c/o continued L arm pain but states is unchanged from a few days ago - Per Ortho - return to OR for repeat I&D in next few days likely 7/10 or 7/11 - DC vanc (7/4>7/9), Zosyn restart (received 7/4>7/7, restart today) - f/u blood cultures NG x4 days - wound cultures pending: moderate strep viridans, holding for possible anaerobes - up with PT/OT - pain control on norco (4 doses in last 24 hours)  Has not had Dilaudid in 24 hours  # Heroin Abuse. Daily user for several years. HIV negative in 2015. HCV positive (see below problem). Also with "occasional" alcohol use.  CIWA 2 ,10. - CIWA - HIV nonreactuive - Social work consult.   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 No documentation of treatment for this. LFTs (AST34 ALT19 tbili 0.8) and INR(1.42) wnl - Will need follow up with ID (has not seen them since 2014) - monitor CMP/INR - UDS pos for opiates and cocaine  # Hyponatremia, improving. Na 127 on admission, now 134. Possibly secondary to dehydration/malnutrition (appeared dry on admit exam).  - SLIV -  Trend BMP  # T2DM. On metformin and lantus 25U nightly at home. A1c 12.7 in January. A1c 11.1 (12/26/15) This am (7/8) hypoglycemia CBG 55, improved with juice. Likely d/t improving infection.  - Decrease to lantus 8U - sSSI - monitor CBG - Need to discuss Metformin in lieu of Lantus at discharge.  # HLD. - Holding home pravastatin, consider restart given LFTs wnl  FEN/GI: Heart health diet, SLIV Prophylaxis: Lovenox. Scds. ambulation  Disposition: Admitted to med surg pending above management.   Subjective:  Patient reports that his left shoulder pain continues to bother him, when asked how it compares to a few days ago states is "about the same" is asking for more pain control. He acknowledges that he will likely be going for another I&D in a couple of days. C/o R arm pain and swelling, thinks is from the IV. Has no other complaints.  Objective: Temp:  [97.7 F (36.5 C)-98.6 F (37 C)] 98.4 F (36.9 C) (07/09 0456) Pulse Rate:  [77-91] 81 (07/09 0456) Resp:  [17-18] 18 (07/09 0456) BP: (96-120)/(62-75) 102/67 mmHg (07/09 0456) SpO2:  [100 %] 100 % (07/09 0456)   Physical Exam:   General: Thin, chronically ill appearing male in NAD lying in hospital bed Eyes: scleral icterus noted, EOMI ENTM: poor dentition, moist mucus membranes Cardiovascular: Distant heart sounds. No murmurs appreciated Respiratory: NWOB on room air, CTAB Abdomen: flat. +BS, S, NT, ND MSK: L deltoid area with wound vac C/D/I draining serosanguinous fluid with L lower arm edema without erythema,  moderate induration at lesion site, 2+ radial pulses b/l. R arm edema without erythema, IV site not infiltrated. Skin: Warm, dry Neuro: responds to commands and questioning appropriately Psych: stable mood and affect flat.  Laboratory:  Recent Labs Lab 12/25/15 0124 12/28/15 1926 12/29/15 0456  WBC 16.3* 5.0 5.1  HGB 12.3* 8.0* 7.9*  HCT 36.5* 25.3* 24.1*  PLT 215 207 221    Recent Labs Lab  12/25/15 0124 12/28/15 1926 12/29/15 0456  NA 133* 133* 134*  K 3.9 4.5 4.3  CL 98* 101 103  CO2 BUN CREATININE 1.09 0.92 0.88  CALCIUM 7.9* 7.7* 7.7*  PROT 6.1*  --   --   BILITOT 0.8  --   --   ALKPHOS 82  --   --   ALT 19  --   --   AST 34  --   --   GLUCOSE 99 271* 291*     Imaging/Diagnostic Tests:  Left Upper Extremity Venous Duplex Evaluation Study information:  Study status: Routine. Procedure: The left internal jugular, left subclavian, left axillary, left basilic, left cephalic, left brachial, left radial, left ulnar, right internal jugular and right subclavian veins were studied. A vascular evaluation was performed with the patient in the supine position. The study was technically limited due to edema. Left upper extremity venous duplex study.  Doppler flow study including B-mode compression maneuvers of all visualized segments, color flow Doppler and selected views of pulsed wave Doppler. Birthdate: Patient birthdate: Mar 19, 1961. Age: Patient is 55 yr old. Sex: Gender: male. Study date: Study date: 12/26/2015. Study time: 06:10 PM. Location: Vascular laboratory. Patient status: Inpatient.   Leland Her, DO 12/29/2015, 7:10 AM PGY-1, [facility] FPTS Intern pager: [phone], text pages welcome

## 2015-12-30 ENCOUNTER — Encounter (HOSPITAL_COMMUNITY): Payer: Self-pay | Admitting: Orthopaedic Surgery

## 2015-12-30 ENCOUNTER — Encounter (HOSPITAL_COMMUNITY)
Admission: EM | Disposition: A | Discharge status: Home / Self Care | Payer: Self-pay | Source: Home / Self Care | Attending: Family Medicine

## 2015-12-30 ENCOUNTER — Inpatient Hospital Stay (HOSPITAL_COMMUNITY): Payer: MEDICAID | Admitting: Certified Registered Nurse Anesthetist

## 2015-12-30 HISTORY — PX: IRRIGATION AND DEBRIDEMENT SHOULDER: SHX5880

## 2015-12-30 LAB — C-REACTIVE PROTEIN: CRP: 0.8 mg/dL (ref <1.0)

## 2015-12-30 LAB — CBC
HCT: 23.9 % — ABNORMAL LOW (ref 39.0–52.0)
Hemoglobin: 7.7 g/dL — ABNORMAL LOW (ref 13.0–17.0)
MCH: 26.2 pg (ref 26.0–34.0)
MCHC: 32.2 g/dL (ref 30.0–36.0)
MCV: 81.3 fL (ref 78.0–100.0)
PLATELETS: 222 10*3/uL (ref 150–400)
RBC: 2.94 MIL/uL — ABNORMAL LOW (ref 4.22–5.81)
RDW: 13.2 % (ref 11.5–15.5)
WBC: 4.2 10*3/uL (ref 4.0–10.5)

## 2015-12-30 LAB — SEDIMENTATION RATE: SED RATE: 60 mm/h — AB (ref 0–16)

## 2015-12-30 LAB — BASIC METABOLIC PANEL
ANION GAP: 7 (ref 5–15)
BUN: 11 mg/dL (ref 6–20)
CALCIUM: 7.9 mg/dL — AB (ref 8.9–10.3)
CHLORIDE: 106 mmol/L (ref 101–111)
CO2: 25 mmol/L (ref 22–32)
Creatinine, Ser: 0.97 mg/dL (ref 0.61–1.24)
GFR calc non Af Amer: >60 mL/min (ref >60)
Glucose, Bld: 59 mg/dL — ABNORMAL LOW (ref 65–99)
Potassium: 4.6 mmol/L (ref 3.5–5.1)
SODIUM: 138 mmol/L (ref 135–145)

## 2015-12-30 LAB — GLUCOSE, CAPILLARY
GLUCOSE-CAPILLARY: 48 mg/dL — AB (ref 65–99)
Glucose-Capillary: 86 mg/dL (ref 65–99)
Glucose-Capillary: 55 mg/dL — ABNORMAL LOW (ref 65–99)
Glucose-Capillary: 154 mg/dL — ABNORMAL HIGH (ref 65–99)
Glucose-Capillary: 126 mg/dL — ABNORMAL HIGH (ref 65–99)
Glucose-Capillary: 99 mg/dL (ref 65–99)
Glucose-Capillary: 256 mg/dL — ABNORMAL HIGH (ref 65–99)

## 2015-12-30 SURGERY — IRRIGATION AND DEBRIDEMENT SHOULDER
Anesthesia: General | Site: Shoulder | Laterality: Left

## 2015-12-30 MED ORDER — LACTATED RINGERS IV SOLN: INTRAVENOUS | Status: DC

## 2015-12-30 MED ORDER — ACETAMINOPHEN 325 MG PO TABS: 325.0000 mg | ORAL_TABLET | ORAL | Status: DC | PRN

## 2015-12-30 MED ORDER — PHENYLEPHRINE HCL 10 MG/ML IJ SOLN
INTRAMUSCULAR | Status: DC | PRN
  Administered 2015-12-30 (×2): 80 ug via INTRAVENOUS

## 2015-12-30 MED ORDER — HYDROMORPHONE HCL 1 MG/ML IJ SOLN
INTRAMUSCULAR | Status: AC
  Administered 2015-12-30: 0.5 mg via INTRAVENOUS
  Filled 2015-12-30: qty 1

## 2015-12-30 MED ORDER — LACTATED RINGERS IV SOLN
INTRAVENOUS | Status: DC | PRN
  Administered 2015-12-30: 17:00:00 via INTRAVENOUS

## 2015-12-30 MED ORDER — PROPOFOL 10 MG/ML IV BOLUS
INTRAVENOUS | Status: DC | PRN
  Administered 2015-12-30: 20 mg via INTRAVENOUS
  Administered 2015-12-30: 130 mg via INTRAVENOUS

## 2015-12-30 MED ORDER — ONDANSETRON HCL 4 MG/2ML IJ SOLN
INTRAMUSCULAR | Status: AC
  Filled 2015-12-30: qty 2

## 2015-12-30 MED ORDER — HYDROMORPHONE HCL 1 MG/ML IJ SOLN
0.2500 mg | INTRAMUSCULAR | Status: DC | PRN
  Administered 2015-12-30 (×2): 0.5 mg via INTRAVENOUS

## 2015-12-30 MED ORDER — LIDOCAINE HCL (CARDIAC) 20 MG/ML IV SOLN
INTRAVENOUS | Status: DC | PRN
  Administered 2015-12-30: 50 mg via INTRAVENOUS

## 2015-12-30 MED ORDER — FENTANYL CITRATE (PF) 250 MCG/5ML IJ SOLN
INTRAMUSCULAR | Status: AC
  Filled 2015-12-30: qty 5

## 2015-12-30 MED ORDER — PROPOFOL 10 MG/ML IV BOLUS
INTRAVENOUS | Status: AC
  Filled 2015-12-30: qty 20

## 2015-12-30 MED ORDER — ONDANSETRON HCL 4 MG/2ML IJ SOLN
INTRAMUSCULAR | Status: DC | PRN
  Administered 2015-12-30: 4 mg via INTRAVENOUS

## 2015-12-30 MED ORDER — DEXTROSE-NACL 5-0.45 % IV SOLN
INTRAVENOUS | Status: DC
  Administered 2015-12-30 – 2015-12-31 (×3): via INTRAVENOUS

## 2015-12-30 MED ORDER — SODIUM CHLORIDE 0.9 % IR SOLN
Status: DC | PRN
  Administered 2015-12-30: 3000 mL

## 2015-12-30 MED ORDER — ACETAMINOPHEN 160 MG/5ML PO SOLN
325.0000 mg | ORAL | Status: DC | PRN
  Filled 2015-12-30: qty 20.3

## 2015-12-30 MED ORDER — SODIUM CHLORIDE 0.9 % IR SOLN
Status: DC | PRN
  Administered 2015-12-30: 1000 mL

## 2015-12-30 MED ORDER — CEFAZOLIN SODIUM-DEXTROSE 2-4 GM/100ML-% IV SOLN
2.0000 g | INTRAVENOUS | Status: DC
  Filled 2015-12-30: qty 100

## 2015-12-30 MED ORDER — PHENYLEPHRINE 40 MCG/ML (10ML) SYRINGE FOR IV PUSH (FOR BLOOD PRESSURE SUPPORT)
PREFILLED_SYRINGE | INTRAVENOUS | Status: AC
  Filled 2015-12-30: qty 10

## 2015-12-30 MED ORDER — DEXTROSE 50 % IV SOLN
INTRAVENOUS | Status: AC
  Administered 2015-12-30: 50 mL
  Filled 2015-12-30: qty 50

## 2015-12-30 MED ORDER — EPHEDRINE SULFATE 50 MG/ML IJ SOLN
INTRAMUSCULAR | Status: DC | PRN
  Administered 2015-12-30 (×3): 5 mg via INTRAVENOUS

## 2015-12-30 MED ORDER — MIDAZOLAM HCL 2 MG/2ML IJ SOLN
INTRAMUSCULAR | Status: AC
  Filled 2015-12-30: qty 2

## 2015-12-30 MED ORDER — ROCURONIUM BROMIDE 50 MG/5ML IV SOLN
INTRAVENOUS | Status: AC
  Filled 2015-12-30: qty 1

## 2015-12-30 MED ORDER — SUCCINYLCHOLINE CHLORIDE 20 MG/ML IJ SOLN
INTRAMUSCULAR | Status: DC | PRN
  Administered 2015-12-30: 60 mg via INTRAVENOUS

## 2015-12-30 MED ORDER — OXYCODONE HCL 5 MG PO TABS: 5.0000 mg | ORAL_TABLET | Freq: Once | ORAL | Status: DC | PRN

## 2015-12-30 MED ORDER — DEXTROSE 50 % IV SOLN
1.0000 | Freq: Once | INTRAVENOUS | Status: AC
  Administered 2015-12-30: 50 mL via INTRAVENOUS
  Filled 2015-12-30: qty 50

## 2015-12-30 MED ORDER — OXYCODONE HCL 5 MG/5ML PO SOLN: 5.0000 mg | Freq: Once | ORAL | Status: DC | PRN

## 2015-12-30 MED ORDER — FENTANYL CITRATE (PF) 100 MCG/2ML IJ SOLN
INTRAMUSCULAR | Status: DC | PRN
  Administered 2015-12-30: 100 ug via INTRAVENOUS
  Administered 2015-12-30 (×3): 50 ug via INTRAVENOUS

## 2015-12-30 SURGICAL SUPPLY — 49 items
CANISTER WOUND CARE 500ML ATS (WOUND CARE) ×2 IMPLANT
COVER SURGICAL LIGHT HANDLE (MISCELLANEOUS) ×3 IMPLANT
DRAPE U-SHAPE 47X51 STRL (DRAPES) ×3 IMPLANT
DRSG MEPITEL 4X7.2 (GAUZE/BANDAGES/DRESSINGS) ×2 IMPLANT
DRSG PAD ABDOMINAL 8X10 ST (GAUZE/BANDAGES/DRESSINGS) ×6 IMPLANT
DRSG VAC ATS SM SENSATRAC (GAUZE/BANDAGES/DRESSINGS) ×3 IMPLANT
DURAPREP 26ML APPLICATOR (WOUND CARE) ×1 IMPLANT
ELECT REM PT RETURN 9FT ADLT (ELECTROSURGICAL)
ELECTRODE REM PT RTRN 9FT ADLT (ELECTROSURGICAL) IMPLANT
EVACUATOR 1/8 PVC DRAIN (DRAIN) ×1 IMPLANT
GAUZE SPONGE 4X4 12PLY STRL (GAUZE/BANDAGES/DRESSINGS) ×3 IMPLANT
GLOVE BIOGEL PI ORTHO PRO 7.5 (GLOVE)
GLOVE BIOGEL PI ORTHO PRO SZ8 (GLOVE)
GLOVE ORTHO TXT STRL SZ7.5 (GLOVE) ×1 IMPLANT
GLOVE PI ORTHO PRO STRL 7.5 (GLOVE) ×1 IMPLANT
GLOVE PI ORTHO PRO STRL SZ8 (GLOVE) IMPLANT
GLOVE SURG ORTHO 8.5 STRL (GLOVE) IMPLANT
GOWN STRL REUS W/ TWL LRG LVL3 (GOWN DISPOSABLE) ×1 IMPLANT
GOWN STRL REUS W/ TWL XL LVL3 (GOWN DISPOSABLE) ×4 IMPLANT
GOWN STRL REUS W/TWL LRG LVL3 (GOWN DISPOSABLE) ×3
GOWN STRL REUS W/TWL XL LVL3 (GOWN DISPOSABLE)
HANDPIECE INTERPULSE COAX TIP (DISPOSABLE)
KIT BASIN OR (CUSTOM PROCEDURE TRAY) ×3 IMPLANT
KIT ROOM TURNOVER OR (KITS) ×3 IMPLANT
MANIFOLD NEPTUNE II (INSTRUMENTS) ×3 IMPLANT
NS IRRIG 1000ML POUR BTL (IV SOLUTION) ×3 IMPLANT
PACK SHOULDER (CUSTOM PROCEDURE TRAY) ×3 IMPLANT
PAD ARMBOARD 7.5X6 YLW CONV (MISCELLANEOUS) ×6 IMPLANT
SET CYSTO W/LG BORE CLAMP LF (SET/KITS/TRAYS/PACK) ×2 IMPLANT
SET HNDPC FAN SPRY TIP SCT (DISPOSABLE) IMPLANT
SPONGE LAP 18X18 X RAY DECT (DISPOSABLE) IMPLANT
SUT ETHILON 3 0 PS 1 (SUTURE) ×6 IMPLANT
SUT FIBERWIRE #2 38 T-5 BLUE (SUTURE)
SUT MNCRL AB 3-0 PS2 18 (SUTURE) IMPLANT
SUT MON AB 2-0 CT1 36 (SUTURE) ×2 IMPLANT
SUT VIC AB 0 CT1 27 (SUTURE)
SUT VIC AB 0 CT1 27XBRD ANBCTR (SUTURE) ×1 IMPLANT
SUT VIC AB 2-0 CT1 27 (SUTURE)
SUT VIC AB 2-0 CT1 TAPERPNT 27 (SUTURE) ×1 IMPLANT
SUTURE FIBERWR #2 38 T-5 BLUE (SUTURE) IMPLANT
SWAB COLLECTION DEVICE MRSA (MISCELLANEOUS) ×3 IMPLANT
TOWEL OR 17X24 6PK STRL BLUE (TOWEL DISPOSABLE) ×3 IMPLANT
TOWEL OR 17X26 10 PK STRL BLUE (TOWEL DISPOSABLE) ×3 IMPLANT
TUBE ANAEROBIC SPECIMEN COL (MISCELLANEOUS) IMPLANT
TUBE CONNECTING 12'X1/4 (SUCTIONS) ×1
TUBE CONNECTING 12X1/4 (SUCTIONS) ×2 IMPLANT
UNDERPAD 30X30 INCONTINENT (UNDERPADS AND DIAPERS) ×3 IMPLANT
WATER STERILE IRR 1000ML POUR (IV SOLUTION) ×1 IMPLANT
YANKAUER SUCT BULB TIP NO VENT (SUCTIONS) ×3 IMPLANT

## 2015-12-30 NOTE — Progress Notes (Signed)
Inpatient Diabetes Program Recommendations  AACE/ADA: New Consensus Statement on Inpatient Glycemic Control (2015)  Target Ranges:  Prepandial:   less than 140 mg/dL      Peak postprandial:   less than 180 mg/dL (1-2 hours)      Critically ill patients:  140 - 180 mg/dL   Results for Raymond Winters, Raymond Winters (MRN [id number]) as of 12/30/2015 15:20  Ref. Range 12/30/2015 07:42 12/30/2015 08:24 12/30/2015 12:53 12/30/2015 13:58  Glucose-Capillary Latest Ref Range: 65-99 mg/dL 48 (L) 86 55 (L) 409154 (H)    Admit with: Left Deltoid Abscess  History: DM, Heroin Abuse  Home DM Meds: Lantus 25 units QHS       Glipizide 10 mg daily       Metformin 1000 mg bid  Current Insulin Orders: Lantus 8 units QHS      Novolog 3 units tidwc      Novolog Sensitive Correction Scale/ SSI (0-9 units) TID AC      MD- Note patient with Hypoglycemia this AM.  NPO for possible I&D today or tomorrow.  Please consider further reduction of Lantus to 5 units QHS if patient continues to have fasting Hypoglycemia.     --Will follow patient during hospitalization--  Ambrose FinlandJeannine Johnston Charnelle Bergeman RN, MSN, CDE Diabetes Coordinator Inpatient Glycemic Control Team Team Pager: [phone] (8a-5p)'

## 2015-12-30 NOTE — Progress Notes (Signed)
Family Medicine Teaching Service Daily Progress Note Intern Pager: [phone]  Patient name: Raymond Winters Medical record number: [id number] Date of birth: [birth year] Age: 55 y.o. Gender: male  Primary Care Provider: No PCP Per Patient Consultants: Orthopedics Code Status: Full  Pt Overview and Major Events to Date:  7/4 admitted for L shoulder pain, surgical debridement of muscle w/ wound vac placement 7/7 repeat surgical debridement of muscle, wound vac replaced  Assessment and Plan: Raymond ButteryGary Winters is a 55 y.o. male presenting with left deltoid soft tissue abscess. PMH is significant for heroin abuse, T2DM, HLD, HTN.   # Left Deltoid Abscess. 17x7cm abscess in left shoulder found on CT. Secondary to injection on heroin into left deltoid. No evidence of osteomyelitis on CT. Vitals stable with no other signs of systemic infection. Doppler neg for DVT. WBC wnl. Patient c/o continued L arm pain but states is unchanged from a few days ago. DC'd vanc (7/4>7/9) - Per Ortho - return to OR for repeat I&D in next few days likely 7/10 or 7/11 - continue Zosyn (received 7/4>7/7, restarted 7/9) - f/u blood cultures NG x4 days - wound culture: moderate strep viridans,  mixed anaerobes - up with PT/OT - pain control on norco (4 doses in last 24 hours)  Has not had Dilaudid in 24 hours  # Heroin Abuse. Daily user for several years. HIV negative in 2015. HCV positive (see below problem). Also with "occasional" alcohol use.  CIWA 2 ,10. - CIWA - HIV nonreactuive - Social work consult.   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 No documentation of treatment for this. LFTs (AST34 ALT19 tbili 0.8) and INR(1.42) wnl - Will need follow up with ID (has not seen them since 2014) - monitor CMP/INR - UDS pos for opiates and cocaine  # Hyponatremia, resolved. Na 127 on admission, now 138. Possibly secondary to dehydration/malnutrition (appeared dry on admit exam).  - SLIV - Trend BMP  # T2DM. On  metformin and lantus 25U nightly at home. A1c 12.7 in January. A1c 11.1 (12/26/15) This am (7/10) hypoglycemia CBG 48, improved with juice and amp D50 to 86. Likely d/t improving infection and being NPO - Decrease to lantus 8U - D5 1/2NS mIVF while NPO for hypoglycemic episodes - sSSI - monitor CBG - DC on metformin only  # HLD. - Holding home pravastatin, consider restart given LFTs wnl  FEN/GI: Heart health diet, SLIV Prophylaxis: Lovenox. Scds. ambulation  Disposition: Admitted to med surg pending above management.   Subjective:  Patient reports that his left shoulder pain continues to bother him but states is sufficiently controlled on current medications. He acknowledges that he is going for another I&D this evening. He is concerned about how his blood sugar has been dropping this morning.   Objective: Temp:  [97.4 F (36.3 C)-98.5 F (36.9 C)] 97.4 F (36.3 C) (07/10 0557) Pulse Rate:  [76-80] 80 (07/09 2110) Resp:  [18] 18 (07/09 2110) BP: (96-111)/(50-66) 109/63 mmHg (07/10 0557) SpO2:  [100 %] 100 % (07/10 0557)   Physical Exam:  General: Thin, chronically ill appearing male in NAD lying in hospital bed Eyes: scleral icterus noted, EOMI ENTM: poor dentition, moist mucus membranes Cardiovascular: Distant heart sounds. No murmurs appreciated Respiratory: NWOB on room air, CTAB Abdomen: flat. +BS, S, NT, ND MSK: L deltoid area with wound vac C/D/I draining serosanguinous fluid with L lower arm edema without erythema, moderate induration at lesion site, 2+ radial pulses b/l. R arm edema  without erythema, IV site not infiltrated. Skin: Warm, dry Neuro: responds to commands and questioning appropriately Psych: stable mood and affect flat.  Laboratory:  Recent Labs Lab 12/25/15 0124 12/28/15 1926 12/29/15 0456  WBC 16.3* 5.0 5.1  HGB 12.3* 8.0* 7.9*  HCT 36.5* 25.3* 24.1*  PLT 215 207 221    Recent Labs Lab 12/25/15 0124 12/28/15 1926 12/29/15 0456  12/30/15 0531  NA 133* 133* 134* 138  K 3.9 4.5 4.3 4.6  CL 98* 101 103 106  CO2 BUN CREATININE 1.09 0.92 0.88 0.97  CALCIUM 7.9* 7.7* 7.7* 7.9*  PROT 6.1*  --   --   --   BILITOT 0.8  --   --   --   ALKPHOS 82  --   --   --   ALT 19  --   --   --   AST 34  --   --   --   GLUCOSE 99 271* 291* 59*     Imaging/Diagnostic Tests:  Left Upper Extremity Venous Duplex Evaluation Study information:  Study status: Routine. Procedure: The left internal jugular, left subclavian, left axillary, left basilic, left cephalic, left brachial, left radial, left ulnar, right internal jugular and right subclavian veins were studied. A vascular evaluation was performed with the patient in the supine position. The study was technically limited due to edema. Left upper extremity venous duplex study.  Doppler flow study including B-mode compression maneuvers of all visualized segments, color flow Doppler and selected views of pulsed wave Doppler. Birthdate: Patient birthdate: [birth year]. Age: Patient is 55 yr old. Sex: Gender: male. Study date: Study date: 12/26/2015. Study time: 06:10 PM. Location: Vascular laboratory. Patient status: Inpatient.   Raymond Her, DO 12/30/2015, 7:18 AM PGY-1, [facility] FPTS Intern pager: [phone], text pages welcome

## 2015-12-30 NOTE — Anesthesia Preprocedure Evaluation (Signed)
Anesthesia Evaluation  Patient identified by MRN, date of birth, ID band Patient awake    Reviewed: Allergy & Precautions, NPO status , Patient's Chart, lab work & pertinent test results  History of Anesthesia Complications Negative for: history of anesthetic complications  Airway Mallampati: II  TM Distance: >3 FB Neck ROM: Full    Dental  (+) Poor Dentition   Pulmonary neg shortness of breath, neg sleep apnea, neg COPD, neg recent URI, Current Smoker,    Pulmonary exam normal breath sounds clear to auscultation       Cardiovascular hypertension, Normal cardiovascular exam Rhythm:Regular Rate:Normal     Neuro/Psych PSYCHIATRIC DISORDERS Depression negative neurological ROS     GI/Hepatic negative GI ROS, (+)     substance abuse  alcohol use, cocaine use and IV drug use,   Endo/Other  diabetes, Insulin Dependent  Renal/GU negative Renal ROS  negative genitourinary   Musculoskeletal   Abdominal   Peds negative pediatric ROS (+)  Hematology  (+) anemia ,   Anesthesia Other Findings   Reproductive/Obstetrics                             Anesthesia Physical Anesthesia Plan  ASA: III  Anesthesia Plan: General   Post-op Pain Management:    Induction: Intravenous  Airway Management Planned: Oral ETT  Additional Equipment: None  Intra-op Plan:   Post-operative Plan: Extubation in OR  Informed Consent: I have reviewed the patients History and Physical, chart, labs and discussed the procedure including the risks, benefits and alternatives for the proposed anesthesia with the patient or authorized representative who has indicated his/her understanding and acceptance.   Dental advisory given  Plan Discussed with: CRNA and Surgeon  Anesthesia Plan Comments:         Anesthesia Quick Evaluation

## 2015-12-30 NOTE — Transfer of Care (Signed)
Immediate Anesthesia Transfer of Care Note  Patient: Raymond Winters  Procedure(s) Performed: Procedure(s): IRRIGATION AND DEBRIDEMENT LEFT SHOULDER (Left)  Patient Location: PACU  Anesthesia Type:General  Level of Consciousness: awake, alert  and oriented  Airway & Oxygen Therapy: Patient Spontanous Breathing and Patient connected to nasal cannula oxygen  Post-op Assessment: Report given to RN, Post -op Vital signs reviewed and stable and Patient moving all extremities X 4  Post vital signs: Reviewed and stable  Last Vitals:  Filed Vitals:   12/30/15 1453 12/30/15 1841  BP: 91/70 129/75  Pulse:  64  Temp:  36.4 C  Resp:  10    Last Pain:  Filed Vitals:   12/30/15 1843  PainSc: 10-Worst pain ever      Patients Stated Pain Goal: 2 (12/30/15 1304)  Complications: No apparent anesthesia complications

## 2015-12-30 NOTE — Progress Notes (Signed)
    Patient's surgical wound was healthy and viable today.  I was able to partially close the wound and vac the rest.  Patient will need social work to set up home health nursing and home vac for wound vac changes every Monday, Wednesday, Friday.  The dimensions of the wound are 10 cm x 7 cm x 2 cm with minimal undermining.  No tunneling.  I will need to see the patient in 1 week for another wound check.  Once he has formed enough granulation tissue, I will bring him back as an outpatient for skin grafting.  I also recommend a formal ID consult for abx to mop up any possible residual infection as the infection was severe and had multiple organisms.  ID can also assist with treating the "gram variable rods" that grew from the intraop cultures.  I will see the patient in the morning.  Raymond Winters. Michael Bitania Shankland, MD [facility] [phone] 8:19 PM

## 2015-12-30 NOTE — Progress Notes (Addendum)
Hypoglycemic Event  CBG: 55  Treatment: D50 IV 50 mL  Symptoms: Hungry and Vision changes  CBG 154 1:59 PM   Possible Reasons for Event: Inadequate meal intake  Comments/MD notified:    L'ESPERANCE, Ashliegh Parekh C

## 2015-12-30 NOTE — Progress Notes (Signed)
Hypoglycemic Event  CBG: 48  Treatment: D50 IV 50 mL  Symptoms: Hungry  Follow-up CBG: Time: 8:25 AM CBG Result: 86 MD at bedside  Possible Reasons for Event: Inadequate meal intake  Comments/MD notified: MD paged to make aware and clarify IV fluid orders    Raymond Winters, Raymond Winters

## 2015-12-30 NOTE — Anesthesia Postprocedure Evaluation (Signed)
Anesthesia Post Note  Patient: Raymond Winters  Procedure(s) Performed: Procedure(s) (LRB): IRRIGATION AND DEBRIDEMENT LEFT SHOULDER (Left)  Patient location during evaluation: PACU Anesthesia Type: General Level of consciousness: sedated Pain management: pain level controlled Vital Signs Assessment: post-procedure vital signs reviewed and stable Respiratory status: spontaneous breathing and respiratory function stable Cardiovascular status: stable Anesthetic complications: no    Last Vitals:  Filed Vitals:   12/30/15 2015 12/30/15 2026  BP:  115/60  Pulse: 67 68  Temp:  36.7 C  Resp: 13 19    Last Pain:  Filed Vitals:   12/30/15 2050  PainSc: 4                  Seylah Wernert DANIEL

## 2015-12-30 NOTE — Brief Op Note (Signed)
   Brief Op Note  Date of Surgery: 12/30/2015  Preoperative Diagnosis: infected shoulder  Postoperative Diagnosis: same  Procedure: Procedure(s): IRRIGATION AND DEBRIDEMENT LEFT SHOULDER  Implants: none  Surgeons: Surgeon(s): Treena Cosman Donnelly StagerM Tahni Porchia, MD  Anesthesia: General  Drains: Wound vac  Estimated Blood Loss: See anesthesia record  Complications: None  Condition to PACU: Stable  Moustafa Mossa Glee ArvinMichael Mignon Bechler, MD [facility] 12/30/2015 6:25 PM

## 2015-12-30 NOTE — Progress Notes (Signed)
PT Cancellation Note  Patient Details Name: Larinda ButteryGary Shavers MRN: [id number] DOB: August 14, 1960   Cancelled Treatment:    Reason Eval/Treat Not Completed: Patient declined, no reason specified. Pt reported feeling unwell today as he is NPO. PT will follow up with pt as appropriate.   DillardJennifer Ulyses [place], [place], [place] [phone]  12/30/2015, 9:08 AM

## 2015-12-30 NOTE — Progress Notes (Signed)
Called Pharm; stated  Ancef to be given in OR as their stock med.

## 2015-12-30 NOTE — Progress Notes (Signed)
Received patient from PACU. Patient AOx4, VS stable, Wound VAC running, pain at 3/10 and stated he's hungry.  Will provide for [employer] sandwich and gave him to drink.

## 2015-12-30 NOTE — Op Note (Signed)
   Date of Surgery: 12/30/2015  INDICATIONS: 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  PREOPERATIVE DIAGNOSIS: Left shoulder deep, complex intramuscular and submuscular abscess  POSTOPERATIVE DIAGNOSIS: Same.  PROCEDURE: 1. Debridement of muscle, subcutaneous tissue, skin 19 x 15 x 5 cm 2. Wound vac placement <50 sq cm 3. Adjacent tissue rearrangement 10 x 7 cm  SURGEON: N. Glee ArvinMichael Sadler Teschner, M.D.  ASSIST: none.  ANESTHESIA:  general  IV FLUIDS AND URINE: See anesthesia.  ESTIMATED BLOOD LOSS: 100 mL.  IMPLANTS: none  DRAINS: wound vac  COMPLICATIONS: None.  DESCRIPTION OF PROCEDURE: The patient was brought to the operating room and placed supine on the operating table.  The patient had been signed prior to the procedure and this was documented. The patient had the anesthesia placed by the anesthesiologist.  A time-out was performed to confirm that this was the correct patient, site, side and location.  Patient was placed in the lateral position in the bean bag with all bony prominences well padded.  The patient had the operative extremity prepped and draped in the standard surgical fashion.    After removal of the wound vac, there was healthy viable muscle and skin.  There were no gross signs of continued infection  I then performed gentle sharp excisional debridement of the deltoid muscle and the subcutaneous tissue and skin with rongeur and knife.  All tissue surfaces had healthy punctate bleeding.   After thorough debridement, I irrigated with 3 L of normal saline.  I performed adjacent tissue rearrangement in order to partially close the wound.  The rest of thewound was then dressed with a wound vac at -125 mm Hg.  Patient tolerated procedure well.  Raymond ReelN. Michael Kyrillos Adams, MD [facility] [phone]

## 2015-12-30 NOTE — Anesthesia Procedure Notes (Signed)
Procedure Name: Intubation Date/Time: 12/30/2015 5:42 PM Performed by: Little IshikawaMERCER, Demarrion Meiklejohn L Pre-anesthesia Checklist: Patient identified, Emergency Drugs available, Suction available, Patient being monitored and Timeout performed Patient Re-evaluated:Patient Re-evaluated prior to inductionOxygen Delivery Method: Circle system utilized Preoxygenation: Pre-oxygenation with 100% oxygen Intubation Type: IV induction Laryngoscope Size: Mac and 4 Grade View: Grade III Tube type: Oral Tube size: 7.5 mm Number of attempts: 2 Airway Equipment and Method: Stylet Placement Confirmation: ETT inserted through vocal cords under direct vision,  positive ETCO2 and breath sounds checked- equal and bilateral Secured at: 22 cm Tube secured with: Tape Dental Injury: Teeth and Oropharynx as per pre-operative assessment  Difficulty Due To: Difficult Airway- due to anterior larynx

## 2015-12-30 NOTE — Progress Notes (Signed)
Patient asking if he can have breakfast "since surgery isn't until late today". Attempted to contact Orthopedic office and office not open at this time. Patient made aware and education provided about "NPO" and the importance before a procedure.

## 2015-12-31 ENCOUNTER — Encounter (HOSPITAL_COMMUNITY): Payer: Self-pay | Admitting: Orthopaedic Surgery

## 2015-12-31 LAB — GLUCOSE, CAPILLARY
GLUCOSE-CAPILLARY: 295 mg/dL — AB (ref 65–99)
Glucose-Capillary: 160 mg/dL — ABNORMAL HIGH (ref 65–99)

## 2015-12-31 LAB — CREATININE, SERUM: CREATININE: 1.02 mg/dL (ref 0.61–1.24)

## 2015-12-31 MED ORDER — HYDROCODONE-ACETAMINOPHEN 5-325 MG PO TABS: 1.0000 | ORAL_TABLET | Freq: Four times a day (QID) | ORAL | Status: DC | PRN

## 2015-12-31 MED ORDER — AMOXICILLIN-POT CLAVULANATE 875-125 MG PO TABS: 1.0000 | ORAL_TABLET | Freq: Two times a day (BID) | ORAL | Status: AC

## 2015-12-31 MED FILL — GABAPENTIN 300 MG CAPSULE: 300 | 30 days supply | Qty: 30 | Fill #3

## 2015-12-31 MED FILL — MELOXICAM 15 MG TABLET: 15 | 30 days supply | Qty: 30 | Fill #3

## 2015-12-31 MED FILL — IBUPROFEN 800 MG TABLET: 800 | 10 days supply | Qty: 30 | Fill #0

## 2015-12-31 MED FILL — MIRTAZAPINE 15 MG TABLET: 15 | 30 days supply | Qty: 30 | Fill #0

## 2015-12-31 MED FILL — PRAVASTATIN NA 20 MG TAB: 20 | 30 days supply | Qty: 30 | Fill #4

## 2015-12-31 MED FILL — ?AMOX-CLAV 875-125 MG TABLE: 875-125 | 7 days supply | Qty: 14 | Fill #0

## 2015-12-31 MED FILL — HYDROCODON-APAP 5-325: 5-325 | 3 days supply | Qty: 15 | Fill #0

## 2015-12-31 MED FILL — metFORMIN HCL 1000 MG TABS: 1000 | 30 days supply | Qty: 60 | Fill #3

## 2015-12-31 MED FILL — LANTUS SOLOSTAR 100 UNITS/M: 100 | 30 days supply | Qty: 9 | Fill #1

## 2015-12-31 NOTE — Progress Notes (Signed)
Family Medicine Teaching Service Daily Progress Note Intern Pager: [phone]  Patient name: Raymond Winters Medical record number: [id number] Date of birth: [birth year] Age: 55 y.o. Gender: male  Primary Care Provider: No PCP Per Patient Consultants: Orthopedics Code Status: Full  Pt Overview and Major Events to Date:  7/4 admitted for L shoulder pain, surgical debridement of muscle w/ wound vac placement 7/7 repeat surgical debridement of muscle, wound vac replaced  Assessment and Plan: Raymond Winters is a 55 y.o. male presenting with left deltoid soft tissue abscess. PMH is significant for heroin abuse, T2DM, HLD, HTN.   # Left Deltoid Abscess. 17x7cm abscess in left shoulder found on CT. Secondary to injection on heroin into left deltoid. No evidence of osteomyelitis on CT. Vitals stable with no other signs of systemic infection. Doppler neg for DVT. WBC wnl. Patient c/o continued L arm pain but states is unchanged from a few days ago. DC'd vanc (7/4>7/9) - Per Ortho - wound partially closed. Can go home with home health nursing to manage home vac for wound vac with changes every MWF. Patient to follow up in 2 weeks for wound check with eventual plan for skin graft as outpatient - Zosyn (received 7/4>7/7, restarted 7/9) deescalate to Augmentin for DC - f/u blood cultures NG x4 days - wound culture: moderate strep viridans,  mixed anaerobes - up with PT/OT - pain control on norco (4 doses in last 24 hours)    # Heroin Abuse. Daily user for several years. HIV negative in 2015. HCV positive (see below problem). Also with "occasional" alcohol use.  CIWA 2 ,10. - CIWA - HIV nonreactuive - Social work consult.   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 No documentation of treatment for this. LFTs (AST34 ALT19 tbili 0.8) and INR(1.42) wnl - Will need follow up with ID (has not seen them since 2014) - monitor CMP/INR - UDS pos for opiates and cocaine  # Hyponatremia, resolved. Na  127 on admission, now 138. Possibly secondary to dehydration/malnutrition (appeared dry on admit exam).  - SLIV - Trend BMP  # T2DM. On metformin and lantus 25U nightly at home. A1c 12.7 in January. A1c 11.1 (12/26/15) This am (7/10) hypoglycemia CBG 48, improved with juice and amp D50 to 86. Likely d/t improving infection and being NPO - consider decreasing from lantus 8U to 5U if continues to be hypoglycemic, not necessary currently because was on D5 1/2NS overnight - sSSI - monitor CBG - DC on metformin only  # HLD. - Holding home pravastatin, consider restart given LFTs wnl  FEN/GI: Heart health diet, SLIV Prophylaxis: Lovenox. Scds. ambulation  Disposition: Admitted to med surg pending above management.    Subjective:  Patient reports that his left shoulder pain continues to bother him but states is sufficiently controlled on current medications. He is amenable to going home with home health.   Objective: Temp:  [97.6 F (36.4 C)-98.4 F (36.9 C)] 98.1 F (36.7 C) (07/11 0453) Pulse Rate:  [58-71] 66 (07/11 0453) Resp:  [8-19] 19 (07/11 0453) BP: (88-133)/(60-80) 111/66 mmHg (07/11 0453) SpO2:  [97 %-100 %] 100 % (07/11 0453)   Physical Exam:  General: Thin, chronically ill appearing male in NAD lying in hospital bed Eyes: scleral icterus noted, EOMI ENTM: poor dentition, moist mucus membranes Cardiovascular: Distant heart sounds. No murmurs appreciated Respiratory: NWOB on room air, CTAB Abdomen: flat. +BS, S, NT, ND MSK: L deltoid area with wound vac C/D/I draining serosanguinous fluid with  L lower arm edema without erythema, moderate induration at lesion site, 2+ radial pulses b/l. R arm edema without erythema, IV site not infiltrated. Skin: Warm, dry Neuro: responds to commands and questioning appropriately Psych: stable mood and affect flat.  Laboratory:  Recent Labs Lab 12/28/15 1926 12/29/15 0456 12/30/15 1033  WBC 5.0 5.1 4.2  HGB 8.0* 7.9* 7.7*  HCT  25.3* 24.1* 23.9*  PLT 207 221 222    Recent Labs Lab 12/25/15 0124 12/28/15 1926 12/29/15 0456 12/30/15 0531 12/31/15 0524  NA 133* 133* 134* 138  --   K 3.9 4.5 4.3 4.6  --   CL 98* 101 103 106  --   CO2 --   BUN --   CREATININE 1.09 0.92 0.88 0.97 1.02  CALCIUM 7.9* 7.7* 7.7* 7.9*  --   PROT 6.1*  --   --   --   --   BILITOT 0.8  --   --   --   --   ALKPHOS 82  --   --   --   --   ALT 19  --   --   --   --   AST 34  --   --   --   --   GLUCOSE 99 271* 291* 59*  --      Imaging/Diagnostic Tests:  Left Upper Extremity Venous Duplex Evaluation Study information:  Study status: Routine. Procedure: The left internal jugular, left subclavian, left axillary, left basilic, left cephalic, left brachial, left radial, left ulnar, right internal jugular and right subclavian veins were studied. A vascular evaluation was performed with the patient in the supine position. The study was technically limited due to edema. Left upper extremity venous duplex study.  Doppler flow study including B-mode compression maneuvers of all visualized segments, color flow Doppler and selected views of pulsed wave Doppler. Birthdate: Patient birthdate: 1961/06/17. Age: Patient is 55 yr old. Sex: Gender: male. Study date: Study date: 12/26/2015. Study time: 06:10 PM. Location: Vascular laboratory. Patient status: Inpatient.   Leland Her, DO 12/31/2015, 9:37 AM PGY-1, [place] [facility] FPTS Intern pager: [phone], text pages welcome

## 2015-12-31 NOTE — Care Management (Signed)
[employer] VAC has been released and will be delivered to patient's hospital room within 4 to 5 hours . Patient and bedside nurse aware.  Ronny FlurryHeather Yoan Sallade RN BSN [phone]

## 2015-12-31 NOTE — Care Management Note (Addendum)
Case Management Note  Patient Details  Name: Raymond ButteryGary Winters MRN: [id number] Date of Birth: [birth year]  Subjective/Objective:                    Action/Plan: VAC application remains on chart  unsigned . Spoke with Dr Roda ShuttersXu this am at 0900 , application faxed . Just spoke with DR Roda ShuttersXu office , they do have the forms but he has not signed. [facility] as soon as they are received back signed.  Spoke to patient regarding discharge planning . Will provide MATCH letter at discharge explained process of [employer] VAC application .   Patient will be staying with his sister [name] at discharge : [address] , [place] , [place] [address] phone [phone].   Choice offered , patient would like [facility] . Referral given to [facility].  [facility] will call to schedule appointment.  Patient voices understanding of all of above.  [facility] first available follow up appointment is January 20, 2016 at 10 am .  Provided [facility] dated 12-31-15 to 01-07-16 with 14 day exception for Norco.      Expected Discharge Date:                  Expected Discharge Plan:  Home w Home Health Services  In-House Referral:     Discharge planning Services  CM Consult  Post Acute Care Choice:  Durable Medical Equipment, Home Health Choice offered to:  Patient  DME Arranged:  Negative pressure wound device DME Agency:  [employer]  HH Arranged:  RN HH Agency:  [facility]  Status of Service:  In process, will continue to follow  If discussed at Long Length of Stay Meetings, dates discussed:    Additional Comments:  Kingsley PlanWile, [name], RN 12/31/2015, 10:59 AM

## 2015-12-31 NOTE — Clinical Social Work Note (Signed)
CSW was informed that other CSW met with patient and provided resources for substance abuse and completed SBIRT.  CSW to sign off please reconsult if other social work needs arise.  Jones Broom. [place], MSW, [place] 12/31/2015 9:01 AM

## 2015-12-31 NOTE — Discharge Instructions (Signed)
You were admitted for left shoulder abscess, you were treated with antibiotics in addition orthopedic surgery drained your abscess and put in a wound vacuum. You will be sent home with home nursing to help you manage the wound vacuum. Please continue to take your antibiotics and keep your wound clean. You will be following up with orthopedic surgery for further management as an outpatient.

## 2015-12-31 NOTE — Progress Notes (Signed)
Inpatient Diabetes Program Recommendations  AACE/ADA: New Consensus Statement on Inpatient Glycemic Control (2015)  Target Ranges:  Prepandial:   less than 140 mg/dL      Peak postprandial:   less than 180 mg/dL (1-2 hours)      Critically ill patients:  140 - 180 mg/dL  Results for Raymond Winters, Raymond Winters (MRN [id number]) as of 12/31/2015 13:03  Ref. Range 12/30/2015 08:24 12/30/2015 12:53 12/30/2015 13:58 12/30/2015 16:51 12/30/2015 18:43 12/30/2015 22:29 12/31/2015 07:19 12/31/2015 11:30  Glucose-Capillary Latest Ref Range: 65-99 mg/dL 86 55 (L) 154 (H) 126 (H) 99 256 (H) 295 (H) 160 (H)    Review of Glycemic Control  Current orders for Inpatient glycemic control: Lantus 8 units QHS, Novolog 3 units TID with meals for meal coverage, Novolog 0-9 units TID with meals, Novolog 0-5 units QHS  Inpatient Diabetes Program Recommendations: Correction (SSI): In reviewing the chart, noted patient did not receive any Novolog for correction last night for glucose of 256 mg/dl at 22:59 on 12/30/15. NURSING: Please administer Novolog correction as ordered when parameters are met. Diet: Please re-evaluate if Ensure supplements are needed. If so, please change from Ensure to Glucerna.   Thanks, Barnie Alderman, RN, MSN, CDE Diabetes Coordinator Inpatient Diabetes Program [phone] (Team Pager from [place] to [place]) [phone] ([facility] office) [phone] [facility] office) [phone] [facility] office)

## 2015-12-31 NOTE — Progress Notes (Signed)
Raymond Winters to be D/C'd Home per MD order.  Discussed with the patient and all questions fully answered.  VSS,IV catheter discontinued intact. Site without signs and symptoms of complications. Dressing and pressure applied.  An After Visit Summary was printed and given to the patient. Patient received prescription.  D/c education completed with patient/family including follow up instructions, medication list, d/c activities limitations if indicated, with other d/c instructions as indicated by MD - patient able to verbalize understanding, all questions fully answered.   Patient instructed to return to ED, call 911, or call MD for any changes in condition.   Patient to be escorted via WC, and D/C home via private auto.  Patient waiting for home wound vac to be delivered to room before discharging home.   L'ESPERANCE, Belkys Henault C 12/31/2015 2:47 PM

## 2015-12-31 NOTE — Progress Notes (Signed)
   Subjective:  Patient reports pain as mild.    Objective:   VITALS:   Filed Vitals:   12/30/15 2000 12/30/15 2015 12/30/15 2026 12/31/15 0453  BP:   115/60 111/66  Pulse: 63 67 68 66  Temp: 97.8 F (36.6 C)  98.1 F (36.7 C) 98.1 F (36.7 C)  TempSrc:   Oral Oral  Resp: 18 13 19 19   Height:      Weight:      SpO2: 100% 97% 100% 100%    Exam stable   Lab Results  Component Value Date   WBC 4.2 12/30/2015   HGB 7.7* 12/30/2015   HCT 23.9* 12/30/2015   MCV 81.3 12/30/2015   PLT 222 12/30/2015     Assessment/Plan:  1 Day Post-Op   - Expected postop acute blood loss anemia - will monitor for symptoms - Patient's surgical wound was healthy and viable yesterday. I was able to partially close the wound and vac the rest. Patient will need social work to set up home health nursing and home vac for wound vac changes every Monday, Wednesday, Friday. The dimensions of the wound are 10 cm x 7 cm x 2 cm with minimal undermining. No tunneling. I will need to see the patient in 2 weeks for another wound check on a Mon, Wed, or Fri. Once he has formed enough granulation tissue, I will bring him back as an outpatient for skin grafting. I also recommend a formal ID consult for abx to mop up any possible residual infection as the infection was severe and had multiple organisms. ID can also assist with treating the "gram variable rods" that grew from the intraop cultures.  Cheral AlmasXu, Naiping Michael 12/31/2015, 7:58 AM [phone]

## 2015-12-31 NOTE — Progress Notes (Addendum)
MD paged about CBG result of 295. Patient eating and drinking well after surgery. Clarified dextrose fluid orders. Orders placed.

## 2015-12-31 NOTE — Progress Notes (Signed)
   12/31/15 1500  Clinical Encounter Type  Visited With Patient  Visit Type Follow-up  Referral From Patient  Consult/Referral To Chaplain  Spiritual Encounters  Spiritual Needs Prayer;Emotional  Stress Factors  Patient Stress Factors Health changes  Chaplain follow up made, patient in good spirits verbalizes surgeries having been successful, preparing to go home, provided active listening and emotional support and prayer.

## 2015-12-31 NOTE — Progress Notes (Signed)
Physical Therapy Treatment Patient Details Name: Raymond ButteryGary Winters MRN: [id number] DOB: 11/06/1960 Today's Date: 12/31/2015    History of Present Illness Admitted with left shoulder abscess and underwent I/D on 7/4 and again on 7/7 and is now using vacuum assisted pressure to wound. PMH significant for daily heroin use, DM, and HTN.     PT Comments    Patient is progressing toward mobility goals. Improved gait mechanics and balance demo'd this session. Tolerated stair training. Needs cues at times to maintain NWB L UE. Current plan remains appropriate.   Follow Up Recommendations  No PT follow up;Supervision - Intermittent     Equipment Recommendations  None recommended by PT    Recommendations for Other Services       Precautions / Restrictions Precautions Precautions: Fall Restrictions Weight Bearing Restrictions: Yes LUE Weight Bearing: Non weight bearing    Mobility  Bed Mobility Overal bed mobility: Independent                Transfers Overall transfer level: Needs assistance Equipment used: None   Sit to Stand: Supervision         General transfer comment: cues to maintain NWB L UE  Ambulation/Gait Ambulation/Gait assistance: Supervision;Min guard Ambulation Distance (Feet): 500 Feet Assistive device: None (pushed IV pole (reported uses SPC at home)) Gait Pattern/deviations: Step-through pattern;Decreased stride length;Narrow base of support     General Gait Details: 2 standing rest breaks due to pt's c/o lightheadedness; cues for wider BOS and cadence; grossly supervision for safety with min guard for head turns with a little unsteadiness noted but no LOB   Stairs Stairs: Yes Stairs assistance: Min guard Stair Management: One rail Right;Forwards;Sideways Number of Stairs: 4 General stair comments: educated on sequencing and cues for NWB L UE; no LOB (has 6-9 stairs at home)  Wheelchair Mobility    Modified Rankin (Stroke Patients Only)        Balance     Sitting balance-Leahy Scale: Normal       Standing balance-Leahy Scale: Good                      Cognition Arousal/Alertness: Awake/alert Behavior During Therapy: WFL for tasks assessed/performed Overall Cognitive Status: Within Functional Limits for tasks assessed                      Exercises      General Comments General comments (skin integrity, edema, etc.): 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; edema in bilat feet with pt c/o pain in L > R; bilat LE elevated end of session      Pertinent Vitals/Pain Pain Assessment: 0-10 Pain Score: 7  Pain Location: L UE Pain Descriptors / Indicators: Guarding;Sore Pain Intervention(s): Monitored during session;Premedicated before session;Repositioned    Home Living                      Prior Function            PT Goals (current goals can now be found in the care plan section) Acute Rehab PT Goals Patient Stated Goal: go home PT Goal Formulation: With patient Time For Goal Achievement: 01/04/16 Potential to Achieve Goals: Good Progress towards PT goals: Progressing toward goals    Frequency  Min 2X/week    PT Plan Current plan remains appropriate    Co-evaluation             End of Session Equipment Utilized During Treatment: Gait  belt Activity Tolerance: Patient tolerated treatment well Patient left: in chair;with call bell/phone within reach;with nursing/sitter in room     Time: 0924-0954 PT Time Calculation (min) (ACUTE ONLY): 30 min  Charges:  $Gait Training: 8-22 mins $Therapeutic Activity: 8-22 mins                    G Codes:      Derek M[place], PTA Pager: [phone]   12/31/2015, 10:09 AM

## 2016-01-03 ENCOUNTER — Emergency Department (HOSPITAL_COMMUNITY): Payer: Self-pay

## 2016-01-03 ENCOUNTER — Observation Stay (HOSPITAL_COMMUNITY)
Admission: EM | Admit: 2016-01-03 | Discharge: 2016-01-06 | Disposition: A | Discharge status: Home / Self Care | Payer: Self-pay | Attending: Family Medicine | Admitting: Family Medicine

## 2016-01-03 ENCOUNTER — Encounter (HOSPITAL_COMMUNITY): Payer: Self-pay | Admitting: Oncology

## 2016-01-03 DIAGNOSIS — K709 Alcoholic liver disease, unspecified: Secondary | ICD-10-CM | POA: Insufficient documentation

## 2016-01-03 DIAGNOSIS — E860 Dehydration: Secondary | ICD-10-CM | POA: Insufficient documentation

## 2016-01-03 DIAGNOSIS — R197 Diarrhea, unspecified: Secondary | ICD-10-CM

## 2016-01-03 DIAGNOSIS — E872 Acidosis, unspecified: Secondary | ICD-10-CM | POA: Diagnosis present

## 2016-01-03 DIAGNOSIS — F17210 Nicotine dependence, cigarettes, uncomplicated: Secondary | ICD-10-CM | POA: Insufficient documentation

## 2016-01-03 DIAGNOSIS — E785 Hyperlipidemia, unspecified: Secondary | ICD-10-CM | POA: Insufficient documentation

## 2016-01-03 DIAGNOSIS — E1165 Type 2 diabetes mellitus with hyperglycemia: Secondary | ICD-10-CM | POA: Insufficient documentation

## 2016-01-03 DIAGNOSIS — R739 Hyperglycemia, unspecified: Secondary | ICD-10-CM

## 2016-01-03 DIAGNOSIS — Z7982 Long term (current) use of aspirin: Secondary | ICD-10-CM | POA: Insufficient documentation

## 2016-01-03 DIAGNOSIS — B182 Chronic viral hepatitis C: Secondary | ICD-10-CM | POA: Insufficient documentation

## 2016-01-03 DIAGNOSIS — D72819 Decreased white blood cell count, unspecified: Secondary | ICD-10-CM | POA: Insufficient documentation

## 2016-01-03 DIAGNOSIS — K860 Alcohol-induced chronic pancreatitis: Secondary | ICD-10-CM | POA: Diagnosis present

## 2016-01-03 DIAGNOSIS — K529 Noninfective gastroenteritis and colitis, unspecified: Principal | ICD-10-CM | POA: Insufficient documentation

## 2016-01-03 DIAGNOSIS — D638 Anemia in other chronic diseases classified elsewhere: Secondary | ICD-10-CM | POA: Insufficient documentation

## 2016-01-03 DIAGNOSIS — F1021 Alcohol dependence, in remission: Secondary | ICD-10-CM | POA: Insufficient documentation

## 2016-01-03 DIAGNOSIS — K861 Other chronic pancreatitis: Secondary | ICD-10-CM | POA: Insufficient documentation

## 2016-01-03 DIAGNOSIS — E46 Unspecified protein-calorie malnutrition: Secondary | ICD-10-CM | POA: Diagnosis present

## 2016-01-03 DIAGNOSIS — IMO0002 Reserved for concepts with insufficient information to code with codable children: Secondary | ICD-10-CM

## 2016-01-03 DIAGNOSIS — L8990 Pressure ulcer of unspecified site, unspecified stage: Secondary | ICD-10-CM | POA: Diagnosis present

## 2016-01-03 DIAGNOSIS — M25512 Pain in left shoulder: Secondary | ICD-10-CM | POA: Insufficient documentation

## 2016-01-03 DIAGNOSIS — Z79899 Other long term (current) drug therapy: Secondary | ICD-10-CM | POA: Insufficient documentation

## 2016-01-03 DIAGNOSIS — E118 Type 2 diabetes mellitus with unspecified complications: Secondary | ICD-10-CM

## 2016-01-03 DIAGNOSIS — E43 Unspecified severe protein-calorie malnutrition: Secondary | ICD-10-CM | POA: Insufficient documentation

## 2016-01-03 DIAGNOSIS — F1110 Opioid abuse, uncomplicated: Secondary | ICD-10-CM

## 2016-01-03 DIAGNOSIS — F1020 Alcohol dependence, uncomplicated: Secondary | ICD-10-CM | POA: Diagnosis present

## 2016-01-03 DIAGNOSIS — Z794 Long term (current) use of insulin: Secondary | ICD-10-CM

## 2016-01-03 DIAGNOSIS — Z681 Body mass index (BMI) 19 or less, adult: Secondary | ICD-10-CM | POA: Insufficient documentation

## 2016-01-03 DIAGNOSIS — Z7984 Long term (current) use of oral hypoglycemic drugs: Secondary | ICD-10-CM | POA: Insufficient documentation

## 2016-01-03 HISTORY — DX: Alcoholic liver disease, unspecified: K70.9

## 2016-01-03 HISTORY — DX: Acidosis: E87.2

## 2016-01-03 HISTORY — DX: Unspecified protein-calorie malnutrition: E46

## 2016-01-03 HISTORY — DX: Alcohol dependence, uncomplicated: F10.20

## 2016-01-03 HISTORY — DX: Alcohol-induced chronic pancreatitis: K86.0

## 2016-01-03 LAB — RAPID URINE DRUG SCREEN, HOSP PERFORMED
AMPHETAMINES: NOT DETECTED
Barbiturates: NOT DETECTED
Benzodiazepines: NOT DETECTED
Cocaine: NOT DETECTED
Opiates: POSITIVE — AB
TETRAHYDROCANNABINOL: NOT DETECTED

## 2016-01-03 MED ORDER — SODIUM CHLORIDE 0.9 % IV BOLUS (SEPSIS)
1000.0000 mL | Freq: Once | INTRAVENOUS | Status: AC
  Administered 2016-01-03: 1000 mL via INTRAVENOUS

## 2016-01-03 MED ORDER — OXYCODONE HCL 5 MG PO TABS
10.0000 mg | ORAL_TABLET | Freq: Once | ORAL | Status: AC
  Administered 2016-01-04: 10 mg via ORAL
  Filled 2016-01-03: qty 2

## 2016-01-03 MED ORDER — SODIUM CHLORIDE 0.9 % IV BOLUS (SEPSIS)
500.0000 mL | Freq: Once | INTRAVENOUS | Status: AC
  Administered 2016-01-04: via INTRAVENOUS

## 2016-01-03 NOTE — ED Notes (Signed)
RN is starting 2 IV's and blood work

## 2016-01-03 NOTE — ED Provider Notes (Signed)
CSN: [id number]     Arrival date & time 01/03/16  1929 History  By signing my name below, I, Gwenlyn Fudge, attest that this documentation has been prepared under the direction and in the presence of Abigail Butts, [place]. Electronically Signed: Gwenlyn Fudge, ED Scribe. 01/03/2016. 10:48 PM.    Chief Complaint  Patient presents with  . Diarrhea   The history is provided by the patient, medical records and a relative. No language interpreter was used.    HPI Comments: Raymond Winters is a 54 y.o. male with PMHx of IV drug abuse, Alcoholism, IDDM, and Depression who presents to the Emergency Department complaining of gradual onset episodic diarrhea onset 1 week. He states episodes occur 4x a day. He also reports associated lightheadedness, confusion, lethargy. Pt relative states that pt has had some problems with memory and actual onset is 1 year. Pt reports unintentional 30 lb weight loss over the last year. Pt states diarrhea is soft, but not watery. He reports that after every meal, he experiences diarrhea. Pt reports he had shoulder surgery for an abscess on 12/31/2015 and is compliant with his prescribed antibiotics. Pt states he has taken Hydrocodone for shoulder pain. Pt denies shortness of breath, chest pain, cough, congestion, or any urinary issues. Pt reports he has not been taking his Metformin since he was in the hospital on 12/24/2015 Pt reports consumption of beer. Pt reports daily heroin usage of 1/4 to 1/2 a gram, but denies any other illicit drug usage. Pt denies any recent travel.   Past Medical History  Diagnosis Date  . Heroin abuse   . Type II diabetes mellitus ([place])   . Depression    Past Surgical History  Procedure Laterality Date  . I&d extremity Right 03/20/2013    Procedure: IRRIGATION AND DEBRIDEMENT EXTREMITY;  Surgeon: Schuyler Amor, MD;  Location: [place] [facility];  Service: Orthopedics;  Laterality: Right;  . I&d extremity Left 12/24/2015    Procedure:  DEBRIDEMENT OF MUSCLE,  SUBCUTANEOUS TISSUE LEFT SHOULDER;  Surgeon: Leandrew Koyanagi, MD;  Location: [place];  Service: Orthopedics;  Laterality: Left;  . Application of wound vac Left 12/24/2015    Procedure: APPLICATION OF WOUND VAC; LEFT SHOULDER;  Surgeon: Leandrew Koyanagi, MD;  Location: [place];  Service: Orthopedics;  Laterality: Left;  . Irrigation and debridement shoulder Left 12/27/2015    Procedure: IRRIGATION AND DEBRIDEMENT LEFT SHOULDER;  Surgeon: Leandrew Koyanagi, MD;  Location: [place];  Service: Orthopedics;  Laterality: Left;  . Irrigation and debridement shoulder Left 12/30/2015    Procedure: IRRIGATION AND DEBRIDEMENT LEFT SHOULDER;  Surgeon: Leandrew Koyanagi, MD;  Location: [facility];  Service: Orthopedics;  Laterality: Left;   Family History  Problem Relation Age of Onset  . Diabetes Mother   . Heart disease Mother   . Hypertension Mother   . Stroke Mother   . Vision loss Mother   . Heart disease Father   . Hypertension Father    Social History  Substance Use Topics  . Smoking status: Current Every Day Smoker -- 0.50 packs/day for 21 years    Types: Cigarettes  . Smokeless tobacco: Never Used  . Alcohol Use: 16.8 oz/week    28 Cans of beer, 0 Standard drinks or equivalent per week     Comment: 12/25/2015 "~ 2, 16oz beers daily"    Review of Systems  Constitutional: Positive for fatigue.  HENT: Negative for congestion.   Respiratory: Negative for cough and shortness of breath.  Cardiovascular: Negative for chest pain.  Gastrointestinal: Positive for diarrhea.  Neurological: Positive for light-headedness.  All other systems reviewed and are negative.   Allergies  Ativan  Home Medications   Prior to Admission medications   Medication Sig Start Date End Date Taking? Authorizing Provider  amoxicillin-clavulanate (AUGMENTIN) 875-125 MG tablet Take 1 tablet by mouth 2 (two) times daily. 12/31/15 01/06/16 Yes Verner Mould, MD  aspirin EC 81 MG tablet Take 1 tablet (81 mg total) by mouth daily.  04/04/14  Yes Lance Bosch, NP  Cholecalciferol (VITAMIN D-3) 1000 UNITS CAPS Take 1,000 Units by mouth daily.    Yes Historical Provider, MD  gabapentin (NEURONTIN) 300 MG capsule Take 1 capsule (300 mg total) by mouth at bedtime. For diabetic nerve pain 06/11/15  Yes Lance Bosch, NP  glipiZIDE (GLUCOTROL) 10 MG tablet Take 10 mg by mouth daily before breakfast.   Yes Historical Provider, MD  HYDROcodone-acetaminophen (NORCO/VICODIN) 5-325 MG tablet Take 1-2 tablets by mouth every 6 (six) hours as needed. Patient taking differently: Take 1-2 tablets by mouth every 6 (six) hours as needed for severe pain.  12/31/15  Yes Vivi Barrack, MD  meloxicam (MOBIC) 15 MG tablet Take 1 tablet (15 mg total) by mouth daily. 07/24/15  Yes Tamela Oddi Hess, DO  metFORMIN (GLUCOPHAGE) 1000 MG tablet Take 1 tablet (1,000 mg total) by mouth 2 (two) times daily with a meal. 05/10/15  Yes Lance Bosch, NP  mirtazapine (REMERON) 15 MG tablet TAKE 1 TABLET BY MOUTH AT BEDTIME. 09/27/15  Yes Lance Bosch, NP  Multiple Vitamin (MULTIVITAMIN WITH MINERALS) TABS tablet Take 1 tablet by mouth daily.   Yes Historical Provider, MD  pravastatin (PRAVACHOL) 20 MG tablet Take 1 tablet (20 mg total) by mouth daily. Patient taking differently: Take 20 mg by mouth at bedtime.  05/10/15  Yes Lance Bosch, NP  ibuprofen (ADVIL,MOTRIN) 800 MG tablet Take 1 tablet (800 mg total) by mouth 3 (three) times daily. Patient taking differently: Take 800 mg by mouth every 8 (eight) hours as needed.  10/07/15   Lance Bosch, NP   BP 107/73 mmHg  Pulse 102  Temp(Src) 98 F (36.7 C) (Oral)  Resp 16  Ht 5' 7" (1.702 m)  Wt 110 lb (49.896 kg)  BMI 17.22 kg/m2   Physical Exam  Constitutional: No distress.  Awake, alert, cachectic and chronically ill-appearing  HENT:  Head: Normocephalic and atraumatic.  Mouth/Throat: Mucous membranes are dry. No oropharyngeal exudate.  Eyes: Conjunctivae are normal. No scleral icterus.  Neck: Normal  range of motion. Neck supple.  Cardiovascular: Normal rate, regular rhythm, normal heart sounds and intact distal pulses.   Pulmonary/Chest: Effort normal and breath sounds normal. No respiratory distress. He has no wheezes.  Equal chest expansion  Abdominal: Soft. Bowel sounds are normal. He exhibits no mass. There is no tenderness. There is no rebound and no guarding.  Musculoskeletal: Normal range of motion. He exhibits edema.  Small abrasions down spine Wound vac in place to to the left shoulder 2+ pitting edema bilaterally  Neurological: He is alert.  Speech is clear and goal oriented Moves extremities without ataxia  Skin: Skin is warm and dry. He is not diaphoretic.  Psychiatric: He has a normal mood and affect.  Nursing note and vitals reviewed.   ED Course  Procedures (including critical care time)  DIAGNOSTIC STUDIES: Oxygen Saturation is 100% on RA, normal by my interpretation.    COORDINATION OF CARE:  9:55 PM Discussed treatment plan with pt at bedside which includes lab work and [employer] and pt agreed to plan.  Labs Review Labs Reviewed  COMPREHENSIVE METABOLIC PANEL - Abnormal; Notable for the following:    Glucose, Bld 354 (*)    Calcium 8.1 (*)    Albumin 2.9 (*)    AST 88 (*)    Alkaline Phosphatase 145 (*)    All other components within normal limits  CBC WITH DIFFERENTIAL/PLATELET - Abnormal; Notable for the following:    WBC 3.7 (*)    RBC 3.35 (*)    Hemoglobin 8.8 (*)    HCT 27.9 (*)    All other components within normal limits  URINALYSIS, ROUTINE W REFLEX MICROSCOPIC (NOT AT [facility]) - Abnormal; Notable for the following:    Glucose, UA >1000 (*)    All other components within normal limits  URINE RAPID DRUG SCREEN, HOSP PERFORMED - Abnormal; Notable for the following:    Opiates POSITIVE (*)    All other components within normal limits  URINE MICROSCOPIC-ADD ON - Abnormal; Notable for the following:    Squamous Epithelial / LPF 0-5 (*)     Bacteria, UA RARE (*)    All other components within normal limits  CBG MONITORING, ED - Abnormal; Notable for the following:    Glucose-Capillary 370 (*)    All other components within normal limits  CULTURE, BLOOD (ROUTINE X 2)  CULTURE, BLOOD (ROUTINE X 2)  URINE CULTURE  GASTROINTESTINAL PANEL BY PCR, STOOL (REPLACES STOOL CULTURE)  C DIFFICILE QUICK SCREEN W PCR REFLEX  ETHANOL  I-STAT CG4 LACTIC ACID, ED  I-STAT CG4 LACTIC ACID, ED    Imaging Review Dg Chest Port 1 View  01/03/2016  CLINICAL DATA:  Initial evaluation for weight loss, diarrhea. EXAM: PORTABLE CHEST 1 VIEW COMPARISON:  Prior radiograph from 12/24/2015. FINDINGS: Cardiac and mediastinal silhouettes are stable in size and contour, and remain within normal limits. Lungs normally inflated. Minimal atelectasis/scarring at the right lung base. No consolidative airspace disease. No pulmonary edema or pleural effusion. No pneumothorax. No acute osseous abnormality. IMPRESSION: 1. Mild atelectasis/scarring at the right lung base. 2. No other active cardiopulmonary disease. Electronically Signed   By: Jeannine Boga M.D.   On: 01/03/2016 22:51   I have personally reviewed and evaluated these images and lab results as part of my medical decision-making.   MDM   Final diagnoses:  Uncontrolled type 2 diabetes mellitus with complication, with long-term current use of insulin (HCC)  Left shoulder pain  Diarrhea, unspecified type  Protein-calorie malnutrition, severe ([place])  Hyperglycemia  Heroin abuse   Raymond Winters presents with Nutrition, dehydration, hyperglycemia and diarrhea worse since his surgery and being placed on antibiotics. Patient appears generally ill.  Labs are reassuring. No evidence of DKA. Anemia at baseline. Patient has been unable to produce fecal sample here in the emergency department.  No evidence of sepsis.  Concern that pt will not tolerate diarrhea, dehydration and hyperglycemia in his  malnourished state.    3:54 AM  Discussed with Dr. Alcario Drought who reports that pt was d/c from [facility] teaching service on 7/11 and is therefore a bounce back to their service.    4:07 AM Discussed with Dr. Rosalyn Gess who will admit.    BP 106/73 mmHg  Pulse 72  Temp(Src) 98 F (36.7 C) (Oral)  Resp 15  Ht 5' 7" (1.702 m)  Wt 49.896 kg  BMI 17.22 kg/m2  SpO2  100%   I personally performed the services described in this documentation, which was scribed in my presence. The recorded information has been reviewed and is accurate.   6:04 AM Pt first lactic acid resulted at 3.54.  Pt has not met SIRS criteria, had no cardiac murmur, no fever or hypotension.  Highly doubt sepsis.  No splinter hemorrhages or petechiae.  No abx were given initially due to concern about potential C.Dif. Abd is soft and nontender.  Will continue fluid bolus and CT abd for further evaluation.    Jarrett Soho Crissa Sowder, PA-C 01/04/16 [id number]  Daleen Bo, MD 01/04/16 [phone]

## 2016-01-03 NOTE — ED Notes (Signed)
Pt had surgery on an abscess to his left shoulder on 12/24/15.  Currently a wound vac is in place.  Pt states he is on antibiotics d/t the abscess.  On Wednesday pt developed diarrhea.  Reports 4 episodes of diarrhea in the last 24 hours.  Pt reports being lightheaded when he stands up.

## 2016-01-03 NOTE — ED Notes (Signed)
Delay of meds and IVs from inability to get iv access. Iv team in room

## 2016-01-04 ENCOUNTER — Observation Stay (HOSPITAL_COMMUNITY): Payer: Self-pay

## 2016-01-04 DIAGNOSIS — M25512 Pain in left shoulder: Secondary | ICD-10-CM

## 2016-01-04 DIAGNOSIS — R197 Diarrhea, unspecified: Secondary | ICD-10-CM

## 2016-01-04 DIAGNOSIS — R739 Hyperglycemia, unspecified: Secondary | ICD-10-CM

## 2016-01-04 DIAGNOSIS — IMO0002 Reserved for concepts with insufficient information to code with codable children: Secondary | ICD-10-CM | POA: Diagnosis present

## 2016-01-04 DIAGNOSIS — Z794 Long term (current) use of insulin: Secondary | ICD-10-CM

## 2016-01-04 DIAGNOSIS — K529 Noninfective gastroenteritis and colitis, unspecified: Secondary | ICD-10-CM | POA: Diagnosis present

## 2016-01-04 DIAGNOSIS — E118 Type 2 diabetes mellitus with unspecified complications: Secondary | ICD-10-CM

## 2016-01-04 DIAGNOSIS — L8990 Pressure ulcer of unspecified site, unspecified stage: Secondary | ICD-10-CM | POA: Diagnosis present

## 2016-01-04 DIAGNOSIS — E1165 Type 2 diabetes mellitus with hyperglycemia: Secondary | ICD-10-CM

## 2016-01-04 DIAGNOSIS — E43 Unspecified severe protein-calorie malnutrition: Secondary | ICD-10-CM

## 2016-01-04 LAB — CBC WITH DIFFERENTIAL/PLATELET
BASOS ABS: 0 10*3/uL (ref 0.0–0.1)
Basophils Relative: 1 %
EOS PCT: 0 %
Eosinophils Absolute: 0 10*3/uL (ref 0.0–0.7)
HCT: 27.9 % — ABNORMAL LOW (ref 39.0–52.0)
Hemoglobin: 8.8 g/dL — ABNORMAL LOW (ref 13.0–17.0)
LYMPHS PCT: 49 %
Lymphs Abs: 1.8 10*3/uL (ref 0.7–4.0)
MCH: 26.3 pg (ref 26.0–34.0)
MCHC: 31.5 g/dL (ref 30.0–36.0)
MCV: 83.3 fL (ref 78.0–100.0)
MONO ABS: 0.2 10*3/uL (ref 0.1–1.0)
Monocytes Relative: 5 %
Neutro Abs: 1.7 10*3/uL (ref 1.7–7.7)
Neutrophils Relative %: 45 %
PLATELETS: 253 10*3/uL (ref 150–400)
RBC: 3.35 MIL/uL — ABNORMAL LOW (ref 4.22–5.81)
RDW: 14.6 % (ref 11.5–15.5)
WBC: 3.7 10*3/uL — ABNORMAL LOW (ref 4.0–10.5)

## 2016-01-04 LAB — BASIC METABOLIC PANEL
ANION GAP: 5 (ref 5–15)
BUN: 11 mg/dL (ref 6–20)
CALCIUM: 7.6 mg/dL — AB (ref 8.9–10.3)
CO2: 20 mmol/L — AB (ref 22–32)
CREATININE: 0.86 mg/dL (ref 0.61–1.24)
Chloride: 112 mmol/L — ABNORMAL HIGH (ref 101–111)
GFR calc Af Amer: >60 mL/min (ref >60)
GLUCOSE: 125 mg/dL — AB (ref 65–99)
Potassium: 4.2 mmol/L (ref 3.5–5.1)
Sodium: 137 mmol/L (ref 135–145)

## 2016-01-04 LAB — COMPREHENSIVE METABOLIC PANEL
ALBUMIN: 2.9 g/dL — AB (ref 3.5–5.0)
ALK PHOS: 145 U/L — AB (ref 38–126)
ALT: 58 U/L (ref 17–63)
AST: 88 U/L — AB (ref 15–41)
Anion gap: 8 (ref 5–15)
BILIRUBIN TOTAL: 0.4 mg/dL (ref 0.3–1.2)
BUN: 14 mg/dL (ref 6–20)
CO2: 23 mmol/L (ref 22–32)
Calcium: 8.1 mg/dL — ABNORMAL LOW (ref 8.9–10.3)
Chloride: 107 mmol/L (ref 101–111)
Creatinine, Ser: 0.89 mg/dL (ref 0.61–1.24)
GFR calc Af Amer: >60 mL/min (ref >60)
GFR calc non Af Amer: >60 mL/min (ref >60)
GLUCOSE: 354 mg/dL — AB (ref 65–99)
POTASSIUM: 4.7 mmol/L (ref 3.5–5.1)
Sodium: 138 mmol/L (ref 135–145)
TOTAL PROTEIN: 7.2 g/dL (ref 6.5–8.1)

## 2016-01-04 LAB — URINALYSIS, ROUTINE W REFLEX MICROSCOPIC
Bilirubin Urine: NEGATIVE
Hgb urine dipstick: NEGATIVE
Ketones, ur: NEGATIVE mg/dL
LEUKOCYTES UA: NEGATIVE
Nitrite: NEGATIVE
PH: 5.5 (ref 5.0–8.0)
Protein, ur: NEGATIVE mg/dL
SPECIFIC GRAVITY, URINE: 1.026 (ref 1.005–1.030)

## 2016-01-04 LAB — I-STAT CG4 LACTIC ACID, ED: Lactic Acid, Venous: 3.54 mmol/L (ref 0.5–1.9)

## 2016-01-04 LAB — ETHANOL: Alcohol, Ethyl (B): <5 mg/dL (ref <5)

## 2016-01-04 LAB — LACTIC ACID, PLASMA
LACTIC ACID, VENOUS: 3.5 mmol/L — AB (ref 0.5–1.9)
Lactic Acid, Venous: 3.3 mmol/L (ref 0.5–1.9)

## 2016-01-04 LAB — CBG MONITORING, ED: GLUCOSE-CAPILLARY: 370 mg/dL — AB (ref 65–99)

## 2016-01-04 LAB — TSH: TSH: 3.307 u[IU]/mL (ref 0.350–4.500)

## 2016-01-04 LAB — URINE MICROSCOPIC-ADD ON
RBC / HPF: NONE SEEN RBC/hpf (ref 0–5)
WBC UA: NONE SEEN WBC/hpf (ref 0–5)

## 2016-01-04 LAB — GLUCOSE, CAPILLARY
GLUCOSE-CAPILLARY: 413 mg/dL — AB (ref 65–99)
Glucose-Capillary: 240 mg/dL — ABNORMAL HIGH (ref 65–99)
Glucose-Capillary: 153 mg/dL — ABNORMAL HIGH (ref 65–99)

## 2016-01-04 MED ORDER — AMOXICILLIN-POT CLAVULANATE 875-125 MG PO TABS
1.0000 | ORAL_TABLET | Freq: Two times a day (BID) | ORAL | Status: DC
  Administered 2016-01-04 – 2016-01-06 (×5): 1 via ORAL
  Filled 2016-01-04 (×5): qty 1

## 2016-01-04 MED ORDER — IOPAMIDOL (ISOVUE-300) INJECTION 61%
80.0000 mL | Freq: Once | INTRAVENOUS | Status: AC | PRN
  Administered 2016-01-04: 80 mL via INTRAVENOUS

## 2016-01-04 MED ORDER — SODIUM CHLORIDE 0.9 % IV BOLUS (SEPSIS)
2000.0000 mL | Freq: Once | INTRAVENOUS | Status: AC
  Administered 2016-01-04: 2000 mL via INTRAVENOUS

## 2016-01-04 MED ORDER — INSULIN ASPART 100 UNIT/ML ~~LOC~~ SOLN
0.0000 [IU] | Freq: Three times a day (TID) | SUBCUTANEOUS | Status: DC
  Administered 2016-01-04: 9 [IU] via SUBCUTANEOUS
  Administered 2016-01-05 – 2016-01-06 (×2): 5 [IU] via SUBCUTANEOUS
  Administered 2016-01-06: 3 [IU] via SUBCUTANEOUS

## 2016-01-04 MED ORDER — PANCRELIPASE (LIP-PROT-AMYL) 12000-38000 UNITS PO CPEP
12000.0000 [IU] | ORAL_CAPSULE | Freq: Three times a day (TID) | ORAL | Status: DC
  Administered 2016-01-04 – 2016-01-06 (×7): 12000 [IU] via ORAL
  Filled 2016-01-04 (×7): qty 1

## 2016-01-04 MED ORDER — INSULIN GLARGINE 100 UNIT/ML ~~LOC~~ SOLN
15.0000 [IU] | Freq: Every day | SUBCUTANEOUS | Status: DC
  Administered 2016-01-04 – 2016-01-05 (×2): 15 [IU] via SUBCUTANEOUS
  Filled 2016-01-04 (×3): qty 0.15

## 2016-01-04 MED ORDER — MIRTAZAPINE 15 MG PO TABS
15.0000 mg | ORAL_TABLET | Freq: Every day | ORAL | Status: DC
  Administered 2016-01-04 – 2016-01-05 (×2): 15 mg via ORAL
  Filled 2016-01-04 (×2): qty 1

## 2016-01-04 MED ORDER — NICOTINE 14 MG/24HR TD PT24
14.0000 mg | MEDICATED_PATCH | Freq: Every day | TRANSDERMAL | Status: DC
  Administered 2016-01-04 – 2016-01-06 (×3): 14 mg via TRANSDERMAL
  Filled 2016-01-04 (×3): qty 1

## 2016-01-04 MED ORDER — SODIUM CHLORIDE 0.9 % IV BOLUS (SEPSIS)
1000.0000 mL | INTRAVENOUS | Status: AC
  Administered 2016-01-04: 1000 mL via INTRAVENOUS

## 2016-01-04 MED ORDER — ASPIRIN EC 81 MG PO TBEC
81.0000 mg | DELAYED_RELEASE_TABLET | Freq: Every day | ORAL | Status: DC
  Administered 2016-01-04 – 2016-01-06 (×3): 81 mg via ORAL
  Filled 2016-01-04 (×4): qty 1

## 2016-01-04 MED ORDER — IBUPROFEN 200 MG PO TABS: 600.0000 mg | ORAL_TABLET | Freq: Four times a day (QID) | ORAL | Status: DC | PRN

## 2016-01-04 MED ORDER — INSULIN ASPART 100 UNIT/ML ~~LOC~~ SOLN: 0.0000 [IU] | Freq: Every day | SUBCUTANEOUS | Status: DC

## 2016-01-04 MED ORDER — GABAPENTIN 300 MG PO CAPS
300.0000 mg | ORAL_CAPSULE | Freq: Three times a day (TID) | ORAL | Status: DC
  Administered 2016-01-04 – 2016-01-06 (×7): 300 mg via ORAL
  Filled 2016-01-04 (×7): qty 1

## 2016-01-04 MED ORDER — HYDROCODONE-ACETAMINOPHEN 5-325 MG PO TABS
1.0000 | ORAL_TABLET | Freq: Four times a day (QID) | ORAL | Status: DC | PRN
  Administered 2016-01-04 – 2016-01-06 (×9): 1 via ORAL
  Filled 2016-01-04 (×9): qty 1

## 2016-01-04 MED ORDER — INSULIN ASPART 100 UNIT/ML ~~LOC~~ SOLN
5.0000 [IU] | Freq: Once | SUBCUTANEOUS | Status: AC
  Administered 2016-01-04: 5 [IU] via SUBCUTANEOUS
  Filled 2016-01-04: qty 1

## 2016-01-04 MED ORDER — ENOXAPARIN SODIUM 40 MG/0.4ML ~~LOC~~ SOLN
40.0000 mg | SUBCUTANEOUS | Status: DC
  Administered 2016-01-04 – 2016-01-06 (×3): 40 mg via SUBCUTANEOUS
  Filled 2016-01-04 (×3): qty 0.4

## 2016-01-04 NOTE — ED Notes (Signed)
Back from ct.

## 2016-01-04 NOTE — ED Notes (Signed)
Report given to N11c - nurse [place] rn  [facility] called: pt to be picked up after 7pm

## 2016-01-04 NOTE — ED Notes (Signed)
Pt in Ct  

## 2016-01-04 NOTE — Progress Notes (Signed)
Informed Dr. Alanda SlimGonfa of Critical lactic acid value of 3.3.

## 2016-01-04 NOTE — Consult Note (Signed)
[facility] Nurse wound consult note Reason for Consult: Patient arrived at hospital and admitted with home unit of NPWT device in place.  [place] nurse asked bedside RN to obtain a hospital NPWT unit and to connect; placing home unit into a patient belonging bag and storing in the patient's personal closet until discharge or until a family member could take unit home. Additionally, requested a V.A.C. dressing kit to be brought to patient's bedside so that NPWT dressing could be changed on Monday by [facility] nurse. Wound type:Full thickness Pressure Ulcer POA: No Measurement:Not seen today.  Wound V.A.C. Dressing was last changed on 01/03/16 Wound bed:Not seen today Drainage (amount, consistency, odor) Not observed today Periwound:Not observed today Dressing procedure/placement/frequency: NPWT dressings are to be changed three times a week on M/W/F.  Next dressing change is due on 01/06/16. [place] nursing team will place initial NPWT dressing on Monday, but will likely transition subsequent dressing changes to the bedside RN.  [place] [facility] [place] nurse will remain available to this patient, the nursing and medical teams.   Thanks, Maudie Flakes, MSN, RN, [place], Arther Abbott  Pager# [phone]

## 2016-01-04 NOTE — ED Provider Notes (Signed)
  Face-to-face evaluation   History: He complains of diarrhea for 2 weeks, which actually preceded his surgical procedure, for "cleaning out the left shoulder". He has a wound pump on, and is taking antibiotics.  Physical exam: Alert, elderly appearing man. Left shoulder. No tenderness or swelling anteriorly. Wound VAC applied posteriorly. Abdomen is soft, mild diffuse tenderness.  Medical screening examination/treatment/procedure(s) were conducted as a shared visit with non-physician practitioner(s) and myself.  I personally evaluated the patient during the encounter  Mancel BaleElliott Shatira Dobosz, MD 01/04/16 [phone]

## 2016-01-04 NOTE — ED Notes (Signed)
Called [facility] spoke with Amy charge nurse \ referred to daniel  Told to call back in 5 mins

## 2016-01-04 NOTE — H&P (Signed)
[facility] History and Physical Service Pager: [phone]  Patient name: Raymond Winters Medical record number: [id number] Date of birth: Nov 04, 1960 Age: 55 y.o. Gender: male  Primary Care Provider: No PCP Per Patient Consultants: none Code Status: full  Chief Complaint: diarrhea  Assessment and Plan: Raymond ButteryGary Eppard is a 55 y.o. male presents with chronic diarrhea. PMH is significant for heroin abuse with subsequent deltoid abscess status post I&D, T2DM, HLD.   Chronic Diarrhea: Likely secondary to chronic pancreatitis. She also had laparoscopic cholecystectomy which could be contributing to that. Patient is also on metformin after discharge from recent hospitalization. He was also discharged on Augmentin. However, I doubt if the later one contributed to his diarrhea at this has been going on for 6 months. Unlikely to be infectious process without fever, leukocytosis or acute changes to his diarrhea. He has elevated lactic acid to 3.5 at [facility] which could be due to his metformin. His vital signs where within normal limits except for pulse rate to 102 in ED. Cannot rule out malignancy or inflammatory bowel disease especially with a 30 pound weight loss in the last. He never had colonoscopy. He CRP was 0.8 on July 10 which makes inflammatory process less likely.  He also reports some fresh blood in stool but denies melena. His hemoglobin dropped from 12.3-8 over the course of 2 days during prior admission. However his hemoglobin has been stable since then. -Admitted for observation. Attending Dr. Ginger CarneMcDiarmd -S/p 3L of normal saline bolus in [facility] -Stool for C. Difficile -GI precaution -Repeat lactic acid level -CREON 12,000 international units 3 times a day with meals -FOBT -May need colonoscopy as outpatient  Protein calorie malnutrition: he has a significant weight loss of 30 pounds in the last 1 year suggestive for protein calorie malnutrition  (chronic pancreatitis, chronic alcohol and drug use). -Nutrition consult -TSH  Anemia of chronic disease/leukopenia: Stable. Hemoglobin 8.8 (stable since discharge).  -Consider anemia panel  Left arm wound: Patient with left upper arm abscess from heroin use now status post I&D on 12/25/2015. Discharged home on Augmentin for 7 days on 12/31/2015. Has a wound VAC in place. Wound VAC last changed yesterday. There is no surrounding skin erythema or swelling.  -Wound care consulted -Continue Augmentin for 3 more days  T2DM. On metformin and glipizide at home.  A1c 11.1 (12/26/15). He was hypoglycemic to 370 at [facility] emergency department. Labs are not suggestive for DKA/HHS. He was given 3 L of IV bolus and NovoLog 5 units. Blood glucose down to 240 here.  -SSI-moderate intensity. Can't add basal insulin based on his CBG.  -CBG 4 times a day (ACHS) -Hold metformin in the setting of lactic acidosis and diarrhea -Hold glipizide  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 No documentation of treatment for this. LFTs (AST88 ALT58 tbili 0.4) and alkaline phosphatase 145.  - We will repeat CMP in the morning - Will need follow up with ID (has not seen them since 2014)  Alcohol use disorder: Says his last drink was July 4. -We'll monitor -We'll order CIWA if concern for withdrawal  Tobacco use disorder: 7-pack-year history. Smokes half a pack a day -Nicotine patch  FEN/GI:  -Carb modified diet  Prophylaxis: Lovenox.  Disposition: Admit to MedSurg for observation. Can go home if stable tomorrow  History of Present Illness:  Raymond Winters is a 55 y.o. male presenting with diarrhea.   Patient reports having diarrhea for  6 months. Plan to [facility] because the diarrhea didn't stop. Denies any change from baseline or so later on he says it is more frequent recently,4-5 times a day. He reports having 3-4 bowel movements a day at baseline. He describes the diarrhea as  loose stool, not watery. He states he has to go to bathroom evertime he eats. He also reports some accidents before he gets to the toilet. Reports "reddish looking stuff" in stool but he denies frank blood or dark stool.  Off note, patient was recently hospitalized due to left deltoid abscess secondary to heroin use. His abscess was incised and drained by orthopedics and discharged on Augmentin and wound VAC 12/31/2015. He was discharged on 7 days of Augmentin. He said the wound VAC was changed yesterday.   Patient has history of chronic pancreatitis secondary to alcohol use in the past. He was also recently discharged on metformin for his diabetes.   Denies nausea, vomiting or fever. Reports some chills. Reports some weight loss~30lb over the last 3-4 months. Reports bilateral lower extremity edema. He reports left shoulder pain at the site of surgery. He says he was on Norco when he was discharged.  Drinks about 48 oz 5 days a week. Last drink 7/3 Smokes: 7-pack-year Drugs: Reports heroin use. Last used on 7/4  Review Of Systems: Per HPI  Otherwise the remainder of the systems were negative.  Patient Active Problem List   Diagnosis Date Noted  . Diarrhea 01/04/2016  . Swelling   . Type 2 diabetes mellitus with complication, with long-term current use of insulin (HCC)   . Septic joint of left shoulder region (HCC)   . Abscess 12/24/2015  . Abscess of left shoulder   . Leukocytosis   . Type 2 diabetes mellitus with complication, without long-term current use of insulin (HCC)   . Glenohumeral arthritis 07/24/2015  . Left shoulder pain 07/24/2015  . 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 Tobacco abuse 07/26/2014  . Protein-calorie malnutrition, severe (HCC) 12/05/2013  . Hyperkalemia 12/04/2013  . Heroin withdrawal (HCC) 12/04/2013  . Alcohol abuse 12/04/2013  . Hyperglycemia 12/04/2013  . Metabolic acidosis with respiratory alkalosis 12/04/2013  . Heroin abuse 04/04/2013  . DM2  (diabetes mellitus, type 2) (HCC) 03/20/2013    Past Medical History: Past Medical History  Diagnosis Date  . Heroin abuse   . Type II diabetes mellitus (HCC)   . Depression     Past Surgical History: Past Surgical History  Procedure Laterality Date  . I&d extremity Right 03/20/2013    Procedure: IRRIGATION AND DEBRIDEMENT EXTREMITY;  Surgeon: Marlowe Shores, MD;  Location: [facility] OR;  Service: Orthopedics;  Laterality: Right;  . I&d extremity Left 12/24/2015    Procedure:  DEBRIDEMENT OF MUSCLE, SUBCUTANEOUS TISSUE LEFT SHOULDER;  Surgeon: Tarry Kos, MD;  Location: [facility] OR;  Service: Orthopedics;  Laterality: Left;  . Application of wound vac Left 12/24/2015    Procedure: APPLICATION OF WOUND VAC; LEFT SHOULDER;  Surgeon: Tarry Kos, MD;  Location: [facility] OR;  Service: Orthopedics;  Laterality: Left;  . Irrigation and debridement shoulder Left 12/27/2015    Procedure: IRRIGATION AND DEBRIDEMENT LEFT SHOULDER;  Surgeon: Tarry Kos, MD;  Location: [facility] OR;  Service: Orthopedics;  Laterality: Left;  . Irrigation and debridement shoulder Left 12/30/2015    Procedure: IRRIGATION AND DEBRIDEMENT LEFT SHOULDER;  Surgeon: Tarry Kos, MD;  Location: [facility] OR;  Service: Orthopedics;  Laterality: Left;    Social History: Social History  Substance Use Topics  . Smoking status: Current Every Day Smoker -- 0.50 packs/day for 21 years    Types: Cigarettes  . Smokeless tobacco: Never Used  . Alcohol Use: 16.8 oz/week    28 Cans of beer, 0 Standard drinks or equivalent per week     Comment: 12/25/2015 "~ 2, 16oz beers daily"   Additional social history:  Please also refer to relevant sections of EMR.  Family History: Family History  Problem Relation Age of Onset  . Diabetes Mother   . Heart disease Mother   . Hypertension Mother   . Stroke Mother   . Vision loss Mother   . Heart disease Father   . Hypertension Father     Allergies and Medications: Allergies  Allergen Reactions  . Ativan  [Lorazepam] Other (See Comments)    Causes confusion and hallutations   No current facility-administered medications on file prior to encounter.   Current Outpatient Prescriptions on File Prior to Encounter  Medication Sig Dispense Refill  . amoxicillin-clavulanate (AUGMENTIN) 875-125 MG tablet Take 1 tablet by mouth 2 (two) times daily. 14 tablet 0  . aspirin EC 81 MG tablet Take 1 tablet (81 mg total) by mouth daily. 30 tablet 5  . Cholecalciferol (VITAMIN D-3) 1000 UNITS CAPS Take 1,000 Units by mouth daily.     Marland Kitchen gabapentin (NEURONTIN) 300 MG capsule Take 1 capsule (300 mg total) by mouth at bedtime. For diabetic nerve pain 30 capsule 3  . glipiZIDE (GLUCOTROL) 10 MG tablet Take 10 mg by mouth daily before breakfast.    . HYDROcodone-acetaminophen (NORCO/VICODIN) 5-325 MG tablet Take 1-2 tablets by mouth every 6 (six) hours as needed. (Patient taking differently: Take 1-2 tablets by mouth every 6 (six) hours as needed for severe pain. ) 15 tablet 0  . meloxicam (MOBIC) 15 MG tablet Take 1 tablet (15 mg total) by mouth daily. 30 tablet 3  . metFORMIN (GLUCOPHAGE) 1000 MG tablet Take 1 tablet (1,000 mg total) by mouth 2 (two) times daily with a meal. 60 tablet 4  . mirtazapine (REMERON) 15 MG tablet TAKE 1 TABLET BY MOUTH AT BEDTIME. 30 tablet 2  . Multiple Vitamin (MULTIVITAMIN WITH MINERALS) TABS tablet Take 1 tablet by mouth daily.    . pravastatin (PRAVACHOL) 20 MG tablet Take 1 tablet (20 mg total) by mouth daily. (Patient taking differently: Take 20 mg by mouth at bedtime. ) 30 tablet 4  . ibuprofen (ADVIL,MOTRIN) 800 MG tablet Take 1 tablet (800 mg total) by mouth 3 (three) times daily. (Patient taking differently: Take 800 mg by mouth every 8 (eight) hours as needed. ) 30 tablet 0    Objective: BP 119/81 mmHg  Pulse 63  Temp(Src) 98 F (36.7 C) (Oral)  Resp 18  Ht 5\' 7"  (1.702 m)  Wt 110 lb (49.896 kg)  BMI 17.22 kg/m2  SpO2 100% Exam: GEN: appears wasted, NAD Oropharynx:  clear, moist CVS: regular rate and rythm, normal s1 and s2, no murmurs, 1+ edemato midshin bilaterally RESP: no increased work of breathing, no crackles or wheeze GI: normal bowel sound, soft, mildly tender to deep palpation over left upper quadrant, non-distended. MSK: no tenderness to palpation. Wound VAC in place over his left arm. No surrounding skin erythema or swelling.  NEURO: alert and oriented x4, no gross defecits  PSYCH: Good mood and affect  Labs and Imaging: CBC BMET   Recent Labs Lab 01/04/16 0130  WBC 3.7*  HGB 8.8*  HCT 27.9*  PLT 253  Recent Labs Lab 01/04/16 0130  NA 138  K 4.7  CL 107  CO2 23  BUN 14  CREATININE 0.89  GLUCOSE 354*  CALCIUM 8.1*     Ct Abdomen Pelvis W Contrast  01/04/2016  CLINICAL DATA:  Diarrhea. EXAM: CT ABDOMEN AND PELVIS WITH CONTRAST TECHNIQUE: Multidetector CT imaging of the abdomen and pelvis was performed using the standard protocol following bolus administration of intravenous contrast. CONTRAST:  80mL ISOVUE-300 IOPAMIDOL (ISOVUE-300) INJECTION 61% COMPARISON:  CT scan of September 15, 2015. FINDINGS: Mild bilateral posterior basilar subsegmental atelectasis is noted in the visualized lung bases. No significant osseous abnormality is noted. No gallstones are noted. The liver and spleen appear normal. Calcifications are noted throughout the pancreas with ductal dilatation consistent with chronic pancreatitis. Adrenal glands appear normal. Left kidney appears normal. Mild right hydronephrosis and dilatation of proximal right ureter is noted without definite evidence of obstructing calculus. There is no evidence of bowel obstruction. The appendix appears normal. Urinary bladder appears normal. No significant adenopathy is noted. IMPRESSION: Findings consistent with chronic pancreatitis. Mild right hydronephrosis is noted without obstructing calculus, which is grossly stable compared to prior exam. Electronically Signed   By: Lupita Raider,  M.D.   On: 01/04/2016 08:09   Dg Chest Port 1 View  01/03/2016  CLINICAL DATA:  Initial evaluation for weight loss, diarrhea. EXAM: PORTABLE CHEST 1 VIEW COMPARISON:  Prior radiograph from 12/24/2015. FINDINGS: Cardiac and mediastinal silhouettes are stable in size and contour, and remain within normal limits. Lungs normally inflated. Minimal atelectasis/scarring at the right lung base. No consolidative airspace disease. No pulmonary edema or pleural effusion. No pneumothorax. No acute osseous abnormality. IMPRESSION: 1. Mild atelectasis/scarring at the right lung base. 2. No other active cardiopulmonary disease. Electronically Signed   By: Rise Mu M.D.   On: 01/03/2016 22:51    Almon Hercules, MD 01/04/2016, 11:24 AM PGY-2, [place] [facility] FPTS Intern pager: [phone], text pages welcome

## 2016-01-04 NOTE — ED Notes (Signed)
Writer made unsuccessful attempt to draw labs. Nurse informed.

## 2016-01-04 NOTE — Progress Notes (Signed)
Notified the doctor of critical lactic acid of 3.5.

## 2016-01-04 NOTE — ED Notes (Signed)
Gave report cocco,

## 2016-01-05 ENCOUNTER — Encounter (HOSPITAL_COMMUNITY): Payer: Self-pay | Admitting: Family Medicine

## 2016-01-05 DIAGNOSIS — E46 Unspecified protein-calorie malnutrition: Secondary | ICD-10-CM

## 2016-01-05 DIAGNOSIS — K709 Alcoholic liver disease, unspecified: Secondary | ICD-10-CM

## 2016-01-05 DIAGNOSIS — E872 Acidosis, unspecified: Secondary | ICD-10-CM

## 2016-01-05 DIAGNOSIS — F1110 Opioid abuse, uncomplicated: Secondary | ICD-10-CM

## 2016-01-05 DIAGNOSIS — F1020 Alcohol dependence, uncomplicated: Secondary | ICD-10-CM

## 2016-01-05 DIAGNOSIS — K529 Noninfective gastroenteritis and colitis, unspecified: Secondary | ICD-10-CM

## 2016-01-05 DIAGNOSIS — E8809 Other disorders of plasma-protein metabolism, not elsewhere classified: Secondary | ICD-10-CM | POA: Diagnosis present

## 2016-01-05 DIAGNOSIS — K860 Alcohol-induced chronic pancreatitis: Secondary | ICD-10-CM

## 2016-01-05 HISTORY — DX: Acidosis: E87.2

## 2016-01-05 HISTORY — DX: Alcohol-induced chronic pancreatitis: K86.0

## 2016-01-05 HISTORY — DX: Unspecified protein-calorie malnutrition: E46

## 2016-01-05 HISTORY — DX: Other disorders of plasma-protein metabolism, not elsewhere classified: E88.09

## 2016-01-05 HISTORY — DX: Acidosis, unspecified: E87.20

## 2016-01-05 HISTORY — DX: Alcoholic liver disease, unspecified: K70.9

## 2016-01-05 HISTORY — DX: Alcohol dependence, uncomplicated: F10.20

## 2016-01-05 LAB — GASTROINTESTINAL PANEL BY PCR, STOOL (REPLACES STOOL CULTURE)
ADENOVIRUS F40/41: NOT DETECTED
Astrovirus: NOT DETECTED
CRYPTOSPORIDIUM: NOT DETECTED
Campylobacter species: NOT DETECTED
Cyclospora cayetanensis: NOT DETECTED
E. coli O157: NOT DETECTED
ENTAMOEBA HISTOLYTICA: NOT DETECTED
ENTEROAGGREGATIVE E COLI (EAEC): NOT DETECTED
Enteropathogenic E coli (EPEC): NOT DETECTED
Enterotoxigenic E coli (ETEC): NOT DETECTED
GIARDIA LAMBLIA: NOT DETECTED
Norovirus GI/GII: NOT DETECTED
Plesimonas shigelloides: NOT DETECTED
Rotavirus A: NOT DETECTED
SALMONELLA SPECIES: NOT DETECTED
SHIGELLA/ENTEROINVASIVE E COLI (EIEC): NOT DETECTED
Sapovirus (I, II, IV, and V): NOT DETECTED
Shiga like toxin producing E coli (STEC): NOT DETECTED
VIBRIO CHOLERAE: NOT DETECTED
VIBRIO SPECIES: NOT DETECTED
YERSINIA ENTEROCOLITICA: NOT DETECTED

## 2016-01-05 LAB — GLUCOSE, CAPILLARY
GLUCOSE-CAPILLARY: 39 mg/dL — AB (ref 65–99)
GLUCOSE-CAPILLARY: 265 mg/dL — AB (ref 65–99)
GLUCOSE-CAPILLARY: 89 mg/dL (ref 65–99)
GLUCOSE-CAPILLARY: 168 mg/dL — AB (ref 65–99)
Glucose-Capillary: 118 mg/dL — ABNORMAL HIGH (ref 65–99)

## 2016-01-05 LAB — C DIFFICILE QUICK SCREEN W PCR REFLEX
C DIFFICLE (CDIFF) ANTIGEN: NEGATIVE
C Diff interpretation: NOT DETECTED
C Diff toxin: NEGATIVE

## 2016-01-05 LAB — COMPREHENSIVE METABOLIC PANEL
ALBUMIN: 1.9 g/dL — AB (ref 3.5–5.0)
ALK PHOS: 139 U/L — AB (ref 38–126)
ALT: 59 U/L (ref 17–63)
ANION GAP: 3 — AB (ref 5–15)
AST: 99 U/L — ABNORMAL HIGH (ref 15–41)
BUN: 10 mg/dL (ref 6–20)
CALCIUM: 7.5 mg/dL — AB (ref 8.9–10.3)
CO2: 23 mmol/L (ref 22–32)
CREATININE: 0.7 mg/dL (ref 0.61–1.24)
Chloride: 114 mmol/L — ABNORMAL HIGH (ref 101–111)
GFR calc Af Amer: >60 mL/min (ref >60)
GFR calc non Af Amer: >60 mL/min (ref >60)
GLUCOSE: 60 mg/dL — AB (ref 65–99)
Potassium: 3.7 mmol/L (ref 3.5–5.1)
SODIUM: 140 mmol/L (ref 135–145)
Total Bilirubin: 0.3 mg/dL (ref 0.3–1.2)
Total Protein: 4.9 g/dL — ABNORMAL LOW (ref 6.5–8.1)

## 2016-01-05 LAB — CBC
HCT: 23.7 % — ABNORMAL LOW (ref 39.0–52.0)
HEMOGLOBIN: 7.6 g/dL — AB (ref 13.0–17.0)
MCH: 26 pg (ref 26.0–34.0)
MCHC: 32.1 g/dL (ref 30.0–36.0)
MCV: 81.2 fL (ref 78.0–100.0)
Platelets: 227 10*3/uL (ref 150–400)
RBC: 2.92 MIL/uL — ABNORMAL LOW (ref 4.22–5.81)
RDW: 14.4 % (ref 11.5–15.5)
WBC: 3.6 10*3/uL — ABNORMAL LOW (ref 4.0–10.5)

## 2016-01-05 LAB — URINE CULTURE

## 2016-01-05 LAB — OCCULT BLOOD X 1 CARD TO LAB, STOOL: Fecal Occult Bld: NEGATIVE

## 2016-01-05 MED ORDER — GLUCOSE 40 % PO GEL
ORAL | Status: AC
  Filled 2016-01-05: qty 1

## 2016-01-05 NOTE — Progress Notes (Signed)
Family Medicine Teaching Service Daily Progress Note Intern Pager: [phone]  Patient name: Raymond Winters Medical record number: [id number] Date of birth: 1960/08/25 Age: 55 y.o. Gender: male  Primary Care Provider: No PCP Per Patient Consultants: None Code Status: Full  Pt Overview and Major Events to Date:  7/15 - Admitted with chronic diarrhea  Assessment and Plan: Raymond Winters is a 55 y.o. male presents with chronic diarrhea. PMH is significant for heroin abuse with subsequent deltoid abscess status post I&D, T2DM, HLD.   Chronic Diarrhea: Likely multifactorial in setting of chronic pancreatitis and recently being started on augmentin and metformin earlier this month. No fever or leukocytosis to suggest infectious cause. HIV negative earlier this month, doubt opportunistic infection. Vitals stable. TSH normal. C diff quick scan negative.  - Check GI pathogen panel - Consider checking celiac disease panel - Start CREON for presumed pancreatic insufficiency  - CM consulted for assistance with affording CREON, if not may consider using OTC pancreatic enzymes from [facility] "Super Digestive Enzymes"  Protein calorie malnutrition: he has a significant weight loss of 30 pounds in the last 1 year suggestive for protein calorie malnutrition (chronic pancreatitis, chronic alcohol and drug use). -Nutrition consult  Anemia of chronic disease/leukopenia: Stable. Baseline 10-11. Hgb 7.7 at time of discharge last week. - Trend CBC  Left arm wound: Patient with left upper arm abscess from heroin use now status post I&D on 12/25/2015. Discharged home on Augmentin for 7 days on 12/31/2015. Has a wound VAC in place. Wound VAC last changed yesterday. There is no surrounding skin erythema or swelling.  -Wound care consulted -Continue Augmentin  T2DM. On metformin and glipizide at home. A1c 11.1 (12/26/15). He was hypoglycemic to 370 at [facility] emergency department. Labs are not suggestive for DKA/HHS. He was  given 3 L of IV bolus and NovoLog 5 units. Blood glucose down to 240 here.  -SSI-moderate intensity.  -CBG 4 times a day (ACHS) -Hold metformin in the setting of lactic acidosis and diarrhea -Hold glipizide  Chronic Hepatitis C . Positive HCV quant in 2015. No documentation of treatment for this. LFTs (AST88 ALT58 tbili 0.4) and alkaline phosphatase 145.  - Will need follow up with ID (has not seen them since 2014) - HCV RNA genotyping and liver elastography ordered - HBV surface antibody pending (was negative in 2015) will need vaccination series if negative  Alcohol use disorder: Says his last drink was July 4. -We'll monitor -We'll order CIWA if concern for withdrawal  Tobacco use disorder: 7-pack-year history. Smokes half a pack a day -Nicotine patch  FEN/GI:  -Carb modified diet  Disposition: Admitted pending above work up. Hopeful for discharge home later today.   Subjective:  Diarrhea resolved this morning. Had one formed BM earlier today. No Abdominal pain.   Objective: Temp:  [98 F (36.7 C)-98.7 F (37.1 C)] 98 F (36.7 C) (07/16 0636) Pulse Rate:  [73-83] 83 (07/16 0636) BP: (106-118)/(71-93) 106/71 mmHg (07/16 0636) SpO2:  [99 %-100 %] 100 % (07/16 0636) Physical Exam: General: 55 yo man in NAD, resting in hospital bed Cardiovascular: RRR, no murmurs Respiratory: CTAB, NWOB Abdomen: + BS. S, NT, ND.  Extremities: Wound vac in place on left deltoid, otherwise extremities WWP, no cyanosis or edema.   Laboratory:  Recent Labs Lab 12/30/15 1033 01/04/16 0130 01/05/16 0300  WBC 4.2 3.7* 3.6*  HGB 7.7* 8.8* 7.6*  HCT 23.9* 27.9* 23.7*  PLT 222 253 227    Recent Labs Lab 01/04/16 0130  01/04/16 2042 01/05/16 0300  NA 138 137 140  K 4.7 4.2 3.7  CL 107 112* 114*  CO2 23 20* 23  BUN 14 11 10   CREATININE 0.89 0.86 0.70  CALCIUM 8.1* 7.6* 7.5*  PROT 7.2  --  4.9*  BILITOT 0.4  --  0.3  ALKPHOS 145*  --  139*  ALT 58  --  59  AST 88*  --  99*   GLUCOSE 354* 125* 60*     Recent Labs Lab 01/04/16 1058 01/04/16 1554 01/04/16 2216 01/05/16 0624 01/05/16 0719  GLUCAP 240* 413* 153* 39* 118*   Imaging/Diagnostic Tests: None new.   Ardith Darkaleb M Witt Plitt, MD 01/05/2016, 9:09 AM PGY-3, [place] [facility] FPTS Intern pager: [phone], text pages welcome

## 2016-01-05 NOTE — Progress Notes (Signed)
Patient had a critical cbg value of 39 this morning.  Gave patient Gluco-gel plus snacks.  Patient is alert and oriented.  Will re-check cbg again this morning.

## 2016-01-05 NOTE — Progress Notes (Signed)
Patient's glucose has come up to 118.

## 2016-01-06 ENCOUNTER — Other Ambulatory Visit: Payer: Self-pay | Admitting: Pharmacist

## 2016-01-06 DIAGNOSIS — B182 Chronic viral hepatitis C: Secondary | ICD-10-CM | POA: Insufficient documentation

## 2016-01-06 DIAGNOSIS — E46 Unspecified protein-calorie malnutrition: Secondary | ICD-10-CM

## 2016-01-06 DIAGNOSIS — F1020 Alcohol dependence, uncomplicated: Secondary | ICD-10-CM

## 2016-01-06 LAB — HEPATITIS B SURFACE ANTIBODY, QUANTITATIVE: Hepatitis B-Post: <3.1 m[IU]/mL — ABNORMAL LOW (ref >9.9)

## 2016-01-06 LAB — GLUCOSE, CAPILLARY
GLUCOSE-CAPILLARY: 125 mg/dL — AB (ref 65–99)
GLUCOSE-CAPILLARY: 88 mg/dL (ref 65–99)
Glucose-Capillary: 222 mg/dL — ABNORMAL HIGH (ref 65–99)
Glucose-Capillary: 268 mg/dL — ABNORMAL HIGH (ref 65–99)

## 2016-01-06 MED ORDER — PANCRELIPASE (LIP-PROT-AMYL) 12000-38000 UNITS PO CPEP: 12000.0000 [IU] | ORAL_CAPSULE | Freq: Three times a day (TID) | ORAL | Status: DC

## 2016-01-06 MED ORDER — ENSURE ENLIVE PO LIQD
237.0000 mL | Freq: Two times a day (BID) | ORAL | Status: DC
  Administered 2016-01-06: 237 mL via ORAL

## 2016-01-06 MED FILL — CREON DR 12,000 UNITS CAP: 12000 | 30 days supply | Qty: 90 | Fill #0

## 2016-01-06 NOTE — Progress Notes (Signed)
Pt ready for discharge. Release form faxed over to pt's parol officer Katheren PullerErich Couch. Education/instructions reviewed with pt and all questions/concerns addressed. IVs removed and belongings gathered. Pt will be transported out via wheelchair to sister's car. Will continue to monitor

## 2016-01-06 NOTE — Progress Notes (Signed)
Initial Nutrition Assessment  DOCUMENTATION CODES:   Severe malnutrition in context of acute illness/injury  INTERVENTION:  Provide Ensure Enlive po BID, each supplement provides 350 kcal and 20 grams of protein.  Recommend continuation of nutritional supplementation post discharge.  NUTRITION DIAGNOSIS:   Malnutrition related to acute illness as evidenced by moderate depletion of body fat, moderate depletions of muscle mass, energy intake < or equal to 50% for > or equal to 5 days.  GOAL:   Patient will meet greater than or equal to 90% of their needs  MONITOR:   PO intake, Supplement acceptance, Weight trends, Labs, I & O's, Skin  REASON FOR ASSESSMENT:   Consult Wound healing  ASSESSMENT:   55 y.o. male presents with chronic diarrhea. PMH is significant for heroin abuse with subsequent deltoid abscess status post I&D, T2DM, HLD.   Meal completion has been 100%. Pt reports appetite has improved. Pt however reports he has been having poor po intake for 2 weeks PTA with little to no PO intake at meals due to chronic diarrhea. Pt reports a gradual 30 lbs weight loss over the past 1 year. Weight loss not significant per weight records. Pt reports he consumes Ensure at home whenever he is able to afford it. RD to order Ensure while pt is admitted to aid in caloric and protein needs. Pt educated on continuation of nutritional supplement at home if able. Possible plans for discharge today.   Nutrition-Focused physical exam completed. Findings are moderate fat depletion, moderate muscle depletion, and mild edema.   Labs and medications reviewed.   Diet Order:  Diet Carb Modified Fluid consistency:: Thin; Room service appropriate?: Yes  Skin:  Wound (see comment) (Stage I on sacrum, incision on L arm)  Last BM:  7/16  Height:   Ht Readings from Last 1 Encounters:  01/03/16 5\' 7"  (1.702 m)    Weight:   Wt Readings from Last 1 Encounters:  01/06/16 126 lb 4.8 oz (57.289  kg)    Ideal Body Weight:  67.3 kg  BMI:  Body mass index is 19.78 kg/(m^2).  Estimated Nutritional Needs:   Kcal:  1700-1900  Protein:  85-95 grams  Fluid:  1.7 - 1.9 L/day  EDUCATION NEEDS:   Education needs addressed  Roslyn SmilingStephanie Nichoel Digiulio, MS, RD, LDN Pager # [phone] After hours/ weekend pager # [phone]

## 2016-01-06 NOTE — Progress Notes (Signed)
Family Medicine Teaching Service Daily Progress Note Intern Pager: [phone]  Patient name: Raymond Winters Frieden Medical record number: [id number] Date of birth: May 19, 1961 Age: 55 y.o. Gender: male  Primary Care Provider: No PCP Per Patient Consultants: None Code Status: Full  Pt Overview and Major Events to Date:  7/15 - Admitted with chronic diarrhea  Assessment and Plan: Raymond Winters Tunnell is a 55 y.o. male presents with chronic diarrhea. PMH is significant for heroin abuse with subsequent deltoid abscess status post I&D, T2DM, HLD.   Chronic Diarrhea: Likely multifactorial in setting of chronic pancreatitis and recently being started on augmentin and metformin earlier this month. No fever or leukocytosis to suggest infectious cause. HIV negative earlier this month, doubt opportunistic infection. Vitals stable. TSH normal. C diff quick scan negative. 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 -Nutrition consult  Anemia of chronic disease/leukopenia: Stable. Baseline 10-11. Hgb 7.7 at time of discharge last week. - Trend CBC  Left arm wound: Patient with left upper arm abscess from heroin use now status post I&D on 12/25/2015. Discharged home on Augmentin for 7 days on 12/31/2015. Has a wound VAC in place. Wound VAC last changed yesterday. There is no surrounding skin erythema or swelling.  -Wound care consulted -Continue Augmentin  T2DM. On metformin and glipizide at home. A1c 11.1 (12/26/15). [facility]. Labs are not suggestive for DKA/HHS. He was  given 3 L of IV bolus and NovoLog 5 units. Blood glucose down to 240 here.  -SSI-moderate intensity.  -CBG 4 times a day (ACHS) -Hold metformin in the setting of lactic acidosis and diarrhea -Hold glipizide  Chronic Hepatitis C . Positive HCV quant in 2015. No documentation of treatment for this. LFTs (AST88 ALT58 tbili 0.4) and alkaline phosphatase 145.  - Will need follow up with ID (has not seen them since 2014) - HCV RNA genotyping and liver elastography ordered  - HBV surface antibody pending (was negative in 2015) will need vaccination series if negative, may give first vaccination before discharge - Per ID, will get requested bloodwork for outpatient workup before DC  Alcohol use disorder: Says his last drink was July 4. - monitor - CIWA if concern for withdrawal  Tobacco use disorder: 7-pack-year history. Smokes half a pack a day -Nicotine patch  FEN/GI:  -Carb modified diet  Disposition: Likely home today  Subjective:  States having occasional diarrhea with some formed BMs. States no longer feels weak and feels much improved from admission. Denies fever/chills, abdominal pain, n/v.  Objective: Temp:  [98.1 F (36.7 C)-99.4 F (37.4 C)] 98.1 F (36.7 C) (07/17 0539) Pulse Rate:  [69-75] 69 (07/17 0539) Resp:  [18] 18 (07/16 1556) BP: (103-118)/(68-78) 103/68 mmHg (07/17 0539) SpO2:  [99 %-100 %] 100 % (07/17 0539) Weight:  [129 lb (58.514 kg)] 129 lb (58.514 kg) (07/16 1200)   Physical Exam: General: Sitting up in bed, in NAD, eating ice cream Cardiovascular: RRR, no murmurs Respiratory: CTAB, NWOB Abdomen: + BS. S, NT, ND.  Extremities: Wound vac in place on left deltoid, otherwise extremities WWP, no cyanosis or edema.   Laboratory:  Recent Labs Lab 12/30/15 1033 01/04/16 0130 01/05/16 0300  WBC 4.2 3.7* 3.6*  HGB 7.7* 8.8* 7.6*  HCT 23.9* 27.9* 23.7*  PLT 222 253 227    Recent Labs Lab 01/04/16 0130 01/04/16 2042 01/05/16 0300  NA 138 137  140  K 4.7 4.2 3.7  CL 107 112* 114*  CO2 23 20* 23  BUN CREATININE 0.89 0.86 0.70  CALCIUM 8.1* 7.6* 7.5*  PROT 7.2  --  4.9*  BILITOT 0.4  --  0.3  ALKPHOS 145*  --  139*  ALT 58  --  59  AST 88*  --  99*  GLUCOSE 354* 125* 60*     Recent Labs Lab 01/05/16 1116 01/05/16 1640 01/05/16 2019 01/06/16 0536 01/06/16 0619  GLUCAP 265* 89 168* 125* 88   Imaging/Diagnostic Tests: Ct Abdomen Pelvis W Contrast  01/04/2016  CLINICAL DATA:  Diarrhea. EXAM: CT ABDOMEN AND PELVIS WITH CONTRAST TECHNIQUE: Multidetector CT imaging of the abdomen and pelvis was performed using the standard protocol following bolus administration of intravenous contrast. CONTRAST:  80mL ISOVUE-300 IOPAMIDOL (ISOVUE-300) INJECTION 61% COMPARISON:  CT scan of September 15, 2015. FINDINGS: Mild bilateral posterior basilar subsegmental atelectasis is noted in the visualized lung bases. No significant osseous abnormality is noted. No gallstones are noted. The liver and spleen appear normal. Calcifications are noted throughout the pancreas with ductal dilatation consistent with chronic pancreatitis. Adrenal glands appear normal. Left kidney appears normal. Mild right hydronephrosis and dilatation of proximal right ureter is noted without definite evidence of obstructing calculus. There is no evidence of bowel obstruction. The appendix appears normal. Urinary bladder appears normal. No significant adenopathy is noted. IMPRESSION: Findings consistent with chronic pancreatitis. Mild right hydronephrosis is noted without obstructing calculus, which is grossly stable compared to prior exam. Electronically Signed   By: Lupita Raider, M.D.   On: 01/04/2016 08:09    Leland Her, DO 01/06/2016, 7:04 AM PGY-1, [facility] FPTS Intern pager: [phone], text pages welcome

## 2016-01-06 NOTE — Consult Note (Signed)
WOC Nurse wound consult note Reason for Consult: NPWT VAC dressing change Patient with home VAC, now attached to hospital unit. Home VAC in the room and charging for possible DC today per bedside nurse. Wound type: left shoulder, surgical s/p debridement of the muscle and tissue, skin 12/30/15 Pressure Ulcer POA: No Measurement: aprox. 14cm closed incision with sutures, open wound mid incision that is 3cm x 3.5cm x 0.1cm  Wound bed: open wound, pink, moist with exposed muscle Drainage (amount, consistency, odor) none in canister Periwound: intact  Dressing procedure/placement/frequency: NPWT VAC dressing change.  Covered suture line with oil emulsion non adherent dressing (2pc)  and covered open wound with same.  2pc of black foam used to cover open wound and closed incision.  Seal obtained at 125mmHG.  Pt tolerated well. Patient to be hooked to home unit upon dc to home.  Bedside nurse ok to connect current dressing. Next dressing change this week Wednesday.    Re consult if needed, will not follow at this time. Thanks  Zakiah Beckerman M[employer] MSN, RN,CNS, CWOCN [phone])

## 2016-01-06 NOTE — Progress Notes (Signed)
Spoke with pt's sister Britta MccreedyBarbara regarding [facility] [place] [facility] [place] at [place]. Also spoke with pt's other sister Kary Kosnsonia about getting [facility] at [facility]. Will update MD of family's wishes and continue to monitor

## 2016-01-06 NOTE — Discharge Summary (Signed)
Family Medicine Teaching [facility] Discharge Summary  Patient name: Raymond Winters Medical record number: [id number] Date of birth: 26-Nov-1960 Age: 55 y.o. Gender: male Date of Admission: 01/03/2016  Date of Discharge: 01/06/16 Admitting Physician: Leighton Roachodd D McDiarmid, MD  Primary Care Provider: No PCP Per Patient Consultants: None   Indication for Hospitalization: Chronic diarrhea  Discharge Diagnoses/Problem List:  Chronic diarrhea Protein calorie malnutrition Anemia of chronic disease/leukopenia Left arm wound T2DM Chronic hepatitis C Alcohol use disorder Tobacco use disorder   Disposition: Home  Discharge Condition: Stable  Discharge Exam:  General: Sitting up in bed, in NAD, eating ice cream Cardiovascular: RRR, no murmurs Respiratory: CTAB, NWOB Abdomen: + BS. S, NT, ND.  Extremities: Wound vac in place on left deltoid, otherwise extremities WWP, no cyanosis or edema.   Brief Hospital Course:  Raymond ButteryGary Winters is a 55 y.o. male presents with chronic diarrhea. PMH is significant for heroin abuse with subsequent deltoid abscess status post I&D, T2DM, HLD.   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 He presented with chronic diarrhea over the last 6 months with 4-5 bowel movements a day without fever or leukocytosis. He was placed in isolation precautions until GI pathogen panel and C diff quick scan were negative. 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 Preliminary labs were drawn for his rescheduled appointment before he was discharged this hospital stay.  Issues for Follow Up:  1. Patient should continue on CREON and may need to see GI as an outpatient in the future. 2. Patient will see ID for  his hepatitis C 3. Patient will continue with Orthopedics for his wound care and wound vacuum management.  Significant Procedures: none  Significant Labs and Imaging:   Recent Labs Lab 01/04/16 0130 01/05/16 0300  WBC 3.7* 3.6*  HGB 8.8* 7.6*  HCT 27.9* 23.7*  PLT 253 227    Recent Labs Lab 01/04/16 0130 01/04/16 2042 01/05/16 0300  NA 138 137 140  K 4.7 4.2 3.7  CL 107 112* 114*  CO2 23 20* 23  GLUCOSE 354* 125* 60*  BUN 14 11 10   CREATININE 0.89 0.86 0.70  CALCIUM 8.1* 7.6* 7.5*  ALKPHOS 145*  --  139*  AST 88*  --  99*  ALT 58  --  59  ALBUMIN 2.9*  --  1.9*   INR 1.42 Prothrombin Time 17.5] Hepatitis A Ab Total: positive Hepatitis B Core Ab Total: negative Hepatitis B surface Ag: negative Hepatitis B-post <3.1 HIV nonreactive  Lab Results  Component Value Date   HCVAB Reactive* 12/05/2013    Results/Tests Pending at Time of Discharge: FibroTest, Hep C genotype, HCV RNA quant rflx ultra or genotype  Discharge Medications:    Medication List    TAKE these medications        aspirin EC 81 MG tablet  Take 1 tablet (81 mg total) by mouth daily.     gabapentin 300 MG capsule  Commonly known as:  NEURONTIN  Take 1 capsule (300 mg total) by mouth at bedtime. For diabetic nerve pain     glipiZIDE 10 MG tablet  Commonly known as:  GLUCOTROL  Take 10 mg by mouth daily before breakfast.     HYDROcodone-acetaminophen 5-325 MG tablet  Commonly known as:  NORCO/VICODIN  Take 1-2 tablets by mouth every 6 (six) hours as needed.     ibuprofen 800 MG tablet  Commonly known as:  ADVIL,MOTRIN  Take 1 tablet (800 mg total) by mouth 3 (three) times daily.     lipase/protease/amylase 28413 units Cpep capsule  Commonly known as:  CREON  Take 1 capsule (12,000 Units total) by mouth 3 (three) times daily before meals.     metFORMIN 1000 MG tablet  Commonly known as:  GLUCOPHAGE  Take 1 tablet (1,000 mg total) by mouth 2 (two) times daily with a meal.      mirtazapine 15 MG tablet  Commonly known as:  REMERON  TAKE 1 TABLET BY MOUTH AT BEDTIME.     multivitamin with minerals Tabs tablet  Take 1 tablet by mouth daily.     pravastatin 20 MG tablet  Commonly known as:  PRAVACHOL  Take 1 tablet (20 mg total) by mouth daily.     Vitamin D-3 1000 units Caps  Take 1,000 Units by mouth daily.      ASK your doctor about these medications        amoxicillin-clavulanate 875-125 MG tablet  Commonly known as:  AUGMENTIN  Take 1 tablet by mouth 2 (two) times daily.  Ask about: Should I take this medication?        Discharge Instructions: Please refer to Patient Instructions section of EMR for full details.  Patient was counseled important signs and symptoms that should prompt return to medical care, changes in medications, dietary instructions, activity restrictions, and follow up appointments.   Follow-Up Appointments: Patient had follow up planned for him on last discharge on 12/31/15:   Follow up with Cheral Almas, MD    Specialty: Orthopedic Surgery   Why: For wound re-check. Make appointment for Monday, Wednesday, or Friday   Contact information:   [address] [place] [address] [phone]       Follow up with [facility].   Why: January 20, 2016 at 10 am    Contact information:   [address] [place] [address] [phone]      Follow up with [facility].   Why: home health RN   Contact information:   [address] [place] [address] [phone]          Leland Her, DO 01/07/2016, 10:43 PM PGY-1, [facility]

## 2016-01-07 LAB — HEPATITIS A ANTIBODY, TOTAL: Hep A Total Ab: POSITIVE — AB

## 2016-01-07 LAB — HEPATITIS B CORE ANTIBODY, TOTAL: Hep B Core Total Ab: NEGATIVE

## 2016-01-07 LAB — HEPATITIS B SURFACE ANTIGEN: HEP B S AG: NEGATIVE

## 2016-01-08 LAB — HEPATITIS C GENOTYPE

## 2016-01-08 LAB — HCV RNA QUANT RFLX ULTRA OR GENOTYP
HCV RNA QNT(LOG COPY/ML): 6.942 {Log_IU}/mL
HEPATITIS C QUANTITATION: 8740000 [IU]/mL

## 2016-01-09 LAB — CULTURE, BLOOD (ROUTINE X 2)
CULTURE: NO GROWTH
CULTURE: NO GROWTH

## 2016-01-20 ENCOUNTER — Ambulatory Visit: Payer: Self-pay | Attending: Family Medicine | Admitting: Family Medicine

## 2016-01-20 ENCOUNTER — Encounter: Payer: Self-pay | Admitting: Family Medicine

## 2016-01-20 VITALS — BP 146/82 | HR 93 | Temp 98.5°F | Ht 66.0 in | Wt 115.4 lb

## 2016-01-20 DIAGNOSIS — Z7982 Long term (current) use of aspirin: Secondary | ICD-10-CM | POA: Insufficient documentation

## 2016-01-20 DIAGNOSIS — E119 Type 2 diabetes mellitus without complications: Secondary | ICD-10-CM | POA: Insufficient documentation

## 2016-01-20 DIAGNOSIS — Z79899 Other long term (current) drug therapy: Secondary | ICD-10-CM | POA: Insufficient documentation

## 2016-01-20 DIAGNOSIS — E118 Type 2 diabetes mellitus with unspecified complications: Secondary | ICD-10-CM

## 2016-01-20 DIAGNOSIS — R609 Edema, unspecified: Secondary | ICD-10-CM | POA: Insufficient documentation

## 2016-01-20 DIAGNOSIS — Z794 Long term (current) use of insulin: Secondary | ICD-10-CM | POA: Insufficient documentation

## 2016-01-20 DIAGNOSIS — K860 Alcohol-induced chronic pancreatitis: Secondary | ICD-10-CM | POA: Insufficient documentation

## 2016-01-20 DIAGNOSIS — L02414 Cutaneous abscess of left upper limb: Secondary | ICD-10-CM | POA: Insufficient documentation

## 2016-01-20 DIAGNOSIS — Z791 Long term (current) use of non-steroidal anti-inflammatories (NSAID): Secondary | ICD-10-CM | POA: Insufficient documentation

## 2016-01-20 DIAGNOSIS — D649 Anemia, unspecified: Secondary | ICD-10-CM | POA: Insufficient documentation

## 2016-01-20 DIAGNOSIS — E785 Hyperlipidemia, unspecified: Secondary | ICD-10-CM | POA: Insufficient documentation

## 2016-01-20 DIAGNOSIS — R600 Localized edema: Secondary | ICD-10-CM

## 2016-01-20 DIAGNOSIS — D6489 Other specified anemias: Secondary | ICD-10-CM

## 2016-01-20 LAB — CBC WITH DIFFERENTIAL/PLATELET
BASOS ABS: 46 {cells}/uL (ref 0–200)
BASOS PCT: 1 %
EOS PCT: 2 %
Eosinophils Absolute: 92 cells/uL (ref 15–500)
HCT: 31.5 % — ABNORMAL LOW (ref 38.5–50.0)
HEMOGLOBIN: 10 g/dL — AB (ref 13.2–17.1)
LYMPHS ABS: 1702 {cells}/uL (ref 850–3900)
Lymphocytes Relative: 37 %
MCH: 26.5 pg — AB (ref 27.0–33.0)
MCHC: 31.7 g/dL — ABNORMAL LOW (ref 32.0–36.0)
MCV: 83.6 fL (ref 80.0–100.0)
MONOS PCT: 6 %
MPV: 9.9 fL (ref 7.5–12.5)
Monocytes Absolute: 276 cells/uL (ref 200–950)
NEUTROS ABS: 2484 {cells}/uL (ref 1500–7800)
Neutrophils Relative %: 54 %
PLATELETS: 324 10*3/uL (ref 140–400)
RBC: 3.77 MIL/uL — AB (ref 4.20–5.80)
RDW: 15.5 % — ABNORMAL HIGH (ref 11.0–15.0)
WBC: 4.6 10*3/uL (ref 3.8–10.8)

## 2016-01-20 LAB — GLUCOSE, POCT (MANUAL RESULT ENTRY): POC GLUCOSE: 77 mg/dL (ref 70–99)

## 2016-01-20 MED ORDER — INSULIN GLARGINE 100 UNIT/ML SOLOSTAR PEN: 25.0000 [IU] | PEN_INJECTOR | Freq: Every day | SUBCUTANEOUS | 3 refills | Status: DC

## 2016-01-20 MED ORDER — METFORMIN HCL 1000 MG PO TABS: 1000.0000 mg | ORAL_TABLET | Freq: Two times a day (BID) | ORAL | 3 refills | Status: DC

## 2016-01-20 MED ORDER — PRAVASTATIN SODIUM 20 MG PO TABS: 20.0000 mg | ORAL_TABLET | Freq: Every day | ORAL | 3 refills | Status: DC

## 2016-01-20 MED ORDER — FUROSEMIDE 20 MG PO TABS: 20.0000 mg | ORAL_TABLET | Freq: Every day | ORAL | 0 refills | Status: DC

## 2016-01-20 MED FILL — ?FUROSEMIDE 20 MG TABLET: 20 | 5 days supply | Qty: 5 | Fill #0

## 2016-01-20 NOTE — Progress Notes (Signed)
Subjective:  Patient ID: Raymond Winters, male    DOB: [birth year]  Age: 55 y.o. MRN: [id number]  CC: Diabetes and shoulder pain (left side)   HPI Raymond Winters is a 55 year old male with a history of type 2 diabetes mellitus (A1c 11.1), chronic alcoholic pancreatitis, anemia, hyperlipidemia who presents for a hospital follow-up.  He was hospitalized at [facility] from 01/03/16 through 01/06/16 for chronic diarrhea After previous hospitalization for left shoulder abscess status post I&D, debridement and placement of wound VAC (medical history significant for heroin abuse). C. 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  He has an upcoming appointment with his orthopedic-Dr.Xu in 4 days. Today he complains of pain in the left shoulder and also swelling of his feet but informs me swelling has improved compared to his hospitalization. Denies shortness of breath or chest pain.  Outpatient Medications Prior to Visit  Medication Sig Dispense Refill  . aspirin EC 81 MG tablet Take 1 tablet (81 mg total) by mouth daily. 30 tablet 5  . Cholecalciferol (VITAMIN D-3) 1000 UNITS CAPS Take 1,000 Units by mouth daily.     Marland Kitchen gabapentin (NEURONTIN) 300 MG capsule Take 1 capsule (300 mg total) by mouth at bedtime. For diabetic nerve pain 30 capsule 3  . ibuprofen (ADVIL,MOTRIN) 800 MG tablet Take 1 tablet (800 mg total) by mouth 3 (three) times daily. (Patient taking differently: Take 800 mg by mouth every 8 (eight) hours as needed. ) 30 tablet 0  . lipase/protease/amylase (CREON) 12000 units CPEP capsule Take 1 capsule (12,000 Units total) by mouth 3 (three) times daily before meals. 90 capsule 0  . mirtazapine (REMERON) 15 MG tablet TAKE 1 TABLET BY MOUTH AT BEDTIME. 30 tablet 2  . Multiple Vitamin (MULTIVITAMIN WITH MINERALS) TABS tablet Take 1 tablet by mouth daily.    . metFORMIN (GLUCOPHAGE) 1000 MG tablet Take 1 tablet (1,000 mg total) by mouth 2 (two) times daily  with a meal. 60 tablet 4  . pravastatin (PRAVACHOL) 20 MG tablet Take 1 tablet (20 mg total) by mouth daily. (Patient taking differently: Take 20 mg by mouth at bedtime. ) 30 tablet 4  . glipiZIDE (GLUCOTROL) 10 MG tablet Take 10 mg by mouth daily before breakfast.    . HYDROcodone-acetaminophen (NORCO/VICODIN) 5-325 MG tablet Take 1-2 tablets by mouth every 6 (six) hours as needed. (Patient not taking: Reported on 01/20/2016) 15 tablet 0   No facility-administered medications prior to visit.     ROS Review of Systems  Constitutional: Negative for activity change and appetite change.  HENT: Negative for sinus pressure and sore throat.   Eyes: Negative for visual disturbance.  Respiratory: Negative for cough, chest tightness and shortness of breath.   Cardiovascular: Positive for leg swelling. Negative for chest pain.  Gastrointestinal: Negative for abdominal distention, abdominal pain, constipation and diarrhea.  Endocrine: Negative.   Genitourinary: Negative for dysuria.  Musculoskeletal:       See hpi  Skin: Negative for rash.  Allergic/Immunologic: Negative.   Neurological: Negative for weakness, light-headedness and numbness.  Psychiatric/Behavioral: Negative for dysphoric mood and suicidal ideas.    Objective:  BP (!) 146/82 (BP Location: Right Arm, Patient Position: Sitting, Cuff Size: Small)   Pulse 93   Temp 98.5 F (36.9 C) (Oral)   Ht 5\' 6"  (1.676 m)   Wt 115 lb 6.4 oz (52.3 kg)   SpO2 94%   BMI 18.63 kg/m   BP/Weight 01/20/2016 01/06/2016 12/31/2015  Systolic  BP 146 104 102  Diastolic BP 82 69 60  Wt. (Lbs) 115.4 126.3 -  BMI 18.63 19.78 -      Physical Exam  Constitutional: He is oriented to person, place, and time. He appears well-developed and well-nourished.  Cardiovascular: Normal rate, normal heart sounds and intact distal pulses.   No murmur heard. Pulmonary/Chest: Effort normal and breath sounds normal. He has no wheezes. He has no rales. He exhibits  no tenderness.  Abdominal: Soft. Bowel sounds are normal. He exhibits no distension and no mass. There is no tenderness.  Musculoskeletal:  Wound VAC in left shoulder  Neurological: He is alert and oriented to person, place, and time.     Lab Results  Component Value Date   HGBA1C 11.1 (H) 12/26/2015    Assessment & Plan:   1. Type 2 diabetes mellitus with complication, with long-term current use of insulin (HCC) Uncontrolled with A1c of 11.1 Increase Lantus to 25 units at bedtime - Glucose (CBG) - metFORMIN (GLUCOPHAGE) 1000 MG tablet; Take 1 tablet (1,000 mg total) by mouth 2 (two) times daily with a meal.  Dispense: 60 tablet; Refill: 3 - Insulin Glargine (LANTUS SOLOSTAR) 100 UNIT/ML Solostar Pen; Inject 25 Units into the skin daily at 10 pm.  Dispense: 5 pen; Refill: 3  2. Type 2 diabetes mellitus without complication, with long-term current use of insulin (HCC) We'll review blood sugar log at next visit  3. HLD (hyperlipidemia) Lipid panel next visit - pravastatin (PRAVACHOL) 20 MG tablet; Take 1 tablet (20 mg total) by mouth at bedtime.  Dispense: 30 tablet; Refill: 3  4. Anemia due to other cause Anemia of chronic disease - CBC with Differential/Platelet  5. Abscess of left shoulder Wound VAC in place Keep appointment with orthopedics Patient advised to obtain pain medications from orthopedic  6. Chronic alcoholic pancreatitis (HCC) Stable  7. Pedal edema We'll prescribe Lasix for just 5 days to prevent dehydration.   Meds ordered this encounter  Medications  . metFORMIN (GLUCOPHAGE) 1000 MG tablet    Sig: Take 1 tablet (1,000 mg total) by mouth 2 (two) times daily with a meal.    Dispense:  60 tablet    Refill:  3  . pravastatin (PRAVACHOL) 20 MG tablet    Sig: Take 1 tablet (20 mg total) by mouth at bedtime.    Dispense:  30 tablet    Refill:  3  . furosemide (LASIX) 20 MG tablet    Sig: Take 1 tablet (20 mg total) by mouth daily.    Dispense:  5  tablet    Refill:  0  . Insulin Glargine (LANTUS SOLOSTAR) 100 UNIT/ML Solostar Pen    Sig: Inject 25 Units into the skin daily at 10 pm.    Dispense:  5 pen    Refill:  3    Discontinue previous dose    Follow-up: 2 weeks for follow-up of diabetes mellitus.  Jaclyn Shaggy MD

## 2016-01-21 DIAGNOSIS — S41009A Unspecified open wound of unspecified shoulder, initial encounter: Secondary | ICD-10-CM

## 2016-01-21 HISTORY — DX: Unspecified open wound of unspecified shoulder, initial encounter: S41.009A

## 2016-01-21 MED FILL — !LANTUS SOLOSTAR 100UNITS/M: 100 | 24 days supply | Qty: 6 | Fill #0

## 2016-01-22 ENCOUNTER — Telehealth: Payer: Self-pay

## 2016-01-22 ENCOUNTER — Encounter: Payer: Self-pay | Admitting: Internal Medicine

## 2016-01-22 NOTE — Telephone Encounter (Signed)
Contacted pt to go over lab results pt was unavailable spoke with sister and she is aware of results

## 2016-01-27 ENCOUNTER — Other Ambulatory Visit: Payer: Self-pay | Admitting: Orthopaedic Surgery

## 2016-01-27 ENCOUNTER — Encounter (HOSPITAL_BASED_OUTPATIENT_CLINIC_OR_DEPARTMENT_OTHER): Payer: Self-pay | Admitting: *Deleted

## 2016-01-27 ENCOUNTER — Telehealth: Payer: Self-pay

## 2016-01-27 NOTE — Telephone Encounter (Signed)
Writer called patient per Dr.Amao to inform him of his lab results.  LVM for patient to call back to discuss.

## 2016-01-27 NOTE — Telephone Encounter (Signed)
-----   Message from Jaclyn ShaggyEnobong Amao, MD sent at 01/21/2016  1:48 PM EDT ----- Labs reveal anemia which has improved compared to last visit.

## 2016-01-29 ENCOUNTER — Ambulatory Visit (HOSPITAL_BASED_OUTPATIENT_CLINIC_OR_DEPARTMENT_OTHER): Payer: Self-pay | Admitting: Certified Registered"

## 2016-01-29 ENCOUNTER — Encounter (HOSPITAL_BASED_OUTPATIENT_CLINIC_OR_DEPARTMENT_OTHER)
Admission: RE | Disposition: A | Discharge status: Home / Self Care | Payer: Self-pay | Source: Ambulatory Visit | Attending: Orthopaedic Surgery

## 2016-01-29 ENCOUNTER — Ambulatory Visit (HOSPITAL_BASED_OUTPATIENT_CLINIC_OR_DEPARTMENT_OTHER)
Admission: RE | Admit: 2016-01-29 | Discharge: 2016-01-29 | Disposition: A | Discharge status: Home / Self Care | Payer: Self-pay | Source: Ambulatory Visit | Attending: Orthopaedic Surgery | Admitting: Orthopaedic Surgery

## 2016-01-29 ENCOUNTER — Encounter (HOSPITAL_BASED_OUTPATIENT_CLINIC_OR_DEPARTMENT_OTHER): Payer: Self-pay | Admitting: Certified Registered"

## 2016-01-29 DIAGNOSIS — M25512 Pain in left shoulder: Secondary | ICD-10-CM

## 2016-01-29 DIAGNOSIS — Z794 Long term (current) use of insulin: Secondary | ICD-10-CM | POA: Insufficient documentation

## 2016-01-29 DIAGNOSIS — L02414 Cutaneous abscess of left upper limb: Secondary | ICD-10-CM | POA: Insufficient documentation

## 2016-01-29 DIAGNOSIS — E119 Type 2 diabetes mellitus without complications: Secondary | ICD-10-CM | POA: Insufficient documentation

## 2016-01-29 DIAGNOSIS — Z79899 Other long term (current) drug therapy: Secondary | ICD-10-CM | POA: Insufficient documentation

## 2016-01-29 DIAGNOSIS — F17210 Nicotine dependence, cigarettes, uncomplicated: Secondary | ICD-10-CM | POA: Insufficient documentation

## 2016-01-29 DIAGNOSIS — Z7982 Long term (current) use of aspirin: Secondary | ICD-10-CM | POA: Insufficient documentation

## 2016-01-29 HISTORY — DX: Unspecified open wound of unspecified shoulder, initial encounter: S41.009A

## 2016-01-29 HISTORY — DX: Type 2 diabetes mellitus without complications: E11.9

## 2016-01-29 HISTORY — DX: Pure hypercholesterolemia, unspecified: E78.00

## 2016-01-29 HISTORY — PX: APPLICATION OF WOUND VAC: SHX5189

## 2016-01-29 HISTORY — DX: Family history of other specified conditions: Z84.89

## 2016-01-29 HISTORY — DX: Long term (current) use of insulin: Z79.4

## 2016-01-29 HISTORY — DX: Unspecified osteoarthritis, unspecified site: M19.90

## 2016-01-29 HISTORY — DX: Other amnesia: R41.3

## 2016-01-29 HISTORY — DX: Unspecified viral hepatitis C without hepatic coma: B19.20

## 2016-01-29 HISTORY — PX: SKIN SPLIT GRAFT: SHX444

## 2016-01-29 HISTORY — DX: Reserved for inherently not codable concepts without codable children: IMO0001

## 2016-01-29 LAB — COMPREHENSIVE METABOLIC PANEL
ALT: 34 U/L (ref 17–63)
AST: 51 U/L — AB (ref 15–41)
Albumin: 3.1 g/dL — ABNORMAL LOW (ref 3.5–5.0)
Alkaline Phosphatase: 110 U/L (ref 38–126)
Anion gap: 10 (ref 5–15)
BUN: 13 mg/dL (ref 6–20)
CHLORIDE: 104 mmol/L (ref 101–111)
CO2: 24 mmol/L (ref 22–32)
CREATININE: 1.07 mg/dL (ref 0.61–1.24)
Calcium: 8.8 mg/dL — ABNORMAL LOW (ref 8.9–10.3)
GFR calc Af Amer: >60 mL/min (ref >60)
GFR calc non Af Amer: >60 mL/min (ref >60)
Glucose, Bld: 37 mg/dL — CL (ref 65–99)
Potassium: 4.2 mmol/L (ref 3.5–5.1)
SODIUM: 138 mmol/L (ref 135–145)
Total Bilirubin: 0.4 mg/dL (ref 0.3–1.2)
Total Protein: 7.3 g/dL (ref 6.5–8.1)

## 2016-01-29 LAB — GLUCOSE, CAPILLARY
Glucose-Capillary: 39 mg/dL — CL (ref 65–99)
Glucose-Capillary: 12 mg/dL — CL (ref 65–99)

## 2016-01-29 LAB — POCT I-STAT, CHEM 8
BUN: 12 mg/dL (ref 6–20)
CALCIUM ION: 1.08 mmol/L — AB (ref 1.13–1.30)
CHLORIDE: 104 mmol/L (ref 101–111)
CREATININE: 0.8 mg/dL (ref 0.61–1.24)
Glucose, Bld: 104 mg/dL — ABNORMAL HIGH (ref 65–99)
HCT: 42 % (ref 39.0–52.0)
Hemoglobin: 14.3 g/dL (ref 13.0–17.0)
Potassium: 4.2 mmol/L (ref 3.5–5.1)
SODIUM: 138 mmol/L (ref 135–145)
TCO2: 22 mmol/L (ref 0–100)

## 2016-01-29 SURGERY — APPLICATION, WOUND VAC
Anesthesia: General | Site: Thigh | Laterality: Left

## 2016-01-29 MED ORDER — OXYCODONE HCL 5 MG PO TABS
ORAL_TABLET | ORAL | Status: AC
  Filled 2016-01-29: qty 2

## 2016-01-29 MED ORDER — DEXTROSE 50 % IV SOLN
INTRAVENOUS | Status: AC
  Filled 2016-01-29: qty 50

## 2016-01-29 MED ORDER — LIDOCAINE 2% (20 MG/ML) 5 ML SYRINGE
INTRAMUSCULAR | Status: AC
  Filled 2016-01-29: qty 5

## 2016-01-29 MED ORDER — LACTATED RINGERS IV SOLN
INTRAVENOUS | Status: DC
  Administered 2016-01-29 (×2): via INTRAVENOUS

## 2016-01-29 MED ORDER — DEXAMETHASONE SODIUM PHOSPHATE 10 MG/ML IJ SOLN
INTRAMUSCULAR | Status: AC
  Filled 2016-01-29: qty 1

## 2016-01-29 MED ORDER — DEXTROSE 50 % IV SOLN
12.5000 g | Freq: Once | INTRAVENOUS | Status: AC
  Administered 2016-01-29: 12.5 g via INTRAVENOUS

## 2016-01-29 MED ORDER — OXYCODONE HCL 5 MG PO TABS: 5.0000 mg | ORAL_TABLET | Freq: Once | ORAL | Status: DC

## 2016-01-29 MED ORDER — HYDROCODONE-ACETAMINOPHEN 5-325 MG PO TABS: 1.0000 | ORAL_TABLET | Freq: Four times a day (QID) | ORAL | 0 refills | Status: DC | PRN

## 2016-01-29 MED ORDER — ONDANSETRON HCL 4 MG/2ML IJ SOLN
INTRAMUSCULAR | Status: DC | PRN
  Administered 2016-01-29: 4 mg via INTRAVENOUS

## 2016-01-29 MED ORDER — LIDOCAINE HCL (CARDIAC) 20 MG/ML IV SOLN
INTRAVENOUS | Status: DC | PRN
  Administered 2016-01-29: 100 mg via INTRAVENOUS

## 2016-01-29 MED ORDER — SCOPOLAMINE 1 MG/3DAYS TD PT72: 1.0000 | MEDICATED_PATCH | Freq: Once | TRANSDERMAL | Status: DC | PRN

## 2016-01-29 MED ORDER — PROPOFOL 500 MG/50ML IV EMUL
INTRAVENOUS | Status: AC
  Filled 2016-01-29: qty 50

## 2016-01-29 MED ORDER — PROPOFOL 10 MG/ML IV BOLUS
INTRAVENOUS | Status: DC | PRN
  Administered 2016-01-29: 100 mg via INTRAVENOUS

## 2016-01-29 MED ORDER — SUCCINYLCHOLINE CHLORIDE 200 MG/10ML IV SOSY
PREFILLED_SYRINGE | INTRAVENOUS | Status: AC
  Filled 2016-01-29: qty 10

## 2016-01-29 MED ORDER — SUCCINYLCHOLINE CHLORIDE 20 MG/ML IJ SOLN
INTRAMUSCULAR | Status: DC | PRN
  Administered 2016-01-29: 100 mg via INTRAVENOUS

## 2016-01-29 MED ORDER — ATROPINE SULFATE 0.4 MG/ML IJ SOLN
INTRAMUSCULAR | Status: AC
  Filled 2016-01-29: qty 1

## 2016-01-29 MED ORDER — GLYCOPYRROLATE 0.2 MG/ML IJ SOLN: 0.2000 mg | Freq: Once | INTRAMUSCULAR | Status: DC | PRN

## 2016-01-29 MED ORDER — ONDANSETRON HCL 4 MG/2ML IJ SOLN
INTRAMUSCULAR | Status: AC
  Filled 2016-01-29: qty 2

## 2016-01-29 MED ORDER — CEFAZOLIN SODIUM-DEXTROSE 2-4 GM/100ML-% IV SOLN
INTRAVENOUS | Status: AC
  Filled 2016-01-29: qty 100

## 2016-01-29 MED ORDER — CEFAZOLIN SODIUM-DEXTROSE 2-4 GM/100ML-% IV SOLN
2.0000 g | INTRAVENOUS | Status: AC
  Administered 2016-01-29: 2 g via INTRAVENOUS

## 2016-01-29 MED ORDER — OXYCODONE HCL 5 MG PO TABS
10.0000 mg | ORAL_TABLET | Freq: Once | ORAL | Status: AC
  Administered 2016-01-29: 10 mg via ORAL

## 2016-01-29 MED ORDER — PHENYLEPHRINE 40 MCG/ML (10ML) SYRINGE FOR IV PUSH (FOR BLOOD PRESSURE SUPPORT)
PREFILLED_SYRINGE | INTRAVENOUS | Status: AC
  Filled 2016-01-29: qty 10

## 2016-01-29 MED ORDER — FENTANYL CITRATE (PF) 100 MCG/2ML IJ SOLN
INTRAMUSCULAR | Status: AC
  Filled 2016-01-29: qty 2

## 2016-01-29 MED ORDER — PHENYLEPHRINE HCL 10 MG/ML IJ SOLN
INTRAMUSCULAR | Status: DC | PRN
  Administered 2016-01-29 (×2): 40 ug via INTRAVENOUS

## 2016-01-29 MED ORDER — FENTANYL CITRATE (PF) 100 MCG/2ML IJ SOLN
50.0000 ug | INTRAMUSCULAR | Status: DC | PRN
  Administered 2016-01-29: 50 ug via INTRAVENOUS

## 2016-01-29 MED ORDER — MIDAZOLAM HCL 2 MG/2ML IJ SOLN: 1.0000 mg | INTRAMUSCULAR | Status: DC | PRN

## 2016-01-29 MED FILL — HYDROCODON-APAP 5-325: 5-325 | 5 days supply | Qty: 40 | Fill #0

## 2016-01-29 SURGICAL SUPPLY — 46 items
APL SKNCLS STERI-STRIP NONHPOA (GAUZE/BANDAGES/DRESSINGS)
BENZOIN TINCTURE PRP APPL 2/3 (GAUZE/BANDAGES/DRESSINGS) IMPLANT
BLADE DERMATOME SS (BLADE) ×4 IMPLANT
BLADE SURG 15 STRL LF DISP TIS (BLADE) ×4 IMPLANT
BLADE SURG 15 STRL SS (BLADE) ×8
CLOSURE WOUND 1/2 X4 (GAUZE/BANDAGES/DRESSINGS)
COVER BACK TABLE 60X90IN (DRAPES) ×4 IMPLANT
COVER MAYO STAND STRL (DRAPES) ×4 IMPLANT
DERMACARRIERS GRAFT 1 TO 1.5 (DISPOSABLE) ×4
DRAPE IMP U-DRAPE 54X76 (DRAPES) ×4 IMPLANT
DRAPE INCISE IOBAN 66X45 STRL (DRAPES) ×4 IMPLANT
DRAPE U-SHAPE 47X51 STRL (DRAPES) ×8 IMPLANT
DRAPE U-SHAPE 76X120 STRL (DRAPES) ×8 IMPLANT
DRSG ADAPTIC 3X8 NADH LF (GAUZE/BANDAGES/DRESSINGS) ×6 IMPLANT
DRSG PAD ABDOMINAL 8X10 ST (GAUZE/BANDAGES/DRESSINGS) IMPLANT
DRSG TELFA 3X8 NADH (GAUZE/BANDAGES/DRESSINGS) ×4 IMPLANT
DURAPREP 26ML APPLICATOR (WOUND CARE) ×4 IMPLANT
ELECT REM PT RETURN 9FT ADLT (ELECTROSURGICAL) ×4
ELECTRODE REM PT RTRN 9FT ADLT (ELECTROSURGICAL) ×2 IMPLANT
GAUZE SPONGE 4X4 12PLY STRL (GAUZE/BANDAGES/DRESSINGS) ×4 IMPLANT
GAUZE XEROFORM 5X9 LF (GAUZE/BANDAGES/DRESSINGS) ×2 IMPLANT
GLOVE SKINSENSE NS SZ7.5 (GLOVE) ×2
GLOVE SKINSENSE STRL SZ7.5 (GLOVE) ×2 IMPLANT
GLOVE SURG SYN 7.5  E (GLOVE) ×2
GLOVE SURG SYN 7.5 E (GLOVE) ×2 IMPLANT
GLOVE SURG SYN 7.5 PF PI (GLOVE) ×2 IMPLANT
GOWN PREVENTION PLUS XLARGE (GOWN DISPOSABLE) ×4 IMPLANT
GOWN STRL REUS W/ TWL LRG LVL3 (GOWN DISPOSABLE) ×2 IMPLANT
GOWN STRL REUS W/TWL LRG LVL3 (GOWN DISPOSABLE) ×4
GOWN STRL REUS W/TWL XL LVL3 (GOWN DISPOSABLE) ×4 IMPLANT
GRAFT DERMACARRIERS 1 TO 1.5 (DISPOSABLE) ×1 IMPLANT
PACK BASIN DAY SURGERY FS (CUSTOM PROCEDURE TRAY) ×4 IMPLANT
PAD DRESSING TELFA 3X8 NADH (GAUZE/BANDAGES/DRESSINGS) ×1 IMPLANT
PENCIL BUTTON HOLSTER BLD 10FT (ELECTRODE) ×4 IMPLANT
SLEEVE SCD COMPRESS KNEE MED (MISCELLANEOUS) ×4 IMPLANT
SPONGE LAP 18X18 X RAY DECT (DISPOSABLE) ×8 IMPLANT
STAPLER VISISTAT (STAPLE) ×2 IMPLANT
STRIP CLOSURE SKIN 1/2X4 (GAUZE/BANDAGES/DRESSINGS) IMPLANT
SUT MNCRL AB 4-0 PS2 18 (SUTURE) ×4 IMPLANT
SUT VIC AB 0 CT1 18XCR BRD 8 (SUTURE) IMPLANT
SUT VIC AB 0 CT1 8-18 (SUTURE)
SUT VIC AB 2-0 CT1 27 (SUTURE) ×4
SUT VIC AB 2-0 CT1 TAPERPNT 27 (SUTURE) ×2 IMPLANT
SYR BULB 3OZ (MISCELLANEOUS) ×4 IMPLANT
TOWEL OR 17X24 6PK STRL BLUE (TOWEL DISPOSABLE) ×4 IMPLANT
TOWEL OR NON WOVEN STRL DISP B (DISPOSABLE) ×4 IMPLANT

## 2016-01-29 NOTE — Op Note (Addendum)
   Date of Surgery: 01/29/2016  INDICATIONS: [name] is a 55 y.o.-year-old male with a left shoulder wound from multiple debridements that's ready for skin grafting today;  The patient did consent to the procedure after discussion of the risks and benefits.  PREOPERATIVE DIAGNOSIS: Left shoulder wound 8 x 4 cm  POSTOPERATIVE DIAGNOSIS: Same.  PROCEDURE: 1. Surgical preparation of left shoulder wound for STSG 2. Split thickness skin grafting from left thigh 3. Application of wound vac  SURGEON: N. Glee ArvinMichael Isiac Breighner, M.D.  ASSIST: none.  ANESTHESIA:  general  IV FLUIDS AND URINE: See anesthesia.  ESTIMATED BLOOD LOSS: minimal mL.  IMPLANTS: none  DRAINS: wound vac  COMPLICATIONS: None.  DESCRIPTION OF PROCEDURE: The patient was brought to the operating room and placed lateral on the operating table.  The patient had been signed prior to the procedure and this was documented. The patient had the anesthesia placed by the anesthesiologist.  A time-out was performed to confirm that this was the correct patient, site, side and location. The patient did receive antibiotics prior to the incision and was re-dosed during the procedure as needed at indicated intervals.  A tourniquet not placed.  The patient had the operative extremity prepped and draped in the standard surgical fashion.    We first examined the left shoulder wound which showed red beefy tissue. Surgical preparation of this wound bed was performed using a knife. All surfaces had good bleeding tissue and was viable. I then took a 14/1000th inch skin graft from the left thigh. This was then meshed 1.5-1.  The skin graft was then placed on the shoulder wound and affixed with staples. Epinephrine soaked Telfa was placed on the donor site to help with hemostasis. A wound VAC was then placed on the skin graft with Adaptic. The donor site was dressed with Xeroform Kerlix and Ace. Patient tolerated procedure well and no immediate  complications.  POSTOPERATIVE PLAN: The patient will keep the wound VAC on for 5-7 days after which she'll come to my office for removal.  Mayra ReelN. Michael Hanifa Antonetti, MD [facility] [phone] 9:31 AM

## 2016-01-29 NOTE — Transfer of Care (Signed)
Immediate Anesthesia Transfer of Care Note  Patient: Raymond Winters  Procedure(s) Performed: Procedure(s): LEFT SHOULDER SPLIT THICKNESS SKIN GRAFT, WOUND VAC (Left) MINOR SPLIT THICKNESS SKIN GRAFT (Left)  Patient Location: PACU  Anesthesia Type:General  Level of Consciousness: awake, alert  and patient cooperative  Airway & Oxygen Therapy: Patient Spontanous Breathing and Patient connected to face mask oxygen  Post-op Assessment: Report given to RN, Post -op Vital signs reviewed and stable and Patient moving all extremities  Post vital signs: Reviewed and stable  Last Vitals:  Vitals:   01/29/16 0724  BP: 135/82  Pulse: 93  Resp: 18  Temp: 36.5 C    Last Pain:  Vitals:   01/29/16 0724  TempSrc: Oral  PainSc: 3       Patients Stated Pain Goal: 1 (01/29/16 0724)  Complications: No apparent anesthesia complications

## 2016-01-29 NOTE — Anesthesia Postprocedure Evaluation (Signed)
Anesthesia Post Note  Patient: Raymond Winters  Procedure(s) Performed: Procedure(s) (LRB): LEFT SHOULDER SPLIT THICKNESS SKIN GRAFT, WOUND VAC (Left) MINOR SPLIT THICKNESS SKIN GRAFT (Left)  Patient location during evaluation: PACU Anesthesia Type: General Level of consciousness: awake and alert Pain management: pain level controlled Vital Signs Assessment: post-procedure vital signs reviewed and stable Respiratory status: spontaneous breathing, nonlabored ventilation and respiratory function stable Cardiovascular status: blood pressure returned to baseline and stable Postop Assessment: no signs of nausea or vomiting Anesthetic complications: no    Last Vitals:  Vitals:   01/29/16 0942 01/29/16 1102  BP: (!) 144/92 126/74  Pulse: 90 69  Resp: 14 18  Temp:  36.5 C    Last Pain:  Vitals:   01/29/16 1102  TempSrc:   PainSc: 3                  Davonta Stroot A

## 2016-01-29 NOTE — Anesthesia Preprocedure Evaluation (Signed)
Anesthesia Evaluation  Patient identified by MRN, date of birth, ID band Patient awake    Reviewed: Allergy & Precautions, NPO status , Patient's Chart, lab work & pertinent test results  Airway Mallampati: I  TM Distance: >3 FB Neck ROM: Full    Dental  (+) Teeth Intact, Dental Advisory Given   Pulmonary Current Smoker,    breath sounds clear to auscultation       Cardiovascular  Rhythm:Regular Rate:Normal     Neuro/Psych    GI/Hepatic (+) Hepatitis -, C  Endo/Other  diabetes, Well Controlled, Type 2, Insulin Dependent  Renal/GU      Musculoskeletal   Abdominal   Peds  Hematology   Anesthesia Other Findings   Reproductive/Obstetrics                             Anesthesia Physical Anesthesia Plan  ASA: III  Anesthesia Plan: General   Post-op Pain Management:    Induction: Intravenous  Airway Management Planned: LMA  Additional Equipment:   Intra-op Plan:   Post-operative Plan: Extubation in OR  Informed Consent: I have reviewed the patients History and Physical, chart, labs and discussed the procedure including the risks, benefits and alternatives for the proposed anesthesia with the patient or authorized representative who has indicated his/her understanding and acceptance.   Dental advisory given  Plan Discussed with: CRNA, Anesthesiologist and Surgeon  Anesthesia Plan Comments:         Anesthesia Quick Evaluation

## 2016-01-29 NOTE — Discharge Instructions (Signed)
1. Change left thigh dressing daily with dry dressing and xeroform gauze 2. Do not change wound vac from left shoulder.  Dr. Roda ShuttersXu will remove wound vac in office in 5-7 days     Post Anesthesia Home Care Instructions  Activity: Get plenty of rest for the remainder of the day. A responsible adult should stay with you for 24 hours following the procedure.  For the next 24 hours, DO NOT: -Drive a car -Advertising copywriterperate machinery -Drink alcoholic beverages -Take any medication unless instructed by your physician -Make any legal decisions or sign important papers.  Meals: Start with liquid foods such as gelatin or soup. Progress to regular foods as tolerated. Avoid greasy, spicy, heavy foods. If nausea and/or vomiting occur, drink only clear liquids until the nausea and/or vomiting subsides. Call your physician if vomiting continues.  Special Instructions/Symptoms: Your throat may feel dry or sore from the anesthesia or the breathing tube placed in your throat during surgery. If this causes discomfort, gargle with warm salt water. The discomfort should disappear within 24 hours.  If you had a scopolamine patch placed behind your ear for the management of post- operative nausea and/or vomiting:  1. The medication in the patch is effective for 72 hours, after which it should be removed.  Wrap patch in a tissue and discard in the trash. Wash hands thoroughly with soap and water. 2. You may remove the patch earlier than 72 hours if you experience unpleasant side effects which may include dry mouth, dizziness or visual disturbances. 3. Avoid touching the patch. Wash your hands with soap and water after contact with the patch.

## 2016-01-29 NOTE — Anesthesia Procedure Notes (Addendum)
Procedure Name: Intubation Date/Time: 01/29/2016 8:46 AM Performed by: Curly ShoresRAFT, Kejon Feild W Pre-anesthesia Checklist: Patient identified, Emergency Drugs available, Suction available and Patient being monitored Patient Re-evaluated:Patient Re-evaluated prior to inductionOxygen Delivery Method: Circle system utilized Preoxygenation: Pre-oxygenation with 100% oxygen Intubation Type: IV induction Ventilation: Mask ventilation without difficulty Laryngoscope Size: Miller and 2 Grade View: Grade III Tube type: Oral Tube size: 7.0 mm Number of attempts: 1 Airway Equipment and Method: Stylet Placement Confirmation: ETT inserted through vocal cords under direct vision,  positive ETCO2 and breath sounds checked- equal and bilateral Secured at: 21 cm Tube secured with: Tape Dental Injury: Teeth and Oropharynx as per pre-operative assessment

## 2016-01-29 NOTE — H&P (Signed)
PREOPERATIVE H&P  Chief Complaint: left shoulder wound  HPI: Raymond Winters is a 55 y.o. male who presents for surgical treatment of left shoulder wound.  He denies any changes in medical history.  Past Medical History:  Diagnosis Date  . Alcohol dependence (HCC) 01/05/2016  . Alcoholic liver disease, unspecified (HCC) 01/05/2016  . Arthritis    shoulders  . Chronic alcoholic pancreatitis (HCC) 01/05/2016  . Depression   . Family history of adverse reaction to anesthesia    pt's sister has hx. of post-op N/V  . Hepatitis C   . Heroin abuse   . High cholesterol   . Hypoalbuminemia due to protein-calorie malnutrition (HCC) 01/05/2016  . Insulin dependent diabetes mellitus (HCC)   . Lactic acid increased, CHRONIC 01/05/2016   Secondary to Liver Disease, Alcoholism, and Diabetes  . 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 Shoulder wound 01/2016   left   Past Surgical History:  Procedure Laterality Date  . APPLICATION OF WOUND VAC Left 12/24/2015   Procedure: APPLICATION OF WOUND VAC; LEFT SHOULDER;  Surgeon: Tarry Kos, MD;  Location: [facility] OR;  Service: Orthopedics;  Laterality: Left;  . I&D EXTREMITY Right 03/20/2013   Procedure: IRRIGATION AND DEBRIDEMENT EXTREMITY;  Surgeon: Marlowe Shores, MD;  Location: [facility] OR;  Service: Orthopedics;  Laterality: Right;  . I&D EXTREMITY Left 12/24/2015   Procedure:  DEBRIDEMENT OF MUSCLE, SUBCUTANEOUS TISSUE LEFT SHOULDER;  Surgeon: Tarry Kos, MD;  Location: [facility] OR;  Service: Orthopedics;  Laterality: Left;  . IRRIGATION AND DEBRIDEMENT SHOULDER Left 12/27/2015   Procedure: IRRIGATION AND DEBRIDEMENT LEFT SHOULDER;  Surgeon: Tarry Kos, MD;  Location: [facility] OR;  Service: Orthopedics;  Laterality: Left;  . IRRIGATION AND DEBRIDEMENT SHOULDER Left 12/30/2015   Procedure: IRRIGATION AND DEBRIDEMENT LEFT SHOULDER;  Surgeon: Tarry Kos, MD;  Location: [facility] OR;  Service: Orthopedics;  Laterality: Left;   Social History   Social  History  . Marital status: Single    Spouse name: N/A  . Number of children: N/A  . Years of education: N/A   Social History Main Topics  . Smoking status: Current Every Day Smoker    Packs/day: 0.50    Years: 10.00    Types: Cigarettes  . Smokeless tobacco: Never Used  . Alcohol use No     Comment: last Etoh was 12/20/2015  . Drug use:     Types: IV, Heroin  . Sexual activity: Not Currently   Other Topics Concern  . None   Social History Narrative  . None   Family History  Problem Relation Age of Onset  . Diabetes Mother   . Heart disease Mother   . Hypertension Mother   . Stroke Mother   . Vision loss Mother   . Heart disease Father   . Hypertension Father   . Anesthesia problems Sister     post-op N/V   Allergies  Allergen Reactions  . Ativan [Lorazepam] Other (See Comments)    HALLUCINATIONS   Prior to Admission medications   Medication Sig Start Date End Date Taking? Authorizing Provider  aspirin EC 81 MG tablet Take 1 tablet (81 mg total) by mouth daily. 04/04/14  Yes Ambrose Finland, NP  gabapentin (NEURONTIN) 300 MG capsule Take 1 capsule (300 mg total) by mouth at bedtime. For diabetic nerve pain 06/11/15  Yes Ambrose Finland, NP  Insulin Glargine (LANTUS SOLOSTAR) 100 UNIT/ML Solostar Pen Inject 25 Units into the  skin daily at 10 pm. 01/20/16  Yes Jaclyn ShaggyEnobong Amao, MD  lipase/protease/amylase (CREON) 12000 units CPEP capsule Take 1 capsule (12,000 Units total) by mouth 3 (three) times daily before meals. 01/06/16  Yes Elsia Rodolph BongJ Yoo, DO  meloxicam (MOBIC) 15 MG tablet Take 15 mg by mouth daily.   Yes Historical Provider, MD  metFORMIN (GLUCOPHAGE) 1000 MG tablet Take 1 tablet (1,000 mg total) by mouth 2 (two) times daily with a meal. 01/20/16  Yes Jaclyn ShaggyEnobong Amao, MD  mirtazapine (REMERON) 15 MG tablet TAKE 1 TABLET BY MOUTH AT BEDTIME. 09/27/15  Yes Ambrose FinlandValerie A Keck, NP  pravastatin (PRAVACHOL) 20 MG tablet Take 1 tablet (20 mg total) by mouth at bedtime. 01/20/16  Yes Jaclyn ShaggyEnobong  Amao, MD  glipiZIDE (GLUCOTROL) 10 MG tablet Take 10 mg by mouth daily before breakfast.    Historical Provider, MD  ibuprofen (ADVIL,MOTRIN) 800 MG tablet Take 800 mg by mouth every 8 (eight) hours as needed.    Historical Provider, MD     Positive ROS: All other systems have been reviewed and were otherwise negative with the exception of those mentioned in the HPI and as above.  Physical Exam: General: Alert, no acute distress Cardiovascular: No pedal edema Respiratory: No cyanosis, no use of accessory musculature GI: abdomen soft Skin: No lesions in the area of chief complaint Neurologic: Sensation intact distally Psychiatric: Patient is competent for consent with normal mood and affect Lymphatic: no lymphedema  MUSCULOSKELETAL: exam stable  Assessment: left shoulder wound  Plan: Plan for Procedure(s): LEFT SHOULDER SPLIT THICKNESS SKIN GRAFT, WOUND VAC  The risks benefits and alternatives were discussed with the patient including but not limited to the risks of nonoperative treatment, versus surgical intervention including infection, bleeding, nerve injury,  blood clots, cardiopulmonary complications, morbidity, mortality, among others, and they were willing to proceed.   Cheral AlmasXu, Naiping Michael, MD   01/29/2016 8:17 AM

## 2016-01-29 NOTE — Progress Notes (Signed)
ISTAT blood glucose of 104 noted.

## 2016-01-29 NOTE — Progress Notes (Signed)
Pts [facility] 39 Dr Ivin Bootyrews  Notified, 12.5,gms given IVP per order

## 2016-01-30 ENCOUNTER — Encounter (HOSPITAL_BASED_OUTPATIENT_CLINIC_OR_DEPARTMENT_OTHER): Payer: Self-pay | Admitting: Orthopaedic Surgery

## 2016-01-31 ENCOUNTER — Encounter (HOSPITAL_BASED_OUTPATIENT_CLINIC_OR_DEPARTMENT_OTHER): Payer: Self-pay | Admitting: Orthopaedic Surgery

## 2016-02-03 ENCOUNTER — Ambulatory Visit: Payer: Self-pay | Attending: Family Medicine | Admitting: Family Medicine

## 2016-02-03 ENCOUNTER — Encounter: Payer: Self-pay | Admitting: Family Medicine

## 2016-02-03 VITALS — BP 97/61 | HR 75 | Temp 97.9°F | Ht 67.0 in | Wt 107.2 lb

## 2016-02-03 DIAGNOSIS — F419 Anxiety disorder, unspecified: Secondary | ICD-10-CM

## 2016-02-03 DIAGNOSIS — F329 Major depressive disorder, single episode, unspecified: Secondary | ICD-10-CM | POA: Insufficient documentation

## 2016-02-03 DIAGNOSIS — K860 Alcohol-induced chronic pancreatitis: Secondary | ICD-10-CM | POA: Insufficient documentation

## 2016-02-03 DIAGNOSIS — E785 Hyperlipidemia, unspecified: Secondary | ICD-10-CM | POA: Insufficient documentation

## 2016-02-03 DIAGNOSIS — L02414 Cutaneous abscess of left upper limb: Secondary | ICD-10-CM

## 2016-02-03 DIAGNOSIS — Z794 Long term (current) use of insulin: Secondary | ICD-10-CM

## 2016-02-03 DIAGNOSIS — F1020 Alcohol dependence, uncomplicated: Secondary | ICD-10-CM | POA: Insufficient documentation

## 2016-02-03 DIAGNOSIS — E118 Type 2 diabetes mellitus with unspecified complications: Secondary | ICD-10-CM

## 2016-02-03 DIAGNOSIS — E1165 Type 2 diabetes mellitus with hyperglycemia: Secondary | ICD-10-CM | POA: Insufficient documentation

## 2016-02-03 DIAGNOSIS — D649 Anemia, unspecified: Secondary | ICD-10-CM | POA: Insufficient documentation

## 2016-02-03 DIAGNOSIS — F32A Depression, unspecified: Secondary | ICD-10-CM

## 2016-02-03 LAB — GLUCOSE, CAPILLARY: GLUCOSE-CAPILLARY: 12 mg/dL — AB (ref 65–99)

## 2016-02-03 LAB — GLUCOSE, POCT (MANUAL RESULT ENTRY): POC GLUCOSE: 205 mg/dL — AB (ref 70–99)

## 2016-02-03 MED ORDER — IBUPROFEN 800 MG PO TABS: 800.0000 mg | ORAL_TABLET | Freq: Two times a day (BID) | ORAL | 1 refills | Status: DC | PRN

## 2016-02-03 MED ORDER — MIRTAZAPINE 15 MG PO TABS: 15.0000 mg | ORAL_TABLET | Freq: Every day | ORAL | 2 refills | Status: DC

## 2016-02-03 MED ORDER — TRAMADOL HCL 50 MG PO TABS: 50.0000 mg | ORAL_TABLET | Freq: Three times a day (TID) | ORAL | 0 refills | Status: DC | PRN

## 2016-02-03 MED FILL — traMADol HCL 50 MG TABS: 50 | 5 days supply | Qty: 40 | Fill #0

## 2016-02-03 MED FILL — IBUPROFEN 800 MG TABLET: 800 | 15 days supply | Qty: 30 | Fill #0

## 2016-02-03 MED FILL — MIRTAZAPINE 15 MG TABLET: 15 | 30 days supply | Qty: 30 | Fill #0

## 2016-02-03 NOTE — Progress Notes (Signed)
Subjective:    Patient ID: Raymond Winters, male    DOB: 19-Dec-1960, 55 y.o.   MRN: [id number]  HPI Raymond Winters is a 55 year old male with a history of type 2 diabetes mellitus (A1c 11.1), chronic alcoholic pancreatitis, anemia, hyperlipidemia, Left shoulder abscess (hospitalized for I&D of abscess, debridement and placement of wound VAC in 12/2015)VAC who presents for a  follow-up.  He underwent surgical preparation of left shoulder wound for split thickness skin graft from left thigh by Dr Roda ShuttersXu on 01/29/16 and saw his orthopedic for follow-up today with resulting removal of wound VAC and dressing change. He complains of pain at the site of his left shoulder and has run out of his oxycodone pills which he received from his orthopedics.  He received a few pills of Lasix at his last visit and reports resolution of pedal edema He has no fever, chest pains or shortness of breath.  Past Medical History:  Diagnosis Date  . Alcohol dependence (HCC) 01/05/2016  . Alcoholic liver disease, unspecified (HCC) 01/05/2016  . Arthritis    shoulders  . Chronic alcoholic pancreatitis (HCC) 01/05/2016  . Depression   . Family history of adverse reaction to anesthesia    pt's sister has hx. of post-op N/V  . Hepatitis C   . Heroin abuse   . High cholesterol   . Hypoalbuminemia due to protein-calorie malnutrition (HCC) 01/05/2016  . Insulin dependent diabetes mellitus (HCC)   . Lactic acid increased, CHRONIC 01/05/2016   Secondary to Liver Disease, Alcoholism, and Diabetes  . 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 Shoulder wound 01/2016   left     Review of Systems  Constitutional: Negative for activity change and appetite change.  HENT: Negative for sinus pressure and sore throat.   Eyes: Negative for visual disturbance.  Respiratory: Negative for cough, chest tightness and shortness of breath.   Cardiovascular: Negative for chest pain and leg swelling.  Gastrointestinal: Negative for  abdominal distention, abdominal pain, constipation and diarrhea.  Endocrine: Negative.   Genitourinary: Negative for dysuria.  Musculoskeletal:       See hpi  Skin: Negative for rash.  Allergic/Immunologic: Negative.   Neurological: Negative for weakness, light-headedness and numbness.  Psychiatric/Behavioral: Negative for dysphoric mood and suicidal ideas.       Objective: Vitals:   02/03/16 1429  BP: 97/61  Pulse: 75  Temp: 97.9 F (36.6 C)  TempSrc: Oral  SpO2: 100%  Weight: 107 lb 3.2 oz (48.6 kg)  Height: 5\' 7"  (1.702 m)      Physical Exam  Constitutional: He is oriented to person, place, and time. He appears well-developed and well-nourished.  Cardiovascular: Normal rate, normal heart sounds and intact distal pulses.   No murmur heard. Pulmonary/Chest: Effort normal and breath sounds normal. He has no wheezes. He has no rales. He exhibits no tenderness.  Abdominal: Soft. Bowel sounds are normal. He exhibits no distension and no mass. There is no tenderness.  Musculoskeletal:  Left shoulder dressing in place  Neurological: He is alert and oriented to person, place, and time.          Assessment & Plan:  Uncontrolled type 2 diabetes mellitus A1c of 11.1 Blood sugars show some improvement No regimen changes today. Fasting labs at next visit  Left shoulder abscess: Wound dressing performed to de-air the orthopedic office. Tramadol written for pain He has been advised to obtain subsequent prescription of narcotics from his orthopedic - has an upcoming  appointment next week.  Depression Controlled Refilled Remeron.

## 2016-02-03 NOTE — Progress Notes (Signed)
Medication refill

## 2016-02-03 NOTE — Patient Instructions (Signed)
Diabetes Mellitus and Food It is important for you to manage your blood sugar (glucose) level. Your blood glucose level can be greatly affected by what you eat. Eating healthier foods in the appropriate amounts throughout the day at about the same time each day will help you control your blood glucose level. It can also help slow or prevent worsening of your diabetes mellitus. Healthy eating may even help you improve the level of your blood pressure and reach or maintain a healthy weight.  General recommendations for healthful eating and cooking habits include:  Eating meals and snacks regularly. Avoid going long periods of time without eating to lose weight.  Eating a diet that consists mainly of plant-based foods, such as fruits, vegetables, nuts, legumes, and whole grains.  Using low-heat cooking methods, such as baking, instead of high-heat cooking methods, such as deep frying. Work with your dietitian to make sure you understand how to use the Nutrition Facts information on food labels. HOW CAN FOOD AFFECT ME? Carbohydrates Carbohydrates affect your blood glucose level more than any other type of food. Your dietitian will help you determine how many carbohydrates to eat at each meal and teach you how to count carbohydrates. Counting carbohydrates is important to keep your blood glucose at a healthy level, especially if you are using insulin or taking certain medicines for diabetes mellitus. Alcohol Alcohol can cause sudden decreases in blood glucose (hypoglycemia), especially if you use insulin or take certain medicines for diabetes mellitus. Hypoglycemia can be a life-threatening condition. Symptoms of hypoglycemia (sleepiness, dizziness, and disorientation) are similar to symptoms of having too much alcohol.  If your health care provider has given you approval to drink alcohol, do so in moderation and use the following guidelines:  Women should not have more than one drink per day, and men  should not have more than two drinks per day. One drink is equal to:  12 oz of beer.  5 oz of wine.  1 oz of hard liquor.  Do not drink on an empty stomach.  Keep yourself hydrated. Have water, diet soda, or unsweetened iced tea.  Regular soda, juice, and other mixers might contain a lot of carbohydrates and should be counted. WHAT FOODS ARE NOT RECOMMENDED? As you make food choices, it is important to remember that all foods are not the same. Some foods have fewer nutrients per serving than other foods, even though they might have the same number of calories or carbohydrates. It is difficult to get your body what it needs when you eat foods with fewer nutrients. Examples of foods that you should avoid that are high in calories and carbohydrates but low in nutrients include:  Trans fats (most processed foods list trans fats on the Nutrition Facts label).  Regular soda.  Juice.  Candy.  Sweets, such as cake, pie, doughnuts, and cookies.  Fried foods. WHAT FOODS CAN I EAT? Eat nutrient-rich foods, which will nourish your body and keep you healthy. The food you should eat also will depend on several factors, including:  The calories you need.  The medicines you take.  Your weight.  Your blood glucose level.  Your blood pressure level.  Your cholesterol level. You should eat a variety of foods, including:  Protein.  Lean cuts of meat.  Proteins low in saturated fats, such as fish, egg whites, and beans. Avoid processed meats.  Fruits and vegetables.  Fruits and vegetables that may help control blood glucose levels, such as apples, mangoes, and   yams.  Dairy products.  Choose fat-free or low-fat dairy products, such as milk, yogurt, and cheese.  Grains, bread, pasta, and rice.  Choose whole grain products, such as multigrain bread, whole oats, and brown rice. These foods may help control blood pressure.  Fats.  Foods containing healthful fats, such as nuts,  avocado, olive oil, canola oil, and fish. DOES EVERYONE WITH DIABETES MELLITUS HAVE THE SAME MEAL PLAN? Because every person with diabetes mellitus is different, there is not one meal plan that works for everyone. It is very important that you meet with a dietitian who will help you create a meal plan that is just right for you.   This information is not intended to replace advice given to you by your health care provider. Make sure you discuss any questions you have with your health care provider.   Document Released: 03/05/2005 Document Revised: 06/29/2014 Document Reviewed: 05/05/2013 Elsevier Interactive Patient Education 2016 [employer]  

## 2016-02-11 MED FILL — DOXYCYCLINE 100 MG TABLET: 100 | 10 days supply | Qty: 20 | Fill #0

## 2016-03-03 MED FILL — LANTUS SOLOSTAR 100 UNITS/M: 100 | 24 days supply | Qty: 6 | Fill #1

## 2016-04-30 MED FILL — LANTUS SOLOSTAR 100 UNITS/M: 100 | 24 days supply | Qty: 6 | Fill #2

## 2016-06-17 ENCOUNTER — Telehealth: Payer: Self-pay | Admitting: Family Medicine

## 2016-06-17 MED FILL — !LANTUS SOLOSTAR 100UNITS/M: 100 | 24 days supply | Qty: 6 | Fill #3

## 2016-06-17 NOTE — Telephone Encounter (Signed)
Pt. Sister came into facility stating that she needs a letter from pt. PCP stating that he is not able to make his own decisions. Pt. Sister states she needs this letter to take it to social security so that she can be able to make all his decisions. Pt. Sister Dropped of a letter for his PCP and it will be placed in her box. Please f/u

## 2016-06-18 ENCOUNTER — Telehealth: Payer: Self-pay | Admitting: Family Medicine

## 2016-06-18 ENCOUNTER — Other Ambulatory Visit: Payer: Self-pay | Admitting: Internal Medicine

## 2016-06-18 DIAGNOSIS — E119 Type 2 diabetes mellitus without complications: Secondary | ICD-10-CM

## 2016-06-18 DIAGNOSIS — Z794 Long term (current) use of insulin: Principal | ICD-10-CM

## 2016-06-18 MED FILL — TRUEplus LANCETS 28G MISC: 30 days supply | Qty: 100 | Fill #0

## 2016-06-18 NOTE — Telephone Encounter (Signed)
This will need to be discussed at an office visit with the sister and patient in attendance. His last visit with me was in 01/2016.

## 2016-06-18 NOTE — Telephone Encounter (Signed)
Note given to scheduler to have sister and patient to come in and discuss requested letter by sister.  Per Dr. Venetia NightAmao they both need to see her in clinic with an appt.

## 2016-06-22 ENCOUNTER — Encounter (HOSPITAL_COMMUNITY): Payer: Self-pay | Admitting: *Deleted

## 2016-06-22 ENCOUNTER — Inpatient Hospital Stay (HOSPITAL_COMMUNITY)
Admission: EM | Admit: 2016-06-22 | Discharge: 2016-06-24 | DRG: 918 | Disposition: A | Discharge status: Home / Self Care | Payer: Medicaid Other | Attending: Internal Medicine | Admitting: Internal Medicine

## 2016-06-22 DIAGNOSIS — T383X1A Poisoning by insulin and oral hypoglycemic [antidiabetic] drugs, accidental (unintentional), initial encounter: Principal | ICD-10-CM | POA: Diagnosis present

## 2016-06-22 DIAGNOSIS — E785 Hyperlipidemia, unspecified: Secondary | ICD-10-CM | POA: Diagnosis present

## 2016-06-22 DIAGNOSIS — Y92009 Unspecified place in unspecified non-institutional (private) residence as the place of occurrence of the external cause: Secondary | ICD-10-CM

## 2016-06-22 DIAGNOSIS — E162 Hypoglycemia, unspecified: Secondary | ICD-10-CM

## 2016-06-22 DIAGNOSIS — E1165 Type 2 diabetes mellitus with hyperglycemia: Secondary | ICD-10-CM

## 2016-06-22 DIAGNOSIS — K860 Alcohol-induced chronic pancreatitis: Secondary | ICD-10-CM | POA: Diagnosis present

## 2016-06-22 DIAGNOSIS — Z7982 Long term (current) use of aspirin: Secondary | ICD-10-CM

## 2016-06-22 DIAGNOSIS — F1190 Opioid use, unspecified, uncomplicated: Secondary | ICD-10-CM | POA: Diagnosis present

## 2016-06-22 DIAGNOSIS — E11649 Type 2 diabetes mellitus with hypoglycemia without coma: Secondary | ICD-10-CM | POA: Diagnosis present

## 2016-06-22 DIAGNOSIS — Z888 Allergy status to other drugs, medicaments and biological substances status: Secondary | ICD-10-CM

## 2016-06-22 DIAGNOSIS — B182 Chronic viral hepatitis C: Secondary | ICD-10-CM

## 2016-06-22 DIAGNOSIS — Z720 Tobacco use: Secondary | ICD-10-CM | POA: Diagnosis present

## 2016-06-22 DIAGNOSIS — F17210 Nicotine dependence, cigarettes, uncomplicated: Secondary | ICD-10-CM | POA: Diagnosis present

## 2016-06-22 DIAGNOSIS — E118 Type 2 diabetes mellitus with unspecified complications: Secondary | ICD-10-CM

## 2016-06-22 DIAGNOSIS — Z794 Long term (current) use of insulin: Secondary | ICD-10-CM

## 2016-06-22 DIAGNOSIS — Z833 Family history of diabetes mellitus: Secondary | ICD-10-CM

## 2016-06-22 DIAGNOSIS — E1142 Type 2 diabetes mellitus with diabetic polyneuropathy: Secondary | ICD-10-CM

## 2016-06-22 LAB — COMPREHENSIVE METABOLIC PANEL
ALK PHOS: 77 U/L (ref 38–126)
ALT: 34 U/L (ref 17–63)
ANION GAP: 7 (ref 5–15)
AST: 45 U/L — ABNORMAL HIGH (ref 15–41)
Albumin: 3.9 g/dL (ref 3.5–5.0)
BILIRUBIN TOTAL: 0.5 mg/dL (ref 0.3–1.2)
BUN: 13 mg/dL (ref 6–20)
CALCIUM: 8.9 mg/dL (ref 8.9–10.3)
CO2: 27 mmol/L (ref 22–32)
CREATININE: 0.91 mg/dL (ref 0.61–1.24)
Chloride: 105 mmol/L (ref 101–111)
GFR calc Af Amer: >60 mL/min (ref >60)
Glucose, Bld: 116 mg/dL — ABNORMAL HIGH (ref 65–99)
Potassium: 3.7 mmol/L (ref 3.5–5.1)
Sodium: 139 mmol/L (ref 135–145)
TOTAL PROTEIN: 7.3 g/dL (ref 6.5–8.1)

## 2016-06-22 LAB — CBC WITH DIFFERENTIAL/PLATELET
BASOS ABS: 0 10*3/uL (ref 0.0–0.1)
BASOS PCT: 1 %
EOS ABS: 0.1 10*3/uL (ref 0.0–0.7)
Eosinophils Relative: 3 %
HCT: 32.9 % — ABNORMAL LOW (ref 39.0–52.0)
HEMOGLOBIN: 11 g/dL — AB (ref 13.0–17.0)
Lymphocytes Relative: 31 %
Lymphs Abs: 1.6 10*3/uL (ref 0.7–4.0)
MCH: 26.9 pg (ref 26.0–34.0)
MCHC: 33.4 g/dL (ref 30.0–36.0)
MCV: 80.4 fL (ref 78.0–100.0)
MONOS PCT: 9 %
Monocytes Absolute: 0.4 10*3/uL (ref 0.1–1.0)
NEUTROS PCT: 56 %
Neutro Abs: 2.9 10*3/uL (ref 1.7–7.7)
Platelets: 157 10*3/uL (ref 150–400)
RBC: 4.09 MIL/uL — ABNORMAL LOW (ref 4.22–5.81)
RDW: 14.5 % (ref 11.5–15.5)
WBC: 5.1 10*3/uL (ref 4.0–10.5)

## 2016-06-22 LAB — CBG MONITORING, ED
Glucose-Capillary: 53 mg/dL — ABNORMAL LOW (ref 65–99)
Glucose-Capillary: 74 mg/dL (ref 65–99)
Glucose-Capillary: 102 mg/dL — ABNORMAL HIGH (ref 65–99)
Glucose-Capillary: 184 mg/dL — ABNORMAL HIGH (ref 65–99)
Glucose-Capillary: 104 mg/dL — ABNORMAL HIGH (ref 65–99)
Glucose-Capillary: 92 mg/dL (ref 65–99)

## 2016-06-22 LAB — GLUCOSE, CAPILLARY
GLUCOSE-CAPILLARY: 91 mg/dL (ref 65–99)
Glucose-Capillary: 30 mg/dL — CL (ref 65–99)

## 2016-06-22 MED ORDER — MIRTAZAPINE 30 MG PO TABS
15.0000 mg | ORAL_TABLET | Freq: Every day | ORAL | Status: DC
Start: 2016-06-22 — End: 2016-06-24
  Administered 2016-06-22 – 2016-06-23 (×2): 15 mg via ORAL
  Filled 2016-06-22: qty 1
  Filled 2016-06-22: qty 0.5
  Filled 2016-06-22: qty 1
  Filled 2016-06-22: qty 0.5

## 2016-06-22 MED ORDER — DEXTROSE 50 % IV SOLN: 1.0000 | Freq: Once | INTRAVENOUS | Status: DC

## 2016-06-22 MED ORDER — ONDANSETRON HCL 4 MG/2ML IJ SOLN: 4.0000 mg | Freq: Four times a day (QID) | INTRAMUSCULAR | Status: DC | PRN

## 2016-06-22 MED ORDER — ACETAMINOPHEN 325 MG PO TABS
650.0000 mg | ORAL_TABLET | Freq: Four times a day (QID) | ORAL | Status: DC | PRN
  Administered 2016-06-23 – 2016-06-24 (×2): 650 mg via ORAL
  Filled 2016-06-22 (×2): qty 2

## 2016-06-22 MED ORDER — SODIUM CHLORIDE 0.9 % IV SOLN: 250.0000 mL | INTRAVENOUS | Status: DC | PRN

## 2016-06-22 MED ORDER — INSULIN GLARGINE 100 UNIT/ML ~~LOC~~ SOLN
15.0000 [IU] | Freq: Every day | SUBCUTANEOUS | Status: DC
  Filled 2016-06-22: qty 0.15

## 2016-06-22 MED ORDER — ACETAMINOPHEN 650 MG RE SUPP
650.0000 mg | Freq: Four times a day (QID) | RECTAL | Status: DC | PRN
Start: 2016-06-22 — End: 2016-06-24

## 2016-06-22 MED ORDER — SODIUM CHLORIDE 0.9% FLUSH: 3.0000 mL | INTRAVENOUS | Status: DC | PRN

## 2016-06-22 MED ORDER — ENOXAPARIN SODIUM 40 MG/0.4ML ~~LOC~~ SOLN
40.0000 mg | SUBCUTANEOUS | Status: DC
  Administered 2016-06-22 – 2016-06-23 (×2): 40 mg via SUBCUTANEOUS
  Filled 2016-06-22 (×2): qty 0.4

## 2016-06-22 MED ORDER — NICOTINE 14 MG/24HR TD PT24
14.0000 mg | MEDICATED_PATCH | Freq: Every day | TRANSDERMAL | Status: DC
  Administered 2016-06-22 – 2016-06-24 (×3): 14 mg via TRANSDERMAL
  Filled 2016-06-22 (×3): qty 1

## 2016-06-22 MED ORDER — GABAPENTIN 300 MG PO CAPS
300.0000 mg | ORAL_CAPSULE | Freq: Every day | ORAL | Status: DC
Start: 2016-06-22 — End: 2016-06-24
  Administered 2016-06-22 – 2016-06-23 (×2): 300 mg via ORAL
  Filled 2016-06-22 (×2): qty 1

## 2016-06-22 MED ORDER — INSULIN ASPART 100 UNIT/ML ~~LOC~~ SOLN: 0.0000 [IU] | Freq: Three times a day (TID) | SUBCUTANEOUS | Status: DC

## 2016-06-22 MED ORDER — PANCRELIPASE (LIP-PROT-AMYL) 12000-38000 UNITS PO CPEP
12000.0000 [IU] | ORAL_CAPSULE | Freq: Three times a day (TID) | ORAL | Status: DC
  Administered 2016-06-23 – 2016-06-24 (×5): 12000 [IU] via ORAL
  Filled 2016-06-22 (×4): qty 1

## 2016-06-22 MED ORDER — INSULIN ASPART 100 UNIT/ML ~~LOC~~ SOLN: 0.0000 [IU] | Freq: Every day | SUBCUTANEOUS | Status: DC

## 2016-06-22 MED ORDER — PRAVASTATIN SODIUM 20 MG PO TABS
20.0000 mg | ORAL_TABLET | Freq: Every day | ORAL | Status: DC
  Administered 2016-06-22 – 2016-06-23 (×2): 20 mg via ORAL
  Filled 2016-06-22 (×2): qty 1

## 2016-06-22 MED ORDER — ONDANSETRON HCL 4 MG PO TABS: 4.0000 mg | ORAL_TABLET | Freq: Four times a day (QID) | ORAL | Status: DC | PRN

## 2016-06-22 MED ORDER — SODIUM CHLORIDE 0.9% FLUSH
3.0000 mL | Freq: Two times a day (BID) | INTRAVENOUS | Status: DC
  Administered 2016-06-22 – 2016-06-24 (×4): 3 mL via INTRAVENOUS

## 2016-06-22 NOTE — ED Provider Notes (Signed)
[facility]-EMERGENCY DEPT Provider Note   CSN: [id number] Arrival date & time: 06/22/16  1502     History   Chief Complaint Chief Complaint  Patient presents with  . Hypoglycemia    HPI Raymond Winters is a 56 y.o. male with a hx of alchol abuse and insulin dependent DM. Patient states he has been staying with his sister. His CBG last night was >600 therefore he took his Lantus and then took his sister's Lantus as well. He is unsure of the dose that she is taking. This morning he states he felt anxious and jittery. He checked his blood sugar and it was in the 30s. His sister said he was confused and she called EMS. They came and gave him oral glucose and his blood sugar was raised to the 70s. He did declined transfer to the ED at that time. He ate a big breakfast and they checked his blood sugar again and was found to be in the 30s once again. EMS was called again. They tried to establish IV however were unsuccessful. Begin oral glucose again and transported him to the ED. He states that he has been in his usual state of health. Denies any fever chills shortness of breath, cough, abdominal pain, nausea, vomiting, dysuria. He denies any alcohol use or illegal/illicit drug use. He also states that he is no longer taking any oral hyperglycemic medicines and is only on Lantus at night.   HPI  Past Medical History:  Diagnosis Date  . Alcohol dependence (HCC) 01/05/2016  . Alcoholic liver disease, unspecified 01/05/2016  . Arthritis    shoulders  . Chronic alcoholic pancreatitis (HCC) 01/05/2016  . Depression   . Family history of adverse reaction to anesthesia    pt's sister has hx. of post-op N/V  . Hepatitis C   . Heroin abuse   . High cholesterol   . Hypoalbuminemia due to protein-calorie malnutrition (HCC) 01/05/2016  . Insulin dependent diabetes mellitus (HCC)   . Lactic acid increased, CHRONIC 01/05/2016   Secondary to Liver Disease, Alcoholism, and Diabetes  . 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 Shoulder wound 01/2016   left    Patient Active Problem List   Diagnosis Date Noted  . Depression 02/03/2016  . Chronic hepatitis C (HCC)   . Alcoholic liver disease, unspecified 01/05/2016  . Alcohol dependence (HCC) 01/05/2016  . Chronic alcoholic pancreatitis (HCC) 01/05/2016  . Hypoalbuminemia due to protein-calorie malnutrition (HCC) 01/05/2016  . Lactic acid increased, CHRONIC 01/05/2016  . Diarrhea 01/04/2016  . Chronic diarrhea 01/04/2016  . Pressure ulcer 01/04/2016  . Uncontrolled type 2 diabetes mellitus with complication, with long-term current use of insulin (HCC)   . Swelling   . Type 2 diabetes mellitus with complication, with long-term current use of insulin (HCC)   . Septic joint of left shoulder region (HCC)   . Abscess 12/24/2015  . Abscess of left shoulder   . Leukocytosis   . Type 2 diabetes mellitus with complication, without long-term current use of insulin (HCC)   . Glenohumeral arthritis 07/24/2015  . Left shoulder pain 07/24/2015  . 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 Tobacco abuse 07/26/2014  . Protein-calorie malnutrition, severe (HCC) 12/05/2013  . Hyperkalemia 12/04/2013  . Heroin withdrawal (HCC) 12/04/2013  . Alcohol abuse 12/04/2013  . Hyperglycemia 12/04/2013  . Metabolic acidosis with respiratory alkalosis 12/04/2013  . Heroin abuse 04/04/2013  . DM2 (diabetes mellitus, type 2) (HCC) 03/20/2013    Past  Surgical History:  Procedure Laterality Date  . APPLICATION OF WOUND VAC Left 12/24/2015   Procedure: APPLICATION OF WOUND VAC; LEFT SHOULDER;  Surgeon: Tarry Kos, MD;  Location: [facility];  Service: Orthopedics;  Laterality: Left;  . APPLICATION OF WOUND VAC Left 01/29/2016   Procedure: LEFT SHOULDER SPLIT THICKNESS SKIN GRAFT, WOUND VAC;  Surgeon: Tarry Kos, MD;  Location: [facility];  Service: Orthopedics;  Laterality: Left;  . I&D EXTREMITY Right 03/20/2013   Procedure: IRRIGATION AND  DEBRIDEMENT EXTREMITY;  Surgeon: Marlowe Shores, MD;  Location: [facility];  Service: Orthopedics;  Laterality: Right;  . I&D EXTREMITY Left 12/24/2015   Procedure:  DEBRIDEMENT OF MUSCLE, SUBCUTANEOUS TISSUE LEFT SHOULDER;  Surgeon: Tarry Kos, MD;  Location: [facility];  Service: Orthopedics;  Laterality: Left;  . IRRIGATION AND DEBRIDEMENT SHOULDER Left 12/27/2015   Procedure: IRRIGATION AND DEBRIDEMENT LEFT SHOULDER;  Surgeon: Tarry Kos, MD;  Location: [facility];  Service: Orthopedics;  Laterality: Left;  . IRRIGATION AND DEBRIDEMENT SHOULDER Left 12/30/2015   Procedure: IRRIGATION AND DEBRIDEMENT LEFT SHOULDER;  Surgeon: Tarry Kos, MD;  Location: [facility];  Service: Orthopedics;  Laterality: Left;  . SKIN SPLIT GRAFT Left 01/29/2016   Procedure: SKIN GRAFT SPLIT THICKNESS,;  Surgeon: Tarry Kos, MD;  Location: [facility];  Service: Orthopedics;  Laterality: Left;       Home Medications    Prior to Admission medications   Medication Sig Start Date End Date Taking? Authorizing Provider  aspirin EC 81 MG tablet Take 1 tablet (81 mg total) by mouth daily. 04/04/14   Ambrose Finland, NP  gabapentin (NEURONTIN) 300 MG capsule Take 1 capsule (300 mg total) by mouth at bedtime. For diabetic nerve pain 06/11/15   Ambrose Finland, NP  glipiZIDE (GLUCOTROL) 10 MG tablet Take 10 mg by mouth daily before breakfast.    Historical Provider, MD  HYDROcodone-acetaminophen (NORCO) 5-325 MG tablet Take 1-2 tablets by mouth every 6 (six) hours as needed. Patient not taking: Reported on 02/03/2016 01/29/16   Tarry Kos, MD  ibuprofen (ADVIL,MOTRIN) 800 MG tablet Take 1 tablet (800 mg total) by mouth every 12 (twelve) hours as needed. 02/03/16   Jaclyn Shaggy, MD  Insulin Glargine (LANTUS SOLOSTAR) 100 UNIT/ML Solostar Pen Inject 25 Units into the skin daily at 10 pm. 01/20/16   Jaclyn Shaggy, MD  lipase/protease/amylase (CREON) 12000 units CPEP capsule Take 1 capsule (12,000 Units total) by mouth 3 (three)  times daily before meals. 01/06/16   Elsia Rodolph Bong, DO  meloxicam (MOBIC) 15 MG tablet Take 15 mg by mouth daily.    Historical Provider, MD  metFORMIN (GLUCOPHAGE) 1000 MG tablet Take 1 tablet (1,000 mg total) by mouth 2 (two) times daily with a meal. 01/20/16   Jaclyn Shaggy, MD  mirtazapine (REMERON) 15 MG tablet Take 1 tablet (15 mg total) by mouth at bedtime. 02/03/16   Jaclyn Shaggy, MD  pravastatin (PRAVACHOL) 20 MG tablet Take 1 tablet (20 mg total) by mouth at bedtime. 01/20/16   Jaclyn Shaggy, MD  traMADol (ULTRAM) 50 MG tablet Take 1 tablet (50 mg total) by mouth every 8 (eight) hours as needed. 02/03/16   Jaclyn Shaggy, MD  TRUEPLUS LANCETS 28G MISC USE TO CHECK SUGARS 3 TIMES PER DAY 06/18/16   Jaclyn Shaggy, MD    Family History Family History  Problem Relation Age of Onset  . Diabetes Mother   . Heart disease Mother   .  Hypertension Mother   . Stroke Mother   . Vision loss Mother   . Heart disease Father   . Hypertension Father   . Anesthesia problems Sister     post-op N/V    Social History Social History  Substance Use Topics  . Smoking status: Current Every Day Smoker    Packs/day: 0.50    Years: 10.00    Types: Cigarettes  . Smokeless tobacco: Never Used  . Alcohol use No     Comment: last Etoh was 12/20/2015     Allergies   Ativan [lorazepam]   Review of Systems Review of Systems  Constitutional: Negative for chills and fever.  Respiratory: Negative for cough and shortness of breath.   Gastrointestinal: Negative for abdominal pain, nausea and vomiting.  Endocrine:       Hypoglycemia  Genitourinary: Negative for dysuria.  Allergic/Immunologic: Positive for immunocompromised state.  Neurological: Negative for syncope.  Psychiatric/Behavioral: Positive for confusion. The patient is nervous/anxious (jittery).   All other systems reviewed and are negative.    Physical Exam Updated Vital Signs SpO2 98%   Physical Exam  Constitutional: He is oriented to  person, place, and time. He appears well-developed and well-nourished. No distress.  Well-appearing, conversant, NAD  HENT:  Head: Normocephalic and atraumatic.  Eyes: Conjunctivae are normal. Pupils are equal, round, and reactive to light. Right eye exhibits no discharge. Left eye exhibits no discharge. No scleral icterus.  Neck: Normal range of motion.  Cardiovascular: Normal rate and regular rhythm.  Exam reveals no gallop and no friction rub.   No murmur heard. Pulmonary/Chest: Effort normal and breath sounds normal. No respiratory distress. He has no wheezes. He has no rales. He exhibits no tenderness.  Abdominal: Soft. Bowel sounds are normal. He exhibits no distension and no mass. There is no tenderness. There is no rebound and no guarding. No hernia.  Neurological: He is alert and oriented to person, place, and time.  No confusion  Skin: Skin is warm and dry.  Psychiatric: He has a normal mood and affect. His behavior is normal.  Nursing note and vitals reviewed.    ED Treatments / Results  Labs (all labs ordered are listed, but only abnormal results are displayed) Labs Reviewed  COMPREHENSIVE METABOLIC PANEL - Abnormal; Notable for the following:       Result Value   Glucose, Bld 116 (*)    AST 45 (*)    All other components within normal limits  CBC WITH DIFFERENTIAL/PLATELET - Abnormal; Notable for the following:    RBC 4.09 (*)    Hemoglobin 11.0 (*)    HCT 32.9 (*)    All other components within normal limits  CBG MONITORING, ED - Abnormal; Notable for the following:    Glucose-Capillary 53 (*)    All other components within normal limits  CBG MONITORING, ED - Abnormal; Notable for the following:    Glucose-Capillary 102 (*)    All other components within normal limits  CBG MONITORING, ED - Abnormal; Notable for the following:    Glucose-Capillary 184 (*)    All other components within normal limits  CBG MONITORING, ED    EKG  EKG Interpretation None        Radiology No results found.  Procedures Procedures (including critical care time)  Medications Ordered in ED Medications  dextrose 50 % solution 50 mL (not administered)     Initial Impression / Assessment and Plan / ED Course  I have reviewed the triage  vital signs and the nursing notes.  Pertinent labs & imaging results that were available during my care of the patient were reviewed by me and considered in my medical decision making (see chart for details).  Clinical Course    56 year old male presents with hypoglycemia. On arrival CBG is 3753. He is alert, oriented, not anxious or confused. Conversant. His is mildly hypertensive and bradycardic. Otherwise vitals are normal. CBC remarkable for mild anemia which is at baseline. CMP overall unremarkable. He is tolerating PO - will encourage PO intake. 1amp D50 ordered if needed.   Recheck CBG is 74, 102, 184. Spoke with Dr. Rito EhrlichKrishnan who will admit patient for observation since we are unsure of the total amount of long acting insulin he has taken.  Final Clinical Impressions(s) / ED Diagnoses   Final diagnoses:  Hypoglycemia    New Prescriptions New Prescriptions   No medications on file     Bethel BornKelly Marie Najeh Credit, PA-C 06/22/16 1900    Laurence Spatesachel Morgan Little, MD 06/23/16 1108

## 2016-06-22 NOTE — ED Notes (Signed)
Pt offered orange juice to drink

## 2016-06-22 NOTE — Progress Notes (Addendum)
This RN spoke to the patient's sister who voiced that she believes the patient accidentally took insulin from her pen, Basaglar, instead of his, Lantus. Patient's sister realized this upon noticing the pen was used when it was a new medication that her MD gave for her to try, however, she had not yet used the pen, thus her brother, the patient, must have mixed his pen up with this one this AM. This RN notified floor coverage to update. Will continue to monitor closely.

## 2016-06-22 NOTE — H&P (Signed)
History and Physical  Raymond Winters ZOX:[id number] DOB: September 22, 1960 DOA: 06/22/2016  Referring physician: Terance Hart, ER PA  PCP: Jaclyn Shaggy, MD   Patient coming from: Home & is able to ambulate without assistance  Chief Complaint: Hypoglycemia   HPI: Raymond Winters is a 56 y.o. male with medical history significant of hepatitis C, IV heroin use, chronic pancreatitis and poorly controlled diabetes mellitus on insulin who noted that his sugars were running high on the evening of 12/31. The patient had already taken his own Lantus 25 units around 10 PM. His sugars running high, he decided to take an extra dose and reached into the refrigerator, but instead took his sisters long acting insulin. (The patient is unclear as to what insulin his sister is on and the dosage, but knows that it is a once a day insulin. He thought Levemir might be it.)  Today, the patient felt very much on edge and shaky. When he checked his blood sugar, it was in the 30s. The patient's sister called EMS after she felt like he was confused. Paramedics came and gave the patient oral glucose and his sugar improved to the 70s. Initially he refused transfer to the emergency room. Patient then made big breakfast and when his blood sugars checked again, it was found to be low in the 30s. EMS was called again and patient was brought into the emergency room.  EMS was initially unable to get IV access so he was started on oral glucose tablets.  ED Course: In the emergency room, patient sugar over the next few hours slowly increased from 50s to 70s to 100. Because it was not clear, which insulin the patient had taken, it was felt best to observe the patient overnight and hospitalist for call for further evaluation and admission  Review of Systems: Patient seen in the emergency room . Pt complains of chronic lower extremity numbness   Pt denies any headaches, vision changes, dysphagia, chest pain, palpitations, shortness of breath, wheeze, cough,  abdominal pain, hematuria, dysuria, constipation, diarrhea, focal extremity weakness or pain .  Review of systems are otherwise negative   Past Medical History:  Diagnosis Date  . Alcohol dependence (HCC) 01/05/2016  . Alcoholic liver disease, unspecified 01/05/2016  . Arthritis    shoulders  . Chronic alcoholic pancreatitis (HCC) 01/05/2016  . Depression   . Family history of adverse reaction to anesthesia    pt's sister has hx. of post-op N/V  . Hepatitis C   . Heroin abuse   . High cholesterol   . Hypoalbuminemia due to protein-calorie malnutrition (HCC) 01/05/2016  . Insulin dependent diabetes mellitus (HCC)   . Lactic acid increased, CHRONIC 01/05/2016   Secondary to Liver Disease, Alcoholism, and Diabetes  . 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 Shoulder wound 01/2016   left   Past Surgical History:  Procedure Laterality Date  . APPLICATION OF WOUND VAC Left 12/24/2015   Procedure: APPLICATION OF WOUND VAC; LEFT SHOULDER;  Surgeon: Tarry Kos, MD;  Location: [facility];  Service: Orthopedics;  Laterality: Left;  . APPLICATION OF WOUND VAC Left 01/29/2016   Procedure: LEFT SHOULDER SPLIT THICKNESS SKIN GRAFT, WOUND VAC;  Surgeon: Tarry Kos, MD;  Location: [facility];  Service: Orthopedics;  Laterality: Left;  . I&D EXTREMITY Right 03/20/2013   Procedure: IRRIGATION AND DEBRIDEMENT EXTREMITY;  Surgeon: Marlowe Shores, MD;  Location: [facility];  Service: Orthopedics;  Laterality: Right;  . I&D  EXTREMITY Left 12/24/2015   Procedure:  DEBRIDEMENT OF MUSCLE, SUBCUTANEOUS TISSUE LEFT SHOULDER;  Surgeon: Tarry Kos, MD;  Location: [facility];  Service: Orthopedics;  Laterality: Left;  . IRRIGATION AND DEBRIDEMENT SHOULDER Left 12/27/2015   Procedure: IRRIGATION AND DEBRIDEMENT LEFT SHOULDER;  Surgeon: Tarry Kos, MD;  Location: [facility];  Service: Orthopedics;  Laterality: Left;  . IRRIGATION AND DEBRIDEMENT SHOULDER Left 12/30/2015   Procedure: IRRIGATION  AND DEBRIDEMENT LEFT SHOULDER;  Surgeon: Tarry Kos, MD;  Location: [facility];  Service: Orthopedics;  Laterality: Left;  . SKIN SPLIT GRAFT Left 01/29/2016   Procedure: SKIN GRAFT SPLIT THICKNESS,;  Surgeon: Tarry Kos, MD;  Location: [facility];  Service: Orthopedics;  Laterality: Left;    Social History:  reports that he has been smoking Cigarettes.  He has a 5.00 pack-year smoking history. He has never used smokeless tobacco. He reports that he uses drugs, including IV and Heroin. He reports that he does not drink alcohol. Patient lives with his sister and is normally able to ambulate without assistance   Allergies  Allergen Reactions  . Ativan [Lorazepam] Other (See Comments)    HALLUCINATIONS    Family History  Problem Relation Age of Onset  . Diabetes Mother   . Heart disease Mother   . Hypertension Mother   . Stroke Mother   . Vision loss Mother   . Heart disease Father   . Hypertension Father   . Anesthesia problems Sister     post-op N/V     Prior to Admission medications   Medication Sig Start Date End Date Taking? Authorizing Provider  aspirin EC 81 MG tablet Take 1 tablet (81 mg total) by mouth daily. 04/04/14  Yes Ambrose Finland, NP  gabapentin (NEURONTIN) 300 MG capsule Take 1 capsule (300 mg total) by mouth at bedtime. For diabetic nerve pain 06/11/15  Yes Ambrose Finland, NP  ibuprofen (ADVIL,MOTRIN) 800 MG tablet Take 1 tablet (800 mg total) by mouth every 12 (twelve) hours as needed. Patient taking differently: Take 800 mg by mouth every 12 (twelve) hours as needed for moderate pain.  02/03/16  Yes Jaclyn Shaggy, MD  Insulin Glargine (LANTUS SOLOSTAR) 100 UNIT/ML Solostar Pen Inject 25 Units into the skin daily at 10 pm. Patient taking differently: Inject 25 Units into the skin daily.  01/20/16  Yes Jaclyn Shaggy, MD  lipase/protease/amylase (CREON) 12000 units CPEP capsule Take 1 capsule (12,000 Units total) by mouth 3 (three) times daily before  meals. Patient taking differently: Take 12,000 Units by mouth 3 (three) times daily as needed (stomach ulcer/coating).  01/06/16  Yes Elsia Rodolph Bong, DO  meloxicam (MOBIC) 15 MG tablet Take 15 mg by mouth daily.   Yes Historical Provider, MD  metFORMIN (GLUCOPHAGE) 1000 MG tablet Take 1 tablet (1,000 mg total) by mouth 2 (two) times daily with a meal. 01/20/16  Yes Jaclyn Shaggy, MD  mirtazapine (REMERON) 15 MG tablet Take 1 tablet (15 mg total) by mouth at bedtime. 02/03/16  Yes Jaclyn Shaggy, MD  Multiple Vitamins-Minerals (MULTIVITAMIN WITH MINERALS) tablet Take 1 tablet by mouth daily.   Yes Historical Provider, MD  pravastatin (PRAVACHOL) 20 MG tablet Take 1 tablet (20 mg total) by mouth at bedtime. 01/20/16  Yes Jaclyn Shaggy, MD  TRUEPLUS LANCETS 28G MISC USE TO CHECK SUGARS 3 TIMES PER DAY 06/18/16  Yes Jaclyn Shaggy, MD    Physical Exam: BP 140/89 (BP Location: Right Arm)   Pulse 81  Temp 97.3 F (36.3 C) (Oral)   Resp 18   SpO2 100%   General:  Alert and oriented 3, no acute distress  Eyes: Sclera nonicteric, excellent ocular movements are intact  ENT: Normocephalic, atraumatic mucous membranes are slightly dry  Neck: No JVD  Cardiovascular: Regular rate and rhythm, S1-S2  Respiratory: Clear to auscultation bilaterally  Abdomen: Soft, nontender, nondistended, positive bowel sounds  Skin: Dry skin otherwise, no skin breaks tears or lesions  Musculoskeletal: No clubbing or cyanosis or edema  Psychiatric: Patient is appropriate, no evidence of psychoses  Neurologic: No focal deficits           Labs on Admission:  Basic Metabolic Panel:  Recent Labs Lab 06/22/16 1543  NA 139  K 3.7  CL 105  CO2 27  GLUCOSE 116*  BUN 13  CREATININE 0.91  CALCIUM 8.9   Liver Function Tests:  Recent Labs Lab 06/22/16 1543  AST 45*  ALT 34  ALKPHOS 77  BILITOT 0.5  PROT 7.3  ALBUMIN 3.9   No results for input(s): LIPASE, AMYLASE in the last 168 hours. No results for input(s):  AMMONIA in the last 168 hours. CBC:  Recent Labs Lab 06/22/16 1543  WBC 5.1  NEUTROABS 2.9  HGB 11.0*  HCT 32.9*  MCV 80.4  PLT 157   Cardiac Enzymes: No results for input(s): CKTOTAL, CKMB, CKMBINDEX, TROPONINI in the last 168 hours.  BNP (last 3 results) No results for input(s): BNP in the last 8760 hours.  ProBNP (last 3 results) No results for input(s): PROBNP in the last 8760 hours.  CBG:  Recent Labs Lab 06/22/16 1519 06/22/16 1603 06/22/16 1643 06/22/16 1749 06/22/16 1904  GLUCAP 53* 74 102* 184* 104*    Radiological Exams on Admission: No results found.  EKG: not done  Assessment/Plan Present on Admission: . Hypoglycemia . Tobacco abuse . 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 It is safe to give him his Lantus although based off of unknown amount of sisters long-term insulin, we will give him less than full dose of his own Lantus tonight: 15 units instead of 25 units.  He'll be able to resume normal dosing following. Meantime, monitor CBGs and will provide sensitive sliding scale. Also check A1c as last one was 11.1 back in July  Active Problems:   Tobacco abuse: Patient states he is trying to cut back. Provided nicotine patch.  IV heroin use: Counseled    Chronic hepatitis C (HCC): Followed by PCP  DVT prophylaxis: Lovenox   Code Status: Full code   Family Communication: Sister not present. Patient told me that he would keep his sister informed   Disposition Plan: Anticipate discharge tomorrow   Consults called: None   Admission status: Patient is anticipated to be discharged in the morning, therefore we'll place him  under observation status     Hollice EspyKRISHNAN,Ewen Varnell K MD [facility] Pager [phone]- [phone]  If 7PM-7AM, please contact night-coverage [email or web address] Password [email or web address]  06/22/2016, 7:15 PM

## 2016-06-22 NOTE — ED Triage Notes (Signed)
Per EMS: pt from home with c/o hypoglycemia. EMS reports pt was seen by them  earlier today and treated with D10 for cbg of 34. Per EMS pt reported he had a meal after that, however his blood glucose dropped again. EMS reports on their arrival cbg was 34 again. Pt sts last night his CBG was 600. EMS unable to establish PIV, pt was treated with oral glucose. Pt A+Ox4 on arrival. Ambulatory to bathroom

## 2016-06-22 NOTE — ED Notes (Signed)
Bed: WA09 Expected date: 06/22/16 Expected time: 3:05 PM Means of arrival: Ambulance Comments: HOLD RM 9 Hypoglycemia

## 2016-06-22 NOTE — ED Notes (Addendum)
CBG- 92 

## 2016-06-22 NOTE — ED Notes (Signed)
Report attempted to be called was told that nurse was in pt room and she would call back for report.

## 2016-06-23 DIAGNOSIS — E162 Hypoglycemia, unspecified: Secondary | ICD-10-CM | POA: Diagnosis not present

## 2016-06-23 DIAGNOSIS — E1142 Type 2 diabetes mellitus with diabetic polyneuropathy: Secondary | ICD-10-CM | POA: Diagnosis present

## 2016-06-23 DIAGNOSIS — B182 Chronic viral hepatitis C: Secondary | ICD-10-CM | POA: Diagnosis present

## 2016-06-23 DIAGNOSIS — Z794 Long term (current) use of insulin: Secondary | ICD-10-CM | POA: Diagnosis not present

## 2016-06-23 DIAGNOSIS — F17210 Nicotine dependence, cigarettes, uncomplicated: Secondary | ICD-10-CM | POA: Diagnosis present

## 2016-06-23 DIAGNOSIS — Z888 Allergy status to other drugs, medicaments and biological substances status: Secondary | ICD-10-CM | POA: Diagnosis not present

## 2016-06-23 DIAGNOSIS — Y92009 Unspecified place in unspecified non-institutional (private) residence as the place of occurrence of the external cause: Secondary | ICD-10-CM | POA: Diagnosis not present

## 2016-06-23 DIAGNOSIS — K860 Alcohol-induced chronic pancreatitis: Secondary | ICD-10-CM | POA: Diagnosis present

## 2016-06-23 DIAGNOSIS — T383X1A Poisoning by insulin and oral hypoglycemic [antidiabetic] drugs, accidental (unintentional), initial encounter: Secondary | ICD-10-CM | POA: Diagnosis not present

## 2016-06-23 DIAGNOSIS — F1190 Opioid use, unspecified, uncomplicated: Secondary | ICD-10-CM | POA: Diagnosis present

## 2016-06-23 DIAGNOSIS — Z833 Family history of diabetes mellitus: Secondary | ICD-10-CM | POA: Diagnosis not present

## 2016-06-23 DIAGNOSIS — Z7982 Long term (current) use of aspirin: Secondary | ICD-10-CM | POA: Diagnosis not present

## 2016-06-23 DIAGNOSIS — E785 Hyperlipidemia, unspecified: Secondary | ICD-10-CM | POA: Diagnosis present

## 2016-06-23 DIAGNOSIS — E11649 Type 2 diabetes mellitus with hypoglycemia without coma: Secondary | ICD-10-CM | POA: Diagnosis present

## 2016-06-23 LAB — GLUCOSE, CAPILLARY
GLUCOSE-CAPILLARY: 336 mg/dL — AB (ref 65–99)
GLUCOSE-CAPILLARY: 54 mg/dL — AB (ref 65–99)
GLUCOSE-CAPILLARY: 66 mg/dL (ref 65–99)
Glucose-Capillary: 115 mg/dL — ABNORMAL HIGH (ref 65–99)
Glucose-Capillary: 12 mg/dL — CL (ref 65–99)
Glucose-Capillary: 138 mg/dL — ABNORMAL HIGH (ref 65–99)
Glucose-Capillary: 178 mg/dL — ABNORMAL HIGH (ref 65–99)
Glucose-Capillary: 326 mg/dL — ABNORMAL HIGH (ref 65–99)
Glucose-Capillary: 81 mg/dL (ref 65–99)

## 2016-06-23 MED ORDER — INSULIN ASPART 100 UNIT/ML ~~LOC~~ SOLN
0.0000 [IU] | SUBCUTANEOUS | Status: DC
  Administered 2016-06-23 – 2016-06-24 (×3): 7 [IU] via SUBCUTANEOUS

## 2016-06-23 MED ORDER — DEXTROSE 50 % IV SOLN
INTRAVENOUS | Status: AC
  Administered 2016-06-23: 08:00:00
  Filled 2016-06-23: qty 50

## 2016-06-23 MED ORDER — INSULIN GLARGINE 100 UNIT/ML ~~LOC~~ SOLN
20.0000 [IU] | Freq: Every day | SUBCUTANEOUS | Status: DC
  Administered 2016-06-23: 20 [IU] via SUBCUTANEOUS
  Filled 2016-06-23 (×2): qty 0.2

## 2016-06-23 NOTE — Progress Notes (Signed)
Inpatient Diabetes Program Recommendations  AACE/ADA: New Consensus Statement on Inpatient Glycemic Control (2015)  Target Ranges:  Prepandial:   less than 140 mg/dL      Peak postprandial:   less than 180 mg/dL (1-2 hours)      Critically ill patients:  140 - 180 mg/dL   Lab Results  Component Value Date   GLUCAP 336 (H) 06/23/2016   HGBA1C 11.1 (H) 12/26/2015    Review of Glycemic Control  Diabetes history: DM2 Outpatient Diabetes medications: Lantus 25 units QHS,  Current orders for Inpatient glycemic control: Lantus 20 units QHS, Novolog 0-9 units Q4H  Spoke with pt at length regarding his insulin regimen. Pt states his blood sugar was > 600 mg/dL on 16/1012/31 and he went to refrigerator to take extra Lantus and mistakenly grabbed his sister's Basaglar pen. Pt stated he took 15 units. On 06/22/2016, he was extremely hypoglycemic and EMS was called. [facility]. Checks blood sugars several times/day at home. HgbA1C indicates poor control at home.  Inpatient Diabetes Program Recommendations:    Lantus 20 units QHS Change Novolog to sensitive tidwc and hs Will likely benefit from meal coverage insulin. Novolog 3 units tidwc. Change diet to CHO mod med. [facility] with blood sugar log for addition of rapid-acting insulin and/or adjustment to basal insulin.  Discussed with MD and RN. Will f/u in am.  Thank you. Ailene Ardshonda Naasir Carreira, RD, LDN, CDE Inpatient Diabetes Coordinator [phone]

## 2016-06-23 NOTE — Progress Notes (Signed)
Hypoglycemic Event  CBG: 12  Treatment: 1 amp D50, as well as breakfast with juice  Symptoms: Lightheadedness  Follow-up CBG: Time: 0830 CBG Result: 160   Comments/MD notified: Notified Dr. Rito EhrlichKrishnan, ordered D50, and to recheck in 1 hour.    Raymond Winters, Raymond Winters

## 2016-06-23 NOTE — Progress Notes (Addendum)
[facility] PROGRESS NOTE  Raymond Winters ZOX:[id number] DOB: 1960-11-01 DOA: 06/22/2016   PCP: Jaclyn Shaggy, MD     Assessment/Plan: Principal Problem:   Hypoglycemia Active Problems:   Tobacco abuse   Type 2 diabetes mellitus with complication, with long-term current use of insulin (HCC)   Chronic hepatitis C (HCC)  Raymond Winters is a 56 y.o. male with medical history significant of hepatitis C, IV heroin use, chronic pancreatitis and poorly controlled diabetes mellitus on insulin who noted that his sugars were running high on the evening of 12/31. The patient had already taken his own Lantus 25 units around 10 PM. His sugars running high, he decided to take an extra dose and reached into the refrigerator, but instead took his sisters long acting insulin. When he checked his blood sugar, it was in the 30s. The patient's sister called EMS after she felt like he was confused. Paramedics came and gave the patient oral glucose and his sugar improved to the 70s. Initially he refused transfer to the emergency room. Patient then made big breakfast and when his blood sugars checked again, it was found to be low in the 30s. 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 Provided nicotine patch.  IV heroin use: Counseled   Chronic hepatitis C (HCC): Followed by PCP  Dyslipidemia  Continue statin    DVT prophylaxsis lovenox   Code Status:  Full code      Family Communication:  Discussed in detail with the patient, all imaging results, lab results explained to the patient   Disposition Plan:  In am       Consultants:  None   Procedures:  None   Antibiotics: Anti-infectives    None         HPI/Subjective: Does not have any sx during hypoglycemic episodes even with cbg of 12 , understands that he needs monitoring for one more day  Objective: Vitals:   06/22/16 1538 06/22/16 1905 06/22/16 2000 06/23/16 0300  BP: 155/100 140/89 (!) 144/91 120/80  Pulse: (!) 53 81 84 94  Resp: 16 18 18 18   Temp: 97.3 F (36.3 C)  98 F (36.7 C) 98.8 F (37.1 C)  TempSrc: Oral  Oral Oral  SpO2: 100% 100% 100% 100%  Weight:    63.4 kg (139 lb 12.4 oz)  Height:    5\' 7"  (1.702 m)    Intake/Output Summary (Last 24 hours) at 06/23/16 1053 Last data filed at 06/23/16 0800  Gross per 24 hour  Intake              360 ml  Output              400 ml  Net              -40 ml    Exam:  Examination:  General exam: Appears calm and comfortable  Respiratory system: Clear to auscultation. Respiratory effort normal. Cardiovascular system: S1 & S2  heard, RRR. No JVD, murmurs, rubs, gallops or clicks. No pedal edema. Gastrointestinal system: Abdomen is nondistended, soft and nontender. No organomegaly or masses felt. Normal bowel sounds heard. Central nervous system: Alert and oriented. No focal neurological deficits. Extremities: Symmetric 5 x 5 power. Skin: No rashes, lesions or ulcers Psychiatry: Judgement and insight appear normal. Mood & affect appropriate.     Data Reviewed: I have personally reviewed following labs and imaging studies  Micro Results No results found for this or any previous visit (from the past 240 hour(s)).  Radiology Reports No results found.   CBC  Recent Labs Lab 06/22/16 1543  WBC 5.1  HGB 11.0*  HCT 32.9*  PLT 157  MCV 80.4  MCH 26.9  MCHC 33.4  RDW 14.5  LYMPHSABS 1.6  MONOABS 0.4  EOSABS 0.1  BASOSABS 0.0     Chemistries   Recent Labs Lab 06/22/16 1543  NA 139  K 3.7  CL 105  CO2 27  GLUCOSE 116*  BUN 13  CREATININE 0.91  CALCIUM 8.9  AST 45*  ALT 34  ALKPHOS 77  BILITOT 0.5   ------------------------------------------------------------------------------------------------------------------ estimated creatinine clearance is 80.3 mL/min (by C-G formula based on SCr of 0.91 mg/dL). ------------------------------------------------------------------------------------------------------------------ No results for input(s): HGBA1C in the last 72 hours. ------------------------------------------------------------------------------------------------------------------ No results for input(s): CHOL, HDL, LDLCALC, TRIG, CHOLHDL, LDLDIRECT in the last 72 hours. ------------------------------------------------------------------------------------------------------------------ No results for input(s): TSH, T4TOTAL, T3FREE, THYROIDAB in the last 72 hours.  Invalid input(s): FREET3 ------------------------------------------------------------------------------------------------------------------ No results for input(s): VITAMINB12, FOLATE, FERRITIN, TIBC, IRON, RETICCTPCT in the last 72 hours.  Coagulation profile No results for input(s): INR, PROTIME in the last 168 hours.  No results for input(s): DDIMER in the last 72 hours.  Cardiac Enzymes No results for input(s): CKMB, TROPONINI, MYOGLOBIN in the last 168 hours.  Invalid input(s): CK ------------------------------------------------------------------------------------------------------------------ Invalid input(s): POCBNP   CBG:  Recent Labs Lab 06/23/16 0050 06/23/16 0235 06/23/16 0437 06/23/16 0729 06/23/16 0824  GLUCAP 54* 81 66 12* 178*       Studies: No results found.    Lab Results  Component Value Date   HGBA1C 11.1 (H) 12/26/2015   HGBA1C 12.70 07/15/2015   HGBA1C 10.20 05/10/2015   Lab Results   Component Value Date   MICROALBUR 2.9 (H) 04/04/2014   LDLCALC 16 01/03/2014   CREATININE 0.91 06/22/2016       Scheduled Meds: . dextrose  1 ampule Intravenous Once  . enoxaparin (LOVENOX) injection  40 mg Subcutaneous Q24H  . gabapentin  300 mg Oral QHS  . lipase/protease/amylase  12,000 Units Oral TID AC  . mirtazapine  15 mg Oral QHS  . nicotine  14 mg Transdermal Daily  . pravastatin  20 mg Oral QHS  . sodium chloride flush  3 mL Intravenous Q12H   Continuous Infusions:   LOS: 0 days    Time spent: >30 MINS    [facility],Ivet Guerrieri  [facility] Pager [phone]. If 7PM-7AM, please contact night-coverage at [email or web address], password [email or web address] 06/23/2016, 10:53 AM  LOS: 0 days

## 2016-06-24 ENCOUNTER — Ambulatory Visit: Payer: Self-pay | Admitting: Family Medicine

## 2016-06-24 ENCOUNTER — Telehealth: Payer: Self-pay | Admitting: Family Medicine

## 2016-06-24 ENCOUNTER — Other Ambulatory Visit: Payer: Self-pay | Admitting: Internal Medicine

## 2016-06-24 DIAGNOSIS — K860 Alcohol-induced chronic pancreatitis: Secondary | ICD-10-CM

## 2016-06-24 DIAGNOSIS — E1142 Type 2 diabetes mellitus with diabetic polyneuropathy: Secondary | ICD-10-CM

## 2016-06-24 DIAGNOSIS — E118 Type 2 diabetes mellitus with unspecified complications: Secondary | ICD-10-CM

## 2016-06-24 LAB — COMPREHENSIVE METABOLIC PANEL
ALBUMIN: 3.1 g/dL — AB (ref 3.5–5.0)
ALK PHOS: 64 U/L (ref 38–126)
ALT: 28 U/L (ref 17–63)
AST: 42 U/L — ABNORMAL HIGH (ref 15–41)
Anion gap: 6 (ref 5–15)
BUN: 16 mg/dL (ref 6–20)
CALCIUM: 8.5 mg/dL — AB (ref 8.9–10.3)
CO2: 25 mmol/L (ref 22–32)
CREATININE: 1.03 mg/dL (ref 0.61–1.24)
Chloride: 108 mmol/L (ref 101–111)
GFR calc Af Amer: >60 mL/min (ref >60)
GFR calc non Af Amer: >60 mL/min (ref >60)
GLUCOSE: 216 mg/dL — AB (ref 65–99)
Potassium: 4.3 mmol/L (ref 3.5–5.1)
SODIUM: 139 mmol/L (ref 135–145)
Total Bilirubin: 0.4 mg/dL (ref 0.3–1.2)
Total Protein: 5.9 g/dL — ABNORMAL LOW (ref 6.5–8.1)

## 2016-06-24 LAB — GLUCOSE, CAPILLARY
GLUCOSE-CAPILLARY: 67 mg/dL (ref 65–99)
GLUCOSE-CAPILLARY: 81 mg/dL (ref 65–99)
Glucose-Capillary: 55 mg/dL — ABNORMAL LOW (ref 65–99)
Glucose-Capillary: 201 mg/dL — ABNORMAL HIGH (ref 65–99)
Glucose-Capillary: 319 mg/dL — ABNORMAL HIGH (ref 65–99)

## 2016-06-24 LAB — HEMOGLOBIN A1C
HEMOGLOBIN A1C: 7.8 % — AB (ref 4.8–5.6)
MEAN PLASMA GLUCOSE: 177 mg/dL

## 2016-06-24 MED ORDER — NICOTINE 14 MG/24HR TD PT24: 14.0000 mg | MEDICATED_PATCH | Freq: Every day | TRANSDERMAL | 0 refills | Status: DC

## 2016-06-24 MED FILL — GABAPENTIN 300 MG CAPSULE: 300 | 30 days supply | Qty: 30 | Fill #0

## 2016-06-24 MED FILL — PRAVASTATIN NA 20 MG TAB: 20 | 30 days supply | Qty: 30 | Fill #0

## 2016-06-24 NOTE — Discharge Summary (Signed)
Physician Discharge Summary  Larinda ButteryGary Marrs ZOX:[id number] DOB: 07/20/1960 DOA: 06/22/2016  PCP: Jaclyn ShaggyEnobong, Amao, MD  Admit date: 06/22/2016 Discharge date: 06/24/2016  Admitted From:Home Disposition: Home  Recommendations for Outpatient Follow-up:  1. Follow up with PCP in 1-2 weeks. Patient instructed to take  bedtime snack to avoid early morning hypoglycemia.   Home Health: None Equipment/Devices: None  Discharge Condition: Stable CODE STATUS:Full code Diet recommendation: Diabetic    Discharge Diagnoses:  Principal Problem:   Hypoglycemia   Active Problems:   Tobacco abuse   Type 2 diabetes mellitus with complication, with long-term current use of insulin (HCC)   Chronic alcoholic pancreatitis (HCC)   Chronic hepatitis C (HCC)  Brief narrative/history of present illness 56 y.o.malewith medical history significant of hepatitis C, IV heroin use, chronic pancreatitis and poorly controlled diabetes mellitus on insulin who noted that his sugars were running high on the evening of 12/31. The patient had already taken his own Lantus 25 units around 10 PM. His sugars running high, he decided to take an extra dose and reached into the refrigerator, but instead took his sisters long acting insulin. When he checked his blood sugar, it was in the 30s. The patient's sister called EMS after she felt like he was confused. Paramedics came and gave the patient oral glucose and his sugar improved to the 70s. Initially he refused transfer to the emergency room. Patient then made big breakfast and when his blood sugars checked again, it was found to be low in the 30s. EMS was called again and patient was brought into the emergency room.  Hospital course Hypoglycemia in Type 2 diabetes mellitus uncontrolledwith peripheral neuropathy complication, with long-term current use of insulin (HCC) -Patient monitored on D5. Likely secondary to accidental overdose of insulin. CBG has been in higher range past 24  hours (dropped to 57 early in the morning but patient was asymptomatic). -Lantus was resumed at lower dose (20 units daily). Since his CBG has improved I instructed him to continue his home dose of Lantus (25 units) and metformin. -A1C has improved from previously (7.8) -patient noted to have low fsg in the mornings, instructed to have a bedtime snack to avoid am hypoglycemia. Also instructed to keep a log of his blood glucose monitoring ensured to his PCP.   Tobacco abuse: Plans to Quit. Prescribed nicotine patch.  IV heroin use:  patient was Counseled  Chronic hepatitis C (HCC):   Chronic alcoholic pancreatitis Patient reports that his quit drinking. Continue pancreatic enzyme.  Dyslipidemia  Continue statin   Procedure None  Consults: None  Family Communication: None at bedside  Disposition: Home   Discharge Instructions   Allergies as of 06/24/2016      Reactions   Ativan [lorazepam] Other (See Comments)   HALLUCINATIONS      Medication List    STOP taking these medications   HYDROcodone-acetaminophen 5-325 MG tablet Commonly known as:  NORCO   traMADol 50 MG tablet Commonly known as:  ULTRAM     TAKE these medications   aspirin EC 81 MG tablet Take 1 tablet (81 mg total) by mouth daily.   gabapentin 300 MG capsule Commonly known as:  NEURONTIN Take 1 capsule (300 mg total) by mouth at bedtime. For diabetic nerve pain   ibuprofen 800 MG tablet Commonly known as:  ADVIL,MOTRIN Take 1 tablet (800 mg total) by mouth every 12 (twelve) hours as needed. What changed:  reasons to take this   Insulin Glargine 100 UNIT/ML Solostar Pen  Commonly known as:  LANTUS SOLOSTAR Inject 25 Units into the skin daily at 10 pm. What changed:  when to take this   lipase/protease/amylase 54098 units Cpep capsule Commonly known as:  CREON Take 1 capsule (12,000 Units total) by mouth 3 (three) times daily before meals. What changed:  when to take this  reasons  to take this   meloxicam 15 MG tablet Commonly known as:  MOBIC Take 15 mg by mouth daily.   metFORMIN 1000 MG tablet Commonly known as:  GLUCOPHAGE Take 1 tablet (1,000 mg total) by mouth 2 (two) times daily with a meal.   mirtazapine 15 MG tablet Commonly known as:  REMERON Take 1 tablet (15 mg total) by mouth at bedtime.   multivitamin with minerals tablet Take 1 tablet by mouth daily.   nicotine 14 mg/24hr patch Commonly known as:  NICODERM CQ - dosed in mg/24 hours Place 1 patch (14 mg total) onto the skin daily.   pravastatin 20 MG tablet Commonly known as:  PRAVACHOL Take 1 tablet (20 mg total) by mouth at bedtime.   TRUEPLUS LANCETS 28G Misc USE TO CHECK SUGARS 3 TIMES PER DAY      Follow-up Information    Jaclyn Shaggy, MD. Schedule an appointment as soon as possible for a visit in 1 week(s).   Specialty:  Family Medicine Contact information: [address] [place] [address] [phone]          Allergies  Allergen Reactions  . Ativan [Lorazepam] Other (See Comments)    HALLUCINATIONS          Subjective: CBG better past 24 hours. Denies any symptoms.  Discharge Exam: Vitals:   06/23/16 2014 06/24/16 0426  BP: 135/82 113/76  Pulse: (!) 101 100  Resp: 18 16  Temp: 98.2 F (36.8 C) 97.9 F (36.6 C)   Vitals:   06/23/16 1414 06/23/16 2014 06/24/16 0426 06/24/16 0536  BP: (!) 131/91 135/82 113/76   Pulse: 87 (!) 101 100   Resp: 18 18 16    Temp: 98.1 F (36.7 C) 98.2 F (36.8 C) 97.9 F (36.6 C)   TempSrc: Oral Oral Oral   SpO2: 100% 99% 100%   Weight:    62.2 kg (137 lb 2 oz)  Height:        General: Middle aged man not in distress HEENT: Moist, supple neck Chest: Clear bilaterally CVS: Normal S1 and S2, no murmurs GI: Soft, nondistended, nontender musculoskeletal: Warm, no edema    The results of significant diagnostics from this hospitalization (including imaging, microbiology, ancillary and laboratory) are  listed below for reference.     Microbiology: No results found for this or any previous visit (from the past 240 hour(s)).   Labs: BNP (last 3 results) No results for input(s): BNP in the last 8760 hours. Basic Metabolic Panel:  Recent Labs Lab 06/22/16 1543 06/24/16 0615  NA 139 139  K 3.7 4.3  CL 105 108  CO2 27 25  GLUCOSE 116* 216*  BUN 13 16  CREATININE 0.91 1.03  CALCIUM 8.9 8.5*   Liver Function Tests:  Recent Labs Lab 06/22/16 1543 06/24/16 0615  AST 45* 42*  ALT 34 28  ALKPHOS 77 64  BILITOT 0.5 0.4  PROT 7.3 5.9*  ALBUMIN 3.9 3.1*   No results for input(s): LIPASE, AMYLASE in the last 168 hours. No results for input(s): AMMONIA in the last 168 hours. CBC:  Recent Labs Lab 06/22/16 1543  WBC 5.1  NEUTROABS  2.9  HGB 11.0*  HCT 32.9*  MCV 80.4  PLT 157   Cardiac Enzymes: No results for input(s): CKTOTAL, CKMB, CKMBINDEX, TROPONINI in the last 168 hours. BNP: Invalid input(s): POCBNP CBG:  Recent Labs Lab 06/23/16 2017 06/23/16 2347 06/24/16 0346 06/24/16 0613 06/24/16 0735  GLUCAP 115* 326* 55* 201* 319*   D-Dimer No results for input(s): DDIMER in the last 72 hours. Hgb A1c  Recent Labs  06/23/16 0521  HGBA1C 7.8*   Lipid Profile No results for input(s): CHOL, HDL, LDLCALC, TRIG, CHOLHDL, LDLDIRECT in the last 72 hours. Thyroid function studies No results for input(s): TSH, T4TOTAL, T3FREE, THYROIDAB in the last 72 hours.  Invalid input(s): FREET3 Anemia work up No results for input(s): VITAMINB12, FOLATE, FERRITIN, TIBC, IRON, RETICCTPCT in the last 72 hours. Urinalysis    Component Value Date/Time   COLORURINE YELLOW 01/03/2016 2330   APPEARANCEUR CLEAR 01/03/2016 2330   LABSPEC 1.026 01/03/2016 2330   PHURINE 5.5 01/03/2016 2330   GLUCOSEU >1000 (A) 01/03/2016 2330   HGBUR NEGATIVE 01/03/2016 2330   BILIRUBINUR NEGATIVE 01/03/2016 2330   BILIRUBINUR neg 09/26/2015 1531   KETONESUR NEGATIVE 01/03/2016 2330    PROTEINUR NEGATIVE 01/03/2016 2330   UROBILINOGEN 0.2 09/26/2015 1531   UROBILINOGEN 0.2 12/05/2013 0721   NITRITE NEGATIVE 01/03/2016 2330   LEUKOCYTESUR NEGATIVE 01/03/2016 2330   Sepsis Labs Invalid input(s): PROCALCITONIN,  WBC,  LACTICIDVEN Microbiology No results found for this or any previous visit (from the past 240 hour(s)).   Time coordinating discharge: < 30 minutes  SIGNED:   Eddie North, MD  [facility] 06/24/2016, 8:49 AM Pager   If 7PM-7AM, please contact night-coverage [email or web address] Password [email or web address]

## 2016-06-24 NOTE — Progress Notes (Signed)
Spoke with patient at bedside. He states he feels much improved and is ready for d/c home. [facility] told him I rescheduled his appt that he had today since he was here. He was appreciative, f/u appt placed in [facility]. [facility] no issues with getting scripts filled. No further issues or questions.

## 2016-06-24 NOTE — Progress Notes (Addendum)
CRITICAL VALUE ALERT  Critical value received:  cbg 67  Date of notification:  06-24-16  Time of notification:  1133  Critical value read back:Yes.    Nurse who received alert:  Deonne Rooks rn  MD notified (1st page):  [email or web address]  Time of first page:  1135  MD notified (2nd page):  Time of second page:  Responding MD:  Dhungel  Time MD responded:    Pt given ice cream. Will recheck.

## 2016-06-24 NOTE — Progress Notes (Signed)
D/C instructions reviewed with patient. Next dose times written and reviewed with patient by this RN. Patient verbalizes understanding. IV removed. Pt awaiting ride from sister.

## 2016-06-24 NOTE — Discharge Instructions (Signed)
Hypoglycemia  Hypoglycemia is when the sugar (glucose) level in the blood is too low. Symptoms of low blood sugar may include:  Feeling:  Hungry.  Worried or nervous (anxious).  Sweaty and clammy.  Confused.  Dizzy.  Sleepy.  Sick to your stomach (nauseous).  Having:  A fast heartbeat.  A headache.  A change in your vision.  Jerky movements that you cannot control (seizure).  Nightmares.  Tingling or no feeling (numbness) around the mouth, lips, or tongue.  Having trouble with:  Talking.  Paying attention (concentrating).  Moving (coordination).  Sleeping.  Shaking.  Passing out (fainting).  Getting upset easily (irritability). Low blood sugar can happen to people who have diabetes and people who do not have diabetes. Low blood sugar can happen quickly, and it can be an emergency. Treating Low Blood Sugar  Low blood sugar is often treated by eating or drinking something sugary right away. If you can think clearly and swallow safely, follow the 15:15 rule:  Take 15 grams of a fast-acting carb (carbohydrate). Some fast-acting carbs are:  1 tube of glucose gel.  3 sugar tablets (glucose pills).  6-8 pieces of hard candy.  4 oz (120 mL) of fruit juice.  4 oz (120 mL) of regular (not diet) soda.  Check your blood sugar 15 minutes after you take the carb.  If your blood sugar is still at or below 70 mg/dL (3.9 mmol/L), take 15 grams of a carb again.  If your blood sugar does not go above 70 mg/dL (3.9 mmol/L) after 3 tries, get help right away.  After your blood sugar goes back to normal, eat a meal or a snack within 1 hour. Treating Very Low Blood Sugar  If your blood sugar is at or below 54 mg/dL (3 mmol/L), you have very low blood sugar (severe hypoglycemia). This is an emergency. Do not wait to see if the symptoms will go away. Get medical help right away. Call your local emergency services (911 in the [place] Do not drive yourself to the  hospital. If you have very low blood sugar and you cannot eat or drink, you may need a glucagon shot (injection). A family member or friend should learn how to check your blood sugar and how to give you a glucagon shot. Ask your doctor if you need to have a glucagon shot kit at home. Follow these instructions at home: General instructions  Avoid any diets that cause you to not eat enough food. Talk with your doctor before you start any new diet.  Take over-the-counter and prescription medicines only as told by your doctor.  Limit alcohol to no more than 1 drink per day for nonpregnant women and 2 drinks per day for men. One drink equals 12 oz of beer, 5 oz of wine, or 1 oz of hard liquor.  Keep all follow-up visits as told by your doctor. This is important. If You Have Diabetes:   Make sure you know the symptoms of low blood sugar.  Always keep a source of sugar with you, such as:  Sugar.  Sugar tablets.  Glucose gel.  Fruit juice.  Regular soda (not diet soda).  Milk.  Hard candy.  Honey.  Take your medicines as told.  Follow your exercise and meal plan.  Eat on time. Do not skip meals.  Follow your sick day plan when you cannot eat or drink normally. Make this plan ahead of time with your doctor.  Check your blood sugar as  often as told by your doctor. Always check before and after exercise.  Share your diabetes care plan with:  Your work or school.  People you live with.  Check your pee (urine) for ketones:  When you are sick.  As told by your doctor.  Carry a card or wear jewelry that says you have diabetes. If You Have Low Blood Sugar From Other Causes:   Check your blood sugar as often as told by your doctor.  Follow instructions from your doctor about what you cannot eat or drink. Contact a doctor if:  You have trouble keeping your blood sugar in your target range.  You have low blood sugar often. Get help right away if:  You still have  symptoms after you eat or drink something sugary.  Your blood sugar is at or below 54 mg/dL (3 mmol/L).  You have jerky movements that you cannot control.  You pass out. These symptoms may be an emergency. Do not wait to see if the symptoms will go away. Get medical help right away. Call your local emergency services (911 in the [place] Do not drive yourself to the hospital.  This information is not intended to replace advice given to you by your health care provider. Make sure you discuss any questions you have with your health care provider. Document Released: 09/02/2009 Document Revised: 11/14/2015 Document Reviewed: 07/12/2015 Elsevier Interactive Patient Education  2017 [employer].

## 2016-06-24 NOTE — Telephone Encounter (Signed)
Pt. Came into facility requesting an Rx for a meter. Pt. States he used to have one and it does not work anymore.  Please f/u

## 2016-06-25 ENCOUNTER — Ambulatory Visit: Payer: Self-pay

## 2016-06-25 ENCOUNTER — Encounter: Payer: Self-pay | Admitting: Family Medicine

## 2016-06-25 ENCOUNTER — Ambulatory Visit: Payer: Self-pay | Attending: Family Medicine | Admitting: Family Medicine

## 2016-06-25 VITALS — BP 121/83 | HR 104 | Temp 98.3°F | Ht 67.0 in | Wt 139.8 lb

## 2016-06-25 DIAGNOSIS — E118 Type 2 diabetes mellitus with unspecified complications: Secondary | ICD-10-CM | POA: Insufficient documentation

## 2016-06-25 DIAGNOSIS — Z7982 Long term (current) use of aspirin: Secondary | ICD-10-CM | POA: Insufficient documentation

## 2016-06-25 DIAGNOSIS — E1142 Type 2 diabetes mellitus with diabetic polyneuropathy: Secondary | ICD-10-CM | POA: Insufficient documentation

## 2016-06-25 DIAGNOSIS — E119 Type 2 diabetes mellitus without complications: Secondary | ICD-10-CM

## 2016-06-25 DIAGNOSIS — K860 Alcohol-induced chronic pancreatitis: Secondary | ICD-10-CM | POA: Insufficient documentation

## 2016-06-25 DIAGNOSIS — Z794 Long term (current) use of insulin: Secondary | ICD-10-CM | POA: Insufficient documentation

## 2016-06-25 DIAGNOSIS — Z79899 Other long term (current) drug therapy: Secondary | ICD-10-CM | POA: Insufficient documentation

## 2016-06-25 LAB — GLUCOSE, POCT (MANUAL RESULT ENTRY): POC GLUCOSE: 321 mg/dL — AB (ref 70–99)

## 2016-06-25 MED ORDER — INSULIN ASPART 100 UNIT/ML ~~LOC~~ SOLN: SUBCUTANEOUS | 5 refills | Status: DC

## 2016-06-25 MED ORDER — PANCRELIPASE (LIP-PROT-AMYL) 3000-10000 UNITS PO CPEP: 3000.0000 [IU] | ORAL_CAPSULE | Freq: Three times a day (TID) | ORAL | 3 refills | Status: DC

## 2016-06-25 MED ORDER — GLUCOSE BLOOD VI STRP: ORAL_STRIP | 12 refills | Status: DC

## 2016-06-25 MED ORDER — TRUE METRIX METER DEVI: 1.0000 | Freq: Three times a day (TID) | 0 refills | Status: DC

## 2016-06-25 MED ORDER — ATORVASTATIN CALCIUM 40 MG PO TABS: 40.0000 mg | ORAL_TABLET | Freq: Every day | ORAL | 5 refills | Status: DC

## 2016-06-25 MED ORDER — GABAPENTIN 300 MG PO CAPS: ORAL_CAPSULE | ORAL | 5 refills | Status: DC

## 2016-06-25 MED ORDER — INSULIN GLARGINE 100 UNIT/ML SOLOSTAR PEN: 25.0000 [IU] | PEN_INJECTOR | Freq: Every day | SUBCUTANEOUS | 5 refills | Status: DC

## 2016-06-25 MED ORDER — TRUEPLUS LANCETS 28G MISC
5 refills | Status: DC
Start: 2016-06-25 — End: 2016-08-26

## 2016-06-25 MED FILL — TRUEplus LANCETS 28G MISC: 33 days supply | Qty: 100 | Fill #0

## 2016-06-25 MED FILL — !NOVOLOG 100UNITS/ML VIAL: 100/ML | 30 days supply | Qty: 10 | Fill #0

## 2016-06-25 MED FILL — TRUE METRIX BLOOD GLUCOSE M: W/DEVICE | 365 days supply | Qty: 1 | Fill #0

## 2016-06-25 MED FILL — TRUE METRIX TEST STRIP: 33 days supply | Qty: 100 | Fill #0

## 2016-06-25 NOTE — Progress Notes (Signed)
Needs medication refills including a new meter

## 2016-06-25 NOTE — Progress Notes (Signed)
Subjective:  Patient ID: Raymond Winters, male    DOB: [birth year]  Age: 56 y.o. MRN: [id number]  CC: Hospitalization Follow-up and Diabetes   HPI Raymond Winters is a 56 year old male with history of type 2 diabetes mellitus (A1c 7.8 which is down from 11.1 previously), diabetic neuropathy, chronic alcoholic pancreatitis who presents today for follow-up for hospitalization from 06/22/16 through 06/24/16) where he was managed for hypoglycemia as a result of insulin overdose. He recorded a blood sugar greater than 600 after he had taken his Lantus and went on to take an additional dose of Lantus mistakenly which happened to be his sister's  He reports feeling well and denies any hypoglycemia since discharge and has been compliant with his medications. He was previously on metformin which was discontinued due to loose bowels. He had sent in a notarized letter requesting his [name] M. Scott) take charge of his financial and other matters . 6 months effective 05/21/16 due to the fact that he was incarcerated.  He has no acute concerns at this time.  Past Medical History:  Diagnosis Date  . Alcohol dependence (HCC) 01/05/2016  . Alcoholic liver disease, unspecified 01/05/2016  . Arthritis    shoulders  . Chronic alcoholic pancreatitis (HCC) 01/05/2016  . Depression   . Family history of adverse reaction to anesthesia    pt's sister has hx. of post-op N/V  . Hepatitis C   . Heroin abuse   . High cholesterol   . Hypoalbuminemia due to protein-calorie malnutrition (HCC) 01/05/2016  . Insulin dependent diabetes mellitus (HCC)   . Lactic acid increased, CHRONIC 01/05/2016   Secondary to Liver Disease, Alcoholism, and Diabetes  . 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 Shoulder wound 01/2016   left    Past Surgical History:  Procedure Laterality Date  . APPLICATION OF WOUND VAC Left 12/24/2015   Procedure: APPLICATION OF WOUND VAC; LEFT SHOULDER;  Surgeon: Tarry Kos, MD;   Location: [facility];  Service: Orthopedics;  Laterality: Left;  . APPLICATION OF WOUND VAC Left 01/29/2016   Procedure: LEFT SHOULDER SPLIT THICKNESS SKIN GRAFT, WOUND VAC;  Surgeon: Tarry Kos, MD;  Location: [facility];  Service: Orthopedics;  Laterality: Left;  . I&D EXTREMITY Right 03/20/2013   Procedure: IRRIGATION AND DEBRIDEMENT EXTREMITY;  Surgeon: Marlowe Shores, MD;  Location: [facility];  Service: Orthopedics;  Laterality: Right;  . I&D EXTREMITY Left 12/24/2015   Procedure:  DEBRIDEMENT OF MUSCLE, SUBCUTANEOUS TISSUE LEFT SHOULDER;  Surgeon: Tarry Kos, MD;  Location: [facility];  Service: Orthopedics;  Laterality: Left;  . IRRIGATION AND DEBRIDEMENT SHOULDER Left 12/27/2015   Procedure: IRRIGATION AND DEBRIDEMENT LEFT SHOULDER;  Surgeon: Tarry Kos, MD;  Location: [facility];  Service: Orthopedics;  Laterality: Left;  . IRRIGATION AND DEBRIDEMENT SHOULDER Left 12/30/2015   Procedure: IRRIGATION AND DEBRIDEMENT LEFT SHOULDER;  Surgeon: Tarry Kos, MD;  Location: [facility];  Service: Orthopedics;  Laterality: Left;  . SKIN SPLIT GRAFT Left 01/29/2016   Procedure: SKIN GRAFT SPLIT THICKNESS,;  Surgeon: Tarry Kos, MD;  Location: [facility];  Service: Orthopedics;  Laterality: Left;    Allergies  Allergen Reactions  . Ativan [Lorazepam] Other (See Comments)    HALLUCINATIONS     Outpatient Medications Prior to Visit  Medication Sig Dispense Refill  . ibuprofen (ADVIL,MOTRIN) 800 MG tablet Take 1 tablet (800 mg total) by mouth every 12 (twelve) hours as needed. (Patient taking  differently: Take 800 mg by mouth every 12 (twelve) hours as needed for moderate pain. ) 30 tablet 1  . meloxicam (MOBIC) 15 MG tablet Take 15 mg by mouth daily.    . mirtazapine (REMERON) 15 MG tablet Take 1 tablet (15 mg total) by mouth at bedtime. 30 tablet 2  . Multiple Vitamins-Minerals (MULTIVITAMIN WITH MINERALS) tablet Take 1 tablet by mouth daily.    Marland Kitchen gabapentin (NEURONTIN) 300 MG  capsule TAKE 1 CAPSULE BY MOUTH AT BEDTIME FOR DIABETIC NERVE PAIN 30 capsule 0  . Insulin Glargine (LANTUS SOLOSTAR) 100 UNIT/ML Solostar Pen Inject 25 Units into the skin daily at 10 pm. (Patient taking differently: Inject 25 Units into the skin daily. ) 5 pen 3  . lipase/protease/amylase (CREON) 12000 units CPEP capsule Take 1 capsule (12,000 Units total) by mouth 3 (three) times daily before meals. (Patient taking differently: Take 12,000 Units by mouth 3 (three) times daily as needed (stomach ulcer/coating). ) 90 capsule 0  . pravastatin (PRAVACHOL) 20 MG tablet Take 1 tablet (20 mg total) by mouth at bedtime. 30 tablet 3  . aspirin EC 81 MG tablet Take 1 tablet (81 mg total) by mouth daily. (Patient not taking: Reported on 06/25/2016) 30 tablet 5  . nicotine (NICODERM CQ - DOSED IN MG/24 HOURS) 14 mg/24hr patch Place 1 patch (14 mg total) onto the skin daily. (Patient not taking: Reported on 06/25/2016) 28 patch 0  . metFORMIN (GLUCOPHAGE) 1000 MG tablet Take 1 tablet (1,000 mg total) by mouth 2 (two) times daily with a meal. (Patient not taking: Reported on 06/25/2016) 60 tablet 3  . TRUEPLUS LANCETS 28G MISC USE TO CHECK SUGARS 3 TIMES PER DAY (Patient not taking: Reported on 06/25/2016) 100 each 5   No facility-administered medications prior to visit.     ROS Review of Systems  Constitutional: Negative for activity change and appetite change.  HENT: Negative for sinus pressure and sore throat.   Eyes: Negative for visual disturbance.  Respiratory: Negative for cough, chest tightness and shortness of breath.   Cardiovascular: Negative for chest pain and leg swelling.  Gastrointestinal: Negative for abdominal distention, abdominal pain, constipation and diarrhea.  Endocrine: Negative.   Genitourinary: Negative for dysuria.  Musculoskeletal: Negative for joint swelling and myalgias.  Skin: Negative for rash.  Allergic/Immunologic: Negative.   Neurological: Negative for weakness,  light-headedness and numbness.  Psychiatric/Behavioral: Negative for dysphoric mood and suicidal ideas.    Objective:  BP 121/83 (BP Location: Right Arm, Patient Position: Sitting, Cuff Size: Large)   Pulse (!) 104   Temp 98.3 F (36.8 C) (Oral)   Ht 5\' 7"  (1.702 m)   Wt 139 lb 12.8 oz (63.4 kg)   SpO2 99%   BMI 21.90 kg/m   BP/Weight 06/25/2016 06/24/2016 02/03/2016  Systolic BP 121 125 97  Diastolic BP 83 75 61  Wt. (Lbs) 139.8 137.13 107.2  BMI 21.9 21.48 16.79      Physical Exam  Constitutional: He is oriented to person, place, and time. He appears well-developed and well-nourished.  Cardiovascular: Normal rate, normal heart sounds and intact distal pulses.   No murmur heard. Pulmonary/Chest: Effort normal and breath sounds normal. He has no wheezes. He has no rales. He exhibits no tenderness.  Abdominal: Soft. Bowel sounds are normal. He exhibits no distension and no mass. There is no tenderness.  Musculoskeletal: Normal range of motion.  Neurological: He is alert and oriented to person, place, and time.     Lab Results  Component Value Date   HGBA1C 7.8 (H) 06/23/2016    Assessment & Plan:   1. Diabetic polyneuropathy associated with type 2 diabetes mellitus (HCC) Stable - gabapentin (NEURONTIN) 300 MG capsule; TAKE 1 CAPSULE BY MOUTH AT BEDTIME FOR DIABETIC NERVE PAIN  Dispense: 30 capsule; Refill: 5  2. Type 2 diabetes mellitus with complication, with long-term current use of insulin (HCC) Improved with A1c of 7.8 down from 11.1 previously NovoLog sliding scale added to  regimen and hypoglycemia protocols reviewed Educated on difference between Lantus and NovoLog - Glucose (CBG) - Insulin Glargine (LANTUS SOLOSTAR) 100 UNIT/ML Solostar Pen; Inject 25 Units into the skin daily at 10 pm.  Dispense: 5 pen; Refill: 5 - insulin aspart (NOVOLOG) 100 UNIT/ML injection; 0-12 units subcutaneously before meals as per sliding scale  Dispense: 10 mL; Refill: 5 -  Microalbumin / creatinine urine ratio; Future - Lipid Panel w/reflex Direct LDL; Future - COMPLETE METABOLIC PANEL WITH GFR; Future  3. Type 2 diabetes mellitus without complication, with long-term current use of insulin (HCC) - atorvastatin (LIPITOR) 40 MG tablet; Take 1 tablet (40 mg total) by mouth daily.  Dispense: 30 tablet; Refill: 5 - glucose blood (TRUE METRIX BLOOD GLUCOSE TEST) test strip; Use 3 times daily before meals  Dispense: 100 each; Refill: 12 - Blood Glucose Monitoring Suppl (TRUE METRIX METER) DEVI; 1 each by Does not apply route 3 (three) times daily before meals.  Dispense: 1 Device; Refill: 0 - TRUEPLUS LANCETS 28G MISC; USE TO CHECK SUGARS 3 TIMES PER DAY  Dispense: 100 each; Refill: 5  4. Chronic alcoholic pancreatitis (HCC) -Placed on Zenpep  Provided a letter for the patient's request authorizing sister to make financial and other decisions for him for 6 months  Meds ordered this encounter  Medications  . atorvastatin (LIPITOR) 40 MG tablet    Sig: Take 1 tablet (40 mg total) by mouth daily.    Dispense:  30 tablet    Refill:  5  . gabapentin (NEURONTIN) 300 MG capsule    Sig: TAKE 1 CAPSULE BY MOUTH AT BEDTIME FOR DIABETIC NERVE PAIN    Dispense:  30 capsule    Refill:  5    Must have office visit for refills  . Insulin Glargine (LANTUS SOLOSTAR) 100 UNIT/ML Solostar Pen    Sig: Inject 25 Units into the skin daily at 10 pm.    Dispense:  5 pen    Refill:  5    Discontinue previous dose  . Pancrelipase, Lip-Prot-Amyl, (ZENPEP) 3000-10000 units CPEP    Sig: Take 3,000 Units by mouth 3 (three) times daily after meals.    Dispense:  90 capsule    Refill:  3  . insulin aspart (NOVOLOG) 100 UNIT/ML injection    Sig: 0-12 units subcutaneously before meals as per sliding scale    Dispense:  10 mL    Refill:  5  . glucose blood (TRUE METRIX BLOOD GLUCOSE TEST) test strip    Sig: Use 3 times daily before meals    Dispense:  100 each    Refill:  12  . Blood  Glucose Monitoring Suppl (TRUE METRIX METER) DEVI    Sig: 1 each by Does not apply route 3 (three) times daily before meals.    Dispense:  1 Device    Refill:  0  . TRUEPLUS LANCETS 28G MISC    Sig: USE TO CHECK SUGARS 3 TIMES PER DAY    Dispense:  100 each  Refill:  5    Follow-up: Return in about 3 months (around 09/23/2016) for Follow-up on diabetes mellitus.   Jaclyn Shaggy MD

## 2016-06-25 NOTE — Telephone Encounter (Signed)
Patient has appt today 06/25/16

## 2016-06-26 MED ORDER — PANCRELIPASE (LIP-PROT-AMYL) 25000 UNITS PO CPEP: 25000.0000 [IU] | ORAL_CAPSULE | Freq: Three times a day (TID) | ORAL | 3 refills | Status: DC

## 2016-06-26 MED FILL — !ZENPEP DR 25000 UNITS CAP: 25-86-136K | 20 days supply | Qty: 60 | Fill #0

## 2016-06-29 ENCOUNTER — Ambulatory Visit: Payer: Self-pay | Attending: Family Medicine

## 2016-06-29 ENCOUNTER — Telehealth: Payer: Self-pay | Admitting: Family Medicine

## 2016-06-29 DIAGNOSIS — E118 Type 2 diabetes mellitus with unspecified complications: Secondary | ICD-10-CM | POA: Insufficient documentation

## 2016-06-29 DIAGNOSIS — Z794 Long term (current) use of insulin: Secondary | ICD-10-CM | POA: Insufficient documentation

## 2016-06-29 LAB — COMPLETE METABOLIC PANEL WITH GFR
ALT: 36 U/L (ref 9–46)
AST: 60 U/L — ABNORMAL HIGH (ref 10–35)
Albumin: 3.7 g/dL (ref 3.6–5.1)
Alkaline Phosphatase: 74 U/L (ref 40–115)
BUN: 10 mg/dL (ref 7–25)
CHLORIDE: 105 mmol/L (ref 98–110)
CO2: 27 mmol/L (ref 20–31)
CREATININE: 1.16 mg/dL (ref 0.70–1.33)
Calcium: 8.8 mg/dL (ref 8.6–10.3)
GFR, EST AFRICAN AMERICAN: 80 mL/min (ref >=60)
GFR, Est Non African American: 70 mL/min (ref >=60)
Glucose, Bld: 63 mg/dL — ABNORMAL LOW (ref 65–99)
POTASSIUM: 3.7 mmol/L (ref 3.5–5.3)
Sodium: 140 mmol/L (ref 135–146)
Total Bilirubin: 0.4 mg/dL (ref 0.2–1.2)
Total Protein: 7.1 g/dL (ref 6.1–8.1)

## 2016-06-29 LAB — LIPID PANEL W/REFLEX DIRECT LDL
CHOL/HDL RATIO: 2.1 ratio (ref <5.0)
Cholesterol: 81 mg/dL (ref <200)
HDL: 38 mg/dL — ABNORMAL LOW (ref >40)
LDL-CHOLESTEROL: 25 mg/dL
Non-HDL Cholesterol (Calc): 43 mg/dL (ref <130)
TRIGLYCERIDES: 101 mg/dL (ref <150)

## 2016-06-29 MED ORDER — INSULIN ASPART 100 UNIT/ML FLEXPEN: PEN_INJECTOR | SUBCUTANEOUS | 3 refills | Status: DC

## 2016-06-29 NOTE — Progress Notes (Signed)
Patient here for lab visit only 

## 2016-06-29 NOTE — Telephone Encounter (Signed)
Novolog vials switched to SYSCOovolog FlexPen

## 2016-06-29 NOTE — Telephone Encounter (Signed)
Pts sister requesting the insulin injection be changed to the insulin pen States pt used to partake in drugs and does not want pt to relapse due to familiarity

## 2016-06-30 LAB — MICROALBUMIN / CREATININE URINE RATIO
Creatinine, Urine: 110 mg/dL (ref 20–370)
Microalb Creat Ratio: 21 mcg/mg creat (ref <30)
Microalb, Ur: 2.3 mg/dL

## 2016-07-02 ENCOUNTER — Telehealth: Payer: Self-pay

## 2016-07-02 NOTE — Telephone Encounter (Signed)
-----   Message from Jaclyn ShaggyEnobong Amao, MD sent at 06/30/2016 12:36 PM EST ----- Normal cholesterol, one of his liver enzymes shows a slight elevation which will be monitored.

## 2016-07-02 NOTE — Telephone Encounter (Signed)
Writer called patient per MD and explained lab results to his sister.  She stated understanding and will let patient know.

## 2016-07-08 ENCOUNTER — Ambulatory Visit: Payer: Self-pay | Admitting: Family Medicine

## 2016-07-15 ENCOUNTER — Other Ambulatory Visit: Payer: Self-pay

## 2016-07-15 DIAGNOSIS — Z794 Long term (current) use of insulin: Principal | ICD-10-CM

## 2016-07-15 DIAGNOSIS — E118 Type 2 diabetes mellitus with unspecified complications: Secondary | ICD-10-CM

## 2016-07-15 MED ORDER — INSULIN GLARGINE 100 UNIT/ML SOLOSTAR PEN: 25.0000 [IU] | PEN_INJECTOR | Freq: Every day | SUBCUTANEOUS | 3 refills | Status: DC

## 2016-07-27 MED FILL — !LANTUS SOLOSTAR 100UNITS/M: 100 | 24 days supply | Qty: 6 | Fill #4

## 2016-07-28 MED FILL — IBUPROFEN 800 MG TABLET: 800 | 15 days supply | Qty: 30 | Fill #1

## 2016-07-28 MED FILL — ATORVASTATIN 40 MG TABLET: 40 | 30 days supply | Qty: 30 | Fill #0

## 2016-07-28 MED FILL — MIRTAZAPINE 15 MG TABLET: 15 | 30 days supply | Qty: 30 | Fill #1

## 2016-07-29 ENCOUNTER — Other Ambulatory Visit: Payer: Self-pay | Admitting: Family Medicine

## 2016-07-29 MED ORDER — PANCRELIPASE (LIP-PROT-AMYL) 40000-136000 UNITS PO CPEP
ORAL_CAPSULE | ORAL | 3 refills | Status: DC
Start: 2016-07-29 — End: 2016-11-03

## 2016-07-30 MED FILL — !ZENPEP DR 40000 UNITS CAP: 40000 UNITS | 30 days supply | Qty: 90 | Fill #0

## 2016-07-31 MED FILL — !NOVOLOG 100UNITS/ML VIAL: 100/ML | 20 days supply | Qty: 10 | Fill #0

## 2016-08-16 ENCOUNTER — Emergency Department (HOSPITAL_COMMUNITY)
Admission: EM | Admit: 2016-08-16 | Discharge: 2016-08-16 | Disposition: A | Discharge status: Home / Self Care | Payer: Medicaid Other | Attending: Emergency Medicine | Admitting: Emergency Medicine

## 2016-08-16 ENCOUNTER — Encounter (HOSPITAL_COMMUNITY): Payer: Self-pay | Admitting: Nurse Practitioner

## 2016-08-16 DIAGNOSIS — Z794 Long term (current) use of insulin: Secondary | ICD-10-CM | POA: Insufficient documentation

## 2016-08-16 DIAGNOSIS — Z7982 Long term (current) use of aspirin: Secondary | ICD-10-CM | POA: Diagnosis not present

## 2016-08-16 DIAGNOSIS — M7061 Trochanteric bursitis, right hip: Secondary | ICD-10-CM | POA: Diagnosis not present

## 2016-08-16 DIAGNOSIS — E119 Type 2 diabetes mellitus without complications: Secondary | ICD-10-CM | POA: Insufficient documentation

## 2016-08-16 DIAGNOSIS — M25551 Pain in right hip: Secondary | ICD-10-CM | POA: Diagnosis present

## 2016-08-16 DIAGNOSIS — Z79899 Other long term (current) drug therapy: Secondary | ICD-10-CM | POA: Diagnosis not present

## 2016-08-16 DIAGNOSIS — L03119 Cellulitis of unspecified part of limb: Secondary | ICD-10-CM | POA: Insufficient documentation

## 2016-08-16 DIAGNOSIS — F17210 Nicotine dependence, cigarettes, uncomplicated: Secondary | ICD-10-CM | POA: Diagnosis not present

## 2016-08-16 DIAGNOSIS — Y939 Activity, unspecified: Secondary | ICD-10-CM | POA: Insufficient documentation

## 2016-08-16 MED ORDER — CEPHALEXIN 500 MG PO CAPS
500.0000 mg | ORAL_CAPSULE | Freq: Once | ORAL | Status: AC
  Administered 2016-08-16: 500 mg via ORAL
  Filled 2016-08-16: qty 1

## 2016-08-16 MED ORDER — CEPHALEXIN 500 MG PO CAPS: 500.0000 mg | ORAL_CAPSULE | Freq: Four times a day (QID) | ORAL | 0 refills | Status: DC

## 2016-08-16 MED ORDER — OXYCODONE-ACETAMINOPHEN 5-325 MG PO TABS: 2.0000 | ORAL_TABLET | ORAL | 0 refills | Status: DC | PRN

## 2016-08-16 MED ORDER — OXYCODONE-ACETAMINOPHEN 5-325 MG PO TABS
2.0000 | ORAL_TABLET | Freq: Once | ORAL | Status: AC
  Administered 2016-08-16: 2 via ORAL
  Filled 2016-08-16: qty 2

## 2016-08-16 NOTE — ED Notes (Signed)
Bed: WA01 Expected date:  Expected time:  Means of arrival:  Comments: 56 yo abscess to buttock; hyperglycemia

## 2016-08-16 NOTE — ED Triage Notes (Signed)
Right buttock abcess

## 2016-08-16 NOTE — ED Notes (Signed)
Patient c/o right buttock abscess for about week with pain getting more severe. Patient denies any drainage from area.

## 2016-08-16 NOTE — ED Provider Notes (Addendum)
[facility]-EMERGENCY DEPT Provider Note   CSN: [id number] Arrival date & time: 08/16/16  1053     History   Chief Complaint No chief complaint on file.   HPI Raymond Winters is a 56 y.o. male. Chief complaint is right hip pain and redness  HPI patient is a 56 year old diabetic. He is noticed some pain and redness on his right hip for the last week. No fall or injury. That had fevers or chills. States is tender to cannot lay on that hip anymore.  Past Medical History:  Diagnosis Date  . Alcohol dependence (HCC) 01/05/2016  . Alcoholic liver disease, unspecified 01/05/2016  . Arthritis    shoulders  . Chronic alcoholic pancreatitis (HCC) 01/05/2016  . Depression   . Family history of adverse reaction to anesthesia    pt's sister has hx. of post-op N/V  . Hepatitis C   . Heroin abuse   . High cholesterol   . Hypoalbuminemia due to protein-calorie malnutrition (HCC) 01/05/2016  . Insulin dependent diabetes mellitus (HCC)   . Lactic acid increased, CHRONIC 01/05/2016   Secondary to Liver Disease, Alcoholism, and Diabetes  . 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 Shoulder wound 01/2016   left    Patient Active Problem List   Diagnosis Date Noted  . Hypoglycemia 06/22/2016  . Depression 02/03/2016  . Chronic hepatitis C (HCC)   . Alcoholic liver disease, unspecified 01/05/2016  . Alcohol dependence (HCC) 01/05/2016  . Chronic alcoholic pancreatitis (HCC) 01/05/2016  . Hypoalbuminemia due to protein-calorie malnutrition (HCC) 01/05/2016  . Lactic acid increased, CHRONIC 01/05/2016  . Diarrhea 01/04/2016  . Chronic diarrhea 01/04/2016  . Pressure ulcer 01/04/2016  . Uncontrolled type 2 diabetes mellitus with complication, with long-term current use of insulin (HCC)   . Swelling   . Type 2 diabetes mellitus with complication, with long-term current use of insulin (HCC)   . Septic joint of left shoulder region (HCC)   . Abscess 12/24/2015  . Abscess of left  shoulder   . Leukocytosis   . Type 2 diabetes mellitus with complication, without long-term current use of insulin (HCC)   . Glenohumeral arthritis 07/24/2015  . Left shoulder pain 07/24/2015  . 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 Tobacco abuse 07/26/2014  . Protein-calorie malnutrition, severe (HCC) 12/05/2013  . Hyperkalemia 12/04/2013  . Heroin withdrawal (HCC) 12/04/2013  . Alcohol abuse 12/04/2013  . Hyperglycemia 12/04/2013  . Metabolic acidosis with respiratory alkalosis 12/04/2013  . Heroin abuse 04/04/2013  . DM2 (diabetes mellitus, type 2) (HCC) 03/20/2013    Past Surgical History:  Procedure Laterality Date  . APPLICATION OF WOUND VAC Left 12/24/2015   Procedure: APPLICATION OF WOUND VAC; LEFT SHOULDER;  Surgeon: Tarry Kos, MD;  Location: [facility];  Service: Orthopedics;  Laterality: Left;  . APPLICATION OF WOUND VAC Left 01/29/2016   Procedure: LEFT SHOULDER SPLIT THICKNESS SKIN GRAFT, WOUND VAC;  Surgeon: Tarry Kos, MD;  Location: [facility];  Service: Orthopedics;  Laterality: Left;  . I&D EXTREMITY Right 03/20/2013   Procedure: IRRIGATION AND DEBRIDEMENT EXTREMITY;  Surgeon: Marlowe Shores, MD;  Location: [facility];  Service: Orthopedics;  Laterality: Right;  . I&D EXTREMITY Left 12/24/2015   Procedure:  DEBRIDEMENT OF MUSCLE, SUBCUTANEOUS TISSUE LEFT SHOULDER;  Surgeon: Tarry Kos, MD;  Location: [facility];  Service: Orthopedics;  Laterality: Left;  . IRRIGATION AND DEBRIDEMENT SHOULDER Left 12/27/2015   Procedure: IRRIGATION AND DEBRIDEMENT LEFT SHOULDER;  Surgeon: Coralyn Elhadji Pecore  Donnelly Stager, MD;  Location: [facility];  Service: Orthopedics;  Laterality: Left;  . IRRIGATION AND DEBRIDEMENT SHOULDER Left 12/30/2015   Procedure: IRRIGATION AND DEBRIDEMENT LEFT SHOULDER;  Surgeon: Tarry Kos, MD;  Location: [facility];  Service: Orthopedics;  Laterality: Left;  . SKIN SPLIT GRAFT Left 01/29/2016   Procedure: SKIN GRAFT SPLIT THICKNESS,;  Surgeon: Tarry Kos, MD;  Location: [facility];  Service: Orthopedics;  Laterality: Left;       Home Medications    Prior to Admission medications   Medication Sig Start Date End Date Taking? Authorizing Provider  aspirin EC 81 MG tablet Take 1 tablet (81 mg total) by mouth daily. Patient not taking: Reported on 06/25/2016 04/04/14   Ambrose Finland, NP  atorvastatin (LIPITOR) 40 MG tablet Take 1 tablet (40 mg total) by mouth daily. 06/25/16   Jaclyn Shaggy, MD  Blood Glucose Monitoring Suppl (TRUE METRIX METER) DEVI 1 each by Does not apply route 3 (three) times daily before meals. 06/25/16   Jaclyn Shaggy, MD  cephALEXin (KEFLEX) 500 MG capsule Take 1 capsule (500 mg total) by mouth 4 (four) times daily. 08/16/16   Rolland Porter, MD  gabapentin (NEURONTIN) 300 MG capsule TAKE 1 CAPSULE BY MOUTH AT BEDTIME FOR DIABETIC NERVE PAIN 06/25/16   Jaclyn Shaggy, MD  glucose blood (TRUE METRIX BLOOD GLUCOSE TEST) test strip Use 3 times daily before meals 06/25/16   Jaclyn Shaggy, MD  ibuprofen (ADVIL,MOTRIN) 800 MG tablet Take 1 tablet (800 mg total) by mouth every 12 (twelve) hours as needed. Patient taking differently: Take 800 mg by mouth every 12 (twelve) hours as needed for moderate pain.  02/03/16   Jaclyn Shaggy, MD  insulin aspart (NOVOLOG FLEXPEN) 100 UNIT/ML FlexPen 0-12 units before meals sliding scale as directed 06/29/16   Jaclyn Shaggy, MD  Insulin Glargine (LANTUS SOLOSTAR) 100 UNIT/ML Solostar Pen Inject 25 Units into the skin daily at 10 pm. 07/15/16   Jaclyn Shaggy, MD  meloxicam (MOBIC) 15 MG tablet Take 15 mg by mouth daily.    Historical Provider, MD  mirtazapine (REMERON) 15 MG tablet Take 1 tablet (15 mg total) by mouth at bedtime. 02/03/16   Jaclyn Shaggy, MD  Multiple Vitamins-Minerals (MULTIVITAMIN WITH MINERALS) tablet Take 1 tablet by mouth daily.    Historical Provider, MD  nicotine (NICODERM CQ - DOSED IN MG/24 HOURS) 14 mg/24hr patch Place 1 patch (14 mg total) onto the skin daily. Patient not taking: Reported on 06/25/2016  06/24/16   Nishant Dhungel, MD  oxyCODONE-acetaminophen (PERCOCET/ROXICET) 5-325 MG tablet Take 2 tablets by mouth every 4 (four) hours as needed. 08/16/16   Rolland Porter, MD  Pancrelipase, Lip-Prot-Amyl, (ZENPEP) 25000 units CPEP Take 25,000 Units by mouth 3 (three) times daily before meals. 06/26/16   Jaclyn Shaggy, MD  Pancrelipase, Lip-Prot-Amyl, (ZENPEP) 40000-136000 units CPEP 40,000 by mouth 3 times daily 07/29/16   Jaclyn Shaggy, MD  TRUEPLUS LANCETS 28G MISC USE TO CHECK SUGARS 3 TIMES PER DAY 06/25/16   Jaclyn Shaggy, MD    Family History Family History  Problem Relation Age of Onset  . Diabetes Mother   . Heart disease Mother   . Hypertension Mother   . Stroke Mother   . Vision loss Mother   . Heart disease Father   . Hypertension Father   . Anesthesia problems Sister     post-op N/V    Social History Social History  Substance Use Topics  . Smoking status: Current Every Day  Smoker    Packs/day: 0.50    Years: 10.00    Types: Cigarettes  . Smokeless tobacco: Never Used  . Alcohol use No     Comment: last Etoh was 12/20/2015     Allergies   Ativan [lorazepam]   Review of Systems Review of Systems  Constitutional: Negative for appetite change, chills, diaphoresis, fatigue and fever.  HENT: Negative for mouth sores, sore throat and trouble swallowing.   Eyes: Negative for visual disturbance.  Respiratory: Negative for cough, chest tightness, shortness of breath and wheezing.   Cardiovascular: Negative for chest pain.  Gastrointestinal: Negative for abdominal distention, abdominal pain, diarrhea, nausea and vomiting.  Endocrine: Negative for polydipsia, polyphagia and polyuria.  Genitourinary: Negative for dysuria, frequency and hematuria.  Musculoskeletal: Negative for gait problem.       Right hip pain and redness  Skin: Negative for color change, pallor and rash.  Neurological: Negative for dizziness, syncope, light-headedness and headaches.  Hematological: Does not  bruise/bleed easily.  Psychiatric/Behavioral: Negative for behavioral problems and confusion.     Physical Exam Updated Vital Signs BP 107/73 (BP Location: Right Arm)   Pulse 93   Temp 98.4 F (36.9 C) (Oral)   Resp 17   SpO2 98%   Physical Exam  Constitutional: He is oriented to person, place, and time. He appears well-developed and well-nourished. No distress.  HENT:  Head: Normocephalic.  Eyes: Conjunctivae are normal. Pupils are equal, round, and reactive to light. No scleral icterus.  Neck: Normal range of motion. Neck supple. No thyromegaly present.  Cardiovascular: Normal rate and regular rhythm.  Exam reveals no gallop and no friction rub.   No murmur heard. Pulmonary/Chest: Effort normal and breath sounds normal. No respiratory distress. He has no wheezes. He has no rales.  Abdominal: Soft. Bowel sounds are normal. He exhibits no distension. There is no tenderness. There is no rebound.  Musculoskeletal: Normal range of motion.  Redness and tenderness isolated to the right hip over the greater trochanter. Limited bedside ultrasound does not show subcutaneous fluid collection. Warm to the touch. May be simple trochanteric bursitis. Perhaps overlying cellulitis. No indication for incision and drainage.  Neurological: He is alert and oriented to person, place, and time.  Skin: Skin is warm and dry. No rash noted.  Psychiatric: He has a normal mood and affect. His behavior is normal.     ED Treatments / Results  Labs (all labs ordered are listed, but only abnormal results are displayed) Labs Reviewed - No data to display  EKG  EKG Interpretation None       Radiology No results found.  Procedures Procedures (including critical care time)  Medications Ordered in ED Medications  oxyCODONE-acetaminophen (PERCOCET/ROXICET) 5-325 MG per tablet 2 tablet (not administered)  cephALEXin (KEFLEX) capsule 500 mg (not administered)     Initial Impression / Assessment  and Plan / ED Course  I have reviewed the triage vital signs and the nursing notes.  Pertinent labs & imaging results that were available during my care of the patient were reviewed by me and considered in my medical decision making (see chart for details).     She given Percocet for pain. Will start on Keflex. I have asked him to recheck with his primary care physician this week if not improving. Return to the ER with fevers, worsening, with worsening redness and pain, other worsening symptoms.  Final Clinical Impressions(s) / ED Diagnoses   Final diagnoses:  Trochanteric bursitis of right hip  Cellulitis of upper extremity, unspecified laterality    New Prescriptions New Prescriptions   CEPHALEXIN (KEFLEX) 500 MG CAPSULE    Take 1 capsule (500 mg total) by mouth 4 (four) times daily.   OXYCODONE-ACETAMINOPHEN (PERCOCET/ROXICET) 5-325 MG TABLET    Take 2 tablets by mouth every 4 (four) hours as needed.     Rolland PorterMark Talayia Hjort, MD 08/16/16 1202    Rolland PorterMark Londen Bok, MD 08/16/16 [phone]

## 2016-08-19 ENCOUNTER — Emergency Department (HOSPITAL_COMMUNITY): Payer: Medicaid Other | Admitting: Anesthesiology

## 2016-08-19 ENCOUNTER — Inpatient Hospital Stay (HOSPITAL_COMMUNITY)
Admission: EM | Admit: 2016-08-19 | Discharge: 2016-08-25 | DRG: 500 | Disposition: A | Discharge status: Home Health | Payer: Medicaid Other | Attending: Surgery | Admitting: Surgery

## 2016-08-19 ENCOUNTER — Encounter (HOSPITAL_COMMUNITY): Admission: EM | Disposition: A | Discharge status: Home Health | Payer: Self-pay | Source: Home / Self Care

## 2016-08-19 ENCOUNTER — Emergency Department (HOSPITAL_COMMUNITY): Payer: Medicaid Other

## 2016-08-19 ENCOUNTER — Encounter (HOSPITAL_COMMUNITY): Payer: Self-pay | Admitting: *Deleted

## 2016-08-19 ENCOUNTER — Other Ambulatory Visit: Payer: Self-pay | Admitting: Family Medicine

## 2016-08-19 DIAGNOSIS — F329 Major depressive disorder, single episode, unspecified: Secondary | ICD-10-CM | POA: Diagnosis present

## 2016-08-19 DIAGNOSIS — R64 Cachexia: Secondary | ICD-10-CM | POA: Diagnosis present

## 2016-08-19 DIAGNOSIS — E43 Unspecified severe protein-calorie malnutrition: Secondary | ICD-10-CM | POA: Diagnosis present

## 2016-08-19 DIAGNOSIS — E1165 Type 2 diabetes mellitus with hyperglycemia: Secondary | ICD-10-CM | POA: Diagnosis present

## 2016-08-19 DIAGNOSIS — Y92000 Kitchen of unspecified non-institutional (private) residence as  the place of occurrence of the external cause: Secondary | ICD-10-CM

## 2016-08-19 DIAGNOSIS — L0231 Cutaneous abscess of buttock: Secondary | ICD-10-CM | POA: Diagnosis present

## 2016-08-19 DIAGNOSIS — M25551 Pain in right hip: Secondary | ICD-10-CM | POA: Diagnosis present

## 2016-08-19 DIAGNOSIS — F1010 Alcohol abuse, uncomplicated: Secondary | ICD-10-CM | POA: Diagnosis present

## 2016-08-19 DIAGNOSIS — M726 Necrotizing fasciitis: Secondary | ICD-10-CM | POA: Diagnosis not present

## 2016-08-19 DIAGNOSIS — B961 Klebsiella pneumoniae [K. pneumoniae] as the cause of diseases classified elsewhere: Secondary | ICD-10-CM | POA: Diagnosis present

## 2016-08-19 DIAGNOSIS — W19XXXA Unspecified fall, initial encounter: Secondary | ICD-10-CM | POA: Diagnosis present

## 2016-08-19 DIAGNOSIS — F1110 Opioid abuse, uncomplicated: Secondary | ICD-10-CM | POA: Diagnosis present

## 2016-08-19 DIAGNOSIS — E119 Type 2 diabetes mellitus without complications: Secondary | ICD-10-CM | POA: Diagnosis not present

## 2016-08-19 DIAGNOSIS — Z6820 Body mass index (BMI) 20.0-20.9, adult: Secondary | ICD-10-CM | POA: Diagnosis not present

## 2016-08-19 DIAGNOSIS — E1142 Type 2 diabetes mellitus with diabetic polyneuropathy: Secondary | ICD-10-CM | POA: Diagnosis not present

## 2016-08-19 DIAGNOSIS — K860 Alcohol-induced chronic pancreatitis: Secondary | ICD-10-CM | POA: Diagnosis present

## 2016-08-19 DIAGNOSIS — E162 Hypoglycemia, unspecified: Secondary | ICD-10-CM | POA: Diagnosis not present

## 2016-08-19 DIAGNOSIS — Z794 Long term (current) use of insulin: Secondary | ICD-10-CM

## 2016-08-19 DIAGNOSIS — B1920 Unspecified viral hepatitis C without hepatic coma: Secondary | ICD-10-CM | POA: Diagnosis present

## 2016-08-19 DIAGNOSIS — M60051 Infective myositis, right thigh: Secondary | ICD-10-CM | POA: Diagnosis present

## 2016-08-19 DIAGNOSIS — E118 Type 2 diabetes mellitus with unspecified complications: Secondary | ICD-10-CM

## 2016-08-19 DIAGNOSIS — Z833 Family history of diabetes mellitus: Secondary | ICD-10-CM

## 2016-08-19 DIAGNOSIS — E1100 Type 2 diabetes mellitus with hyperosmolarity without nonketotic hyperglycemic-hyperosmolar coma (NKHHC): Secondary | ICD-10-CM | POA: Diagnosis not present

## 2016-08-19 DIAGNOSIS — M1990 Unspecified osteoarthritis, unspecified site: Secondary | ICD-10-CM | POA: Diagnosis present

## 2016-08-19 DIAGNOSIS — M60009 Infective myositis, unspecified site: Secondary | ICD-10-CM

## 2016-08-19 DIAGNOSIS — L0291 Cutaneous abscess, unspecified: Secondary | ICD-10-CM | POA: Diagnosis not present

## 2016-08-19 DIAGNOSIS — F17210 Nicotine dependence, cigarettes, uncomplicated: Secondary | ICD-10-CM | POA: Diagnosis present

## 2016-08-19 DIAGNOSIS — E1129 Type 2 diabetes mellitus with other diabetic kidney complication: Secondary | ICD-10-CM

## 2016-08-19 HISTORY — PX: INCISION AND DRAINAGE ABSCESS: SHX5864

## 2016-08-19 HISTORY — PX: IRRIGATION AND DEBRIDEMENT ABSCESS: SHX5252

## 2016-08-19 LAB — BASIC METABOLIC PANEL
Anion gap: 13 (ref 5–15)
BUN: 10 mg/dL (ref 6–20)
CO2: 22 mmol/L (ref 22–32)
Calcium: 8.5 mg/dL — ABNORMAL LOW (ref 8.9–10.3)
Chloride: 98 mmol/L — ABNORMAL LOW (ref 101–111)
Creatinine, Ser: 1.07 mg/dL (ref 0.61–1.24)
GFR calc Af Amer: >60 mL/min (ref >60)
GLUCOSE: 276 mg/dL — AB (ref 65–99)
POTASSIUM: 3.8 mmol/L (ref 3.5–5.1)
Sodium: 133 mmol/L — ABNORMAL LOW (ref 135–145)

## 2016-08-19 LAB — HEPATIC FUNCTION PANEL
ALK PHOS: 64 U/L (ref 38–126)
ALT: 11 U/L — ABNORMAL LOW (ref 17–63)
AST: 23 U/L (ref 15–41)
Albumin: 2.2 g/dL — ABNORMAL LOW (ref 3.5–5.0)
BILIRUBIN DIRECT: 0.2 mg/dL (ref 0.1–0.5)
BILIRUBIN TOTAL: 0.6 mg/dL (ref 0.3–1.2)
Indirect Bilirubin: 0.4 mg/dL (ref 0.3–0.9)
Total Protein: 6.3 g/dL — ABNORMAL LOW (ref 6.5–8.1)

## 2016-08-19 LAB — CBC
HCT: 34.6 % — ABNORMAL LOW (ref 39.0–52.0)
Hemoglobin: 11.6 g/dL — ABNORMAL LOW (ref 13.0–17.0)
MCH: 25.7 pg — AB (ref 26.0–34.0)
MCHC: 33.5 g/dL (ref 30.0–36.0)
MCV: 76.5 fL — AB (ref 78.0–100.0)
PLATELETS: 179 10*3/uL (ref 150–400)
RBC: 4.52 MIL/uL (ref 4.22–5.81)
RDW: 13.1 % (ref 11.5–15.5)
WBC: 6.9 10*3/uL (ref 4.0–10.5)

## 2016-08-19 LAB — PROTIME-INR
INR: 1.22
Prothrombin Time: 15.5 seconds — ABNORMAL HIGH (ref 11.4–15.2)

## 2016-08-19 LAB — CBG MONITORING, ED: Glucose-Capillary: 199 mg/dL — ABNORMAL HIGH (ref 65–99)

## 2016-08-19 LAB — GLUCOSE, CAPILLARY: GLUCOSE-CAPILLARY: 183 mg/dL — AB (ref 65–99)

## 2016-08-19 SURGERY — INCISION AND DRAINAGE, ABSCESS
Anesthesia: General | Site: Buttocks | Laterality: Right

## 2016-08-19 MED ORDER — HYDROMORPHONE HCL 1 MG/ML IJ SOLN
INTRAMUSCULAR | Status: AC
  Filled 2016-08-19: qty 0.5

## 2016-08-19 MED ORDER — MIRTAZAPINE 15 MG PO TABS
15.0000 mg | ORAL_TABLET | Freq: Every day | ORAL | Status: DC
  Administered 2016-08-20 – 2016-08-24 (×5): 15 mg via ORAL
  Filled 2016-08-19 (×7): qty 1

## 2016-08-19 MED ORDER — PROPOFOL 10 MG/ML IV BOLUS
INTRAVENOUS | Status: AC
  Filled 2016-08-19: qty 20

## 2016-08-19 MED ORDER — HYDROMORPHONE HCL 1 MG/ML IJ SOLN
INTRAMUSCULAR | Status: AC
  Filled 2016-08-19: qty 1

## 2016-08-19 MED ORDER — OXYCODONE HCL 5 MG PO TABS
5.0000 mg | ORAL_TABLET | ORAL | Status: DC | PRN
  Administered 2016-08-19 – 2016-08-25 (×22): 10 mg via ORAL
  Filled 2016-08-19 (×22): qty 2

## 2016-08-19 MED ORDER — GADOBENATE DIMEGLUMINE 529 MG/ML IV SOLN
12.0000 mL | Freq: Once | INTRAVENOUS | Status: AC | PRN
  Administered 2016-08-19: 12 mL via INTRAVENOUS

## 2016-08-19 MED ORDER — ATORVASTATIN CALCIUM 40 MG PO TABS
40.0000 mg | ORAL_TABLET | Freq: Every day | ORAL | Status: DC
Start: 2016-08-20 — End: 2016-08-25
  Administered 2016-08-20 – 2016-08-25 (×6): 40 mg via ORAL
  Filled 2016-08-19 (×6): qty 1

## 2016-08-19 MED ORDER — LIDOCAINE 2% (20 MG/ML) 5 ML SYRINGE
INTRAMUSCULAR | Status: DC | PRN
  Administered 2016-08-19: 80 mg via INTRAVENOUS

## 2016-08-19 MED ORDER — PANCRELIPASE (LIP-PROT-AMYL) 12000-38000 UNITS PO CPEP
24000.0000 [IU] | ORAL_CAPSULE | Freq: Three times a day (TID) | ORAL | Status: DC
  Administered 2016-08-20 – 2016-08-25 (×17): 24000 [IU] via ORAL
  Filled 2016-08-19 (×17): qty 2

## 2016-08-19 MED ORDER — SODIUM CHLORIDE 0.9 % IR SOLN
Status: DC | PRN
  Administered 2016-08-19: 3000 mL

## 2016-08-19 MED ORDER — 0.9 % SODIUM CHLORIDE (POUR BTL) OPTIME
TOPICAL | Status: DC | PRN
  Administered 2016-08-19: 1000 mL

## 2016-08-19 MED ORDER — MIDAZOLAM HCL 2 MG/2ML IJ SOLN
INTRAMUSCULAR | Status: AC
  Filled 2016-08-19: qty 2

## 2016-08-19 MED ORDER — SUFENTANIL CITRATE 50 MCG/ML IV SOLN
INTRAVENOUS | Status: AC
Start: 2016-08-19 — End: 2016-08-19
  Filled 2016-08-19: qty 1

## 2016-08-19 MED ORDER — VANCOMYCIN HCL IN DEXTROSE 1-5 GM/200ML-% IV SOLN
1000.0000 mg | Freq: Once | INTRAVENOUS | Status: AC
  Administered 2016-08-19: 1000 mg via INTRAVENOUS
  Filled 2016-08-19: qty 200

## 2016-08-19 MED ORDER — PIPERACILLIN-TAZOBACTAM 3.375 G IVPB 30 MIN
3.3750 g | Freq: Once | INTRAVENOUS | Status: AC
  Administered 2016-08-19: 3.375 g via INTRAVENOUS
  Filled 2016-08-19: qty 50

## 2016-08-19 MED ORDER — OXYCODONE HCL 5 MG PO TABS
ORAL_TABLET | ORAL | Status: AC
  Filled 2016-08-19: qty 2

## 2016-08-19 MED ORDER — SODIUM CHLORIDE 0.9 % IV SOLN
INTRAVENOUS | Status: DC
  Administered 2016-08-19 – 2016-08-21 (×3): via INTRAVENOUS

## 2016-08-19 MED ORDER — SUCCINYLCHOLINE CHLORIDE 200 MG/10ML IV SOSY
PREFILLED_SYRINGE | INTRAVENOUS | Status: DC | PRN
  Administered 2016-08-19: 100 mg via INTRAVENOUS

## 2016-08-19 MED ORDER — LACTATED RINGERS IV SOLN
INTRAVENOUS | Status: DC | PRN
  Administered 2016-08-19 (×2): via INTRAVENOUS

## 2016-08-19 MED ORDER — HYDROMORPHONE HCL 2 MG/ML IJ SOLN
1.0000 mg | Freq: Once | INTRAMUSCULAR | Status: AC
  Administered 2016-08-19: 1 mg via INTRAVENOUS
  Filled 2016-08-19: qty 1

## 2016-08-19 MED ORDER — GABAPENTIN 300 MG PO CAPS
300.0000 mg | ORAL_CAPSULE | Freq: Every day | ORAL | Status: DC
  Administered 2016-08-20 – 2016-08-24 (×5): 300 mg via ORAL
  Filled 2016-08-19 (×5): qty 1

## 2016-08-19 MED ORDER — PROPOFOL 10 MG/ML IV BOLUS
INTRAVENOUS | Status: DC | PRN
  Administered 2016-08-19: 120 mg via INTRAVENOUS

## 2016-08-19 MED ORDER — HYDROMORPHONE HCL 1 MG/ML IJ SOLN
0.2500 mg | INTRAMUSCULAR | Status: DC | PRN
  Administered 2016-08-19 (×3): 0.5 mg via INTRAVENOUS

## 2016-08-19 MED ORDER — SUFENTANIL CITRATE 50 MCG/ML IV SOLN
INTRAVENOUS | Status: DC | PRN
  Administered 2016-08-19 (×3): 10 ug via INTRAVENOUS

## 2016-08-19 MED ORDER — HYDROMORPHONE HCL 2 MG/ML IJ SOLN
2.0000 mg | Freq: Once | INTRAMUSCULAR | Status: AC
  Administered 2016-08-19: 2 mg via INTRAMUSCULAR
  Filled 2016-08-19: qty 1

## 2016-08-19 MED FILL — $LANTUS SOLOSTAR 100 UNITS/: 100 | 28 days supply | Qty: 6 | Fill #5

## 2016-08-19 SURGICAL SUPPLY — 39 items
BLADE CLIPPER SURG (BLADE) IMPLANT
BLADE SURG 10 STRL SS (BLADE) ×2 IMPLANT
BLADE SURG 15 STRL LF DISP TIS (BLADE) IMPLANT
BLADE SURG 15 STRL SS (BLADE) ×3
BNDG GAUZE ELAST 4 BULKY (GAUZE/BANDAGES/DRESSINGS) ×3 IMPLANT
CHLORAPREP W/TINT 26ML (MISCELLANEOUS) ×2 IMPLANT
COVER SURGICAL LIGHT HANDLE (MISCELLANEOUS) ×3 IMPLANT
DRAPE LAPAROSCOPIC ABDOMINAL (DRAPES) IMPLANT
DRAPE LAPAROTOMY 100X72 PEDS (DRAPES) ×3 IMPLANT
DRAPE UTILITY XL STRL (DRAPES) ×2 IMPLANT
DRSG PAD ABDOMINAL 8X10 ST (GAUZE/BANDAGES/DRESSINGS) ×6 IMPLANT
ELECT CAUTERY BLADE 6.4 (BLADE) ×3 IMPLANT
ELECT REM PT RETURN 9FT ADLT (ELECTROSURGICAL) ×3
ELECTRODE REM PT RTRN 9FT ADLT (ELECTROSURGICAL) ×1 IMPLANT
GAUZE SPONGE 4X4 12PLY STRL (GAUZE/BANDAGES/DRESSINGS) IMPLANT
GAUZE SPONGE 4X4 16PLY XRAY LF (GAUZE/BANDAGES/DRESSINGS) ×2 IMPLANT
GLOVE BIO SURGEON STRL SZ7 (GLOVE) ×3 IMPLANT
GLOVE BIOGEL PI IND STRL 7.5 (GLOVE) ×1 IMPLANT
GLOVE BIOGEL PI INDICATOR 7.5 (GLOVE) ×2
GOWN STRL REUS W/ TWL LRG LVL3 (GOWN DISPOSABLE) ×2 IMPLANT
GOWN STRL REUS W/TWL LRG LVL3 (GOWN DISPOSABLE) ×6
HANDPIECE INTERPULSE COAX TIP (DISPOSABLE) ×3
KIT BASIN OR (CUSTOM PROCEDURE TRAY) ×3 IMPLANT
KIT ROOM TURNOVER OR (KITS) ×3 IMPLANT
MANIFOLD NEPTUNE WASTE (CANNULA) ×2 IMPLANT
NS IRRIG 1000ML POUR BTL (IV SOLUTION) ×3 IMPLANT
PACK SURGICAL SETUP 50X90 (CUSTOM PROCEDURE TRAY) ×3 IMPLANT
PAD ARMBOARD 7.5X6 YLW CONV (MISCELLANEOUS) ×7 IMPLANT
PENCIL BUTTON HOLSTER BLD 10FT (ELECTRODE) ×3 IMPLANT
SET HNDPC FAN SPRY TIP SCT (DISPOSABLE) IMPLANT
SPONGE LAP 18X18 X RAY DECT (DISPOSABLE) ×6 IMPLANT
SWAB COLLECTION DEVICE MRSA (MISCELLANEOUS) IMPLANT
TAPE CLOTH SURG 6X10 WHT LF (GAUZE/BANDAGES/DRESSINGS) ×2 IMPLANT
TOWEL OR 17X24 6PK STRL BLUE (TOWEL DISPOSABLE) ×3 IMPLANT
TOWEL OR 17X26 10 PK STRL BLUE (TOWEL DISPOSABLE) ×3 IMPLANT
TUBE ANAEROBIC SPECIMEN COL (MISCELLANEOUS) IMPLANT
TUBE CONNECTING 12'X1/4 (SUCTIONS) ×1
TUBE CONNECTING 12X1/4 (SUCTIONS) ×2 IMPLANT
YANKAUER SUCT BULB TIP NO VENT (SUCTIONS) ×3 IMPLANT

## 2016-08-19 NOTE — ED Provider Notes (Signed)
5:41 PM Care assumed from Dr. Patria Maneampos.  Transfer care, patient is in the MRI scanner awaiting MRI of the right hip. Patient had a recent fall and has developed pain and swelling on the right hip. Patient had imaging showing concern for abscess. Patient will have MRI to look for osteomyelitis or worsened infection. Patient is already received antibiotic. Anticipate admission and possible consultation for further management of the abscess.  MRI returned showing a large abscess with pyomyositis. General surgery called.   General surgery will take the patient to the operating room for surgical exploration debridement and management. Pt required more pain medicine. This was provided.   Patient taken to the operating room in stable condition for further management.    Clinical Impression: 1. Abscess   2. Right hip pain   3. Pyomyositis     Disposition: Admit to Surgery    Heide Scaleshristopher J Tegeler, MD 08/19/16 [phone]

## 2016-08-19 NOTE — ED Notes (Signed)
Pt still in MRI 

## 2016-08-19 NOTE — Transfer of Care (Signed)
Immediate Anesthesia Transfer of Care Note  Patient: Raymond Winters  Procedure(s) Performed: Procedure(s): DEBRIDEMENT RIGHT BUTTOCK  ABSCESS (Right)  Patient Location: PACU  Anesthesia Type:General  Level of Consciousness: awake, alert  and oriented  Airway & Oxygen Therapy: Patient Spontanous Breathing  Post-op Assessment: Report given to RN and Post -op Vital signs reviewed and stable  Post vital signs: Reviewed and stable  Last Vitals:  Vitals:   08/19/16 2100 08/19/16 2315  BP: 129/84 (!) (P) 129/95  Pulse:  (!) (P) 103  Resp:    Temp:  (P) 36.5 C    Last Pain:  Vitals:   08/19/16 2315  TempSrc:   PainSc: (P) 8          Complications: No apparent anesthesia complications

## 2016-08-19 NOTE — Op Note (Signed)
Preop diagnosis: Right buttock abscess Postop diagnosis:  Necrotizing fasciitis right buttock Procedure:  Incision and debridement of right buttock necrotizing fasciitis - skin, subcutaneous tissue, fascia, muscle - total wound area 20 x 10 cm x 3 cm deep Surgeon:  Lisset Ketchem K. Anesthesia:  Gen Indications:  This is a 56 year old male with a history of substance abuse who continues to abuse heroin who presents after falling down a week ago. Apparently he developed a hematoma in his right buttock. This has become very inflamed and erythematous. He returned to the emergency department today for evaluation. He was noted to have a very large soft tissue infection with possible involvement of the underlying gluteus muscle. He presents now for emergent debridement.  Description of procedure: The patient brought to the operating room and placed in a supine position on the stretcher. After an adequate level of general anesthesia was obtained, he was moved to a prone position on the table. His right buttocks was exposed. We prepped the buttock with ChloraPrep and draped in sterile fashion. A timeout was taken to ensure the proper patient and proper procedure. There is a central area of fluctuance measuring about 5 cm. I incised into this fluctuant area and we immediately encountered a large amount of foul-smelling purulent fluid. We cultured this. We then widened our incision and we entered a large abscess cavity. Copious amounts of purulent fluid was suctioned out of this abscess cavity. I then explored the wound with my finger. The wound tracks extensively superiorly and medially. We continued extending our skin incision taking the skin and subcutaneous tissue overlying the abscess cavity. We then opened the entire wound to expose entire abscess cavity. We debrided a large amount of necrotic tissue using cautery. This included some of the underlying muscle and the fascia. We debrided all of the visible necrotic  tissue. There was no further purulent drainage noted. We then irrigated the entire wound with 3 L of normal saline using pulse lavage. Hemostasis was obtained using the cautery. We then packed the wound with an entire roll of saline moistened Kerlix gauze. A dry dressing was applied. The patient was then moved back to supine position on the stretcher. He was extubated and brought to the recovery room in stable condition. All sponge, instrument, and needle counts are correct.  Wilmon ArmsMatthew K. Corliss Skainssuei, MD, [facility]  General/ Trauma Surgery  08/19/2016 11:23 PM

## 2016-08-19 NOTE — Anesthesia Procedure Notes (Signed)
Procedure Name: Intubation Date/Time: 08/19/2016 10:00 PM Performed by: Claris Che Pre-anesthesia Checklist: Patient identified, Emergency Drugs available, Suction available, Patient being monitored and Timeout performed Patient Re-evaluated:Patient Re-evaluated prior to inductionOxygen Delivery Method: Circle system utilized Preoxygenation: Pre-oxygenation with 100% oxygen Intubation Type: IV induction, Rapid sequence and Cricoid Pressure applied Ventilation: Mask ventilation without difficulty Laryngoscope Size: Mac and 4 Grade View: Grade III Tube type: Oral Laser Tube: Cuffed inflated with minimal occlusive pressure - saline Number of attempts: 2 Airway Equipment and Method: Stylet Placement Confirmation: ETT inserted through vocal cords under direct vision,  positive ETCO2 and breath sounds checked- equal and bilateral Secured at: 24 cm Tube secured with: Tape Dental Injury: Teeth and Oropharynx as per pre-operative assessment

## 2016-08-19 NOTE — ED Triage Notes (Signed)
Pt states he slipped and fell last Wed, landing on his R hip.  [facility] and was told he had something like "bursitis".  States the pain meds he was prescribed lasted only 1 days.  He has been taking his keflex as prescribed.

## 2016-08-19 NOTE — ED Provider Notes (Addendum)
[facility]-EMERGENCY DEPT Provider Note   CSN: [id number] Arrival date & time: 08/19/16  1305     History   Chief Complaint Chief Complaint  Patient presents with  . Hip Pain    HPI Raymond Winters is a 56 y.o. male.  HPI Patient presents to the emergency department with complaints of ongoing and worsening right hip pain.  Patient was seen in the emergency department on February 25 was diagnosed with possible olecranon bursitis started on Keflex.  He states worsening and ongoing pain in his right hip to the point where he can't even lay or sit on the right side of the hip.  He does report that the symptoms began shortly after injuring his right hip with a fall in the kitchen one week ago.  He's been taking his Keflex as prescribed.  He denies fevers or chills at home.  He was given Percocet that time which transiently helped his pain but he was only given a short course and therefore reports that his pain is back.   Past Medical History:  Diagnosis Date  . Alcohol dependence (HCC) 01/05/2016  . Alcoholic liver disease, unspecified 01/05/2016  . Arthritis    shoulders  . Chronic alcoholic pancreatitis (HCC) 01/05/2016  . Depression   . Family history of adverse reaction to anesthesia    pt's sister has hx. of post-op N/V  . Hepatitis C   . Heroin abuse   . High cholesterol   . Hypoalbuminemia due to protein-calorie malnutrition (HCC) 01/05/2016  . Insulin dependent diabetes mellitus (HCC)   . Lactic acid increased, CHRONIC 01/05/2016   Secondary to Liver Disease, Alcoholism, and Diabetes  . 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 Shoulder wound 01/2016   left    Patient Active Problem List   Diagnosis Date Noted  . Hypoglycemia 06/22/2016  . Depression 02/03/2016  . Chronic hepatitis C (HCC)   . Alcoholic liver disease, unspecified 01/05/2016  . Alcohol dependence (HCC) 01/05/2016  . Chronic alcoholic pancreatitis (HCC) 01/05/2016  . Hypoalbuminemia due to  protein-calorie malnutrition (HCC) 01/05/2016  . Lactic acid increased, CHRONIC 01/05/2016  . Diarrhea 01/04/2016  . Chronic diarrhea 01/04/2016  . Pressure ulcer 01/04/2016  . Uncontrolled type 2 diabetes mellitus with complication, with long-term current use of insulin (HCC)   . Swelling   . Type 2 diabetes mellitus with complication, with long-term current use of insulin (HCC)   . Septic joint of left shoulder region (HCC)   . Abscess 12/24/2015  . Abscess of left shoulder   . Leukocytosis   . Type 2 diabetes mellitus with complication, without long-term current use of insulin (HCC)   . Glenohumeral arthritis 07/24/2015  . Left shoulder pain 07/24/2015  . 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 Tobacco abuse 07/26/2014  . Protein-calorie malnutrition, severe (HCC) 12/05/2013  . Hyperkalemia 12/04/2013  . Heroin withdrawal (HCC) 12/04/2013  . Alcohol abuse 12/04/2013  . Hyperglycemia 12/04/2013  . Metabolic acidosis with respiratory alkalosis 12/04/2013  . Heroin abuse 04/04/2013  . DM2 (diabetes mellitus, type 2) (HCC) 03/20/2013    Past Surgical History:  Procedure Laterality Date  . APPLICATION OF WOUND VAC Left 12/24/2015   Procedure: APPLICATION OF WOUND VAC; LEFT SHOULDER;  Surgeon: Tarry Kos, MD;  Location: [facility] OR;  Service: Orthopedics;  Laterality: Left;  . APPLICATION OF WOUND VAC Left 01/29/2016   Procedure: LEFT SHOULDER SPLIT THICKNESS SKIN GRAFT, WOUND VAC;  Surgeon: Tarry Kos, MD;  Location: [facility];  Service: Orthopedics;  Laterality: Left;  . I&D EXTREMITY Right 03/20/2013   Procedure: IRRIGATION AND DEBRIDEMENT EXTREMITY;  Surgeon: Marlowe Shores, MD;  Location: [facility] OR;  Service: Orthopedics;  Laterality: Right;  . I&D EXTREMITY Left 12/24/2015   Procedure:  DEBRIDEMENT OF MUSCLE, SUBCUTANEOUS TISSUE LEFT SHOULDER;  Surgeon: Tarry Kos, MD;  Location: [facility];  Service: Orthopedics;  Laterality: Left;  . IRRIGATION AND DEBRIDEMENT SHOULDER Left  12/27/2015   Procedure: IRRIGATION AND DEBRIDEMENT LEFT SHOULDER;  Surgeon: Tarry Kos, MD;  Location: [facility];  Service: Orthopedics;  Laterality: Left;  . IRRIGATION AND DEBRIDEMENT SHOULDER Left 12/30/2015   Procedure: IRRIGATION AND DEBRIDEMENT LEFT SHOULDER;  Surgeon: Tarry Kos, MD;  Location: [facility];  Service: Orthopedics;  Laterality: Left;  . SKIN SPLIT GRAFT Left 01/29/2016   Procedure: SKIN GRAFT SPLIT THICKNESS,;  Surgeon: Tarry Kos, MD;  Location: [facility];  Service: Orthopedics;  Laterality: Left;       Home Medications    Prior to Admission medications   Medication Sig Start Date End Date Taking? Authorizing Provider  aspirin EC 81 MG tablet Take 1 tablet (81 mg total) by mouth daily. Patient not taking: Reported on 06/25/2016 04/04/14   Ambrose Finland, NP  atorvastatin (LIPITOR) 40 MG tablet Take 1 tablet (40 mg total) by mouth daily. 06/25/16   Jaclyn Shaggy, MD  Blood Glucose Monitoring Suppl (TRUE METRIX METER) DEVI 1 each by Does not apply route 3 (three) times daily before meals. 06/25/16   Jaclyn Shaggy, MD  cephALEXin (KEFLEX) 500 MG capsule Take 1 capsule (500 mg total) by mouth 4 (four) times daily. 08/16/16   Rolland Porter, MD  gabapentin (NEURONTIN) 300 MG capsule TAKE 1 CAPSULE BY MOUTH AT BEDTIME FOR DIABETIC NERVE PAIN 06/25/16   Jaclyn Shaggy, MD  glucose blood (TRUE METRIX BLOOD GLUCOSE TEST) test strip Use 3 times daily before meals 06/25/16   Jaclyn Shaggy, MD  ibuprofen (ADVIL,MOTRIN) 800 MG tablet Take 1 tablet (800 mg total) by mouth every 12 (twelve) hours as needed. Patient taking differently: Take 800 mg by mouth every 12 (twelve) hours as needed for moderate pain.  02/03/16   Jaclyn Shaggy, MD  insulin aspart (NOVOLOG FLEXPEN) 100 UNIT/ML FlexPen 0-12 units before meals sliding scale as directed 06/29/16   Jaclyn Shaggy, MD  Insulin Glargine (LANTUS SOLOSTAR) 100 UNIT/ML Solostar Pen Inject 25 Units into the skin daily at 10 pm. 07/15/16   Jaclyn Shaggy, MD    meloxicam (MOBIC) 15 MG tablet Take 15 mg by mouth daily.    Historical Provider, MD  mirtazapine (REMERON) 15 MG tablet Take 1 tablet (15 mg total) by mouth at bedtime. 02/03/16   Jaclyn Shaggy, MD  Multiple Vitamins-Minerals (MULTIVITAMIN WITH MINERALS) tablet Take 1 tablet by mouth daily.    Historical Provider, MD  nicotine (NICODERM CQ - DOSED IN MG/24 HOURS) 14 mg/24hr patch Place 1 patch (14 mg total) onto the skin daily. Patient not taking: Reported on 06/25/2016 06/24/16   Nishant Dhungel, MD  oxyCODONE-acetaminophen (PERCOCET/ROXICET) 5-325 MG tablet Take 2 tablets by mouth every 4 (four) hours as needed. 08/16/16   Rolland Porter, MD  Pancrelipase, Lip-Prot-Amyl, (ZENPEP) 25000 units CPEP Take 25,000 Units by mouth 3 (three) times daily before meals. 06/26/16   Jaclyn Shaggy, MD  Pancrelipase, Lip-Prot-Amyl, (ZENPEP) 40000-136000 units CPEP 40,000 by mouth 3 times daily 07/29/16   Jaclyn Shaggy, MD  TRUEPLUS LANCETS 28G MISC USE TO CHECK  SUGARS 3 TIMES PER DAY 06/25/16   Jaclyn Shaggy, MD    Family History Family History  Problem Relation Age of Onset  . Diabetes Mother   . Heart disease Mother   . Hypertension Mother   . Stroke Mother   . Vision loss Mother   . Heart disease Father   . Hypertension Father   . Anesthesia problems Sister     post-op N/V    Social History Social History  Substance Use Topics  . Smoking status: Current Every Day Smoker    Packs/day: 0.50    Years: 10.00    Types: Cigarettes  . Smokeless tobacco: Never Used  . Alcohol use No     Comment: last Etoh was 12/20/2015     Allergies   Ativan [lorazepam]   Review of Systems Review of Systems  All other systems reviewed and are negative.    Physical Exam Updated Vital Signs BP 127/85   Pulse 83   Temp 99.6 F (37.6 C) (Oral)   Resp (!) 9   Ht 5\' 7"  (1.702 m)   Wt 130 lb (59 kg)   SpO2 100%   BMI 20.36 kg/m   Physical Exam  Constitutional: He is oriented to person, place, and time. He appears  well-developed and well-nourished.  HENT:  Head: Normocephalic.  Eyes: EOM are normal.  Neck: Normal range of motion.  Pulmonary/Chest: Effort normal.  Abdominal: He exhibits no distension.  Musculoskeletal: Normal range of motion.  Large area of fluctuance of the right lateral hip with overlying erythema and warmth.  No drainage.  Full range of motion of right hip  Neurological: He is alert and oriented to person, place, and time.  Psychiatric: He has a normal mood and affect.  Nursing note and vitals reviewed.    ED Treatments / Results  Labs (all labs ordered are listed, but only abnormal results are displayed) Labs Reviewed  CBC - Abnormal; Notable for the following:       Result Value   Hemoglobin 11.6 (*)    HCT 34.6 (*)    MCV 76.5 (*)    MCH 25.7 (*)    All other components within normal limits  BASIC METABOLIC PANEL - Abnormal; Notable for the following:    Sodium 133 (*)    Chloride 98 (*)    Glucose, Bld 276 (*)    Calcium 8.5 (*)    All other components within normal limits  CULTURE, BLOOD (ROUTINE X 2)  CULTURE, BLOOD (ROUTINE X 2)    EKG  EKG Interpretation None       Radiology Dg Hip Unilat  With Pelvis 2-3 Views Right  Result Date: 08/19/2016 CLINICAL DATA:  Pain and swelling following fall 1 week prior EXAM: DG HIP (WITH OR WITHOUT PELVIS) 2-3V RIGHT COMPARISON:  CT abdomen and pelvis with bony reformats September 15, 2015 FINDINGS: Frontal pelvis as well as frontal and lateral right hip images were obtained. There is no acute fracture or dislocation. Calcification lateral to the right acetabulum was present previously and may have arthropathic etiology or the result of old trauma in this area. There is soft tissue fullness lateral to the right hip joint with foci of air in this area. There is subcutaneous edema in this area lateral to the right hip joint and inferior right iliac crest. There is subtle cortical loss along the right inferolateral iliac crest.  The appearance in this area is concerning for soft tissue abscess causing localized bony destruction  along the inferolateral right iliac crest. No other bony destruction evident. IMPRESSION: Findings felt to represent soft tissue abscess lateral to the right hip joint and inferior right lateral iliac crest with apparent localized bony destruction along the inferolateral right iliac crest. This finding warrants CT of the pelvis, ideally with intravenous contrast, to further assess. It is difficult to exclude infection/inflammation extending into the right hip joint, although there is no widening of the right hip joint. No acute fracture or dislocation. These results were called by telephone at the time of interpretation on 08/19/2016 at 1:52 pm to Dr. Azalia BilisKEVIN Janelli Welling , who verbally acknowledged these results. Electronically Signed   By: Bretta BangWilliam  Woodruff III M.D.   On: 08/19/2016 13:52    Procedures Procedures (including critical care time)  Medications Ordered in ED Medications  vancomycin (VANCOCIN) IVPB 1000 mg/200 mL premix (not administered)  piperacillin-tazobactam (ZOSYN) IVPB 3.375 g (not administered)  HYDROmorphone (DILAUDID) injection 2 mg (2 mg Intramuscular Given 08/19/16 1521)     Initial Impression / Assessment and Plan / ED Course  I have reviewed the triage vital signs and the nursing notes.  Pertinent labs & imaging results that were available during my care of the patient were reviewed by me and considered in my medical decision making (see chart for details).    Patient undergo MRI of the right hip to further evaluate extension of the probable abscess.  Will need to define further if it truly involves the joint space and/or bone.  Externally there is a palpable fluctuant mass which will need incision and drainage.  This may be beneficial to be performed in the operating room.  vanc and Zosyn now.  Care to Dr Rush Landmarkegeler to follow up on MRI.    Final Clinical Impressions(s) / ED  Diagnoses   Final diagnoses:  Right hip pain    New Prescriptions New Prescriptions   No medications on file     Azalia BilisKevin Daleyza Gadomski, MD 08/19/16 [id number]    Azalia BilisKevin Aprel Egelhoff, MD 09/02/16 [phone]

## 2016-08-19 NOTE — Anesthesia Preprocedure Evaluation (Addendum)
Anesthesia Evaluation  Patient identified by MRN, date of birth, ID band Patient awake    Reviewed: Allergy & Precautions, H&P , NPO status , Patient's Chart, lab work & pertinent test results  Airway Mallampati: II  TM Distance: >3 FB Neck ROM: Full    Dental no notable dental hx. (+) Teeth Intact, Dental Advisory Given   Pulmonary Current Smoker,    Pulmonary exam normal breath sounds clear to auscultation       Cardiovascular negative cardio ROS   Rhythm:Regular Rate:Normal     Neuro/Psych Depression negative neurological ROS     GI/Hepatic negative GI ROS, (+)     substance abuse  alcohol use and IV drug use, Hepatitis -, C  Endo/Other  diabetes, Insulin Dependent  Renal/GU negative Renal ROS  negative genitourinary   Musculoskeletal  (+) Arthritis , Osteoarthritis,    Abdominal   Peds  Hematology negative hematology ROS (+)   Anesthesia Other Findings   Reproductive/Obstetrics negative OB ROS                            Anesthesia Physical Anesthesia Plan  ASA: III and emergent  Anesthesia Plan: General   Post-op Pain Management:    Induction: Intravenous, Rapid sequence and Cricoid pressure planned  Airway Management Planned: Oral ETT  Additional Equipment:   Intra-op Plan:   Post-operative Plan: Extubation in OR  Informed Consent: I have reviewed the patients History and Physical, chart, labs and discussed the procedure including the risks, benefits and alternatives for the proposed anesthesia with the patient or authorized representative who has indicated his/her understanding and acceptance.   Dental advisory given  Plan Discussed with: CRNA  Anesthesia Plan Comments:        Anesthesia Quick Evaluation

## 2016-08-19 NOTE — ED Notes (Signed)
Called MRI. Pt ready for transport

## 2016-08-19 NOTE — ED Notes (Signed)
Will page MRI at  [phone] when pt ready

## 2016-08-19 NOTE — H&P (Signed)
Raymond Winters is an 56 y.o. male.   Chief Complaint: Right buttock abscess HPI: 56 yo male with substance abuse presents after a fall last week.  He landed on his right hip and was laying there for about 30 minutes before his brother arrived to help him up.  He states that the area became bruised.  Subsequently, over the last couple of days, the area has become much more swollen, erythematous, and tender.  He remains afebrile.   He admits to "shooting up" with heroin about a week ago, just prior to the fall.  He claims that he no longer drinks alcohol.  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  Debridement and skin grafting by Dr. Erlinda Hong.  Past Medical History:  Diagnosis Date  . Alcohol dependence ([place]) 01/05/2016  . Alcoholic liver disease, unspecified 01/05/2016  . Arthritis    shoulders  . Chronic alcoholic pancreatitis ([place]) 01/05/2016  . Depression   . Family history of adverse reaction to anesthesia    pt's sister has hx. of post-op N/V  . Hepatitis C   . Heroin abuse   . High cholesterol   . Hypoalbuminemia due to protein-calorie malnutrition ([place]) 01/05/2016  . Insulin dependent diabetes mellitus ([place])   . Lactic acid increased, CHRONIC 01/05/2016   Secondary to Liver Disease, Alcoholism, and Diabetes  . 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 Shoulder wound 01/2016   left    Past Surgical History:  Procedure Laterality Date  . APPLICATION OF WOUND VAC Left 12/24/2015   Procedure: APPLICATION OF WOUND VAC; LEFT SHOULDER;  Surgeon: Leandrew Koyanagi, MD;  Location: [facility];  Service: Orthopedics;  Laterality: Left;  . APPLICATION OF WOUND VAC Left 01/29/2016   Procedure: LEFT SHOULDER SPLIT THICKNESS SKIN GRAFT, WOUND VAC;  Surgeon: Leandrew Koyanagi, MD;  Location: [facility];  Service: Orthopedics;  Laterality: Left;  . I&D EXTREMITY Right 03/20/2013   Procedure: IRRIGATION  AND DEBRIDEMENT EXTREMITY;  Surgeon: Schuyler Amor, MD;  Location: [facility];  Service: Orthopedics;  Laterality: Right;  . I&D EXTREMITY Left 12/24/2015   Procedure:  DEBRIDEMENT OF MUSCLE, SUBCUTANEOUS TISSUE LEFT SHOULDER;  Surgeon: Leandrew Koyanagi, MD;  Location: [facility];  Service: Orthopedics;  Laterality: Left;  . IRRIGATION AND DEBRIDEMENT SHOULDER Left 12/27/2015   Procedure: IRRIGATION AND DEBRIDEMENT LEFT SHOULDER;  Surgeon: Leandrew Koyanagi, MD;  Location: [facility];  Service: Orthopedics;  Laterality: Left;  . IRRIGATION AND DEBRIDEMENT SHOULDER Left 12/30/2015   Procedure: IRRIGATION AND DEBRIDEMENT LEFT SHOULDER;  Surgeon: Leandrew Koyanagi, MD;  Location: [facility];  Service: Orthopedics;  Laterality: Left;  . SKIN SPLIT GRAFT Left 01/29/2016   Procedure: SKIN GRAFT SPLIT THICKNESS,;  Surgeon: Leandrew Koyanagi, MD;  Location: [facility];  Service: Orthopedics;  Laterality: Left;    Family History  Problem Relation Age of Onset  . Diabetes Mother   . Heart disease Mother   . Hypertension Mother   . Stroke Mother   . Vision loss Mother   . Heart disease Father   . Hypertension Father   . Anesthesia problems Sister     post-op N/V   Social History:  reports that he has been smoking Cigarettes.  He has a 5.00 pack-year smoking history. He has never used smokeless tobacco. He reports that he uses drugs, including IV and Heroin. He reports that he  does not drink alcohol.  Allergies:  Allergies  Allergen Reactions  . Ativan [Lorazepam] Other (See Comments)    HALLUCINATIONS   Prior to Admission medications   Medication Sig Start Date End Date Taking? Authorizing Provider  aspirin EC 81 MG tablet Take 1 tablet (81 mg total) by mouth daily. Patient not taking: Reported on 06/25/2016 04/04/14   Lance Bosch, NP  atorvastatin (LIPITOR) 40 MG tablet Take 1 tablet (40 mg total) by mouth daily. 06/25/16   Arnoldo Morale, MD  Blood Glucose Monitoring Suppl (TRUE METRIX METER) DEVI 1 each by Does not  apply route 3 (three) times daily before meals. 06/25/16   Arnoldo Morale, MD  cephALEXin (KEFLEX) 500 MG capsule Take 1 capsule (500 mg total) by mouth 4 (four) times daily. 08/16/16   Tanna Furry, MD  gabapentin (NEURONTIN) 300 MG capsule TAKE 1 CAPSULE BY MOUTH AT BEDTIME FOR DIABETIC NERVE PAIN 06/25/16   Arnoldo Morale, MD  glucose blood (TRUE METRIX BLOOD GLUCOSE TEST) test strip Use 3 times daily before meals 06/25/16   Arnoldo Morale, MD  ibuprofen (ADVIL,MOTRIN) 800 MG tablet Take 1 tablet (800 mg total) by mouth every 12 (twelve) hours as needed. Patient taking differently: Take 800 mg by mouth every 12 (twelve) hours as needed for moderate pain.  02/03/16   Arnoldo Morale, MD  insulin aspart (NOVOLOG FLEXPEN) 100 UNIT/ML FlexPen 0-12 units before meals sliding scale as directed 06/29/16   Arnoldo Morale, MD  Insulin Glargine (LANTUS SOLOSTAR) 100 UNIT/ML Solostar Pen Inject 25 Units into the skin daily at 10 pm. 07/15/16   Arnoldo Morale, MD  meloxicam (MOBIC) 15 MG tablet Take 15 mg by mouth daily.    Historical Provider, MD  mirtazapine (REMERON) 15 MG tablet Take 1 tablet (15 mg total) by mouth at bedtime. 02/03/16   Arnoldo Morale, MD  Multiple Vitamins-Minerals (MULTIVITAMIN WITH MINERALS) tablet Take 1 tablet by mouth daily.    Historical Provider, MD  nicotine (NICODERM CQ - DOSED IN MG/24 HOURS) 14 mg/24hr patch Place 1 patch (14 mg total) onto the skin daily. Patient not taking: Reported on 06/25/2016 06/24/16   Nishant Dhungel, MD  oxyCODONE-acetaminophen (PERCOCET/ROXICET) 5-325 MG tablet Take 2 tablets by mouth every 4 (four) hours as needed. 08/16/16   Tanna Furry, MD  Pancrelipase, Lip-Prot-Amyl, (ZENPEP) 25000 units CPEP Take 25,000 Units by mouth 3 (three) times daily before meals. 06/26/16   Arnoldo Morale, MD  Pancrelipase, Lip-Prot-Amyl, (ZENPEP) 40000-136000 units CPEP 40,000 by mouth 3 times daily 07/29/16   Arnoldo Morale, MD  TRUEPLUS LANCETS 28G MISC USE TO CHECK SUGARS 3 TIMES PER DAY 06/25/16   Arnoldo Morale, MD     Results for orders placed or performed during the hospital encounter of 08/19/16 (from the past 48 hour(s))  CBC     Status: Abnormal   Collection Time: 08/19/16  3:46 PM  Result Value Ref Range   WBC 6.9 4.0 - 10.5 K/uL   RBC 4.52 4.22 - 5.81 MIL/uL   Hemoglobin 11.6 (L) 13.0 - 17.0 g/dL   HCT 34.6 (L) 39.0 - 52.0 %   MCV 76.5 (L) 78.0 - 100.0 fL   MCH 25.7 (L) 26.0 - 34.0 pg   MCHC 33.5 30.0 - 36.0 g/dL   RDW 13.1 11.5 - 15.5 %   Platelets 179 150 - 400 K/uL  Basic metabolic panel     Status: Abnormal   Collection Time: 08/19/16  3:46 PM  Result Value Ref Range   Sodium  133 (L) 135 - 145 mmol/L   Potassium 3.8 3.5 - 5.1 mmol/L   Chloride 98 (L) 101 - 111 mmol/L   CO2 22 22 - 32 mmol/L   Glucose, Bld 276 (H) 65 - 99 mg/dL   BUN 10 6 - 20 mg/dL   Creatinine, Ser 1.07 0.61 - 1.24 mg/dL   Calcium 8.5 (L) 8.9 - 10.3 mg/dL   GFR calc non Af Amer >60 >60 mL/min   GFR calc Af Amer >60 >60 mL/min    Comment: (NOTE) The eGFR has been calculated using the CKD EPI equation. This calculation has not been validated in all clinical situations. eGFR's persistently <60 mL/min signify possible Chronic Kidney Disease.    Anion gap 13 5 - 15  POC CBG, ED     Status: Abnormal   Collection Time: 08/19/16  8:25 PM  Result Value Ref Range   Glucose-Capillary 199 (H) 65 - 99 mg/dL   Mr Hip Right W Wo Contrast  Result Date: 08/19/2016 CLINICAL DATA:  Right hip and buttock pain, redness and swelling for 1 week. Prior history of fall. EXAM: MRI OF THE RIGHT FEMUR WITHOUT AND WITH CONTRAST; MRI OF THE RIGHT HIP WITHOUT AND WITH CONTRAST TECHNIQUE: Multiplanar, multisequence MR imaging of the right hip and femur. Was performed both before and after administration of intravenous contrast. CONTRAST:  17m MULTIHANCE GADOBENATE DIMEGLUMINE 529 MG/ML IV SOLN COMPARISON:  Radiographs 08/19/2016 FINDINGS: There is a large complex fluid collection in the right buttock area superficial to the  gluteus maximus muscle. This measures approximate 15 x 10 cm and contains gas. They could be a partially liquified hematoma that has become infected. After contrast administration there is extensive surrounding enhancement. There is also significant edema like signal abnormality in the adjacent gluteus maximus muscle which may be focal myositis or a muscle tear with secondary pyomyositis. Bilateral adductor muscle edema is likely related to muscle tears. Bilateral hip AVN is noted. However, I do not see any findings to suggest septic arthritis or osteomyelitis involving the hips or pelvis. No significant intrapelvic abnormalities are demonstrated. IMPRESSION: 1. Large complex gas containing fluid collection in the right buttock area most likely an infected hematoma/large abscess measuring approximately 15 x 10 cm. 2. Mass effect on the gluteus maximus muscle which demonstrates significant myositis and areas of pyomyositis. 3. 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 4. No findings for septic arthritis or osteomyelitis involving the pelvis, hips or right femur. 5. Bilateral adductor muscle edema, likely due to muscle strains or partial tears. Electronically Signed   By: PMarijo SanesM.D.   On: 08/19/2016 19:31   Mr Femur Right W Wo Contrast  Result Date: 08/19/2016 CLINICAL DATA:  Right hip and buttock pain, redness and swelling for 1 week. Prior history of fall. EXAM: MRI OF THE RIGHT FEMUR WITHOUT AND WITH CONTRAST; MRI OF THE RIGHT HIP WITHOUT AND WITH CONTRAST TECHNIQUE: Multiplanar, multisequence MR imaging of the right hip and femur. Was performed both before and after administration of intravenous contrast. CONTRAST:  142mMULTIHANCE GADOBENATE DIMEGLUMINE 529 MG/ML IV SOLN COMPARISON:  Radiographs 08/19/2016 FINDINGS: There is a large complex fluid collection in the right buttock area superficial to the gluteus maximus muscle. This measures approximate 15 x 10  cm and contains gas. They could be a partially liquified hematoma that has become infected. After contrast administration there is extensive surrounding enhancement. There is also significant edema like signal abnormality in the adjacent  gluteus maximus muscle which may be focal myositis or a muscle tear with secondary pyomyositis. Bilateral adductor muscle edema is likely related to muscle tears. Bilateral hip AVN is noted. However, I do not see any findings to suggest septic arthritis or osteomyelitis involving the hips or pelvis. No significant intrapelvic abnormalities are demonstrated. IMPRESSION: 1. Large complex gas containing fluid collection in the right buttock area most likely an infected hematoma/large abscess measuring approximately 15 x 10 cm. 2. Mass effect on the gluteus maximus muscle which demonstrates significant myositis and areas of pyomyositis. 3. 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 4. No findings for septic arthritis or osteomyelitis involving the pelvis, hips or right femur. 5. Bilateral adductor muscle edema, likely due to muscle strains or partial tears. Electronically Signed   By: Marijo Sanes M.D.   On: 08/19/2016 19:31   Dg Hip Unilat  With Pelvis 2-3 Views Right  Result Date: 08/19/2016 CLINICAL DATA:  Pain and swelling following fall 1 week prior EXAM: DG HIP (WITH OR WITHOUT PELVIS) 2-3V RIGHT COMPARISON:  CT abdomen and pelvis with bony reformats September 15, 2015 FINDINGS: Frontal pelvis as well as frontal and lateral right hip images were obtained. There is no acute fracture or dislocation. Calcification lateral to the right acetabulum was present previously and may have arthropathic etiology or the result of old trauma in this area. There is soft tissue fullness lateral to the right hip joint with foci of air in this area. There is subcutaneous edema in this area lateral to the right hip joint and inferior right iliac crest. There  is subtle cortical loss along the right inferolateral iliac crest. The appearance in this area is concerning for soft tissue abscess causing localized bony destruction along the inferolateral right iliac crest. No other bony destruction evident. IMPRESSION: Findings felt to represent soft tissue abscess lateral to the right hip joint and inferior right lateral iliac crest with apparent localized bony destruction along the inferolateral right iliac crest. This finding warrants CT of the pelvis, ideally with intravenous contrast, to further assess. It is difficult to exclude infection/inflammation extending into the right hip joint, although there is no widening of the right hip joint. No acute fracture or dislocation. These results were called by telephone at the time of interpretation on 08/19/2016 at 1:52 pm to Dr. Jola Schmidt , who verbally acknowledged these results. Electronically Signed   By: Lowella Grip III M.D.   On: 08/19/2016 13:52    Review of Systems  Constitutional: Negative for weight loss.  HENT: Negative for ear discharge, ear pain, hearing loss and tinnitus.   Eyes: Negative for blurred vision, double vision, photophobia and pain.  Respiratory: Negative for cough, sputum production and shortness of breath.   Cardiovascular: Negative for chest pain.  Gastrointestinal: Negative for abdominal pain, nausea and vomiting.  Genitourinary: Negative for dysuria, flank pain, frequency and urgency.  Musculoskeletal: Positive for falls, joint pain and myalgias. Negative for back pain and neck pain.  Neurological: Negative for dizziness, tingling, sensory change, focal weakness, loss of consciousness and headaches.  Endo/Heme/Allergies: Does not bruise/bleed easily.  Psychiatric/Behavioral: Positive for substance abuse. Negative for depression and memory loss. The patient is not nervous/anxious.     Blood pressure 114/78, pulse 73, temperature 99.6 F (37.6 C), temperature source Oral,  resp. rate (!) 9, height '5\' 7"'$  (1.702 m), weight 59 kg (130 lb), SpO2 100 %. Physical Exam  Very thin in NAD Eyes:  Pupils equal, round; sclera anicteric HENT:  Oral mucosa moist; good dentition  Neck:  No masses palpated, no thyromegaly Lungs:  CTA bilaterally; normal respiratory effort CV:  Regular rate and rhythm; no murmurs; extremities well-perfused with no edema Abd:  +bowel sounds, soft, non-tender, no palpable organomegaly; no palpable hernias Skin: right hip and buttock - erythematous, very tender; 5 cm central fluctuance; induration involving about 15 cm Psychiatric - alert and oriented x 4; calm mood and affect  Assessment/Plan Large soft tissue infection right hip/buttock - likely secondarily infected hematoma.  Possible involvement of underlying gluteus muscle.  Proceed to OR for emergent debridement of right buttock soft tissue infection.  The surgical procedure has been discussed with the patient.  Potential risks, benefits, alternative treatments, and expected outcomes have been explained.  All of the patient's questions at this time have been answered.  The likelihood of reaching the patient's treatment goal is good.  The patient understand the proposed surgical procedure and wishes to proceed.   Multiple medical comorbidities - substance abuse Check LFT's, INR due to chronic alcoholic liver disease  Nalani Andreen K., MD 08/19/2016, 9:03 PM

## 2016-08-20 ENCOUNTER — Encounter (HOSPITAL_COMMUNITY): Payer: Self-pay | Admitting: General Practice

## 2016-08-20 LAB — CBC
HEMATOCRIT: 30.4 % — AB (ref 39.0–52.0)
HEMOGLOBIN: 10 g/dL — AB (ref 13.0–17.0)
MCH: 24.9 pg — AB (ref 26.0–34.0)
MCHC: 32.9 g/dL (ref 30.0–36.0)
MCV: 75.8 fL — AB (ref 78.0–100.0)
Platelets: 158 10*3/uL (ref 150–400)
RBC: 4.01 MIL/uL — ABNORMAL LOW (ref 4.22–5.81)
RDW: 13 % (ref 11.5–15.5)
WBC: 6 10*3/uL (ref 4.0–10.5)

## 2016-08-20 LAB — BASIC METABOLIC PANEL
Anion gap: 8 (ref 5–15)
BUN: 7 mg/dL (ref 6–20)
CHLORIDE: 99 mmol/L — AB (ref 101–111)
CO2: 24 mmol/L (ref 22–32)
CREATININE: 1.01 mg/dL (ref 0.61–1.24)
Calcium: 7.8 mg/dL — ABNORMAL LOW (ref 8.9–10.3)
GFR calc Af Amer: >60 mL/min (ref >60)
GFR calc non Af Amer: >60 mL/min (ref >60)
Glucose, Bld: 406 mg/dL — ABNORMAL HIGH (ref 65–99)
Potassium: 3.6 mmol/L (ref 3.5–5.1)
Sodium: 131 mmol/L — ABNORMAL LOW (ref 135–145)

## 2016-08-20 LAB — GLUCOSE, CAPILLARY
GLUCOSE-CAPILLARY: 108 mg/dL — AB (ref 65–99)
GLUCOSE-CAPILLARY: 96 mg/dL (ref 65–99)
GLUCOSE-CAPILLARY: 146 mg/dL — AB (ref 65–99)
Glucose-Capillary: 436 mg/dL — ABNORMAL HIGH (ref 65–99)

## 2016-08-20 MED ORDER — DIPHENHYDRAMINE HCL 25 MG PO CAPS
25.0000 mg | ORAL_CAPSULE | Freq: Four times a day (QID) | ORAL | Status: DC | PRN
  Administered 2016-08-23: 25 mg via ORAL
  Filled 2016-08-20: qty 1

## 2016-08-20 MED ORDER — ONDANSETRON HCL 4 MG/2ML IJ SOLN: 4.0000 mg | Freq: Four times a day (QID) | INTRAMUSCULAR | Status: DC | PRN

## 2016-08-20 MED ORDER — INSULIN GLARGINE 100 UNIT/ML ~~LOC~~ SOLN
25.0000 [IU] | Freq: Every day | SUBCUTANEOUS | Status: DC
  Administered 2016-08-20 – 2016-08-21 (×2): 25 [IU] via SUBCUTANEOUS
  Filled 2016-08-20 (×3): qty 0.25

## 2016-08-20 MED ORDER — ACETAMINOPHEN 325 MG PO TABS
650.0000 mg | ORAL_TABLET | Freq: Four times a day (QID) | ORAL | Status: DC | PRN
  Administered 2016-08-23 – 2016-08-25 (×2): 650 mg via ORAL
  Filled 2016-08-20 (×2): qty 2

## 2016-08-20 MED ORDER — ZOLPIDEM TARTRATE 5 MG PO TABS: 5.0000 mg | ORAL_TABLET | Freq: Every evening | ORAL | Status: DC | PRN

## 2016-08-20 MED ORDER — ENSURE ENLIVE PO LIQD: 237.0000 mL | Freq: Two times a day (BID) | ORAL | Status: DC

## 2016-08-20 MED ORDER — GLUCERNA SHAKE PO LIQD
237.0000 mL | Freq: Three times a day (TID) | ORAL | Status: DC
  Administered 2016-08-20 – 2016-08-25 (×13): 237 mL via ORAL

## 2016-08-20 MED ORDER — VANCOMYCIN HCL IN DEXTROSE 750-5 MG/150ML-% IV SOLN
750.0000 mg | Freq: Two times a day (BID) | INTRAVENOUS | Status: DC
  Administered 2016-08-20 – 2016-08-22 (×5): 750 mg via INTRAVENOUS
  Filled 2016-08-20 (×5): qty 150

## 2016-08-20 MED ORDER — HYDROMORPHONE HCL 2 MG/ML IJ SOLN
1.0000 mg | INTRAMUSCULAR | Status: DC | PRN
Start: 2016-08-20 — End: 2016-08-25
  Administered 2016-08-20 – 2016-08-25 (×23): 1 mg via INTRAVENOUS
  Filled 2016-08-20 (×23): qty 1

## 2016-08-20 MED ORDER — PIPERACILLIN-TAZOBACTAM 3.375 G IVPB
3.3750 g | Freq: Three times a day (TID) | INTRAVENOUS | Status: DC
  Administered 2016-08-20 – 2016-08-22 (×8): 3.375 g via INTRAVENOUS
  Filled 2016-08-20 (×9): qty 50

## 2016-08-20 MED ORDER — INSULIN ASPART 100 UNIT/ML ~~LOC~~ SOLN
0.0000 [IU] | Freq: Every day | SUBCUTANEOUS | Status: DC
  Administered 2016-08-22: 3 [IU] via SUBCUTANEOUS

## 2016-08-20 MED ORDER — INSULIN ASPART 100 UNIT/ML ~~LOC~~ SOLN
0.0000 [IU] | Freq: Three times a day (TID) | SUBCUTANEOUS | Status: DC
Start: 2016-08-20 — End: 2016-08-25
  Administered 2016-08-20 – 2016-08-21 (×2): 9 [IU] via SUBCUTANEOUS
  Administered 2016-08-22: 1 [IU] via SUBCUTANEOUS
  Administered 2016-08-23 (×2): 3 [IU] via SUBCUTANEOUS
  Administered 2016-08-23: 2 [IU] via SUBCUTANEOUS
  Administered 2016-08-24: 3 [IU] via SUBCUTANEOUS
  Administered 2016-08-24: 7 [IU] via SUBCUTANEOUS
  Administered 2016-08-25: 2 [IU] via SUBCUTANEOUS

## 2016-08-20 MED ORDER — SODIUM CHLORIDE 0.45 % IV BOLUS
1000.0000 mL | Freq: Once | INTRAVENOUS | Status: AC
  Administered 2016-08-20: 1000 mL via INTRAVENOUS

## 2016-08-20 MED ORDER — ACETAMINOPHEN 650 MG RE SUPP: 650.0000 mg | Freq: Four times a day (QID) | RECTAL | Status: DC | PRN

## 2016-08-20 MED ORDER — INSULIN ASPART 100 UNIT/ML ~~LOC~~ SOLN
3.0000 [IU] | Freq: Three times a day (TID) | SUBCUTANEOUS | Status: DC
  Administered 2016-08-20 – 2016-08-22 (×5): 3 [IU] via SUBCUTANEOUS

## 2016-08-20 MED ORDER — ENOXAPARIN SODIUM 40 MG/0.4ML ~~LOC~~ SOLN
40.0000 mg | SUBCUTANEOUS | Status: DC
  Administered 2016-08-20 – 2016-08-25 (×6): 40 mg via SUBCUTANEOUS
  Filled 2016-08-20 (×6): qty 0.4

## 2016-08-20 MED ORDER — DIPHENHYDRAMINE HCL 50 MG/ML IJ SOLN: 25.0000 mg | Freq: Four times a day (QID) | INTRAMUSCULAR | Status: DC | PRN

## 2016-08-20 MED ORDER — ONDANSETRON 4 MG PO TBDP: 4.0000 mg | ORAL_TABLET | Freq: Four times a day (QID) | ORAL | Status: DC | PRN

## 2016-08-20 NOTE — Progress Notes (Signed)
Pharmacy Antibiotic Note  Larinda ButteryGary Evenson is a 56 y.o. male admitted on 08/19/2016 with necrotizing fascitis, right buttock.  Pharmacy has been consulted for Vancomycin/Zosyn dosing. WBC WNL. Renal function good. OR notes with large amount of foul smelling purulent fluid.   Plan: Vancomycin 750 mg IV q12h Zosyn 3.375G IV q8h to be infused over 4 hours Trend WBC, temp, renal function  F/U infectious work-up Drug levels as indicated  Height: 5\' 7"  (170.2 cm) Weight: 130 lb (59 kg) IBW/kg (Calculated) : 66.1  Temp (24hrs), Avg:98.3 F (36.8 C), Min:97.7 F (36.5 C), Max:99.6 F (37.6 C)   Recent Labs Lab 08/19/16 1546  WBC 6.9  CREATININE 1.07    Estimated Creatinine Clearance: 63.6 mL/min (by C-G formula based on SCr of 1.07 mg/dL).    Allergies  Allergen Reactions  . Ativan [Lorazepam] Other (See Comments)    HALLUCINATIONS   Abran DukeLedford, Darryon Bastin 08/20/2016 12:29 AM

## 2016-08-20 NOTE — Progress Notes (Signed)
Spoke with PA Hyacinth MeekerMiller, notified her of patients Bp, not symptomatic she said she would order a bolus of fluids. Will continue to monitor.

## 2016-08-20 NOTE — Progress Notes (Signed)
Notified PA Miller about patient's blood sugar. Will continue to monitor.

## 2016-08-20 NOTE — Anesthesia Postprocedure Evaluation (Signed)
Anesthesia Post Note  Patient: Raymond Winters  Procedure(s) Performed: Procedure(s) (LRB): DEBRIDEMENT RIGHT BUTTOCK  ABSCESS (Right)  Patient location during evaluation: PACU Anesthesia Type: General Level of consciousness: awake and alert Pain management: pain level controlled Vital Signs Assessment: post-procedure vital signs reviewed and stable Respiratory status: spontaneous breathing, nonlabored ventilation and respiratory function stable Cardiovascular status: blood pressure returned to baseline and stable Postop Assessment: no signs of nausea or vomiting Anesthetic complications: no       Last Vitals:  Vitals:   08/20/16 0033 08/20/16 0451  BP: (!) 141/80 111/78  Pulse: 93 90  Resp: 14 15  Temp: 37 C 36.4 C    Last Pain:  Vitals:   08/20/16 0843  TempSrc:   PainSc: 8                  [name],W. EDMOND

## 2016-08-20 NOTE — Progress Notes (Signed)
Patient ID: Raymond Winters, male   DOB: 1961-01-04, 56 y.o.   MRN: [id number]  [facility] Progress Note  1 Day Post-Op  Subjective: Patient sitting up in bed eating breakfast. States that his surgical site is sore but overall ok.  Blood glucose was 436 this morning.  Objective: Vital signs in last 24 hours: Temp:  [97.5 F (36.4 C)-99.6 F (37.6 C)] 97.5 F (36.4 C) (03/01 0451) Pulse Rate:  [71-103] 90 (03/01 0451) Resp:  [9-18] 15 (03/01 0451) BP: (102-143)/(78-103) 111/78 (03/01 0451) SpO2:  [97 %-100 %] 100 % (03/01 0451) Weight:  [130 lb (59 kg)] 130 lb (59 kg) (02/28 1316) Last BM Date: 08/19/16  Intake/Output from previous day: 02/28 0701 - 03/01 0700 In: 1812.5 [P.O.:300; I.V.:1462.5; IV Piggyback:50] Out: 400 [Urine:400] Intake/Output this shift: No intake/output data recorded.  PE: Gen:  Cachectic, alert, NAD, pleasant Pulm:  Effort normal Abd: Soft, NT/ND, +BS, no HSM Skin: right hip wound s/p I&D with beefy red tissue, no necrotic tissue, purulent drainage, or foul odor Ext:  No erythema, edema, or tenderness   Lab Results:   Recent Labs  08/19/16 1546 08/20/16 0432  WBC 6.9 6.0  HGB 11.6* 10.0*  HCT 34.6* 30.4*  PLT 179 158   BMET  Recent Labs  08/19/16 1546 08/20/16 0432  NA 133* 131*  K 3.8 3.6  CL 98* 99*  CO2 22 24  GLUCOSE 276* 406*  BUN 10 7  CREATININE 1.07 1.01  CALCIUM 8.5* 7.8*   PT/INR  Recent Labs  08/19/16 2013  LABPROT 15.5*  INR 1.22   CMP     Component Value Date/Time   NA 131 (L) 08/20/2016 0432   K 3.6 08/20/2016 0432   CL 99 (L) 08/20/2016 0432   CO2 24 08/20/2016 0432   GLUCOSE 406 (H) 08/20/2016 0432   BUN 7 08/20/2016 0432   CREATININE 1.01 08/20/2016 0432   CREATININE 1.16 06/29/2016 0925   CALCIUM 7.8 (L) 08/20/2016 0432   PROT 6.3 (L) 08/19/2016 2013   ALBUMIN 2.2 (L) 08/19/2016 2013   AST 23 08/19/2016 2013   ALT 11 (L) 08/19/2016 2013   ALKPHOS 64 08/19/2016 2013   BILITOT 0.6  08/19/2016 2013   GFRNONAA >60 08/20/2016 0432   GFRNONAA 70 06/29/2016 0925   GFRAA >60 08/20/2016 0432   GFRAA 80 06/29/2016 0925   Lipase     Component Value Date/Time   LIPASE 16 09/15/2015 1700       Studies/Results: Mr Hip Right W Wo Contrast  Result Date: 08/19/2016 CLINICAL DATA:  Right hip and buttock pain, redness and swelling for 1 week. Prior history of fall. EXAM: MRI OF THE RIGHT FEMUR WITHOUT AND WITH CONTRAST; MRI OF THE RIGHT HIP WITHOUT AND WITH CONTRAST TECHNIQUE: Multiplanar, multisequence MR imaging of the right hip and femur. Was performed both before and after administration of intravenous contrast. CONTRAST:  12mL MULTIHANCE GADOBENATE DIMEGLUMINE 529 MG/ML IV SOLN COMPARISON:  Radiographs 08/19/2016 FINDINGS: There is a large complex fluid collection in the right buttock area superficial to the gluteus maximus muscle. This measures approximate 15 x 10 cm and contains gas. They could be a partially liquified hematoma that has become infected. After contrast administration there is extensive surrounding enhancement. There is also significant edema like signal abnormality in the adjacent gluteus maximus muscle which may be focal myositis or a muscle tear with secondary pyomyositis. Bilateral adductor muscle edema is likely related to muscle tears. Bilateral hip AVN is noted. However,  I do not see any findings to suggest septic arthritis or osteomyelitis involving the hips or pelvis. No significant intrapelvic abnormalities are demonstrated. IMPRESSION: 1. Large complex gas containing fluid collection in the right buttock area most likely an infected hematoma/large abscess measuring approximately 15 x 10 cm. 2. Mass effect on the gluteus maximus muscle which demonstrates significant myositis and areas of pyomyositis. 3. 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 4. No findings for septic arthritis or osteomyelitis involving the pelvis,  hips or right femur. 5. Bilateral adductor muscle edema, likely due to muscle strains or partial tears. Electronically Signed   By: Rudie MeyerP.  Gallerani M.D.   On: 08/19/2016 19:31   Mr Femur Right W Wo Contrast  Result Date: 08/19/2016 CLINICAL DATA:  Right hip and buttock pain, redness and swelling for 1 week. Prior history of fall. EXAM: MRI OF THE RIGHT FEMUR WITHOUT AND WITH CONTRAST; MRI OF THE RIGHT HIP WITHOUT AND WITH CONTRAST TECHNIQUE: Multiplanar, multisequence MR imaging of the right hip and femur. Was performed both before and after administration of intravenous contrast. CONTRAST:  12mL MULTIHANCE GADOBENATE DIMEGLUMINE 529 MG/ML IV SOLN COMPARISON:  Radiographs 08/19/2016 FINDINGS: There is a large complex fluid collection in the right buttock area superficial to the gluteus maximus muscle. This measures approximate 15 x 10 cm and contains gas. They could be a partially liquified hematoma that has become infected. After contrast administration there is extensive surrounding enhancement. There is also significant edema like signal abnormality in the adjacent gluteus maximus muscle which may be focal myositis or a muscle tear with secondary pyomyositis. Bilateral adductor muscle edema is likely related to muscle tears. Bilateral hip AVN is noted. However, I do not see any findings to suggest septic arthritis or osteomyelitis involving the hips or pelvis. No significant intrapelvic abnormalities are demonstrated. IMPRESSION: 1. Large complex gas containing fluid collection in the right buttock area most likely an infected hematoma/large abscess measuring approximately 15 x 10 cm. 2. Mass effect on the gluteus maximus muscle which demonstrates significant myositis and areas of pyomyositis. 3. 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 4. No findings for septic arthritis or osteomyelitis involving the pelvis, hips or right femur. 5. Bilateral adductor muscle edema,  likely due to muscle strains or partial tears. Electronically Signed   By: Rudie MeyerP.  Gallerani M.D.   On: 08/19/2016 19:31   Dg Hip Unilat  With Pelvis 2-3 Views Right  Result Date: 08/19/2016 CLINICAL DATA:  Pain and swelling following fall 1 week prior EXAM: DG HIP (WITH OR WITHOUT PELVIS) 2-3V RIGHT COMPARISON:  CT abdomen and pelvis with bony reformats September 15, 2015 FINDINGS: Frontal pelvis as well as frontal and lateral right hip images were obtained. There is no acute fracture or dislocation. Calcification lateral to the right acetabulum was present previously and may have arthropathic etiology or the result of old trauma in this area. There is soft tissue fullness lateral to the right hip joint with foci of air in this area. There is subcutaneous edema in this area lateral to the right hip joint and inferior right iliac crest. There is subtle cortical loss along the right inferolateral iliac crest. The appearance in this area is concerning for soft tissue abscess causing localized bony destruction along the inferolateral right iliac crest. No other bony destruction evident. IMPRESSION: Findings felt to represent soft tissue abscess lateral to the right hip joint and inferior right lateral iliac crest  with apparent localized bony destruction along the inferolateral right iliac crest. This finding warrants CT of the pelvis, ideally with intravenous contrast, to further assess. It is difficult to exclude infection/inflammation extending into the right hip joint, although there is no widening of the right hip joint. No acute fracture or dislocation. These results were called by telephone at the time of interpretation on 08/19/2016 at 1:52 pm to Dr. Azalia Bilis , who verbally acknowledged these results. Electronically Signed   By: Bretta Bang III M.D.   On: 08/19/2016 13:52    Anti-infectives: Anti-infectives    Start     Dose/Rate Route Frequency Ordered Stop   08/20/16 0800  vancomycin (VANCOCIN) IVPB  750 mg/150 ml premix     750 mg 150 mL/hr over 60 Minutes Intravenous Every 12 hours 08/20/16 0033     08/20/16 0100  piperacillin-tazobactam (ZOSYN) IVPB 3.375 g     3.375 g 12.5 mL/hr over 240 Minutes Intravenous Every 8 hours 08/20/16 0033     08/19/16 1645  vancomycin (VANCOCIN) IVPB 1000 mg/200 mL premix     1,000 mg 200 mL/hr over 60 Minutes Intravenous  Once 08/19/16 1639 08/19/16 2128   08/19/16 1645  piperacillin-tazobactam (ZOSYN) IVPB 3.375 g     3.375 g 100 mL/hr over 30 Minutes Intravenous  Once 08/19/16 1639 08/19/16 2014       Assessment/Plan Necrotizing fasciitis right buttock Incision and debridement of right buttock necrotizing fasciitis - skin, subcutaneous tissue, fascia, muscle - total wound area 20 x 10 cm x 3 cm deep 2/28 Dr. Corliss Skains - POD 1 - BID wet to dry dressing changes. T/c vac placement in 1-2 days - culture pending, gram stain growing gram + rods and cocci in pairs  IDDM - continue SSI, spoke with diabetes coordinator and we will restart home lantus 25u this am and continue daily Hepatitis C Heroin abuse  ID - vanco 2/28>>, zosyn 2/28>> FEN - regular diet VTE - SCDs  Plan - Dressing changed this morning, continue BID wet to dry dressing changes. Ok to shower with wound open. Continue IV antibiotics. Follow cultures. Add Glucerna for nutrition supplementation.   LOS: 1 day    Edson Snowball , [facility] Surgery 08/20/2016, 8:56 AM Pager: [phone] Consults: [phone] Mon-Fri 7:00 am-4:30 pm Sat-Sun 7:00 am-11:30 am

## 2016-08-20 NOTE — Care Management Note (Addendum)
Case Management Note  Patient Details  Name: Raymond ButteryGary Winters MRN: [id number] Date of Birth: 07-22-1960  Subjective/Objective:                    Action/Plan: Confirmed face sheet information with patient. Patient lives with his brother .  [facility]  To [facility] Expected Discharge Date:                  Expected Discharge Plan:  Home w Home Health Services  In-House Referral:     Discharge planning Services  CM Consult  Post Acute Care Choice:  Home Health Choice offered to:  Patient  DME Arranged:  Vac DME Agency:  [facility]  HH Arranged:  RN HH Agency:  [facility]  Status of Service:  In process, will continue to follow  If discussed at Long Length of Stay Meetings, dates discussed:    Additional Comments:  Kingsley PlanWile, Gladstone Rosas Marie, RN 08/20/2016, 1:30 PM

## 2016-08-21 ENCOUNTER — Other Ambulatory Visit: Payer: Self-pay | Admitting: Family Medicine

## 2016-08-21 LAB — GLUCOSE, CAPILLARY
GLUCOSE-CAPILLARY: 401 mg/dL — AB (ref 65–99)
GLUCOSE-CAPILLARY: 59 mg/dL — AB (ref 65–99)
GLUCOSE-CAPILLARY: 161 mg/dL — AB (ref 65–99)
Glucose-Capillary: 57 mg/dL — ABNORMAL LOW (ref 65–99)
Glucose-Capillary: 60 mg/dL — ABNORMAL LOW (ref 65–99)
Glucose-Capillary: 97 mg/dL (ref 65–99)
Glucose-Capillary: 120 mg/dL — ABNORMAL HIGH (ref 65–99)

## 2016-08-21 MED ORDER — GLUCOSE 40 % PO GEL
1.0000 | Freq: Once | ORAL | Status: AC
  Administered 2016-08-21: 37.5 g via ORAL

## 2016-08-21 MED ORDER — ADULT MULTIVITAMIN W/MINERALS CH
1.0000 | ORAL_TABLET | Freq: Every day | ORAL | Status: DC
  Administered 2016-08-21 – 2016-08-24 (×4): 1 via ORAL
  Filled 2016-08-21 (×4): qty 1

## 2016-08-21 MED ORDER — GLUCOSE 40 % PO GEL
ORAL | Status: AC
  Filled 2016-08-21: qty 1

## 2016-08-21 NOTE — Consult Note (Addendum)
WOC Nurse wound consult note Pt is followed by surgical team for assessment and plan of care. Reason for Consult: Consult requested for Vac application to right hip/upper thigh Wound type: Full thickness post-op wound  Measurement: 19X9X2cm Wound bed: beefy red, interspersed throughout with some yellow Drainage (amount, consistency, odor) small amt yellow drainage, no odor Periwound: intact skin surrounding Dressing procedure/placement/frequency: Pt medicated for pain prior to procedure, but process was still very painful.  Applied Mepitel contact layer to assist with decreased pain with next dressing change and avoid adherence of dressings. One piece of black foam applied with 125mm cont suction.  Plan to change dressing on Mon if patient is still in the hospital at that time.   Cammie Mcgeeawn Eliyahu Bille MSN, RN, CWOCN, [place], CNS [phone]

## 2016-08-21 NOTE — Progress Notes (Addendum)
Patient ID: Raymond Winters, male   DOB: 12/29/1960, 56 y.o.   MRN: [id number]  [facility] Surgery Progress Note  2 Days Post-Op  Subjective: Patient doing well this morning. BG 401 this AM because he ate 3 cups of ice cream last night. Continues to have significant pain from his wound requiring IV pain medication, pain is more severe during dressing changes.  Concerned when he goes home that he may not have all the help he needs. Lives with his brother, but does not feel that he will be able to help him with wound care.  Objective: Vital signs in last 24 hours: Temp:  [98.5 F (36.9 C)-99.3 F (37.4 C)] 98.5 F (36.9 C) (03/02 0511) Pulse Rate:  [83-98] 83 (03/02 0511) Resp:  [17] 17 (03/02 0511) BP: (84-116)/(50-80) 114/80 (03/02 0511) SpO2:  [99 %] 99 % (03/02 0511) Last BM Date: 08/20/16  Intake/Output from previous day: 03/01 0701 - 03/02 0700 In: 2548.3 [P.O.:357; I.V.:1741.3; IV Piggyback:450] Out: 1225 [Urine:1225] Intake/Output this shift: Total I/O In: 60 [P.O.:60] Out: -   PE: Gen:  Cachectic, alert, NAD, pleasant Pulm:  Effort normal Abd: Soft, NT/ND, +BS, no HSM Skin: right hip wound s/p I&D with beefy red tissue, no necrotic tissue, purulent drainage, or foul odor:      Lab Results:   Recent Labs  08/19/16 1546 08/20/16 0432  WBC 6.9 6.0  HGB 11.6* 10.0*  HCT 34.6* 30.4*  PLT 179 158   BMET  Recent Labs  08/19/16 1546 08/20/16 0432  NA 133* 131*  K 3.8 3.6  CL 98* 99*  CO2 22 24  GLUCOSE 276* 406*  BUN 10 7  CREATININE 1.07 1.01  CALCIUM 8.5* 7.8*   PT/INR  Recent Labs  08/19/16 2013  LABPROT 15.5*  INR 1.22   CMP     Component Value Date/Time   NA 131 (L) 08/20/2016 0432   K 3.6 08/20/2016 0432   CL 99 (L) 08/20/2016 0432   CO2 24 08/20/2016 0432   GLUCOSE 406 (H) 08/20/2016 0432   BUN 7 08/20/2016 0432   CREATININE 1.01 08/20/2016 0432   CREATININE 1.16 06/29/2016 0925   CALCIUM 7.8 (L) 08/20/2016 0432   PROT 6.3 (L)  08/19/2016 2013   ALBUMIN 2.2 (L) 08/19/2016 2013   AST 23 08/19/2016 2013   ALT 11 (L) 08/19/2016 2013   ALKPHOS 64 08/19/2016 2013   BILITOT 0.6 08/19/2016 2013   GFRNONAA >60 08/20/2016 0432   GFRNONAA 70 06/29/2016 0925   GFRAA >60 08/20/2016 0432   GFRAA 80 06/29/2016 0925   Lipase     Component Value Date/Time   LIPASE 16 09/15/2015 1700       Studies/Results: Mr Hip Right W Wo Contrast  Result Date: 08/19/2016 CLINICAL DATA:  Right hip and buttock pain, redness and swelling for 1 week. Prior history of fall. EXAM: MRI OF THE RIGHT FEMUR WITHOUT AND WITH CONTRAST; MRI OF THE RIGHT HIP WITHOUT AND WITH CONTRAST TECHNIQUE: Multiplanar, multisequence MR imaging of the right hip and femur. Was performed both before and after administration of intravenous contrast. CONTRAST:  12mL MULTIHANCE GADOBENATE DIMEGLUMINE 529 MG/ML IV SOLN COMPARISON:  Radiographs 08/19/2016 FINDINGS: There is a large complex fluid collection in the right buttock area superficial to the gluteus maximus muscle. This measures approximate 15 x 10 cm and contains gas. They could be a partially liquified hematoma that has become infected. After contrast administration there is extensive surrounding enhancement. There is also significant edema like  signal abnormality in the adjacent gluteus maximus muscle which may be focal myositis or a muscle tear with secondary pyomyositis. Bilateral adductor muscle edema is likely related to muscle tears. Bilateral hip AVN is noted. However, I do not see any findings to suggest septic arthritis or osteomyelitis involving the hips or pelvis. No significant intrapelvic abnormalities are demonstrated. IMPRESSION: 1. Large complex gas containing fluid collection in the right buttock area most likely an infected hematoma/large abscess measuring approximately 15 x 10 cm. 2. Mass effect on the gluteus maximus muscle which demonstrates significant myositis and areas of pyomyositis. 3.  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 4. No findings for septic arthritis or osteomyelitis involving the pelvis, hips or right femur. 5. Bilateral adductor muscle edema, likely due to muscle strains or partial tears. Electronically Signed   By: Rudie Meyer M.D.   On: 08/19/2016 19:31   Mr Femur Right W Wo Contrast  Result Date: 08/19/2016 CLINICAL DATA:  Right hip and buttock pain, redness and swelling for 1 week. Prior history of fall. EXAM: MRI OF THE RIGHT FEMUR WITHOUT AND WITH CONTRAST; MRI OF THE RIGHT HIP WITHOUT AND WITH CONTRAST TECHNIQUE: Multiplanar, multisequence MR imaging of the right hip and femur. Was performed both before and after administration of intravenous contrast. CONTRAST:  12mL MULTIHANCE GADOBENATE DIMEGLUMINE 529 MG/ML IV SOLN COMPARISON:  Radiographs 08/19/2016 FINDINGS: There is a large complex fluid collection in the right buttock area superficial to the gluteus maximus muscle. This measures approximate 15 x 10 cm and contains gas. They could be a partially liquified hematoma that has become infected. After contrast administration there is extensive surrounding enhancement. There is also significant edema like signal abnormality in the adjacent gluteus maximus muscle which may be focal myositis or a muscle tear with secondary pyomyositis. Bilateral adductor muscle edema is likely related to muscle tears. Bilateral hip AVN is noted. However, I do not see any findings to suggest septic arthritis or osteomyelitis involving the hips or pelvis. No significant intrapelvic abnormalities are demonstrated. IMPRESSION: 1. Large complex gas containing fluid collection in the right buttock area most likely an infected hematoma/large abscess measuring approximately 15 x 10 cm. 2. Mass effect on the gluteus maximus muscle which demonstrates significant myositis and areas of pyomyositis. 3. Bilateral chronic hip AVN and remote bone infarct involving  the left femoral shaft. 4. No findings for septic arthritis or osteomyelitis involving the pelvis, hips or right femur. 5. Bilateral adductor muscle edema, likely due to muscle strains or partial tears. Electronically Signed   By: Rudie Meyer M.D.   On: 08/19/2016 19:31   Dg Hip Unilat  With Pelvis 2-3 Views Right  Result Date: 08/19/2016 CLINICAL DATA:  Pain and swelling following fall 1 week prior EXAM: DG HIP (WITH OR WITHOUT PELVIS) 2-3V RIGHT COMPARISON:  CT abdomen and pelvis with bony reformats September 15, 2015 FINDINGS: Frontal pelvis as well as frontal and lateral right hip images were obtained. There is no acute fracture or dislocation. Calcification lateral to the right acetabulum was present previously and may have arthropathic etiology or the result of old trauma in this area. There is soft tissue fullness lateral to the right hip joint with foci of air in this area. There is subcutaneous edema in this area lateral to the right hip joint and inferior right iliac crest. There is subtle cortical loss along the right inferolateral iliac crest. The appearance in this area is concerning for soft tissue  abscess causing localized bony destruction along the inferolateral right iliac crest. No other bony destruction evident. IMPRESSION: Findings felt to represent soft tissue abscess lateral to the right hip joint and inferior right lateral iliac crest with apparent localized bony destruction along the inferolateral right iliac crest. This finding warrants CT of the pelvis, ideally with intravenous contrast, to further assess. It is difficult to exclude infection/inflammation extending into the right hip joint, although there is no widening of the right hip joint. No acute fracture or dislocation. These results were called by telephone at the time of interpretation on 08/19/2016 at 1:52 pm to Dr. Azalia BilisKEVIN CAMPOS , who verbally acknowledged these results. Electronically Signed   By: Bretta BangWilliam  Woodruff III M.D.    On: 08/19/2016 13:52    Anti-infectives: Anti-infectives    Start     Dose/Rate Route Frequency Ordered Stop   08/20/16 0800  vancomycin (VANCOCIN) IVPB 750 mg/150 ml premix     750 mg 150 mL/hr over 60 Minutes Intravenous Every 12 hours 08/20/16 0033     08/20/16 0100  piperacillin-tazobactam (ZOSYN) IVPB 3.375 g     3.375 g 12.5 mL/hr over 240 Minutes Intravenous Every 8 hours 08/20/16 0033     08/19/16 1645  vancomycin (VANCOCIN) IVPB 1000 mg/200 mL premix     1,000 mg 200 mL/hr over 60 Minutes Intravenous  Once 08/19/16 1639 08/19/16 2128   08/19/16 1645  piperacillin-tazobactam (ZOSYN) IVPB 3.375 g     3.375 g 100 mL/hr over 30 Minutes Intravenous  Once 08/19/16 1639 08/19/16 2014       Assessment/Plan Necrotizing fasciitis right buttock Incision and debridement of right buttock necrotizing fasciitis - skin, subcutaneous tissue, fascia, muscle - total wound area 20 x 10 cm x 3 cm deep 2/28 Dr. Corliss Skainssuei - POD 2 - culture pending, gram stain growing gram + rods and cocci in pairs - afebrile  IDDM - continue SSI, home lantus 25u qd Hepatitis C Heroin abuse  ID - vanco 2/28>>, zosyn 2/28>> FEN - carb modified VTE - SCDs  Plan - Transition to wound vac today. Continue IV antibiotics and follow cultures. Home health consulted.     LOS: 2 days    Edson SnowballBROOKE A MILLER , [facility] 08/21/2016, 8:05 AM Pager: [phone] Consults: [phone] Mon-Fri 7:00 am-4:30 pm Sat-Sun 7:00 am-11:30 am

## 2016-08-21 NOTE — Progress Notes (Signed)
PA made aware of low BS despite intervention. Patient is alert and oriented, not in any distress. Will continue to give snack and will monitor that patient willl have lunch. Will recheck again after lunch.

## 2016-08-21 NOTE — Progress Notes (Signed)
Initial Nutrition Assessment  DOCUMENTATION CODES:   Severe malnutrition in context of chronic illness  INTERVENTION:   -D/c Ensure Enlive po BID, each supplement provides 350 kcal and 20 grams of protein -Continue Glucerna Shake po TID, each supplement provides 220 kcal and 10 grams of protein -MVI daily  NUTRITION DIAGNOSIS:   Malnutrition related to chronic illness as evidenced by moderate depletion of body fat, severe depletion of body fat, moderate depletions of muscle mass, severe depletion of muscle mass.  GOAL:   Patient will meet greater than or equal to 90% of their needs  MONITOR:   PO intake, Supplement acceptance, Labs, Weight trends, Skin, I & O's  REASON FOR ASSESSMENT:   Malnutrition Screening Tool    ASSESSMENT:   56 yo male with substance abuse presents after a fall last week.  He landed on his right hip and was laying there for about 30 minutes before his brother arrived to help him up.  He states that the area became bruised.  Subsequently, over the last couple of days, the area has become much more swollen, erythematous, and tender.  Pt admitted with large soft tissue infection on rt hip and buttock. S/p I&D on 08/19/16.   Spoke with pt at bedside, who reports poor appetite over the past week related to pain and taste changes associated with pain from wound related to falling. Pt shares that his appetite is usually good and consumes 2 meals per day, however, "nothing tasted right". His appetite has returned and consumed 100% of breakfast meal today.   Pt shares UBW is around 140#. He estimates he has lost about 10# over the past week, however, this is not consistent with wt hx. Wt hx in general is variable, however, noted wt gain trend over the past year, which is favorable given hx of weight loss and malnutrition.   Reviewed CWOCN note from 08/21/16; pt with full thickness post-op wound to rt hip and upper thigh, to which wound vac was applied. Pt reports  tolerating procedure well ("it's like it's not even there").   Discussed with pt importance of good meal and supplement intake to promote healing. Emphasized importance of increasing protein in diet to preserve lean body mass and promote wound healing. Pt reports he enjoys Glucerna supplements and would like to continue with them. RD provided pt with a butter pecan flavored Glucerna supplement per his request.   Nutrition-Focused physical exam completed. Findings are moderate to severe fat depletion, moderate to severe muscle depletion, and no edema.   Labs reviewed: CBGS: [id number].   Diet Order:  Diet Carb Modified Fluid consistency: Thin; Room service appropriate? Yes  Skin:  Wound (see comment) (wound vac to rt hip/upper thigh post-op full thickness wound)  Last BM:  08/22/15  Height:   Ht Readings from Last 1 Encounters:  08/19/16 5\' 7"  (1.702 m)    Weight:   Wt Readings from Last 1 Encounters:  08/19/16 130 lb (59 kg)    Ideal Body Weight:  67.3 kg  BMI:  Body mass index is 20.36 kg/m.  Estimated Nutritional Needs:   Kcal:  1850-2050  Protein:  95-110 grams  Fluid:  >1.8 L  EDUCATION NEEDS:   Education needs addressed  Raymond Winters, RD, LDN, CDE Pager: [phone] After hours Pager: [phone]

## 2016-08-21 NOTE — Progress Notes (Signed)
MD/PA made aware of high BS. No new order.

## 2016-08-22 ENCOUNTER — Encounter (HOSPITAL_COMMUNITY): Payer: Self-pay | Admitting: Internal Medicine

## 2016-08-22 DIAGNOSIS — E162 Hypoglycemia, unspecified: Secondary | ICD-10-CM

## 2016-08-22 DIAGNOSIS — E1142 Type 2 diabetes mellitus with diabetic polyneuropathy: Secondary | ICD-10-CM

## 2016-08-22 LAB — BLOOD CULTURE ID PANEL (REFLEXED)
ACINETOBACTER BAUMANNII: NOT DETECTED
CANDIDA ALBICANS: NOT DETECTED
CANDIDA GLABRATA: NOT DETECTED
Candida krusei: NOT DETECTED
Candida parapsilosis: NOT DETECTED
Candida tropicalis: NOT DETECTED
ENTEROBACTER CLOACAE COMPLEX: NOT DETECTED
ENTEROBACTERIACEAE SPECIES: NOT DETECTED
ENTEROCOCCUS SPECIES: NOT DETECTED
Escherichia coli: NOT DETECTED
HAEMOPHILUS INFLUENZAE: NOT DETECTED
KLEBSIELLA PNEUMONIAE: NOT DETECTED
Klebsiella oxytoca: NOT DETECTED
LISTERIA MONOCYTOGENES: NOT DETECTED
NEISSERIA MENINGITIDIS: NOT DETECTED
Proteus species: NOT DETECTED
Pseudomonas aeruginosa: NOT DETECTED
STREPTOCOCCUS AGALACTIAE: NOT DETECTED
STREPTOCOCCUS PNEUMONIAE: NOT DETECTED
STREPTOCOCCUS PYOGENES: NOT DETECTED
STREPTOCOCCUS SPECIES: NOT DETECTED
Serratia marcescens: NOT DETECTED
Staphylococcus aureus (BCID): NOT DETECTED
Staphylococcus species: NOT DETECTED

## 2016-08-22 LAB — COMPREHENSIVE METABOLIC PANEL
ALBUMIN: 1.7 g/dL — AB (ref 3.5–5.0)
ALK PHOS: 45 U/L (ref 38–126)
ALT: 13 U/L — AB (ref 17–63)
AST: 32 U/L (ref 15–41)
Anion gap: 7 (ref 5–15)
BILIRUBIN TOTAL: 0.5 mg/dL (ref 0.3–1.2)
BUN: 6 mg/dL (ref 6–20)
CALCIUM: 8.1 mg/dL — AB (ref 8.9–10.3)
CO2: 26 mmol/L (ref 22–32)
Chloride: 105 mmol/L (ref 101–111)
Creatinine, Ser: 1.23 mg/dL (ref 0.61–1.24)
GFR calc Af Amer: >60 mL/min (ref >60)
GFR calc non Af Amer: >60 mL/min (ref >60)
GLUCOSE: 285 mg/dL — AB (ref 65–99)
Potassium: 3.8 mmol/L (ref 3.5–5.1)
Sodium: 138 mmol/L (ref 135–145)
TOTAL PROTEIN: 5.1 g/dL — AB (ref 6.5–8.1)

## 2016-08-22 LAB — CREATININE, SERUM
CREATININE: 1.17 mg/dL (ref 0.61–1.24)
GFR calc Af Amer: >60 mL/min (ref >60)

## 2016-08-22 LAB — CBC
HCT: 29.8 % — ABNORMAL LOW (ref 39.0–52.0)
Hemoglobin: 9.6 g/dL — ABNORMAL LOW (ref 13.0–17.0)
MCH: 25.2 pg — AB (ref 26.0–34.0)
MCHC: 32.2 g/dL (ref 30.0–36.0)
MCV: 78.2 fL (ref 78.0–100.0)
Platelets: 209 10*3/uL (ref 150–400)
RBC: 3.81 MIL/uL — ABNORMAL LOW (ref 4.22–5.81)
RDW: 13.2 % (ref 11.5–15.5)
WBC: 3.5 10*3/uL — ABNORMAL LOW (ref 4.0–10.5)

## 2016-08-22 LAB — GLUCOSE, CAPILLARY
GLUCOSE-CAPILLARY: 137 mg/dL — AB (ref 65–99)
Glucose-Capillary: 509 mg/dL (ref 65–99)
Glucose-Capillary: 76 mg/dL (ref 65–99)
Glucose-Capillary: 258 mg/dL — ABNORMAL HIGH (ref 65–99)

## 2016-08-22 MED ORDER — INSULIN ASPART 100 UNIT/ML ~~LOC~~ SOLN
4.0000 [IU] | Freq: Three times a day (TID) | SUBCUTANEOUS | Status: DC
  Administered 2016-08-22 – 2016-08-24 (×6): 4 [IU] via SUBCUTANEOUS

## 2016-08-22 MED ORDER — INSULIN ASPART 100 UNIT/ML ~~LOC~~ SOLN
5.0000 [IU] | Freq: Once | SUBCUTANEOUS | Status: AC
  Administered 2016-08-22: 5 [IU] via SUBCUTANEOUS

## 2016-08-22 MED ORDER — INSULIN GLARGINE 100 UNIT/ML ~~LOC~~ SOLN
25.0000 [IU] | Freq: Once | SUBCUTANEOUS | Status: DC
  Filled 2016-08-22: qty 0.25

## 2016-08-22 MED ORDER — INSULIN GLARGINE 100 UNIT/ML ~~LOC~~ SOLN
25.0000 [IU] | Freq: Every day | SUBCUTANEOUS | Status: DC
  Administered 2016-08-23: 25 [IU] via SUBCUTANEOUS
  Filled 2016-08-22: qty 0.25

## 2016-08-22 MED ORDER — AMOXICILLIN-POT CLAVULANATE 875-125 MG PO TABS
1.0000 | ORAL_TABLET | Freq: Two times a day (BID) | ORAL | Status: DC
  Administered 2016-08-22 – 2016-08-25 (×7): 1 via ORAL
  Filled 2016-08-22 (×7): qty 1

## 2016-08-22 MED ORDER — INSULIN GLARGINE 100 UNIT/ML ~~LOC~~ SOLN: 25.0000 [IU] | Freq: Every day | SUBCUTANEOUS | Status: DC

## 2016-08-22 NOTE — Consult Note (Signed)
[facility] Medical Consultation  Gale Hulse ZOX:[id number] DOB: [birth year] DOA: 08/19/2016 PCP: Jaclyn Shaggy, MD   Requesting physician: Donell Beers Date of consultation: 08/22/16 Reason for consultation: poorly controlled diabetes  Impression/Recommendations Principal Problem:   DM2 (diabetes mellitus, type 2) (HCC) Active Problems:   Heroin abuse   Alcohol abuse   Protein-calorie malnutrition, severe (HCC)   Abscess   Chronic alcoholic pancreatitis (HCC)   Necrotizing fasciitis (HCC)   #1. Diabetes type 2. Uncontrolled during hospitalization.  Likely multifactorial i.e. Infectious process, unreliable appetite and scheduling of insulin.  Home medications include Lantus 25 units and sliding scale at mealtime. capillary  blood sugars quite labile. His Lantus is scheduled for 10 AM and then receives 3 units meal coverage and sliding scale as needed. Pattern last 2 days has been high CBGs in the morning that trend down during the day. Appetite improved and more consistent since yesterday, afebrile and no leukocytosis as of  Yesterday. Patient reports "this happens at home too". He confirms he takes his Lantus in the morning as well. He reports he then checks his sugars at mealtime and uses a sliding scale as needed. He states he typically doesn't need any coverage at the last meal the day. -obtain cbc and bmet -obtain A1c -change lantus schedule to qhs -increase meal coverage to 4units -monitor intake and output -keep SSI at sensative -request diabetes coordinator consult  2. Necrotizing fasciitis/abscess. Status post I%D on February 28 per general surgery -antibiotics changed to oral today -obtain cbc  #3. protien-calorie malnutrition. Likely related to history of EtOH abuse and polysubstance abuse. BMI 20.6.  Evaluated by dietician who opines severe malnutrition in setting of chronic illness. Recommends Enlive  BID and continuing glucerna and MVI -follow recommendations  #4. EtOH  abuse. Patient denies any further drinking. no Signs symptoms of withdrawal.  5. Heroin use. Chart review indicates patient admitted to "shooting up" with heroin a week prior to his fall. Of note he underwent extensive surgical debridement to his left shoulder last year secondary to a soft tissue infection related to IV drug abuse. -social work    [facility] will  followup again tomorrow. Please contact if can be of assistance in the meanwhile. Thank you for this consultation.  Chief Complaint: uncontrolled diabetes  HPI: Patient is a 56 year old male with history of substance abuse, chronic pancreatitis, hepatitis C, IV disease admitted to general surgery service on February 28 the chief complaint right buttock abscess.   According to chart prior to patient's admission he had fallen and landed on his right hip and was on the ground in that position for greater than 30 minutes. States his brother assisted him up and shortly thereafter area on his right hip became bruised and swollen and painful. Came to the hospital with complaints of worsening pain erythema and swelling. He was admitted by general surgery for large soft tissue infection likely secondary to infected hematoma. He underwent emergent debridement of the right buttock soft tissue infection. He was provided with vancomycin and Zosyn.  He underwent twice a day wet-to-dry dressings for 3 days. Wound VAC recommended and antibiotics changed to oral on March 3. He experienced fluctuations in his capillary blood sugar [facility] were asked to consult for improved diabetes control   Review of Systems:  Patient denies any headache dizziness syncope or near-syncope. Denies chest pain palpitation shortness of breath lower extremity edema. He denies nausea vomiting diarrhea constipation. He denies dysuria hematuria frequency or urgency. Reports pain is well  controlled. He also reports improved appetite over the last 2 days.  Past  Medical History:  Diagnosis Date  . Alcohol dependence (HCC) 01/05/2016  . Alcoholic liver disease, unspecified 01/05/2016  . Arthritis    "lower extremeties, hands, shoulders, back" (08/20/2016)  . Chronic alcoholic pancreatitis (HCC) 01/05/2016  . Depression   . Family history of adverse reaction to anesthesia    pt's sister has hx. of post-op N/V  . Hepatitis C    "never treated" (08/20/2016)  . Heroin abuse   . High cholesterol   . Hypoalbuminemia due to protein-calorie malnutrition (HCC) 01/05/2016  . Insulin dependent diabetes mellitus (HCC)   . Lactic acid increased, CHRONIC 01/05/2016   Secondary to Liver Disease, Alcoholism, and Diabetes  . 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 Shoulder wound 01/2016   left   Past Surgical History:  Procedure Laterality Date  . APPLICATION OF WOUND VAC Left 12/24/2015   Procedure: APPLICATION OF WOUND VAC; LEFT SHOULDER;  Surgeon: Tarry KosNaiping M Xu, MD;  Location: [facility] OR;  Service: Orthopedics;  Laterality: Left;  . APPLICATION OF WOUND VAC Left 01/29/2016   Procedure: LEFT SHOULDER SPLIT THICKNESS SKIN GRAFT, WOUND VAC;  Surgeon: Tarry KosNaiping M Xu, MD;  Location: [facility];  Service: Orthopedics;  Laterality: Left;  . I&D EXTREMITY Right 03/20/2013   Procedure: IRRIGATION AND DEBRIDEMENT EXTREMITY;  Surgeon: Marlowe ShoresMatthew A Weingold, MD;  Location: [facility] OR;  Service: Orthopedics;  Laterality: Right;  . I&D EXTREMITY Left 12/24/2015   Procedure:  DEBRIDEMENT OF MUSCLE, SUBCUTANEOUS TISSUE LEFT SHOULDER;  Surgeon: Tarry KosNaiping M Xu, MD;  Location: [facility] OR;  Service: Orthopedics;  Laterality: Left;  . INCISION AND DRAINAGE ABSCESS Right 08/19/2016   Procedure: DEBRIDEMENT RIGHT BUTTOCK  ABSCESS;  Surgeon: Manus RuddMatthew Tsuei, MD;  [facility];  Service: General;  Laterality: Right;  . IRRIGATION AND DEBRIDEMENT ABSCESS Right 08/19/2016   buttocks  . IRRIGATION AND DEBRIDEMENT SHOULDER Left 12/27/2015   Procedure: IRRIGATION AND DEBRIDEMENT  LEFT SHOULDER;  Surgeon: Tarry KosNaiping M Xu, MD;  Location: [facility];  Service: Orthopedics;  Laterality: Left;  . IRRIGATION AND DEBRIDEMENT SHOULDER Left 12/30/2015   Procedure: IRRIGATION AND DEBRIDEMENT LEFT SHOULDER;  Surgeon: Tarry KosNaiping M Xu, MD;  Location: [facility];  Service: Orthopedics;  Laterality: Left;  . SKIN SPLIT GRAFT Left 01/29/2016   Procedure: SKIN GRAFT SPLIT THICKNESS,;  Surgeon: Tarry KosNaiping M Xu, MD;  Location: [facility];  Service: Orthopedics;  Laterality: Left;   Social History:  reports that he has been smoking Cigarettes.  He has a 18.50 pack-year smoking history. He has never used smokeless tobacco. He reports that he drinks alcohol. He reports that he uses drugs, including IV and Heroin.  Allergies  Allergen Reactions  . Ativan [Lorazepam] Other (See Comments)    HALLUCINATIONS   Family History  Problem Relation Age of Onset  . Diabetes Mother   . Heart disease Mother   . Hypertension Mother   . Stroke Mother   . Vision loss Mother   . Heart disease Father   . Hypertension Father   . Anesthesia problems Sister     post-op N/V    Prior to Admission medications   Medication Sig Start Date End Date Taking? Authorizing Provider  aspirin EC 81 MG tablet Take 1 tablet (81 mg total) by mouth daily. 04/04/14  Yes Ambrose FinlandValerie A Keck, NP  atorvastatin (LIPITOR) 40 MG tablet Take 1 tablet (40 mg total) by mouth daily. 06/25/16  Yes Jaclyn Shaggy, MD  Blood Glucose Monitoring Suppl (TRUE METRIX METER) DEVI 1 each by Does not apply route 3 (three) times daily before meals. 06/25/16  Yes Jaclyn Shaggy, MD  cephALEXin (KEFLEX) 500 MG capsule Take 1 capsule (500 mg total) by mouth 4 (four) times daily. 08/16/16  Yes Rolland Porter, MD  gabapentin (NEURONTIN) 300 MG capsule TAKE 1 CAPSULE BY MOUTH AT BEDTIME FOR DIABETIC NERVE PAIN 06/25/16  Yes Jaclyn Shaggy, MD  glucose blood (TRUE METRIX BLOOD GLUCOSE TEST) test strip Use 3 times daily before meals 06/25/16  Yes Jaclyn Shaggy, MD  ibuprofen  (ADVIL,MOTRIN) 800 MG tablet Take 1 tablet (800 mg total) by mouth every 12 (twelve) hours as needed. Patient taking differently: Take 800 mg by mouth every 12 (twelve) hours as needed for moderate pain.  02/03/16  Yes Jaclyn Shaggy, MD  insulin aspart (NOVOLOG FLEXPEN) 100 UNIT/ML FlexPen 0-12 units before meals sliding scale as directed 06/29/16  Yes Jaclyn Shaggy, MD  Insulin Glargine (LANTUS SOLOSTAR) 100 UNIT/ML Solostar Pen Inject 25 Units into the skin daily at 10 pm. 07/15/16  Yes Jaclyn Shaggy, MD  meloxicam (MOBIC) 15 MG tablet Take 15 mg by mouth daily as needed for pain.    Yes Historical Provider, MD  mirtazapine (REMERON) 15 MG tablet Take 1 tablet (15 mg total) by mouth at bedtime. 02/03/16  Yes Jaclyn Shaggy, MD  Multiple Vitamins-Minerals (MULTIVITAMIN WITH MINERALS) tablet Take 1 tablet by mouth daily.   Yes Historical Provider, MD  Pancrelipase, Lip-Prot-Amyl, (ZENPEP) 40000-136000 units CPEP 40,000 by mouth 3 times daily Patient taking differently: Take 40,000 Units by mouth 3 (three) times daily. 40,000 by mouth 3 times daily 07/29/16  Yes Jaclyn Shaggy, MD  pravastatin (PRAVACHOL) 20 MG tablet Take 20 mg by mouth daily. 06/24/16  Yes Historical Provider, MD  TRUEPLUS LANCETS 28G MISC USE TO CHECK SUGARS 3 TIMES PER DAY 06/25/16  Yes Jaclyn Shaggy, MD   Physical Exam: Blood pressure 119/78, pulse 78, temperature 98.8 F (37.1 C), temperature source Oral, resp. rate 18, height 5\' 7"  (1.702 m), weight 59 kg (130 lb), SpO2 100 %. Vitals:   08/21/16 2116 08/22/16 0431  BP: 107/73 119/78  Pulse: 97 78  Resp: 17 18  Temp: 98.6 F (37 C) 98.8 F (37.1 C)     General:  Lying in bed somewhat thin no acute distress  Eyes: Pupils equal round reactive to light EOMI no scleral icterus  ENT: Ears clear nose without drainage oropharynx without erythema or exudate  Neck: Supple no JVD full range of motion  Cardiovascular: Regular rate and rhythm no murmur gallop or rub  Respiratory: Normal  effort breath sounds clear I hear no wheeze no crackles  Abdomen: Nondistended nontender positive bowel sounds throughout  Skin: Right hip with dressing dry and intact otherwise no rash lesions  Musculoskeletal: Joints without swelling/erythema  Psychiatric: Calm cooperative  Neurologic: Alert and oriented 3 speech clear facial symmetry  Labs on Admission:  Basic Metabolic Panel:  Recent Labs Lab 08/19/16 1546 08/20/16 0432 08/22/16 0528  NA 133* 131*  --   K 3.8 3.6  --   CL 98* 99*  --   CO2 22 24  --   GLUCOSE 276* 406*  --   BUN 10 7  --   CREATININE 1.07 1.01 1.17  CALCIUM 8.5* 7.8*  --    Liver Function Tests:  Recent Labs Lab 08/19/16 2013  AST 23  ALT 11*  ALKPHOS 64  BILITOT 0.6  PROT 6.3*  ALBUMIN 2.2*   No results for input(s): LIPASE, AMYLASE in the last 168 hours. No results for input(s): AMMONIA in the last 168 hours. CBC:  Recent Labs Lab 08/19/16 1546 08/20/16 0432  WBC 6.9 6.0  HGB 11.6* 10.0*  HCT 34.6* 30.4*  MCV 76.5* 75.8*  PLT 179 158   Cardiac Enzymes: No results for input(s): CKTOTAL, CKMB, CKMBINDEX, TROPONINI in the last 168 hours. BNP: Invalid input(s): POCBNP CBG:  Recent Labs Lab 08/21/16 1257 08/21/16 1417 08/21/16 1703 08/21/16 2113 08/22/16 0811  GLUCAP 60* 97 120* 161* 509*    Radiological Exams on Admission: No results found.  EKG:   Time spent: 60 minutes  [facility] M [facility]   If 7PM-7AM, please contact night-coverage [email or web address] Password [facility] 08/22/2016, 10:27 AM

## 2016-08-22 NOTE — Progress Notes (Signed)
Patient ID: Raymond Winters, male   DOB: April 01, 1961, 56 y.o.   MRN: [id number]  [facility]  3 Days Post-Op  Subjective: Pt blood sugar 509 this AM.  Has been dropping into 50s during day at lunchtime.    Micro shows klebsiella.    Objective: Vital signs in last 24 hours: Temp:  [98.6 F (37 C)-98.8 F (37.1 C)] 98.8 F (37.1 C) (03/03 0431) Pulse Rate:  [78-97] 78 (03/03 0431) Resp:  [16-18] 18 (03/03 0431) BP: (107-119)/(73-78) 119/78 (03/03 0431) SpO2:  [100 %] 100 % (03/03 0431) Last BM Date: 08/21/16  Intake/Output from previous day: 03/02 0701 - 03/03 0700 In: 2050 [P.O.:1800; IV Piggyback:250] Out: 2100 [Urine:2050; Drains:50] Intake/Output this shift: Total I/O In: -  Out: 850 [Urine:850]  PE: Gen:  Cachectic, alert, NAD, pleasant Pulm:  Effort normal Abd: Soft, ND Skin: vac in place.  No surrounding cellulitis   Lab Results:   Recent Labs  08/19/16 1546 08/20/16 0432  WBC 6.9 6.0  HGB 11.6* 10.0*  HCT 34.6* 30.4*  PLT 179 158   BMET  Recent Labs  08/19/16 1546 08/20/16 0432 08/22/16 0528  NA 133* 131*  --   K 3.8 3.6  --   CL 98* 99*  --   CO2 22 24  --   GLUCOSE 276* 406*  --   BUN 10 7  --   CREATININE 1.07 1.01 1.17  CALCIUM 8.5* 7.8*  --    PT/INR  Recent Labs  08/19/16 2013  LABPROT 15.5*  INR 1.22   CMP     Component Value Date/Time   NA 131 (L) 08/20/2016 0432   K 3.6 08/20/2016 0432   CL 99 (L) 08/20/2016 0432   CO2 24 08/20/2016 0432   GLUCOSE 406 (H) 08/20/2016 0432   BUN 7 08/20/2016 0432   CREATININE 1.17 08/22/2016 0528   CREATININE 1.16 06/29/2016 0925   CALCIUM 7.8 (L) 08/20/2016 0432   PROT 6.3 (L) 08/19/2016 2013   ALBUMIN 2.2 (L) 08/19/2016 2013   AST 23 08/19/2016 2013   ALT 11 (L) 08/19/2016 2013   ALKPHOS 64 08/19/2016 2013   BILITOT 0.6 08/19/2016 2013   GFRNONAA >60 08/22/2016 0528   GFRNONAA 70 06/29/2016 0925   GFRAA >60 08/22/2016 0528   GFRAA 80 06/29/2016 0925    Lipase     Component Value Date/Time   LIPASE 16 09/15/2015 1700       Studies/Results: No results found.  Anti-infectives: Anti-infectives    Start     Dose/Rate Route Frequency Ordered Stop   08/22/16 1000  amoxicillin-clavulanate (AUGMENTIN) 875-125 MG per tablet 1 tablet     1 tablet Oral Every 12 hours 08/22/16 0906     08/20/16 0800  vancomycin (VANCOCIN) IVPB 750 mg/150 ml premix  Status:  Discontinued     750 mg 150 mL/hr over 60 Minutes Intravenous Every 12 hours 08/20/16 0033 08/22/16 0905   08/20/16 0100  piperacillin-tazobactam (ZOSYN) IVPB 3.375 g  Status:  Discontinued     3.375 g 12.5 mL/hr over 240 Minutes Intravenous Every 8 hours 08/20/16 0033 08/22/16 0905   08/19/16 1645  vancomycin (VANCOCIN) IVPB 1000 mg/200 mL premix     1,000 mg 200 mL/hr over 60 Minutes Intravenous  Once 08/19/16 1639 08/19/16 2128   08/19/16 1645  piperacillin-tazobactam (ZOSYN) IVPB 3.375 g     3.375 g 100 mL/hr over 30 Minutes Intravenous  Once 08/19/16 1639 08/19/16 2014  Assessment/Plan Necrotizing fasciitis right buttock Incision and debridement of right buttock necrotizing fasciitis - skin, subcutaneous tissue, fascia, muscle - total wound area 20 x 10 cm x 3 cm deep 2/28 Dr. Corliss Skains - POD 3 - culture pending, gram stain growing gram + rods and cocci in pairs.  Some klebsiella sp.  No surrounding cellulitis.  Will d/c vanc/zosyn and add augmentin.   - afebrile  IDDM - wide swings in blood sugar are concerning.  The super high values put him at risk for additional infectious issues.  The lows put him at risk for seizing.  Have consulted medicine to assist with medication adjustment.    Hepatitis C Heroin abuse  ID - vanco 2/28>>, zosyn 2/28>> FEN - carb modified VTE - SCDs  Plan - medicine consult for unstable DM.  Switch to augmentin.  Plan d/c Monday after wound vac change again.  Hope blood sugars can be more stable at that point.       LOS: 3 days     Raymond Winters , MD [facility] 08/22/2016, 9:37 AM

## 2016-08-22 NOTE — Progress Notes (Signed)
PHARMACY - PHYSICIAN COMMUNICATION CRITICAL VALUE ALERT - BLOOD CULTURE IDENTIFICATION (BCID)  Results for orders placed or performed during the hospital encounter of 08/19/16  Blood Culture ID Panel (Reflexed) (Collected: 08/19/2016  4:50 PM)  Result Value Ref Range   Enterococcus species NOT DETECTED NOT DETECTED   Listeria monocytogenes NOT DETECTED NOT DETECTED   Staphylococcus species NOT DETECTED NOT DETECTED   Staphylococcus aureus NOT DETECTED NOT DETECTED   Streptococcus species NOT DETECTED NOT DETECTED   Streptococcus agalactiae NOT DETECTED NOT DETECTED   Streptococcus pneumoniae NOT DETECTED NOT DETECTED   Streptococcus pyogenes NOT DETECTED NOT DETECTED   Acinetobacter baumannii NOT DETECTED NOT DETECTED   Enterobacteriaceae species NOT DETECTED NOT DETECTED   Enterobacter cloacae complex NOT DETECTED NOT DETECTED   Escherichia coli NOT DETECTED NOT DETECTED   Klebsiella oxytoca NOT DETECTED NOT DETECTED   Klebsiella pneumoniae NOT DETECTED NOT DETECTED   Proteus species NOT DETECTED NOT DETECTED   Serratia marcescens NOT DETECTED NOT DETECTED   Haemophilus influenzae NOT DETECTED NOT DETECTED   Neisseria meningitidis NOT DETECTED NOT DETECTED   Pseudomonas aeruginosa NOT DETECTED NOT DETECTED   Candida albicans NOT DETECTED NOT DETECTED   Candida glabrata NOT DETECTED NOT DETECTED   Candida krusei NOT DETECTED NOT DETECTED   Candida parapsilosis NOT DETECTED NOT DETECTED   Candida tropicalis NOT DETECTED NOT DETECTED    Name of physician (or Provider) Contacted: Dr. Dwain SarnaWakefield   Changes to prescribed antibiotics required: No organisms detected on BCID, gram positive rods and gram negative rods seen on gram stain in 1 pediatric bottle. The other pediatric bottle is NG x2 days. Patient is currently on vanc/zosyn. De-escalate as clinically indicated.   Carylon PerchesMaggie Shuda, PharmD Acute Care Pharmacy Resident  Pager: [phone] 08/22/2016

## 2016-08-22 NOTE — Progress Notes (Addendum)
Inpatient Diabetes Program Recommendations  AACE/ADA: New Consensus Statement on Inpatient Glycemic Control (2015)  Target Ranges:  Prepandial:   less than 140 mg/dL      Peak postprandial:   less than 180 mg/dL (1-2 hours)      Critically ill patients:  140 - 180 mg/dL   Results for Larinda ButteryMIX, Ashan (MRN [id number]) as of 08/22/2016 09:55  Ref. Range 08/20/2016 07:49 08/20/2016 12:13 08/20/2016 16:39 08/20/2016 21:08  Glucose-Capillary Latest Ref Range: 65 - 99 mg/dL 119436 (H) 147108 (H) 96 829146 (H)   Results for Larinda ButteryMIX, Yusef (MRN [id number]) as of 08/22/2016 09:55  Ref. Range 08/21/2016 07:54 08/21/2016 11:27 08/21/2016 12:19 08/21/2016 12:57 08/21/2016 14:17 08/21/2016 17:03 08/21/2016 21:13  Glucose-Capillary Latest Ref Range: 65 - 99 mg/dL 784401 (H) 57 (L) 59 (L) 60 (L) 97 120 (H) 161 (H)   Results for Larinda ButteryMIX, Latrell (MRN [id number]) as of 08/22/2016 09:55  Ref. Range 08/22/2016 08:11  Glucose-Capillary Latest Ref Range: 65 - 99 mg/dL 132509 [facility])   Results for Larinda ButteryMIX, Trennon (MRN [id number]) as of 08/22/2016 09:55  Ref. Range 06/23/2016 05:21  Hemoglobin A1C Latest Ref Range: 4.8 - 5.6 % 7.8 (H)    Home DM Meds: Lantus 25 units QHS       Novolog 0-12 units TID per SSI  Current Insulin Orders: Lantus 25 units QHS      Novolog Sensitive Correction Scale/ SSI (0-9 units) TID AC + HS      Novolog 4 units TID with meals      -Note patient having issues with Severe Hyperglycemia in the AM.  Per MD/PA notes from 03/02, patient was being allowed to eat 3 ice cream cups during the night.  This likely is contributing to pt's uncontrolled AM glucose levels.  -Note patient has been getting Lantus during the morning.  Dose has been switched to night time dosing as of today.  Will not get Lantus until tonight.  -Patient did have Hypoglycemia on 03/02 at lunch time after receiving 12 units Novolog at breakfast (9 units SSI + 3 units Meal Coverage).    MD- Note Lantus dosing switched to night time as of today.  Hopefully RNs will not allow pt to eat  ice cream throughout the night.  May consider increasing Lantus dose to 30 units QHS (may want to give pt his dose today earlier than bedtime since he has been getting his Lantus in the morning-- maybe give Lantus at 5pm today and then start bedtime dosing on 03/04)    --Will follow patient during hospitalization--  Ambrose FinlandJeannine Johnston Erie Sica RN, MSN, CDE Diabetes Coordinator Inpatient Glycemic Control Team Team Pager: [phone] (8a-5p)

## 2016-08-22 NOTE — Progress Notes (Signed)
MD made aware of increased BS. Will give insulin per order and MD stated that she will get medicine involve.

## 2016-08-23 DIAGNOSIS — E43 Unspecified severe protein-calorie malnutrition: Secondary | ICD-10-CM

## 2016-08-23 DIAGNOSIS — L0291 Cutaneous abscess, unspecified: Secondary | ICD-10-CM

## 2016-08-23 DIAGNOSIS — E1100 Type 2 diabetes mellitus with hyperosmolarity without nonketotic hyperglycemic-hyperosmolar coma (NKHHC): Secondary | ICD-10-CM

## 2016-08-23 DIAGNOSIS — F1110 Opioid abuse, uncomplicated: Secondary | ICD-10-CM

## 2016-08-23 DIAGNOSIS — K860 Alcohol-induced chronic pancreatitis: Secondary | ICD-10-CM

## 2016-08-23 DIAGNOSIS — F1010 Alcohol abuse, uncomplicated: Secondary | ICD-10-CM

## 2016-08-23 DIAGNOSIS — M726 Necrotizing fasciitis: Principal | ICD-10-CM

## 2016-08-23 LAB — HEMOGLOBIN A1C
Hgb A1c MFr Bld: 10.9 % — ABNORMAL HIGH (ref 4.8–5.6)
MEAN PLASMA GLUCOSE: 266 mg/dL

## 2016-08-23 LAB — GLUCOSE, CAPILLARY
GLUCOSE-CAPILLARY: 245 mg/dL — AB (ref 65–99)
GLUCOSE-CAPILLARY: 161 mg/dL — AB (ref 65–99)
Glucose-Capillary: 241 mg/dL — ABNORMAL HIGH (ref 65–99)
Glucose-Capillary: 155 mg/dL — ABNORMAL HIGH (ref 65–99)

## 2016-08-23 NOTE — Progress Notes (Signed)
PROGRESS NOTE  Raymond ButteryGary Winters  ZOX:[id number] DOB: 23-Apr-1961 DOA: 08/19/2016 PCP: Jaclyn ShaggyEnobong, Amao, MD Outpatient Specialists:  Subjective: Feels okay, denies any complaints this morning. Sugars 241 this morning, Lantus insulin was not given last night because blood sugar was 76  Brief Narrative:  Patient is a 56 year old male with history of substance abuse, chronic pancreatitis, hepatitis C, IV disease admitted to general surgery service on February 28 the chief complaint right buttock abscess.   According to chart prior to patient's admission he had fallen and landed on his right hip and was on the ground in that position for greater than 30 minutes. States his brother assisted him up and shortly thereafter area on his right hip became bruised and swollen and painful. Came to the hospital with complaints of worsening pain erythema and swelling. He was admitted by general surgery for large soft tissue infection likely secondary to infected hematoma. He underwent emergent debridement of the right buttock soft tissue infection. He was provided with vancomycin and Zosyn.  He underwent twice a day wet-to-dry dressings for 3 days. Wound VAC recommended and antibiotics changed to oral on March 3. He experienced fluctuations in his capillary blood sugar [facility] were asked to consult for improved diabetes control  Assessment & Plan:   Principal Problem:   DM2 (diabetes mellitus, type 2) (HCC) Active Problems:   Heroin abuse   Alcohol abuse   Protein-calorie malnutrition, severe (HCC)   Abscess   Chronic alcoholic pancreatitis (HCC)   Necrotizing fasciitis (HCC)   Diabetes type 2, uncontrolled -Uncontrolled during hospitalization.  Likely multifactorial i.e. Infectious process, unreliable appetite and scheduling of insulin. -Restart at home regimen of 25 units of Lantus at bedtime and 4 units of NovoLog with meals. -Discontinue nighttime insulin coverage, do not hold long-acting  insulin, if need to please contact M.D.  Necrotizing fasciitis/abscess -Status post I%D on February 28 per general surgery -antibiotics changed to oral today -obtain cbc  Protien-calorie malnutrition -Likely related to history of EtOH abuse and polysubstance abuse. BMI 20.6.   -Per evaluation of the registered dietitian, continue routine supplements.  EtOH abuse -Patient denies any further drinking. no Signs symptoms of withdrawal.  Heroin use -Chart review indicates patient admitted to "shooting up" with heroin a week prior to his fall. Of note he underwent extensive surgical debridement to his left shoulder last year secondary to a soft tissue infection related to IV drug abuse. -social work  DVT prophylaxis:  Code Status: Full Code Family Communication:  Disposition Plan:  Diet: Diet Carb Modified Fluid consistency: Thin; Room service appropriate? Yes  Consultants:   TRH  Procedures:   Incision and drainage wound VAC placement  Antimicrobials:   Augmentin   Objective: Vitals:   08/22/16 0431 08/22/16 1456 08/22/16 2325 08/23/16 0652  BP: 119/78 99/75 109/65 109/83  Pulse: 78 89 91 83  Resp: 18 16 17 18   Temp: 98.8 F (37.1 C) 98.4 F (36.9 C) 98.4 F (36.9 C) 98.4 F (36.9 C)  TempSrc: Oral Oral Oral Oral  SpO2: 100% 100% 100% 99%  Weight:      Height:        Intake/Output Summary (Last 24 hours) at 08/23/16 0940 Last data filed at 08/23/16 0934  Gross per 24 hour  Intake              698 ml  Output             1115 ml  Net             -  417 ml   Filed Weights   08/19/16 1316  Weight: 59 kg (130 lb)    Examination: General exam: Appears calm and comfortable  Respiratory system: Clear to auscultation. Respiratory effort normal. Cardiovascular system: S1 & S2 heard, RRR. No JVD, murmurs, rubs, gallops or clicks. No pedal edema. Gastrointestinal system: Abdomen is nondistended, soft and nontender. No organomegaly or masses felt. Normal bowel  sounds heard. Central nervous system: Alert and oriented. No focal neurological deficits. Extremities: Symmetric 5 x 5 power. Skin: No rashes, lesions or ulcers Psychiatry: Judgement and insight appear normal. Mood & affect appropriate.   Data Reviewed: I have personally reviewed following labs and imaging studies  CBC:  Recent Labs Lab 08/19/16 1546 08/20/16 0432 08/22/16 1012  WBC 6.9 6.0 3.5*  HGB 11.6* 10.0* 9.6*  HCT 34.6* 30.4* 29.8*  MCV 76.5* 75.8* 78.2  PLT 179 158 209   Basic Metabolic Panel:  Recent Labs Lab 08/19/16 1546 08/20/16 0432 08/22/16 0528 08/22/16 1012  NA 133* 131*  --  138  K 3.8 3.6  --  3.8  CL 98* 99*  --  105  CO2 22 24  --  26  GLUCOSE 276* 406*  --  285*  BUN 10 7  --  6  CREATININE 1.07 1.01 1.17 1.23  CALCIUM 8.5* 7.8*  --  8.1*   GFR: Estimated Creatinine Clearance: 55.3 mL/min (by C-G formula based on SCr of 1.23 mg/dL). Liver Function Tests:  Recent Labs Lab 08/19/16 2013 08/22/16 1012  AST 23 32  ALT 11* 13*  ALKPHOS 64 45  BILITOT 0.6 0.5  PROT 6.3* 5.1*  ALBUMIN 2.2* 1.7*   No results for input(s): LIPASE, AMYLASE in the last 168 hours. No results for input(s): AMMONIA in the last 168 hours. Coagulation Profile:  Recent Labs Lab 08/19/16 2013  INR 1.22   Cardiac Enzymes: No results for input(s): CKTOTAL, CKMB, CKMBINDEX, TROPONINI in the last 168 hours. BNP (last 3 results) No results for input(s): PROBNP in the last 8760 hours. HbA1C: No results for input(s): HGBA1C in the last 72 hours. CBG:  Recent Labs Lab 08/22/16 0811 08/22/16 1241 08/22/16 1714 08/22/16 2130 08/23/16 0726  GLUCAP 509* 137* 76 258* 241*   Lipid Profile: No results for input(s): CHOL, HDL, LDLCALC, TRIG, CHOLHDL, LDLDIRECT in the last 72 hours. Thyroid Function Tests: No results for input(s): TSH, T4TOTAL, FREET4, T3FREE, THYROIDAB in the last 72 hours. Anemia Panel: No results for input(s): VITAMINB12, FOLATE, FERRITIN,  TIBC, IRON, RETICCTPCT in the last 72 hours. Urine analysis:    Component Value Date/Time   COLORURINE YELLOW 01/03/2016 2330   APPEARANCEUR CLEAR 01/03/2016 2330   LABSPEC 1.026 01/03/2016 2330   PHURINE 5.5 01/03/2016 2330   GLUCOSEU >1000 (A) 01/03/2016 2330   HGBUR NEGATIVE 01/03/2016 2330   BILIRUBINUR NEGATIVE 01/03/2016 2330   BILIRUBINUR neg 09/26/2015 1531   KETONESUR NEGATIVE 01/03/2016 2330   PROTEINUR NEGATIVE 01/03/2016 2330   UROBILINOGEN 0.2 09/26/2015 1531   UROBILINOGEN 0.2 12/05/2013 0721   NITRITE NEGATIVE 01/03/2016 2330   LEUKOCYTESUR NEGATIVE 01/03/2016 2330   Sepsis Labs: @LABRCNTIP (procalcitonin:4,lacticidven:4)  ) Recent Results (from the past 240 hour(s))  Blood culture (routine x 2)     Status: None (Preliminary result)   Collection Time: 08/19/16  4:50 PM  Result Value Ref Range Status   Specimen Description BLOOD RIGHT FOREARM  Final   Special Requests IN PEDIATRIC BOTTLE 3CC  Final   Culture  Setup Time   Final  IN PEDIATRIC BOTTLE Organism ID to follow GRAM NEGATIVE RODS GRAM POSITIVE RODS REVIEWED BY [employer] CRITICAL RESULT CALLED TO, READ BACK BY AND VERIFIED WITH: Melvyn Neth, PHARMD @[id number]  08/22/16 MKELLY,MLT    Culture NO GROWTH 3 DAYS  Final   Report Status PENDING  Incomplete  Blood Culture ID Panel (Reflexed)     Status: None   Collection Time: 08/19/16  4:50 PM  Result Value Ref Range Status   Enterococcus species NOT DETECTED NOT DETECTED Final   Listeria monocytogenes NOT DETECTED NOT DETECTED Final   Staphylococcus species NOT DETECTED NOT DETECTED Final   Staphylococcus aureus NOT DETECTED NOT DETECTED Final   Streptococcus species NOT DETECTED NOT DETECTED Final   Streptococcus agalactiae NOT DETECTED NOT DETECTED Final   Streptococcus pneumoniae NOT DETECTED NOT DETECTED Final   Streptococcus pyogenes NOT DETECTED NOT DETECTED Final   Acinetobacter baumannii NOT DETECTED NOT DETECTED Final   Enterobacteriaceae species NOT  DETECTED NOT DETECTED Final   Enterobacter cloacae complex NOT DETECTED NOT DETECTED Final   Escherichia coli NOT DETECTED NOT DETECTED Final   Klebsiella oxytoca NOT DETECTED NOT DETECTED Final   Klebsiella pneumoniae NOT DETECTED NOT DETECTED Final   Proteus species NOT DETECTED NOT DETECTED Final   Serratia marcescens NOT DETECTED NOT DETECTED Final   Haemophilus influenzae NOT DETECTED NOT DETECTED Final   Neisseria meningitidis NOT DETECTED NOT DETECTED Final   Pseudomonas aeruginosa NOT DETECTED NOT DETECTED Final   Candida albicans NOT DETECTED NOT DETECTED Final   Candida glabrata NOT DETECTED NOT DETECTED Final   Candida krusei NOT DETECTED NOT DETECTED Final   Candida parapsilosis NOT DETECTED NOT DETECTED Final   Candida tropicalis NOT DETECTED NOT DETECTED Final  Blood culture (routine x 2)     Status: None (Preliminary result)   Collection Time: 08/19/16  7:30 PM  Result Value Ref Range Status   Specimen Description BLOOD RIGHT HAND  Final   Special Requests IN PEDIATRIC BOTTLE 3CC  Final   Culture NO GROWTH 3 DAYS  Final   Report Status PENDING  Incomplete  Aerobic/Anaerobic Culture (surgical/deep wound)     Status: None (Preliminary result)   Collection Time: 08/19/16 10:18 PM  Result Value Ref Range Status   Specimen Description ABSCESS RIGHT BUTTOCKS  Final   Special Requests PATIENT ON FOLLOWING VANC AND ZOSYN  Final   Gram Stain   Final    ABUNDANT WBC PRESENT,BOTH PMN AND MONONUCLEAR ABUNDANT GRAM POSITIVE RODS FEW GRAM POSITIVE COCCI IN PAIRS    Culture   Final    FEW KLEBSIELLA PNEUMONIAE NO ANAEROBES ISOLATED; CULTURE IN PROGRESS FOR 5 DAYS    Report Status PENDING  Incomplete   Organism ID, Bacteria KLEBSIELLA PNEUMONIAE  Final      Susceptibility   Klebsiella pneumoniae - MIC*    AMPICILLIN RESISTANT Resistant     CEFAZOLIN <=4 SENSITIVE Sensitive     CEFEPIME <=1 SENSITIVE Sensitive     CEFTAZIDIME <=1 SENSITIVE Sensitive     CEFTRIAXONE <=1  SENSITIVE Sensitive     CIPROFLOXACIN <=0.25 SENSITIVE Sensitive     GENTAMICIN <=1 SENSITIVE Sensitive     IMIPENEM <=0.25 SENSITIVE Sensitive     TRIMETH/SULFA <=20 SENSITIVE Sensitive     AMPICILLIN/SULBACTAM <=2 SENSITIVE Sensitive     PIP/TAZO <=4 SENSITIVE Sensitive     Extended ESBL NEGATIVE Sensitive     * FEW KLEBSIELLA PNEUMONIAE     Invalid input(s): PROCALCITONIN, LACTICACIDVEN   Radiology Studies: No results found.  Scheduled Meds: . amoxicillin-clavulanate  1 tablet Oral Q12H  . atorvastatin  40 mg Oral Daily  . enoxaparin (LOVENOX) injection  40 mg Subcutaneous Q24H  . feeding supplement (GLUCERNA SHAKE)  237 mL Oral TID BM  . gabapentin  300 mg Oral QHS  . insulin aspart  0-5 Units Subcutaneous QHS  . insulin aspart  0-9 Units Subcutaneous TID WC  . insulin aspart  4 Units Subcutaneous TID WC  . insulin glargine  25 Units Subcutaneous Once  . insulin glargine  25 Units Subcutaneous QHS  . lipase/protease/amylase  24,000 Units Oral TID AC  . mirtazapine  15 mg Oral QHS  . multivitamin with minerals  1 tablet Oral Q2200   Continuous Infusions:   LOS: 4 days    Time spent: 35 minutes    Keigan Tafoya A, MD [facility] Pager [phone]  If 7PM-7AM, please contact night-coverage [email or web address] Password [email or web address] 08/23/2016, 9:40 AM

## 2016-08-23 NOTE — Progress Notes (Signed)
4 Days Post-Op  Subjective: Pain fairly well controlled No problems with negative pressure dressing No fevers.  No tachycardia.  WBC 3500. CBG 241 this morning  We appreciate the consultation and intervention for his diabetes and other medical problems by [facility] hospitalist.   Objective: Vital signs in last 24 hours: Temp:  [98.4 F (36.9 C)] 98.4 F (36.9 C) (03/04 0652) Pulse Rate:  [83-91] 83 (03/04 0652) Resp:  [16-18] 18 (03/04 0652) BP: (99-109)/(65-83) 109/83 (03/04 0652) SpO2:  [99 %-100 %] 99 % (03/04 0652) Last BM Date: 08/22/16  Intake/Output from previous day: 03/03 0701 - 03/04 0700 In: 698 [P.O.:698] Out: 1740 [Urine:1740] Intake/Output this shift: Total I/O In: -  Out: 225 [Urine:225]    EXAM: General appearance: Alert.  Cooperative.  Mental status reasonably normal.  No toxicity. Extremities: Large linear wound overlying right gluteal and greater trochanter ever area.  Minimal tenderness.  No surrounding cellulitis.  Drainage is clear serosanguineous. Abdomen: Soft.  Nontender.  Lab Results:  Results for orders placed or performed during the hospital encounter of 08/19/16 (from the past 24 hour(s))  Comprehensive metabolic panel     Status: Abnormal   Collection Time: 08/22/16 10:12 AM  Result Value Ref Range   Sodium 138 135 - 145 mmol/L   Potassium 3.8 3.5 - 5.1 mmol/L   Chloride 105 101 - 111 mmol/L   CO2 26 22 - 32 mmol/L   Glucose, Bld 285 (H) 65 - 99 mg/dL   BUN 6 6 - 20 mg/dL   Creatinine, Ser 6.04 0.61 - 1.24 mg/dL   Calcium 8.1 (L) 8.9 - 10.3 mg/dL   Total Protein 5.1 (L) 6.5 - 8.1 g/dL   Albumin 1.7 (L) 3.5 - 5.0 g/dL   AST 32 15 - 41 U/L   ALT 13 (L) 17 - 63 U/L   Alkaline Phosphatase 45 38 - 126 U/L   Total Bilirubin 0.5 0.3 - 1.2 mg/dL   GFR calc non Af Amer >60 >60 mL/min   GFR calc Af Amer >60 >60 mL/min   Anion gap 7 5 - 15  CBC     Status: Abnormal   Collection Time: 08/22/16 10:12 AM  Result Value Ref Range   WBC 3.5 (L)  4.0 - 10.5 K/uL   RBC 3.81 (L) 4.22 - 5.81 MIL/uL   Hemoglobin 9.6 (L) 13.0 - 17.0 g/dL   HCT 54.0 (L) 98.1 - 19.1 %   MCV 78.2 78.0 - 100.0 fL   MCH 25.2 (L) 26.0 - 34.0 pg   MCHC 32.2 30.0 - 36.0 g/dL   RDW 47.8 29.5 - 62.1 %   Platelets 209 150 - 400 K/uL  Glucose, capillary     Status: Abnormal   Collection Time: 08/22/16 12:41 PM  Result Value Ref Range   Glucose-Capillary 137 (H) 65 - 99 mg/dL  Glucose, capillary     Status: None   Collection Time: 08/22/16  5:14 PM  Result Value Ref Range   Glucose-Capillary 76 65 - 99 mg/dL  Glucose, capillary     Status: Abnormal   Collection Time: 08/22/16  9:30 PM  Result Value Ref Range   Glucose-Capillary 258 (H) 65 - 99 mg/dL  Glucose, capillary     Status: Abnormal   Collection Time: 08/23/16  7:26 AM  Result Value Ref Range   Glucose-Capillary 241 (H) 65 - 99 mg/dL     Studies/Results: No results found.  Marland Kitchen amoxicillin-clavulanate  1 tablet Oral Q12H  . atorvastatin  40 mg Oral Daily  . enoxaparin (LOVENOX) injection  40 mg Subcutaneous Q24H  . feeding supplement (GLUCERNA SHAKE)  237 mL Oral TID BM  . gabapentin  300 mg Oral QHS  . insulin aspart  0-5 Units Subcutaneous QHS  . insulin aspart  0-9 Units Subcutaneous TID WC  . insulin aspart  4 Units Subcutaneous TID WC  . insulin glargine  25 Units Subcutaneous Once  . insulin glargine  25 Units Subcutaneous QHS  . lipase/protease/amylase  24,000 Units Oral TID AC  . mirtazapine  15 mg Oral QHS  . multivitamin with minerals  1 tablet Oral Q2200     Assessment/Plan: s/p Procedure(s): DEBRIDEMENT RIGHT BUTTOCK  ABSCESS  Necrotizing fasciitis right buttock Incision and debridement of right buttock necrotizing fasciitis - skin, subcutaneous tissue, fascia, muscle - total wound area 20 x 10 cm x 3 cm deep 2/28 Dr. Corliss Skainssuei - POD 3 - culture pending, gram stain growing gram + rods and gram positive cocci in pairs and Klebsiella(sensitive to Augmentin).    -continue  Augmentin  -Check cultures tomorrow   - afebrile  IDDM - blood sugars less, coming under control.  From the point of view of soft tissue infection and control of wound healing, would like to see his CBGs sustainably less than 180.  Hepatitis C Heroin abuse  ID - vanco 2/28>>3/3;  ,    zosyn 2/28>>3/3;    Augmentin start 3/3 FEN - carb modified VTE - SCDs  Plan - medicine consult for unstable DM.              Change VAC Monday Wednesday Friday.            Case management has arranged [facility] to manage negative pressure dressing            Possible discharge home tomorrow if wound looks good at dressing change  @PROBHOSP @  LOS: 4 days    Jeneane Pieczynski M 08/23/2016  . .prob

## 2016-08-24 LAB — GLUCOSE, CAPILLARY
GLUCOSE-CAPILLARY: 92 mg/dL (ref 65–99)
GLUCOSE-CAPILLARY: 152 mg/dL — AB (ref 65–99)
Glucose-Capillary: 314 mg/dL — ABNORMAL HIGH (ref 65–99)
Glucose-Capillary: 231 mg/dL — ABNORMAL HIGH (ref 65–99)

## 2016-08-24 LAB — CULTURE, BLOOD (ROUTINE X 2): CULTURE: NO GROWTH

## 2016-08-24 MED ORDER — INSULIN GLARGINE 100 UNIT/ML ~~LOC~~ SOLN
30.0000 [IU] | Freq: Every day | SUBCUTANEOUS | Status: DC
  Administered 2016-08-24: 30 [IU] via SUBCUTANEOUS
  Filled 2016-08-24 (×2): qty 0.3

## 2016-08-24 MED ORDER — INSULIN ASPART 100 UNIT/ML ~~LOC~~ SOLN
5.0000 [IU] | Freq: Three times a day (TID) | SUBCUTANEOUS | Status: DC
  Administered 2016-08-24 – 2016-08-25 (×2): 5 [IU] via SUBCUTANEOUS

## 2016-08-24 NOTE — Consult Note (Addendum)
WOC Nurse wound consult note Pt is followed by surgical team for assessment and plan of care. Reason for Consult:  Vac dressing change to right hip/upper thigh Wound type: Full thickness post-op wound  Wound bed: beefy red,  Drainage (amount, consistency, odor) Scant amt bleeding at 12:00 o'clock of wound bed when dressing was removed; mod amt pink drainage, no odor Periwound: intact skin surrounding Dressing procedure/placement/frequency: Pt medicated for pain prior to procedure and tolerated with mod amt discomfort.  Mepitel contact layer in place to assist with decreased pain with next dressing change and avoid adherence of dressings. One piece of black foam applied with 125mm cont suction.  Plan to change dressing on Wed if patient is still in the hospital at that time.   Cammie Mcgeeawn Caliope Ruppert MSN, RN, CWOCN, [place], CNS [phone]

## 2016-08-24 NOTE — Evaluation (Signed)
Physical Therapy Evaluation Patient Details Name: Raymond ButteryGary Winters MRN: [id number] DOB: Dec 28, 1960 Today's Date: 08/24/2016   History of Present Illness  Pt is a 56 yo male who fell on his right hip and was admitted 08/19/16 with an abscess on his R buttocks. PMH includes uncontrolled DM, substance abuse, EOTH abuse, chronic pancreatitis, hepatitis C, IV disease     Clinical Impression  Patient evaluated by Physical Therapy with no further acute PT needs identified. All education has been completed and the patient has no further questions. Pt mobility is slowed by painful R hip but he is modified independent in bed mobility, and transfers and gait with a RW and supervision for stairs utilizing R handrail for ascend/descend of 14 stairs with no LoB or buckling of knees. See below for any follow-up Physical Therapy or equipment needs. PT is signing off. Thank you for this referral.    Follow Up Recommendations No PT follow up    Equipment Recommendations  3in1 (PT);Rolling walker with 5" wheels    Recommendations for Other Services       Precautions / Restrictions Precautions Precautions: Fall Precaution Comments: pt s/p fall  Restrictions Weight Bearing Restrictions: No      Mobility  Bed Mobility Overal bed mobility: Modified Independent             General bed mobility comments: Pt able to come to EoB with assist from handrails and HoB elevated  Transfers Overall transfer level: Modified independent Equipment used: Rolling walker (2 wheeled)             General transfer comment: vc for pushing off of bed surface but able to come to upright without assist  Ambulation/Gait Ambulation/Gait assistance: Modified independent (Device/Increase time) Ambulation Distance (Feet): 175 Feet (1x 50 feet 1 x 125 feet ) Assistive device: Rolling walker (2 wheeled) Gait Pattern/deviations: Step-through pattern;Decreased stance time - right;Decreased step length - right;Decreased step  length - left;Antalgic Gait velocity: slow Gait velocity interpretation: Below normal speed for age/gender General Gait Details: vc for upright posture, slow,steady and without LoB or buckl  Stairs Stairs: Yes Stairs assistance: Supervision Stair Management: One rail Right;Step to pattern (ascend and descend with rail on R side ) Number of Stairs: 14 General stair comments: slow and steady with decreased stance on R side      Balance Overall balance assessment: Modified Independent (no LoB, safe with RW support)                                           Pertinent Vitals/Pain Pain Assessment: 0-10 Pain Score: 8  Pain Location: R hip area  Pain Descriptors / Indicators: Constant;Sharp;Shooting Pain Intervention(s): Limited activity within patient's tolerance;Monitored during session;Patient requesting pain meds-RN notified  VSS    Home Living Family/patient expects to be discharged to:: Private residence Living Arrangements: Other relatives (Brother) Available Help at Discharge: Family;Available PRN/intermittently (brother available about 20 hours a day) Type of Home: Apartment Home Access: Stairs to enter;Level entry Entrance Stairs-Rails: None Entrance Stairs-Number of Steps: 1 Home Layout: One level Home Equipment: Grab bars - tub/shower;None      Prior Function Level of Independence: Independent         Comments: Pt was independent with ADLs, and iADLs and community ambulator        Extremity/Trunk Assessment        Lower Extremity Assessment Lower Extremity  Assessment: Defer to PT evaluation    Cervical / Trunk Assessment Cervical / Trunk Assessment: Normal  Communication   Communication: No difficulties  Cognition Arousal/Alertness: Awake/alert Behavior During Therapy: WFL for tasks assessed/performed Overall Cognitive Status: Difficult to assess                 General Comments: Pt forgot to tell PT that he was going to  his sisters house not his apartment after d/c           Assessment/Plan    PT Assessment Patent does not need any further PT services   End of Session Equipment Utilized During Treatment: Gait belt Activity Tolerance: Patient limited by pain;Patient limited by fatigue Patient left: in chair;with call bell/phone within reach Nurse Communication: Mobility status;Patient requests pain meds PT Visit Diagnosis: Pain;Other abnormalities of gait and mobility (R26.89) Pain - Right/Left: Right Pain - part of body: Hip         Time: 1610-9604 PT Time Calculation (min) (ACUTE ONLY): 67 min   Charges:   PT Evaluation $PT Eval Low Complexity: 1 Procedure PT Treatments $Gait Training: 23-37 mins $Therapeutic Activity: 8-22 mins   PT G Codes:         Elon Alas Fleet 08/24/2016, 3:31 PM  Gatlyn Lipari B. Beverely Risen PT, DPT Acute Rehabilitation  [phone] Pager [phone]

## 2016-08-24 NOTE — Progress Notes (Signed)
Physical Therapy Discharge Patient Details Name: Raymond Winters MRN: [id number] DOB: 01/26/1961 Today's Date: 08/24/2016 Time: 1358-1505 PT Time Calculation (min) (ACUTE ONLY): 67 min  Patient discharged from PT services secondary to goals met and no further PT needs identified.  Please see latest therapy progress note for current level of functioning and progress toward goals.    Progress and discharge plan discussed with patient and/or caregiver: Patient/Caregiver agrees with plan  GP     Elizabeth B Van Fleet 08/24/2016, 3:45 PM   Elizabeth B. Van Fleet PT, DPT Acute Rehabilitation  [phone] Pager [phone]  

## 2016-08-24 NOTE — Progress Notes (Signed)
5 Days Post-Op  [facility] Progress Note  Subjective: Patient feels well. Family expresses concern about patient being generally weak and deconditioned and plan for discharge to home. Family states there are 14 steps that patient must climb to enter residence. PT evaluation ordered to assess functional/safety status. Wound vac changed this morning.   Objective: Vital signs in last 24 hours: Temp:  [97.4 F (36.3 C)-98.8 F (37.1 C)] 97.4 F (36.3 C) (03/05 0533) Pulse Rate:  [78-92] 78 (03/05 0533) Resp:  [16-19] 16 (03/05 0533) BP: (92-116)/(62-71) 103/70 (03/05 0533) SpO2:  [99 %-100 %] 100 % (03/05 0533) Last BM Date: 08/22/16  Intake/Output from previous day: 03/04 0701 - 03/05 0700 In: 800 [P.O.:800] Out: 900 [Urine:850; Drains:50] Intake/Output this shift: Total I/O In: 120 [P.O.:120] Out: -   PE: General: pleasant, WD, AA white male who is laying in bed in NAD HEENT: head is normocephalic, atraumatic.  Sclera are non-icteric and clear.  Mouth is pink and moist Heart: regular, rate, and rhythm.   Lungs: CTAB.  Respiratory effort nonlabored Abd: soft, NT, ND, +BS. MS: Linear wound with wound vac in place at right lateral hip. Clear drainage in collection tubing. Mild induration and tenderness at wound edges. Skin: see above Psych: A&Ox3 with an appropriate affect.  Lab Results:   Recent Labs  08/22/16 1012  WBC 3.5*  HGB 9.6*  HCT 29.8*  PLT 209   BMET  Recent Labs  08/22/16 0528 08/22/16 1012  NA  --  138  K  --  3.8  CL  --  105  CO2  --  26  GLUCOSE  --  285*  BUN  --  6  CREATININE 1.17 1.23  CALCIUM  --  8.1*   PT/INR No results for input(s): LABPROT, INR in the last 72 hours. CMP     Component Value Date/Time   NA 138 08/22/2016 1012   K 3.8 08/22/2016 1012   CL 105 08/22/2016 1012   CO2 26 08/22/2016 1012   GLUCOSE 285 (H) 08/22/2016 1012   BUN 6 08/22/2016 1012   CREATININE 1.23 08/22/2016 1012   CREATININE 1.16  06/29/2016 0925   CALCIUM 8.1 (L) 08/22/2016 1012   PROT 5.1 (L) 08/22/2016 1012   ALBUMIN 1.7 (L) 08/22/2016 1012   AST 32 08/22/2016 1012   ALT 13 (L) 08/22/2016 1012   ALKPHOS 45 08/22/2016 1012   BILITOT 0.5 08/22/2016 1012   GFRNONAA >60 08/22/2016 1012   GFRNONAA 70 06/29/2016 0925   GFRAA >60 08/22/2016 1012   GFRAA 80 06/29/2016 0925   Lipase     Component Value Date/Time   LIPASE 16 09/15/2015 1700       Studies/Results: No results found.  Anti-infectives: Anti-infectives    Start     Dose/Rate Route Frequency Ordered Stop   08/22/16 1000  amoxicillin-clavulanate (AUGMENTIN) 875-125 MG per tablet 1 tablet     1 tablet Oral Every 12 hours 08/22/16 0906     08/20/16 0800  vancomycin (VANCOCIN) IVPB 750 mg/150 ml premix  Status:  Discontinued     750 mg 150 mL/hr over 60 Minutes Intravenous Every 12 hours 08/20/16 0033 08/22/16 0905   08/20/16 0100  piperacillin-tazobactam (ZOSYN) IVPB 3.375 g  Status:  Discontinued     3.375 g 12.5 mL/hr over 240 Minutes Intravenous Every 8 hours 08/20/16 0033 08/22/16 0905   08/19/16 1645  vancomycin (VANCOCIN) IVPB 1000 mg/200 mL premix     1,000 mg 200 mL/hr over  60 Minutes Intravenous  Once 08/19/16 1639 08/19/16 2128   08/19/16 1645  piperacillin-tazobactam (ZOSYN) IVPB 3.375 g     3.375 g 100 mL/hr over 30 Minutes Intravenous  Once 08/19/16 1639 08/19/16 2014       Assessment/Plan    LOS: 5 days  POD #4 s/p Procedure(s): DEBRIDEMENT RIGHT BUTTOCK  ABSCESS (2/28, Dr. Corliss Skains) Necrotizing fasciitis right buttock Incision and debridement of right buttock necrotizing fasciitis - skin, subcutaneous tissue, fascia, muscle - total wound area 20 x 10 cm x 3 cm deep  ID: wound culture results consistent with Klebsiella shown to be sensitive to Augmentin. Currently afebrile and without leukocytosis.  IDDM - blood glucose readings coming under control.  From the point of view of soft tissue infection and control of wound  healing, would like to see his CBGs sustainably less than 180.  Hepatitis C Heroin abuse  FEN - carb modified VTE - SCDs, Lovenox, ambulation  Plan: PT evaluation to assess for at home safety. Possible discharge to home later today following assessment. Will discuss with Dr. Harlon Flor number of days he would like patient to take Augmentin. [facility] to manage negative pressure dressing   LEE Ijanae Macapagal, [facility] 08/24/2016, 10:20 AM

## 2016-08-24 NOTE — Progress Notes (Signed)
PROGRESS NOTE  Raymond Winters  ZOX:[id number] DOB: 03/08/1961 DOA: 08/19/2016 PCP: Jaclyn Shaggy, MD Outpatient Specialists:  Subjective: Seen while the wound care nurse was applying the wound VAC. Blood sugar was 314 this morning.  Discharge recommendation for insulin regimen: Lantus insulin 30 units daily at bedtime. NovoLog insulin 5 units 3 times a day with meals   Brief Narrative:  Patient is a 56 year old male with history of substance abuse, chronic pancreatitis, hepatitis C, IV disease admitted to general surgery service on February 28 the chief complaint right buttock abscess.   According to chart prior to patient's admission he had fallen and landed on his right hip and was on the ground in that position for greater than 30 minutes. States his brother assisted him up and shortly thereafter area on his right hip became bruised and swollen and painful. Came to the hospital with complaints of worsening pain erythema and swelling. He was admitted by general surgery for large soft tissue infection likely secondary to infected hematoma. He underwent emergent debridement of the right buttock soft tissue infection. He was provided with vancomycin and Zosyn.  He underwent twice a day wet-to-dry dressings for 3 days. Wound VAC recommended and antibiotics changed to oral on March 3. He experienced fluctuations in his capillary blood sugar [facility] were asked to consult for improved diabetes control  Assessment & Plan:   Principal Problem:   DM2 (diabetes mellitus, type 2) (HCC) Active Problems:   Heroin abuse   Alcohol abuse   Protein-calorie malnutrition, severe (HCC)   Abscess   Chronic alcoholic pancreatitis (HCC)   Necrotizing fasciitis (HCC)   Diabetes type 2, uncontrolled -Uncontrolled during hospitalization.  Likely multifactorial i.e. Infectious process, unreliable appetite and scheduling of insulin. -Lantus changed 30 units and 5 units with meals of  NovoLog  Necrotizing fasciitis/abscess -Status post I%D on February 28 per general surgery -antibiotics changed to oral today -obtain cbc  Protien-calorie malnutrition -Likely related to history of EtOH abuse and polysubstance abuse. BMI 20.6.   -Per evaluation of the registered dietitian, continue routine supplements.  EtOH abuse -Patient denies any further drinking. no Signs symptoms of withdrawal.  Heroin use -Chart review indicates patient admitted to "shooting up" with heroin a week prior to his fall. Of note he underwent extensive surgical debridement to his left shoulder last year secondary to a soft tissue infection related to IV drug abuse. -social work  DVT prophylaxis:  Code Status: Full Code Family Communication:  Disposition Plan:  Diet: Diet Carb Modified Fluid consistency: Thin; Room service appropriate? Yes  Consultants:   TRH  Procedures:   Incision and drainage wound VAC placement  Antimicrobials:   Augmentin   Objective: Vitals:   08/23/16 0652 08/23/16 1447 08/23/16 2014 08/24/16 0533  BP: 109/83 116/71 92/62 103/70  Pulse: 83 91 92 78  Resp: 18 19 18 16   Temp: 98.4 F (36.9 C) 98.8 F (37.1 C) 98.5 F (36.9 C) 97.4 F (36.3 C)  TempSrc: Oral Oral Oral Oral  SpO2: 99% 100% 99% 100%  Weight:      Height:        Intake/Output Summary (Last 24 hours) at 08/24/16 1240 Last data filed at 08/24/16 1216  Gross per 24 hour  Intake              920 ml  Output              875 ml  Net  45 ml   Filed Weights   08/19/16 1316  Weight: 59 kg (130 lb)    Examination: General exam: Appears calm and comfortable  Respiratory system: Clear to auscultation. Respiratory effort normal. Cardiovascular system: S1 & S2 heard, RRR. No JVD, murmurs, rubs, gallops or clicks. No pedal edema. Gastrointestinal system: Abdomen is nondistended, soft and nontender. No organomegaly or masses felt. Normal bowel sounds heard. Central nervous  system: Alert and oriented. No focal neurological deficits. Extremities: Symmetric 5 x 5 power. Skin: No rashes, lesions or ulcers Psychiatry: Judgement and insight appear normal. Mood & affect appropriate.   Data Reviewed: I have personally reviewed following labs and imaging studies  CBC:  Recent Labs Lab 08/19/16 1546 08/20/16 0432 08/22/16 1012  WBC 6.9 6.0 3.5*  HGB 11.6* 10.0* 9.6*  HCT 34.6* 30.4* 29.8*  MCV 76.5* 75.8* 78.2  PLT 179 158 209   Basic Metabolic Panel:  Recent Labs Lab 08/19/16 1546 08/20/16 0432 08/22/16 0528 08/22/16 1012  NA 133* 131*  --  138  K 3.8 3.6  --  3.8  CL 98* 99*  --  105  CO2 22 24  --  26  GLUCOSE 276* 406*  --  285*  BUN 10 7  --  6  CREATININE 1.07 1.01 1.17 1.23  CALCIUM 8.5* 7.8*  --  8.1*   GFR: Estimated Creatinine Clearance: 55.3 mL/min (by C-G formula based on SCr of 1.23 mg/dL). Liver Function Tests:  Recent Labs Lab 08/19/16 2013 08/22/16 1012  AST 23 32  ALT 11* 13*  ALKPHOS 64 45  BILITOT 0.6 0.5  PROT 6.3* 5.1*  ALBUMIN 2.2* 1.7*   No results for input(s): LIPASE, AMYLASE in the last 168 hours. No results for input(s): AMMONIA in the last 168 hours. Coagulation Profile:  Recent Labs Lab 08/19/16 2013  INR 1.22   Cardiac Enzymes: No results for input(s): CKTOTAL, CKMB, CKMBINDEX, TROPONINI in the last 168 hours. BNP (last 3 results) No results for input(s): PROBNP in the last 8760 hours. HbA1C:  Recent Labs  08/22/16 1012  HGBA1C 10.9*   CBG:  Recent Labs Lab 08/23/16 1218 08/23/16 1833 08/23/16 2108 08/24/16 0741 08/24/16 1211  GLUCAP 155* 245* 161* 314* 92   Lipid Profile: No results for input(s): CHOL, HDL, LDLCALC, TRIG, CHOLHDL, LDLDIRECT in the last 72 hours. Thyroid Function Tests: No results for input(s): TSH, T4TOTAL, FREET4, T3FREE, THYROIDAB in the last 72 hours. Anemia Panel: No results for input(s): VITAMINB12, FOLATE, FERRITIN, TIBC, IRON, RETICCTPCT in the last 72  hours. Urine analysis:    Component Value Date/Time   COLORURINE YELLOW 01/03/2016 2330   APPEARANCEUR CLEAR 01/03/2016 2330   LABSPEC 1.026 01/03/2016 2330   PHURINE 5.5 01/03/2016 2330   GLUCOSEU >1000 (A) 01/03/2016 2330   HGBUR NEGATIVE 01/03/2016 2330   BILIRUBINUR NEGATIVE 01/03/2016 2330   BILIRUBINUR neg 09/26/2015 1531   KETONESUR NEGATIVE 01/03/2016 2330   PROTEINUR NEGATIVE 01/03/2016 2330   UROBILINOGEN 0.2 09/26/2015 1531   UROBILINOGEN 0.2 12/05/2013 0721   NITRITE NEGATIVE 01/03/2016 2330   LEUKOCYTESUR NEGATIVE 01/03/2016 2330   Sepsis Labs: @LABRCNTIP (procalcitonin:4,lacticidven:4)  ) Recent Results (from the past 240 hour(s))  Blood culture (routine x 2)     Status: None (Preliminary result)   Collection Time: 08/19/16  4:50 PM  Result Value Ref Range Status   Specimen Description BLOOD RIGHT FOREARM  Final   Special Requests IN PEDIATRIC BOTTLE 3CC  Final   Culture  Setup Time   Final  IN PEDIATRIC BOTTLE GRAM NEGATIVE RODS GRAM POSITIVE RODS REVIEWED BY [employer] CRITICAL RESULT CALLED TO, READ BACK BY AND VERIFIED WITH: Melvyn Neth, PHARMD @0704  08/22/16 MKELLY,MLT    Culture CULTURE REINCUBATED FOR BETTER GROWTH  Final   Report Status PENDING  Incomplete  Blood Culture ID Panel (Reflexed)     Status: None   Collection Time: 08/19/16  4:50 PM  Result Value Ref Range Status   Enterococcus species NOT DETECTED NOT DETECTED Final   Listeria monocytogenes NOT DETECTED NOT DETECTED Final   Staphylococcus species NOT DETECTED NOT DETECTED Final   Staphylococcus aureus NOT DETECTED NOT DETECTED Final   Streptococcus species NOT DETECTED NOT DETECTED Final   Streptococcus agalactiae NOT DETECTED NOT DETECTED Final   Streptococcus pneumoniae NOT DETECTED NOT DETECTED Final   Streptococcus pyogenes NOT DETECTED NOT DETECTED Final   Acinetobacter baumannii NOT DETECTED NOT DETECTED Final   Enterobacteriaceae species NOT DETECTED NOT DETECTED Final   Enterobacter  cloacae complex NOT DETECTED NOT DETECTED Final   Escherichia coli NOT DETECTED NOT DETECTED Final   Klebsiella oxytoca NOT DETECTED NOT DETECTED Final   Klebsiella pneumoniae NOT DETECTED NOT DETECTED Final   Proteus species NOT DETECTED NOT DETECTED Final   Serratia marcescens NOT DETECTED NOT DETECTED Final   Haemophilus influenzae NOT DETECTED NOT DETECTED Final   Neisseria meningitidis NOT DETECTED NOT DETECTED Final   Pseudomonas aeruginosa NOT DETECTED NOT DETECTED Final   Candida albicans NOT DETECTED NOT DETECTED Final   Candida glabrata NOT DETECTED NOT DETECTED Final   Candida krusei NOT DETECTED NOT DETECTED Final   Candida parapsilosis NOT DETECTED NOT DETECTED Final   Candida tropicalis NOT DETECTED NOT DETECTED Final  Blood culture (routine x 2)     Status: None (Preliminary result)   Collection Time: 08/19/16  7:30 PM  Result Value Ref Range Status   Specimen Description BLOOD RIGHT HAND  Final   Special Requests IN PEDIATRIC BOTTLE 3CC  Final   Culture NO GROWTH 4 DAYS  Final   Report Status PENDING  Incomplete  Aerobic/Anaerobic Culture (surgical/deep wound)     Status: None (Preliminary result)   Collection Time: 08/19/16 10:18 PM  Result Value Ref Range Status   Specimen Description ABSCESS RIGHT BUTTOCKS  Final   Special Requests PATIENT ON FOLLOWING VANC AND ZOSYN  Final   Gram Stain   Final    ABUNDANT WBC PRESENT,BOTH PMN AND MONONUCLEAR ABUNDANT GRAM POSITIVE RODS FEW GRAM POSITIVE COCCI IN PAIRS    Culture   Final    FEW KLEBSIELLA PNEUMONIAE HOLDING FOR POSSIBLE ANAEROBE    Report Status PENDING  Incomplete   Organism ID, Bacteria KLEBSIELLA PNEUMONIAE  Final      Susceptibility   Klebsiella pneumoniae - MIC*    AMPICILLIN RESISTANT Resistant     CEFAZOLIN <=4 SENSITIVE Sensitive     CEFEPIME <=1 SENSITIVE Sensitive     CEFTAZIDIME <=1 SENSITIVE Sensitive     CEFTRIAXONE <=1 SENSITIVE Sensitive     CIPROFLOXACIN <=0.25 SENSITIVE Sensitive      GENTAMICIN <=1 SENSITIVE Sensitive     IMIPENEM <=0.25 SENSITIVE Sensitive     TRIMETH/SULFA <=20 SENSITIVE Sensitive     AMPICILLIN/SULBACTAM <=2 SENSITIVE Sensitive     PIP/TAZO <=4 SENSITIVE Sensitive     Extended ESBL NEGATIVE Sensitive     * FEW KLEBSIELLA PNEUMONIAE     Invalid input(s): PROCALCITONIN, LACTICACIDVEN   Radiology Studies: No results found.      Scheduled Meds: . amoxicillin-clavulanate  1 tablet Oral Q12H  . atorvastatin  40 mg Oral Daily  . enoxaparin (LOVENOX) injection  40 mg Subcutaneous Q24H  . feeding supplement (GLUCERNA SHAKE)  237 mL Oral TID BM  . gabapentin  300 mg Oral QHS  . insulin aspart  0-9 Units Subcutaneous TID WC  . insulin aspart  4 Units Subcutaneous TID WC  . insulin glargine  25 Units Subcutaneous QHS  . lipase/protease/amylase  24,000 Units Oral TID AC  . mirtazapine  15 mg Oral QHS  . multivitamin with minerals  1 tablet Oral Q2200   Continuous Infusions:   LOS: 5 days    Time spent: 35 minutes    Telicia Hodgkiss A, MD [facility] Pager [phone]  If 7PM-7AM, please contact night-coverage [email or web address] Password [email or web address] 08/24/2016, 12:40 PM

## 2016-08-24 NOTE — Progress Notes (Signed)
Pt awake most of the time given dilaudid 2x, wound vac dry and intact, able to ambulate in the bathroom independently.

## 2016-08-25 LAB — GLUCOSE, CAPILLARY
GLUCOSE-CAPILLARY: 158 mg/dL — AB (ref 65–99)
GLUCOSE-CAPILLARY: 71 mg/dL (ref 65–99)

## 2016-08-25 MED ORDER — AMOXICILLIN-POT CLAVULANATE 875-125 MG PO TABS: 1.0000 | ORAL_TABLET | Freq: Two times a day (BID) | ORAL | 0 refills | Status: DC

## 2016-08-25 MED ORDER — OXYCODONE HCL 5 MG PO TABS: 5.0000 mg | ORAL_TABLET | Freq: Four times a day (QID) | ORAL | 0 refills | Status: DC | PRN

## 2016-08-25 MED ORDER — INSULIN ASPART 100 UNIT/ML FLEXPEN: 6.0000 [IU] | PEN_INJECTOR | Freq: Three times a day (TID) | SUBCUTANEOUS | 3 refills | Status: DC

## 2016-08-25 MED ORDER — INSULIN GLARGINE 100 UNIT/ML SOLOSTAR PEN: 30.0000 [IU] | PEN_INJECTOR | Freq: Every day | SUBCUTANEOUS | 3 refills | Status: DC

## 2016-08-25 MED FILL — !NOVOLOG 100UNITS/ML VIAL: 100/ML | 28 days supply | Qty: 10 | Fill #0

## 2016-08-25 NOTE — Progress Notes (Signed)
PROGRESS NOTE  Raymond Winters  QMV:[id number] DOB: [birth year] DOA: 08/19/2016 PCP: Jaclyn Shaggy, MD Outpatient Specialists:  Subjective: Seen while the wound care nurse was applying the wound VAC. Blood sugar was 314 this morning.  Discharge recommendation for insulin regimen: Lantus insulin 30 units daily at bedtime. NovoLog insulin 6 units 3 times a day with meals   Brief Narrative:  Patient is a 56 year old male with history of substance abuse, chronic pancreatitis, hepatitis C, IV disease admitted to general surgery service on February 28 the chief complaint right buttock abscess.   According to chart prior to patient's admission he had fallen and landed on his right hip and was on the ground in that position for greater than 30 minutes. States his brother assisted him up and shortly thereafter area on his right hip became bruised and swollen and painful. Came to the hospital with complaints of worsening pain erythema and swelling. He was admitted by general surgery for large soft tissue infection likely secondary to infected hematoma. He underwent emergent debridement of the right buttock soft tissue infection. He was provided with vancomycin and Zosyn.  He underwent twice a day wet-to-dry dressings for 3 days. Wound VAC recommended and antibiotics changed to oral on March 3. He experienced fluctuations in his capillary blood sugar [facility] were asked to consult for improved diabetes control  Assessment & Plan:   Principal Problem:   DM2 (diabetes mellitus, type 2) (HCC) Active Problems:   Heroin abuse   Alcohol abuse   Protein-calorie malnutrition, severe (HCC)   Abscess   Chronic alcoholic pancreatitis (HCC)   Necrotizing fasciitis (HCC)   Diabetes type 2, uncontrolled -Uncontrolled during hospitalization.  Likely multifactorial i.e. Infectious process, unreliable appetite and scheduling of insulin. -Lantus changed 30 units and 6 units with meals of  NovoLog  Necrotizing fasciitis/abscess -Status post I%D on February 28 per general surgery -antibiotics changed to oral today -obtain cbc  Protien-calorie malnutrition -Likely related to history of EtOH abuse and polysubstance abuse. BMI 20.6.   -Per evaluation of the registered dietitian, continue routine supplements.  EtOH abuse -Patient denies any further drinking. no Signs symptoms of withdrawal.  Heroin use -Chart review indicates patient admitted to "shooting up" with heroin a week prior to his fall. Of note he underwent extensive surgical debridement to his left shoulder last year secondary to a soft tissue infection related to IV drug abuse. -social work  DVT prophylaxis:  Code Status: Full Code Family Communication:  Disposition Plan:  Diet: Diet Carb Modified Fluid consistency: Thin; Room service appropriate? Yes  Consultants:   TR[facility]  Procedures:   Incision and drainage wound VAC placement  Antimicrobials:   Augmentin   Objective: Vitals:   08/24/16 0533 08/24/16 1551 08/24/16 2145 08/25/16 0553  BP: 103/70 92/64 99/63  101/72  Pulse: 78 (!) 105 88 83  Resp: 16 18 18 17   Temp: 97.4 F (36.3 C) 97.9 F (36.6 C) 98.2 F (36.8 C) 98.4 F (36.9 C)  TempSrc: Oral Oral Oral Oral  SpO2: 100% 99% 99% 100%  Weight:      Height:        Intake/Output Summary (Last 24 hours) at 08/25/16 1010 Last data filed at 08/25/16 0641  Gross per 24 hour  Intake              657 ml  Output             1200 ml  Net             -  543 ml   Filed Weights   08/19/16 1316  Weight: 59 kg (130 lb)    Examination: General exam: Appears calm and comfortable  Respiratory system: Clear to auscultation. Respiratory effort normal. Cardiovascular system: S1 & S2 heard, RRR. No JVD, murmurs, rubs, gallops or clicks. No pedal edema. Gastrointestinal system: Abdomen is nondistended, soft and nontender. No organomegaly or masses felt. Normal bowel sounds heard. Central nervous  system: Alert and oriented. No focal neurological deficits. Extremities: Symmetric 5 x 5 power. Skin: No rashes, lesions or ulcers Psychiatry: Judgement and insight appear normal. Mood & affect appropriate.   Data Reviewed: I have personally reviewed following labs and imaging studies  CBC:  Recent Labs Lab 08/19/16 1546 08/20/16 0432 08/22/16 1012  WBC 6.9 6.0 3.5*  HGB 11.6* 10.0* 9.6*  HCT 34.6* 30.4* 29.8*  MCV 76.5* 75.8* 78.2  PLT 179 158 209   Basic Metabolic Panel:  Recent Labs Lab 08/19/16 1546 08/20/16 0432 08/22/16 0528 08/22/16 1012  NA 133* 131*  --  138  K 3.8 3.6  --  3.8  CL 98* 99*  --  105  CO2 22 24  --  26  GLUCOSE 276* 406*  --  285*  BUN 10 7  --  6  CREATININE 1.07 1.01 1.17 1.23  CALCIUM 8.5* 7.8*  --  8.1*   GFR: Estimated Creatinine Clearance: 55.3 mL/min (by C-G formula based on SCr of 1.23 mg/dL). Liver Function Tests:  Recent Labs Lab 08/19/16 2013 08/22/16 1012  AST 23 32  ALT 11* 13*  ALKPHOS 64 45  BILITOT 0.6 0.5  PROT 6.3* 5.1*  ALBUMIN 2.2* 1.7*   No results for input(s): LIPASE, AMYLASE in the last 168 hours. No results for input(s): AMMONIA in the last 168 hours. Coagulation Profile:  Recent Labs Lab 08/19/16 2013  INR 1.22   Cardiac Enzymes: No results for input(s): CKTOTAL, CKMB, CKMBINDEX, TROPONINI in the last 168 hours. BNP (last 3 results) No results for input(s): PROBNP in the last 8760 hours. HbA1C:  Recent Labs  08/22/16 1012  HGBA1C 10.9*   CBG:  Recent Labs Lab 08/24/16 0741 08/24/16 1211 08/24/16 1705 08/24/16 2144 08/25/16 0825  GLUCAP 314* 92 231* 152* 158*   Lipid Profile: No results for input(s): CHOL, HDL, LDLCALC, TRIG, CHOLHDL, LDLDIRECT in the last 72 hours. Thyroid Function Tests: No results for input(s): TSH, T4TOTAL, FREET4, T3FREE, THYROIDAB in the last 72 hours. Anemia Panel: No results for input(s): VITAMINB12, FOLATE, FERRITIN, TIBC, IRON, RETICCTPCT in the last 72  hours. Urine analysis:    Component Value Date/Time   COLORURINE YELLOW 01/03/2016 2330   APPEARANCEUR CLEAR 01/03/2016 2330   LABSPEC 1.026 01/03/2016 2330   PHURINE 5.5 01/03/2016 2330   GLUCOSEU >1000 (A) 01/03/2016 2330   HGBUR NEGATIVE 01/03/2016 2330   BILIRUBINUR NEGATIVE 01/03/2016 2330   BILIRUBINUR neg 09/26/2015 1531   KETONESUR NEGATIVE 01/03/2016 2330   PROTEINUR NEGATIVE 01/03/2016 2330   UROBILINOGEN 0.2 09/26/2015 1531   UROBILINOGEN 0.2 12/05/2013 0721   NITRITE NEGATIVE 01/03/2016 2330   LEUKOCYTESUR NEGATIVE 01/03/2016 2330   Sepsis Labs: @LABRCNTIP (procalcitonin:4,lacticidven:4)  ) Recent Results (from the past 240 hour(s))  Blood culture (routine x 2)     Status: None (Preliminary result)   Collection Time: 08/19/16  4:50 PM  Result Value Ref Range Status   Specimen Description BLOOD RIGHT FOREARM  Final   Special Requests IN PEDIATRIC BOTTLE 3CC  Final   Culture  Setup Time   Final  IN PEDIATRIC BOTTLE GRAM NEGATIVE RODS GRAM POSITIVE RODS REVIEWED BY [employer] CRITICAL RESULT CALLED TO, READ BACK BY AND VERIFIED WITH: Melvyn Neth, PHARMD @0704  08/22/16 MKELLY,MLT    Culture CULTURE REINCUBATED FOR BETTER GROWTH  Final   Report Status PENDING  Incomplete  Blood Culture ID Panel (Reflexed)     Status: None   Collection Time: 08/19/16  4:50 PM  Result Value Ref Range Status   Enterococcus species NOT DETECTED NOT DETECTED Final   Listeria monocytogenes NOT DETECTED NOT DETECTED Final   Staphylococcus species NOT DETECTED NOT DETECTED Final   Staphylococcus aureus NOT DETECTED NOT DETECTED Final   Streptococcus species NOT DETECTED NOT DETECTED Final   Streptococcus agalactiae NOT DETECTED NOT DETECTED Final   Streptococcus pneumoniae NOT DETECTED NOT DETECTED Final   Streptococcus pyogenes NOT DETECTED NOT DETECTED Final   Acinetobacter baumannii NOT DETECTED NOT DETECTED Final   Enterobacteriaceae species NOT DETECTED NOT DETECTED Final   Enterobacter  cloacae complex NOT DETECTED NOT DETECTED Final   Escherichia coli NOT DETECTED NOT DETECTED Final   Klebsiella oxytoca NOT DETECTED NOT DETECTED Final   Klebsiella pneumoniae NOT DETECTED NOT DETECTED Final   Proteus species NOT DETECTED NOT DETECTED Final   Serratia marcescens NOT DETECTED NOT DETECTED Final   Haemophilus influenzae NOT DETECTED NOT DETECTED Final   Neisseria meningitidis NOT DETECTED NOT DETECTED Final   Pseudomonas aeruginosa NOT DETECTED NOT DETECTED Final   Candida albicans NOT DETECTED NOT DETECTED Final   Candida glabrata NOT DETECTED NOT DETECTED Final   Candida krusei NOT DETECTED NOT DETECTED Final   Candida parapsilosis NOT DETECTED NOT DETECTED Final   Candida tropicalis NOT DETECTED NOT DETECTED Final  Blood culture (routine x 2)     Status: None   Collection Time: 08/19/16  7:30 PM  Result Value Ref Range Status   Specimen Description BLOOD RIGHT HAND  Final   Special Requests IN PEDIATRIC BOTTLE 3CC  Final   Culture NO GROWTH 5 DAYS  Final   Report Status 08/24/2016 FINAL  Final  Aerobic/Anaerobic Culture (surgical/deep wound)     Status: None (Preliminary result)   Collection Time: 08/19/16 10:18 PM  Result Value Ref Range Status   Specimen Description ABSCESS RIGHT BUTTOCKS  Final   Special Requests PATIENT ON FOLLOWING VANC AND ZOSYN  Final   Gram Stain   Final    ABUNDANT WBC PRESENT,BOTH PMN AND MONONUCLEAR ABUNDANT GRAM POSITIVE RODS FEW GRAM POSITIVE COCCI IN PAIRS    Culture   Final    FEW KLEBSIELLA PNEUMONIAE HOLDING FOR POSSIBLE ANAEROBE    Report Status PENDING  Incomplete   Organism ID, Bacteria KLEBSIELLA PNEUMONIAE  Final      Susceptibility   Klebsiella pneumoniae - MIC*    AMPICILLIN RESISTANT Resistant     CEFAZOLIN <=4 SENSITIVE Sensitive     CEFEPIME <=1 SENSITIVE Sensitive     CEFTAZIDIME <=1 SENSITIVE Sensitive     CEFTRIAXONE <=1 SENSITIVE Sensitive     CIPROFLOXACIN <=0.25 SENSITIVE Sensitive     GENTAMICIN <=1  SENSITIVE Sensitive     IMIPENEM <=0.25 SENSITIVE Sensitive     TRIMETH/SULFA <=20 SENSITIVE Sensitive     AMPICILLIN/SULBACTAM <=2 SENSITIVE Sensitive     PIP/TAZO <=4 SENSITIVE Sensitive     Extended ESBL NEGATIVE Sensitive     * FEW KLEBSIELLA PNEUMONIAE     Invalid input(s): PROCALCITONIN, LACTICACIDVEN   Radiology Studies: No results found.      Scheduled Meds: . amoxicillin-clavulanate  1  tablet Oral Q12H  . atorvastatin  40 mg Oral Daily  . enoxaparin (LOVENOX) injection  40 mg Subcutaneous Q24H  . feeding supplement (GLUCERNA SHAKE)  237 mL Oral TID BM  . gabapentin  300 mg Oral QHS  . insulin aspart  0-9 Units Subcutaneous TID WC  . insulin aspart  5 Units Subcutaneous TID WC  . insulin glargine  30 Units Subcutaneous QHS  . lipase/protease/amylase  24,000 Units Oral TID AC  . mirtazapine  15 mg Oral QHS  . multivitamin with minerals  1 tablet Oral Q2200   Continuous Infusions:   LOS: 6 days    Time spent: 35 minutes    Frenchie Dangerfield A, MD [facility] Pager [phone]  If 7PM-7AM, please contact night-coverage [email or web address] Password [email or web address] 08/25/2016, 10:10 AM

## 2016-08-25 NOTE — Care Management Note (Signed)
Case Management Note  Patient Details  Name: Larinda ButteryGary Rodocker MRN: [id number] Date of Birth: [birth year]  Subjective/Objective:                    Action/Plan:   Expected Discharge Date:  08/25/16               Expected Discharge Plan:  Home w Home Health Services  In-House Referral:     Discharge planning Services  CM Consult  Post Acute Care Choice:  Home Health Choice offered to:  Patient  DME Arranged:  Vac, 3-N-1, Walker rolling DME Agency:  [facility], [facility]  [facility]  Status of Service:  Completed, signed off  If discussed at Long Length of Stay Meetings, dates discussed:    Additional Comments:  Kingsley PlanWile, Jaileen Janelle Marie, RN 08/25/2016, 10:14 AM

## 2016-08-25 NOTE — Progress Notes (Signed)
Pt discharged to home. Discharge instructions explained to pt.  Pt has no questions at the time of discharge.  Pt connected to portable wound vac and supplies to home with pt. IV removed. Pt was ordered antibiotic, but prescription could not be found.  Notified MD on call and he was busy at the moment and requested to be called back.  Will call back to have antibiotic ordered.  Pt taken off unit via wheelchair by staff.

## 2016-08-25 NOTE — Discharge Summary (Signed)
[facility] Discharge Summary   Patient ID: Raymond ButteryGary Winters MRN: [id number] DOB/AGE: [birth year] 58 y.o.  Admit date: 08/19/2016 Discharge date: 08/25/2016  Admitting Diagnosis: Necrotizing fasciitis  DM type II ETOH abuse Heroin use  Discharge Diagnosis Patient Active Problem List   Diagnosis Date Noted  . Necrotizing fasciitis (HCC) 08/19/2016  . Hypoglycemia 06/22/2016  . Depression 02/03/2016  . Chronic hepatitis C (HCC)   . Alcoholic liver disease, unspecified 01/05/2016  . Alcohol dependence (HCC) 01/05/2016  . Chronic alcoholic pancreatitis (HCC) 01/05/2016  . Hypoalbuminemia due to protein-calorie malnutrition (HCC) 01/05/2016  . Lactic acid increased, CHRONIC 01/05/2016  . Diarrhea 01/04/2016  . Chronic diarrhea 01/04/2016  . Pressure ulcer 01/04/2016  . Uncontrolled type 2 diabetes mellitus with complication, with long-term current use of insulin (HCC)   . Swelling   . Type 2 diabetes mellitus with complication, with long-term current use of insulin (HCC)   . Septic joint of left shoulder region (HCC)   . Abscess 12/24/2015  . Abscess of left shoulder   . Leukocytosis   . Type 2 diabetes mellitus with complication, without long-term current use of insulin (HCC)   . Glenohumeral arthritis 07/24/2015  . Left shoulder pain 07/24/2015  . 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 Tobacco abuse 07/26/2014  . Protein-calorie malnutrition, severe (HCC) 12/05/2013  . Hyperkalemia 12/04/2013  . Heroin withdrawal (HCC) 12/04/2013  . Alcohol abuse 12/04/2013  . Hyperglycemia 12/04/2013  . Metabolic acidosis with respiratory alkalosis 12/04/2013  . Heroin abuse 04/04/2013  . DM2 (diabetes mellitus, type 2) (HCC) 03/20/2013    Consultants Dr. Dimple NanasPhillips Hobbs, Internal Medicine  Imaging: No results found.  Procedures Dr. Corliss Skainssuei (08/19/16) - Incision and debridement of right buttock necrotizing fasciitis - skin, subcutaneous tissue, fascia, muscle - total wound area 20 x  10 cm x 3 cm deep   Hospital Course:  Raymond Winters is a 56 year old male with a history of substance abuse, chronic pancreatitis, hepatitis C, who [facility] pain after a fall that occurred one week prior to arrival.  Workup showed large soft tissue infection right hip/buttock - likely secondarily infected hematoma.  Patient was admitted and underwent procedure listed above.  Tolerated procedure well and was transferred to the floor. Internal medicine was consulted to help manage pt's chronic medical issues including his DM.  Diet was advanced as tolerated.  On POD#2 a wound vac was placed. On POD#6, the patient was voiding well, tolerating diet, ambulating well, pain well controlled, vital signs stable, wound vac in place and felt stable for discharge home. Pt was discharge with a week of Augmentin.  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 Pt was instructed to f/u with orthopedics regarding this finding and contact information provided in his discharge paperwork. Patient will follow up in our office in 1-2 weeks and knows to call with questions or concerns.  He will call to confirm appointment date/time.    Patient was discharged in good condition.  The [place] Substance controlled database was reviewed prior to prescribing narcotic pain medication to this patient.  Physical Exam: General:  Alert, NAD, pleasant, cooperative, well appearing Cardio: RRR, S1 & S2 normal, no murmur, rubs, gallops Resp: Effort normal, lungs CTA bilaterally, no wheezes, rales, rhonchi Abd:  Soft, ND, no tenderness, +BS Skin: wound vac in place on right lateral thigh/buttock. Serosanguinous drainage noted in canister.  Allergies as of 08/25/2016      Reactions   Ativan [lorazepam]  Other (See Comments)   HALLUCINATIONS      Medication List    STOP taking these medications   cephALEXin 500 MG capsule Commonly known as:  KEFLEX   ibuprofen 800 MG tablet Commonly known as:   ADVIL,MOTRIN     TAKE these medications   amoxicillin-clavulanate 875-125 MG tablet Commonly known as:  AUGMENTIN Take 1 tablet by mouth every 12 (twelve) hours.   aspirin EC 81 MG tablet Take 1 tablet (81 mg total) by mouth daily.   atorvastatin 40 MG tablet Commonly known as:  LIPITOR Take 1 tablet (40 mg total) by mouth daily.   gabapentin 300 MG capsule Commonly known as:  NEURONTIN TAKE 1 CAPSULE BY MOUTH AT BEDTIME FOR DIABETIC NERVE PAIN   glucose blood test strip Commonly known as:  TRUE METRIX BLOOD GLUCOSE TEST Use 3 times daily before meals   insulin aspart 100 UNIT/ML FlexPen Commonly known as:  NOVOLOG FLEXPEN 0-12 units before meals sliding scale as directed   Insulin Glargine 100 UNIT/ML Solostar Pen Commonly known as:  LANTUS SOLOSTAR Inject 25 Units into the skin daily at 10 pm.   meloxicam 15 MG tablet Commonly known as:  MOBIC Take 15 mg by mouth daily as needed for pain.   mirtazapine 15 MG tablet Commonly known as:  REMERON Take 1 tablet (15 mg total) by mouth at bedtime.   multivitamin with minerals tablet Take 1 tablet by mouth daily.   oxyCODONE 5 MG immediate release tablet Commonly known as:  Oxy IR/ROXICODONE Take 1 tablet (5 mg total) by mouth every 6 (six) hours as needed for moderate pain (before dressing changes).   Pancrelipase (Lip-Prot-Amyl) 40000-136000 units Cpep Commonly known as:  ZENPEP 40,000 by mouth 3 times daily What changed:  how much to take  how to take this  when to take this  additional instructions   pravastatin 20 MG tablet Commonly known as:  PRAVACHOL Take 20 mg by mouth daily.   TRUE METRIX METER Devi 1 each by Does not apply route 3 (three) times daily before meals.   TRUEPLUS LANCETS 28G Misc USE TO CHECK SUGARS 3 TIMES PER DAY        Follow-up Information    Wynona Luna., MD. Nyra Capes on 09/07/2016.   Specialty:  General Surgery Why:  appointment is at 9:50AM please arrive at 9:20 to  complete paperwork Contact information: [address] [place] [address] [phone]        Shelda Pal, MD. Schedule an appointment as soon as possible for a visit in 2 week(s).   Specialty:  Orthopedic Surgery Why:  regarding your bilateraly hip AVN Contact information: [address] [place] [address] [phone]           Signed: Joyce Copa [facility] 08/25/2016, 8:53 AM Pager: [phone] Consults: [phone] Mon-Fri 7:00 am-4:30 pm Sat-Sun 7:00 am-11:30 am

## 2016-08-26 ENCOUNTER — Telehealth: Payer: Self-pay | Admitting: *Deleted

## 2016-08-26 ENCOUNTER — Encounter: Payer: Self-pay | Admitting: Family Medicine

## 2016-08-26 ENCOUNTER — Ambulatory Visit: Payer: Medicare Other | Attending: Family Medicine | Admitting: Family Medicine

## 2016-08-26 VITALS — BP 96/65 | HR 92 | Temp 98.6°F | Ht 67.0 in | Wt 122.8 lb

## 2016-08-26 DIAGNOSIS — E1165 Type 2 diabetes mellitus with hyperglycemia: Secondary | ICD-10-CM | POA: Insufficient documentation

## 2016-08-26 DIAGNOSIS — E1140 Type 2 diabetes mellitus with diabetic neuropathy, unspecified: Secondary | ICD-10-CM | POA: Diagnosis not present

## 2016-08-26 DIAGNOSIS — J150 Pneumonia due to Klebsiella pneumoniae: Secondary | ICD-10-CM | POA: Insufficient documentation

## 2016-08-26 DIAGNOSIS — Z7982 Long term (current) use of aspirin: Secondary | ICD-10-CM | POA: Diagnosis not present

## 2016-08-26 DIAGNOSIS — Z794 Long term (current) use of insulin: Secondary | ICD-10-CM | POA: Diagnosis not present

## 2016-08-26 DIAGNOSIS — Z79899 Other long term (current) drug therapy: Secondary | ICD-10-CM | POA: Insufficient documentation

## 2016-08-26 DIAGNOSIS — Z1611 Resistance to penicillins: Secondary | ICD-10-CM | POA: Diagnosis not present

## 2016-08-26 DIAGNOSIS — L02212 Cutaneous abscess of back [any part, except buttock]: Secondary | ICD-10-CM | POA: Diagnosis not present

## 2016-08-26 DIAGNOSIS — M726 Necrotizing fasciitis: Secondary | ICD-10-CM | POA: Insufficient documentation

## 2016-08-26 DIAGNOSIS — E118 Type 2 diabetes mellitus with unspecified complications: Secondary | ICD-10-CM

## 2016-08-26 DIAGNOSIS — K860 Alcohol-induced chronic pancreatitis: Secondary | ICD-10-CM | POA: Insufficient documentation

## 2016-08-26 DIAGNOSIS — E162 Hypoglycemia, unspecified: Secondary | ICD-10-CM

## 2016-08-26 LAB — CULTURE, BLOOD (ROUTINE X 2)

## 2016-08-26 LAB — AEROBIC/ANAEROBIC CULTURE (SURGICAL/DEEP WOUND)

## 2016-08-26 LAB — GLUCOSE, POCT (MANUAL RESULT ENTRY): POC GLUCOSE: 237 mg/dL — AB (ref 70–99)

## 2016-08-26 LAB — AEROBIC/ANAEROBIC CULTURE W GRAM STAIN (SURGICAL/DEEP WOUND)

## 2016-08-26 MED ORDER — INSULIN GLARGINE 100 UNIT/ML SOLOSTAR PEN: 15.0000 [IU] | PEN_INJECTOR | Freq: Every day | SUBCUTANEOUS | 3 refills | Status: DC

## 2016-08-26 MED ORDER — MELOXICAM 7.5 MG PO TABS: 7.5000 mg | ORAL_TABLET | Freq: Every day | ORAL | 1 refills | Status: DC | PRN

## 2016-08-26 MED ORDER — CIPROFLOXACIN HCL 500 MG PO TABS: 500.0000 mg | ORAL_TABLET | Freq: Two times a day (BID) | ORAL | 0 refills | Status: DC

## 2016-08-26 MED ORDER — ACCU-CHEK SOFTCLIX LANCET DEV MISC: 5 refills | Status: DC

## 2016-08-26 MED ORDER — GLUCOSE BLOOD VI STRP: ORAL_STRIP | 12 refills | Status: DC

## 2016-08-26 MED ORDER — ACCU-CHEK AVIVA DEVI: 0 refills | Status: DC

## 2016-08-26 MED ORDER — MELOXICAM 7.5 MG PO TABS
7.5000 mg | ORAL_TABLET | Freq: Every day | ORAL | 1 refills | Status: DC | PRN
Start: 2016-08-26 — End: 2016-08-26

## 2016-08-26 MED ORDER — INSULIN ASPART 100 UNIT/ML FLEXPEN: PEN_INJECTOR | SUBCUTANEOUS | 3 refills | Status: DC

## 2016-08-26 MED ORDER — TRAMADOL HCL 50 MG PO TABS: 50.0000 mg | ORAL_TABLET | Freq: Four times a day (QID) | ORAL | 0 refills | Status: DC | PRN

## 2016-08-26 NOTE — Progress Notes (Signed)
Subjective:  Patient ID: Raymond Winters, male    DOB: May 23, 1961  Age: 57 y.o. MRN: [id number]  CC: Diabetes (BS 26 this morning) and Fall (2 weeks ago- right hip infected)   HPI Raymond Winters is a 56 year old male with history of type 2 diabetes mellitus (A1c 10.9 up from 7.8 previously), diabetic neuropathy, chronic alcoholic pancreatitis, substance abuse who presents today  With hypoglycemia of 26 after being discharged yesterday from hospitalization where he was managed for necrotizing fasciitis of the right buttock at [facility] from 2/26-08/25/16.  EMS was called and he received a glucose drink with improvement of blood sugar, he refused to go to the ER; blood sugar in the clinic is 237. He did not have much to eat last night as per his sister. He denies light headedness, tremors, sweating at this time.  He presented to the ED with right hip pain after a fall and workup revealed infected hematoma; he underwent debridement of right buttock abscess and subsequent placement of wound vac.    Past Medical History:  Diagnosis Date  . Alcohol dependence (HCC) 01/05/2016  . Alcoholic liver disease, unspecified 01/05/2016  . Arthritis    "lower extremeties, hands, shoulders, back" (08/20/2016)  . Chronic alcoholic pancreatitis (HCC) 01/05/2016  . Depression   . Family history of adverse reaction to anesthesia    pt's sister has hx. of post-op N/V  . Hepatitis C    "never treated" (08/20/2016)  . Heroin abuse   . High cholesterol   . Hypoalbuminemia due to protein-calorie malnutrition (HCC) 01/05/2016  . Insulin dependent diabetes mellitus (HCC)   . Lactic acid increased, CHRONIC 01/05/2016   Secondary to Liver Disease, Alcoholism, and Diabetes  . 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 Shoulder wound 01/2016   left    Past Surgical History:  Procedure Laterality Date  . APPLICATION OF WOUND VAC Left 12/24/2015   Procedure: APPLICATION OF WOUND VAC; LEFT SHOULDER;   Surgeon: Tarry Kos, MD;  Location: [facility] OR;  Service: Orthopedics;  Laterality: Left;  . APPLICATION OF WOUND VAC Left 01/29/2016   Procedure: LEFT SHOULDER SPLIT THICKNESS SKIN GRAFT, WOUND VAC;  Surgeon: Tarry Kos, MD;  Location: [facility];  Service: Orthopedics;  Laterality: Left;  . I&D EXTREMITY Right 03/20/2013   Procedure: IRRIGATION AND DEBRIDEMENT EXTREMITY;  Surgeon: Marlowe Shores, MD;  Location: [facility] OR;  Service: Orthopedics;  Laterality: Right;  . I&D EXTREMITY Left 12/24/2015   Procedure:  DEBRIDEMENT OF MUSCLE, SUBCUTANEOUS TISSUE LEFT SHOULDER;  Surgeon: Tarry Kos, MD;  Location: [facility] OR;  Service: Orthopedics;  Laterality: Left;  . INCISION AND DRAINAGE ABSCESS Right 08/19/2016   Procedure: DEBRIDEMENT RIGHT BUTTOCK  ABSCESS;  Surgeon: Manus Rudd, MD;  Location[facility] OR;  Service: General;  Laterality: Right;  . IRRIGATION AND DEBRIDEMENT ABSCESS Right 08/19/2016   buttocks  . IRRIGATION AND DEBRIDEMENT SHOULDER Left 12/27/2015   Procedure: IRRIGATION AND DEBRIDEMENT LEFT SHOULDER;  Surgeon: Tarry Kos, MD;  Location: [facility];  Service: Orthopedics;  Laterality: Left;  . IRRIGATION AND DEBRIDEMENT SHOULDER Left 12/30/2015   Procedure: IRRIGATION AND DEBRIDEMENT LEFT SHOULDER;  Surgeon: Tarry Kos, MD;  Location: [facility];  Service: Orthopedics;  Laterality: Left;  . SKIN SPLIT GRAFT Left 01/29/2016   Procedure: SKIN GRAFT SPLIT THICKNESS,;  Surgeon: Tarry Kos, MD;  Location: [facility];  Service: Orthopedics;  Laterality: Left;    Allergies  Allergen  Reactions  . Ativan [Lorazepam] Other (See Comments)    HALLUCINATIONS     Outpatient Medications Prior to Visit  Medication Sig Dispense Refill  . amoxicillin-clavulanate (AUGMENTIN) 875-125 MG tablet Take 1 tablet by mouth every 12 (twelve) hours. 14 tablet 0  . aspirin EC 81 MG tablet Take 1 tablet (81 mg total) by mouth daily. 30 tablet 5  . atorvastatin (LIPITOR) 40 MG tablet Take 1  tablet (40 mg total) by mouth daily. 30 tablet 5  . gabapentin (NEURONTIN) 300 MG capsule TAKE 1 CAPSULE BY MOUTH AT BEDTIME FOR DIABETIC NERVE PAIN 30 capsule 5  . mirtazapine (REMERON) 15 MG tablet Take 1 tablet (15 mg total) by mouth at bedtime. 30 tablet 2  . oxyCODONE (OXY IR/ROXICODONE) 5 MG immediate release tablet Take 1 tablet (5 mg total) by mouth every 6 (six) hours as needed for moderate pain (before dressing changes). 15 tablet 0  . Pancrelipase, Lip-Prot-Amyl, (ZENPEP) 40000-136000 units CPEP 40,000 by mouth 3 times daily (Patient taking differently: Take 40,000 Units by mouth 3 (three) times daily. 40,000 by mouth 3 times daily) 90 capsule 3  . pravastatin (PRAVACHOL) 20 MG tablet Take 20 mg by mouth daily.  3  . Blood Glucose Monitoring Suppl (TRUE METRIX METER) DEVI 1 each by Does not apply route 3 (three) times daily before meals. 1 Device 0  . glucose blood (TRUE METRIX BLOOD GLUCOSE TEST) test strip Use 3 times daily before meals 100 each 12  . insulin aspart (NOVOLOG FLEXPEN) 100 UNIT/ML FlexPen Inject 6 Units into the skin 3 (three) times daily with meals. 0-12 units before meals sliding scale as directed 15 mL 3  . Insulin Glargine (LANTUS SOLOSTAR) 100 UNIT/ML Solostar Pen Inject 30 Units into the skin daily at 10 pm. 15 mL 3  . TRUEPLUS LANCETS 28G MISC USE TO CHECK SUGARS 3 TIMES PER DAY 100 each 5  . Multiple Vitamins-Minerals (MULTIVITAMIN WITH MINERALS) tablet Take 1 tablet by mouth daily.    . meloxicam (MOBIC) 15 MG tablet Take 15 mg by mouth daily as needed for pain.      No facility-administered medications prior to visit.     ROS Review of Systems  Constitutional: Negative for activity change and appetite change.  HENT: Negative for sinus pressure and sore throat.   Eyes: Negative for visual disturbance.  Respiratory: Negative for cough, chest tightness and shortness of breath.   Cardiovascular: Negative for chest pain and leg swelling.  Gastrointestinal:  Negative for abdominal distention, abdominal pain, constipation and diarrhea.  Endocrine: Negative.   Genitourinary: Negative for dysuria.  Musculoskeletal: Negative for joint swelling and myalgias.  Skin: Positive for wound.  Allergic/Immunologic: Negative.   Neurological: Negative for weakness, light-headedness and numbness.  Psychiatric/Behavioral: Negative for dysphoric mood and suicidal ideas.     Objective:  BP 96/65 (BP Location: Right Arm, Patient Position: Sitting, Cuff Size: Small)   Pulse 92   Temp 98.6 F (37 C) (Oral)   Ht 5\' 7"  (1.702 m)   Wt 122 lb 12.8 oz (55.7 kg)   SpO2 99%   BMI 19.23 kg/m   BP/Weight 08/26/2016 08/25/2016 08/19/2016  Systolic BP 96 102 -  Diastolic BP 65 62 -  Wt. (Lbs) 122.8 - 130  BMI 19.23 - 20.36      Physical Exam Constitutional: He is oriented to person, place, and time. He appears well-developed and well-nourished.  Cardiovascular: Normal rate, normal heart sounds and intact distal pulses.   No murmur heard.  Pulmonary/Chest: Effort normal and breath sounds normal. He has no wheezes. He has no rales. He exhibits no tenderness.  Abdominal: Soft. Bowel sounds are normal. He exhibits no distension and no mass. There is no tenderness.  Musculoskeletal: Normal range of motion.  Neurological: He is alert and oriented to person, place, and time.  Skin: Wound VAC in place in right gluteal region, actively draining  Lab Results  Component Value Date   HGBA1C 10.9 (H) 08/22/2016    Assessment & Plan:   1. Type 2 diabetes mellitus with complication, with long-term current use of insulin (HCC) Uncontrolled with A1c of 10.9 We'll reduce Lantus dose to 15 units due to hypoglycemia Advised to adjust Lantus dose by 2 units depending on blood sugars-hypo or hyperglycemia Will review blood sugar log at next visit - Glucose (CBG) - Insulin Glargine (LANTUS SOLOSTAR) 100 UNIT/ML Solostar Pen; Inject 15 Units into the skin daily at 10 pm.   Dispense: 15 mL; Refill: 3 - insulin aspart (NOVOLOG FLEXPEN) 100 UNIT/ML FlexPen; 0-12 units before meals as per sliding scale as directed  Dispense: 15 mL; Refill: 3 - glucose blood (ACCU-CHEK AVIVA) test strip; Use as instructed 3 times before meals  Dispense: 100 each; Refill: 12 - Blood Glucose Monitoring Suppl (ACCU-CHEK AVIVA) device; Use as instructed 3 times before meals  Dispense: 1 each; Refill: 0 - Lancet Devices (ACCU-CHEK SOFTCLIX) lancets; Use as instructed 3 times before meals  Dispense: 1 each; Refill: 5  2. Hypoglycemia Likely due to poor oral intake We'll reduce Lantus dose at this time  3. Necrotizing fasciitis (HCC) Abscess came back positive for Klebsiella pneumonia resistant to ampicillin and sensitive to Cipro Switched from Augmentin to Cipro Keep appointment with Gen. surgery. - traMADol (ULTRAM) 50 MG tablet; Take 1 tablet (50 mg total) by mouth every 6 (six) hours as needed.  Dispense: 60 tablet; Refill: 0 - meloxicam (MOBIC) 7.5 MG tablet; Take 1 tablet (7.5 mg total) by mouth daily as needed for pain.  Dispense: 30 tablet; Refill: 1   Meds ordered this encounter  Medications  . traMADol (ULTRAM) 50 MG tablet    Sig: Take 1 tablet (50 mg total) by mouth every 6 (six) hours as needed.    Dispense:  60 tablet    Refill:  0  . Insulin Glargine (LANTUS SOLOSTAR) 100 UNIT/ML Solostar Pen    Sig: Inject 15 Units into the skin daily at 10 pm.    Dispense:  15 mL    Refill:  3    Discontinue previous dose  . DISCONTD: insulin aspart (NOVOLOG FLEXPEN) 100 UNIT/ML FlexPen    Sig: 0-12 units before meals as per sliding scale as directed    Dispense:  15 mL    Refill:  3    0-150 mg/dl give 0 units, 981- 191Y give 2 units, 201-250 give 4 units, 251-300 give 6 units, 301-350 give 8 units, 351-400 give 10 units, greater than 400 give 12 units  . insulin aspart (NOVOLOG FLEXPEN) 100 UNIT/ML FlexPen    Sig: 0-12 units before meals as per sliding scale as directed     Dispense:  15 mL    Refill:  3    0-150 mg/dl give 0 units, 782- 956O give 2 units, 201-250 give 4 units, 251-300 give 6 units, 301-350 give 8 units, 351-400 give 10 units, greater than 400 give 12 units  . glucose blood (ACCU-CHEK AVIVA) test strip    Sig: Use as instructed 3 times before meals  Dispense:  100 each    Refill:  12  . Blood Glucose Monitoring Suppl (ACCU-CHEK AVIVA) device    Sig: Use as instructed 3 times before meals    Dispense:  1 each    Refill:  0  . Lancet Devices (ACCU-CHEK SOFTCLIX) lancets    Sig: Use as instructed 3 times before meals    Dispense:  1 each    Refill:  5  . meloxicam (MOBIC) 7.5 MG tablet    Sig: Take 1 tablet (7.5 mg total) by mouth daily as needed for pain.    Dispense:  30 tablet    Refill:  1    Follow-up: Return in about 1 week (around 09/02/2016) for Follow-up: Hypoglycemia.   Jaclyn Shaggy MD

## 2016-08-26 NOTE — Patient Instructions (Signed)
Hypoglycemia Hypoglycemia occurs when the level of sugar (glucose) in the blood is too low. Glucose is a type of sugar that provides the body's main source of energy. Certain hormones (insulin and glucagon) control the level of glucose in the blood. Insulin lowers blood glucose, and glucagon increases blood glucose. Hypoglycemia can result from having too much insulin in the bloodstream, or from not eating enough food that contains glucose. Hypoglycemia can happen in people who do or do not have diabetes. It can develop quickly, and it can be a medical emergency. What are the causes? Hypoglycemia occurs most often in people who have diabetes. If you have diabetes, hypoglycemia may be caused by:  Diabetes medicine.  Not eating enough, or not eating often enough.  Increased physical activity.  Drinking alcohol, especially when you have not eaten recently. If you do not have diabetes, hypoglycemia may be caused by:  A tumor in the pancreas. The pancreas is the organ that makes insulin.  Not eating enough, or not eating for long periods at a time (fasting).  Severe infection or illness that affects the liver, heart, or kidneys.  Certain medicines. You may also have reactive hypoglycemia. This condition causes hypoglycemia within 4 hours of eating a meal. This may occur after having stomach surgery. Sometimes, the cause of reactive hypoglycemia is not known. What increases the risk? Hypoglycemia is more likely to develop in:  People who have diabetes and take medicines to lower blood glucose.  People who abuse alcohol.  People who have a severe illness. What are the signs or symptoms? Hypoglycemia may not cause any symptoms. If you have symptoms, they may include:  Hunger.  Anxiety.  Sweating and feeling clammy.  Confusion.  Dizziness or feeling light-headed.  Sleepiness.  Nausea.  Increased heart rate.  Headache.  Blurry vision.  Seizure.  Nightmares.  Tingling  or numbness around the mouth, lips, or tongue.  A change in speech.  Decreased ability to concentrate.  A change in coordination.  Restless sleep.  Tremors or shakes.  Fainting.  Irritability. How is this diagnosed? Hypoglycemia is diagnosed with a blood test to measure your blood glucose level. This blood test is done while you are having symptoms. Your health care provider may also do a physical exam and review your medical history. If you do not have diabetes, other tests may be done to find the cause of your hypoglycemia. How is this treated? This condition can often be treated by immediately eating or drinking something that contains glucose, such as:  3-4 sugar tablets (glucose pills).  Glucose gel, 15-gram tube.  Fruit juice, 4 oz (120 mL).  Regular soda (not diet soda), 4 oz (120 mL).  Low-fat milk, 4 oz (120 mL).  Several pieces of hard candy.  Sugar or honey, 1 Tbsp. Treating Hypoglycemia If You Have Diabetes  If you are alert and able to swallow safely, follow the 15:15 rule:  Take 15 grams of a rapid-acting carbohydrate. Rapid-acting options include:  1 tube of glucose gel.  3 glucose pills.  6-8 pieces of hard candy.  4 oz (120 mL) of fruit juice.  4 oz (120 ml) of regular (not diet) soda.  Check your blood glucose 15 minutes after you take the carbohydrate.  If the repeat blood glucose level is still at or below 70 mg/dL (3.9 mmol/L), take 15 grams of a carbohydrate again.  If your blood glucose level does not increase above 70 mg/dL (3.9 mmol/L) after 3 tries, seek emergency   emergency medical care.  After your blood glucose level returns to normal, eat a meal or a snack within 1 hour. Treating Severe Hypoglycemia  Severe hypoglycemia is when your blood glucose level is at or below 54 mg/dL (3 mmol/L). Severe hypoglycemia is an emergency. Do not wait to see if the symptoms will go away. Get medical help right away. Call your local emergency services (911  in the [place] Do not drive yourself to the hospital. If you have severe hypoglycemia and you cannot eat or drink, you may need an injection of glucagon. A family member or close friend should learn how to check your blood glucose and how to give you a glucagon injection. Ask your health care provider if you need to have an emergency glucagon injection kit available. Severe hypoglycemia may need to be treated in a hospital. The treatment may include getting glucose through an IV tube. You may also need treatment for the cause of your hypoglycemia. Follow these instructions at home: General instructions   Avoid any diets that cause you to not eat enough food. Talk with your health care provider before you start any new diet.  Take over-the-counter and prescription medicines only as told by your health care provider.  Limit alcohol intake to no more than 1 drink per day for nonpregnant women and 2 drinks per day for men. One drink equals 12 oz of beer, 5 oz of wine, or 1 oz of hard liquor.  Keep all follow-up visits as told by your health care provider. This is important. If You Have Diabetes:    Make sure you know the symptoms of hypoglycemia.  Always have a rapid-acting carbohydrate snack with you to treat low blood sugar.  Follow your diabetes management plan, as told by your health care provider. Make sure you:  Take your medicines as directed.  Follow your exercise plan.  Follow your meal plan. Eat on time, and do not skip meals.  Check your blood glucose as often as directed. Make sure to check your blood glucose before and after exercise. If you exercise longer or in a different way than usual, check your blood glucose more often.  Follow your sick day plan whenever you cannot eat or drink normally. Make this plan in advance with your health care provider.  Share your diabetes management plan with people in your workplace, school, and household.  Check your urine for ketones  when you are ill and as told by your health care provider.  Carry a medical alert card or wear medical alert jewelry. If You Have Reactive Hypoglycemia or Low Blood Sugar From Other Causes:   Monitor your blood glucose as told by your health care provider.  Follow instructions from your health care provider about eating or drinking restrictions. Contact a health care provider if:  You have problems keeping your blood glucose in your target range.  You have frequent episodes of hypoglycemia. Get help right away if:  You continue to have hypoglycemia symptoms after eating or drinking something containing glucose.  Your blood glucose is at or below 54 mg/dL (3 mmol/L).  You have a seizure.  You faint. These symptoms may represent a serious problem that is an emergency. Do not wait to see if the symptoms will go away. Get medical help right away. Call your local emergency services (911 in the [place] Do not drive yourself to the hospital. This information is not intended to replace advice given to you by your health care provider.  Make sure you discuss any questions you have with your health care provider. Document Released: 06/08/2005 Document Revised: 11/20/2015 Document Reviewed: 07/12/2015 Elsevier Interactive Patient Education  2017 [employer].

## 2016-08-26 NOTE — Telephone Encounter (Signed)
Pt released from hospital 08/26/16 after being admitted on 2/28. BS last night was 325 to 220 to 26 this morning. Paramedics were called and able to raise blood sugar to  85. Incident occurred at 0800. Pt ate piece of sausage bread and banana. He fell some time ago and a place has been infected pt has wound vac. Apt made for pt to see Dr.Amao today.

## 2016-08-26 NOTE — Progress Notes (Signed)
Needs refills on mobic

## 2016-09-02 ENCOUNTER — Encounter: Payer: Self-pay | Admitting: Family Medicine

## 2016-09-02 ENCOUNTER — Ambulatory Visit: Payer: Medicare Other | Attending: Family Medicine | Admitting: Family Medicine

## 2016-09-02 VITALS — BP 100/68 | HR 100 | Temp 98.0°F | Ht 67.0 in | Wt 123.0 lb

## 2016-09-02 DIAGNOSIS — M726 Necrotizing fasciitis: Secondary | ICD-10-CM

## 2016-09-02 DIAGNOSIS — Z794 Long term (current) use of insulin: Secondary | ICD-10-CM

## 2016-09-02 DIAGNOSIS — E118 Type 2 diabetes mellitus with unspecified complications: Secondary | ICD-10-CM | POA: Diagnosis not present

## 2016-09-02 DIAGNOSIS — E1140 Type 2 diabetes mellitus with diabetic neuropathy, unspecified: Secondary | ICD-10-CM | POA: Diagnosis not present

## 2016-09-02 DIAGNOSIS — E11649 Type 2 diabetes mellitus with hypoglycemia without coma: Secondary | ICD-10-CM | POA: Diagnosis not present

## 2016-09-02 DIAGNOSIS — K860 Alcohol-induced chronic pancreatitis: Secondary | ICD-10-CM | POA: Insufficient documentation

## 2016-09-02 LAB — GLUCOSE, POCT (MANUAL RESULT ENTRY): POC Glucose: 273 mg/dl — AB (ref 70–99)

## 2016-09-02 MED ORDER — INSULIN GLARGINE 100 UNIT/ML SOLOSTAR PEN: 25.0000 [IU] | PEN_INJECTOR | Freq: Every day | SUBCUTANEOUS | 3 refills | Status: DC

## 2016-09-02 MED ORDER — MIRTAZAPINE 30 MG PO TABS: 30.0000 mg | ORAL_TABLET | Freq: Every day | ORAL | 3 refills | Status: DC

## 2016-09-02 MED FILL — ACCU-CHEK AVIVA PLUS TEST S: 33 days supply | Qty: 100 | Fill #0

## 2016-09-02 MED FILL — ATORVASTATIN 40 MG TABLET: 40 | 30 days supply | Qty: 30 | Fill #1

## 2016-09-02 MED FILL — MIRTAZAPINE 15 MG TABLET: 15 | 30 days supply | Qty: 30 | Fill #2

## 2016-09-02 MED FILL — GABAPENTIN 300 MG CAPSULE: 300 | 30 days supply | Qty: 30 | Fill #0

## 2016-09-02 NOTE — Progress Notes (Signed)
Subjective:    Patient ID: Raymond Winters, male    DOB: Apr 10, 1961, 56 y.o.   MRN: [id number]  HPI Raymond Winters is a 56 year old male with history of type 2 diabetes mellitus (A1c 10.9 up from 7.8 previously), diabetic neuropathy, chronic alcoholic pancreatitis, substance abuse, necrotizing fasciitis of the right buttock status post debridement with wound VAC in place who presents today For follow-up of hypoglycemia of 26 prior to his last office visit. Lantus was decreased to 15 units at bedtime along with his NovoLog sliding scale.  Blood sugar log reveals fasting sugars in the mid 200s and random sugars in the mid 300s. He has had no more hypoglycemic episodes.   He is currently taking ciprofloxacin (switched from Augmentin to to sensitivity results) and he is scheduled to see his general surgeon next week. He is requesting an increase  in his dose of Remeron.  Past Medical History:  Diagnosis Date  . Alcohol dependence (HCC) 01/05/2016  . Alcoholic liver disease, unspecified 01/05/2016  . Arthritis    "lower extremeties, hands, shoulders, back" (08/20/2016)  . Chronic alcoholic pancreatitis (HCC) 01/05/2016  . Depression   . Family history of adverse reaction to anesthesia    pt's sister has hx. of post-op N/V  . Hepatitis C    "never treated" (08/20/2016)  . Heroin abuse   . High cholesterol   . Hypoalbuminemia due to protein-calorie malnutrition (HCC) 01/05/2016  . Insulin dependent diabetes mellitus (HCC)   . Lactic acid increased, CHRONIC 01/05/2016   Secondary to Liver Disease, Alcoholism, and Diabetes  . 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 Shoulder wound 01/2016   left    Past Surgical History:  Procedure Laterality Date  . APPLICATION OF WOUND VAC Left 12/24/2015   Procedure: APPLICATION OF WOUND VAC; LEFT SHOULDER;  Surgeon: Tarry KosNaiping M Xu, MD;  Location: [facility] OR;  Service: Orthopedics;  Laterality: Left;  . APPLICATION OF WOUND VAC Left 01/29/2016   Procedure: LEFT SHOULDER SPLIT THICKNESS SKIN GRAFT, WOUND VAC;  Surgeon: Tarry KosNaiping M Xu, MD;  Location: [facility];  Service: Orthopedics;  Laterality: Left;  . I&D EXTREMITY Right 03/20/2013   Procedure: IRRIGATION AND DEBRIDEMENT EXTREMITY;  Surgeon: Marlowe ShoresMatthew A Weingold, MD;  Location: [facility] OR;  Service: Orthopedics;  Laterality: Right;  . I&D EXTREMITY Left 12/24/2015   Procedure:  DEBRIDEMENT OF MUSCLE, SUBCUTANEOUS TISSUE LEFT SHOULDER;  Surgeon: Tarry KosNaiping M Xu, MD;  Location: [facility];  Service: Orthopedics;  Laterality: Left;  . INCISION AND DRAINAGE ABSCESS Right 08/19/2016   Procedure: DEBRIDEMENT RIGHT BUTTOCK  ABSCESS;  Surgeon: Manus RuddMatthew Tsuei, MD;  [facility];  Service: General;  Laterality: Right;  . IRRIGATION AND DEBRIDEMENT ABSCESS Right 08/19/2016   buttocks  . IRRIGATION AND DEBRIDEMENT SHOULDER Left 12/27/2015   Procedure: IRRIGATION AND DEBRIDEMENT LEFT SHOULDER;  Surgeon: Tarry KosNaiping M Xu, MD;  Location: [facility];  Service: Orthopedics;  Laterality: Left;  . IRRIGATION AND DEBRIDEMENT SHOULDER Left 12/30/2015   Procedure: IRRIGATION AND DEBRIDEMENT LEFT SHOULDER;  Surgeon: Tarry KosNaiping M Xu, MD;  Location: [facility];  Service: Orthopedics;  Laterality: Left;  . SKIN SPLIT GRAFT Left 01/29/2016   Procedure: SKIN GRAFT SPLIT THICKNESS,;  Surgeon: Tarry KosNaiping M Xu, MD;  Location: [facility];  Service: Orthopedics;  Laterality: Left;    Allergies  Allergen Reactions  . Ativan [Lorazepam] Other (See Comments)    HALLUCINATIONS     Review of Systems Constitutional: Negative for activity change and appetite  change.  HENT: Negative for sinus pressure and sore throat.   Eyes: Negative for visual disturbance.  Respiratory: Negative for cough, chest tightness and shortness of breath.   Cardiovascular: Negative for chest pain and leg swelling.  Gastrointestinal: Negative for abdominal distention, abdominal pain, constipation and diarrhea.  Endocrine: Negative.   Genitourinary:  Negative for dysuria.  Musculoskeletal: Negative for joint swelling and myalgias.  Skin: Positive for wound.  Allergic/Immunologic: Negative.   Neurological: Negative for weakness, light-headedness and numbness.  Psychiatric/Behavioral: Negative for dysphoric mood and suicidal ideas.      Objective: Vitals:   09/02/16 0947  BP: 100/68  Pulse: 100  Temp: 98 F (36.7 C)  TempSrc: Oral  SpO2: 99%  Weight: 123 lb (55.8 kg)  Height: 5\' 7"  (1.702 m)      Physical Exam Constitutional: He is oriented to person, place, and time. He appears well-developed and well-nourished.  Cardiovascular: Normal rate, normal heart sounds and intact distal pulses.   No murmur heard. Pulmonary/Chest: Effort normal and breath sounds normal. He has no wheezes. He has no rales. He exhibits no tenderness.  Abdominal: Soft. Bowel sounds are normal. He exhibits no distension and no mass. There is no tenderness.  Musculoskeletal: Normal range of motion.  Neurological: He is alert and oriented to person, place, and time.  Skin: Wound VAC in place in right gluteal region, actively draining      Assessment & Plan:  1. Type 2 diabetes mellitus with complication, with long-term current use of insulin (HCC) Uncontrolled with A1c of 10.9 Increase Lantus from 15 units to 25 units at bedtime Advised to adjust Lantus dose by 2 units depending on blood sugars-hypo or hyperglycemia Will review blood sugar log at next visit  2. Hypoglycemia Resolved Likely due to poor oral intake prior to last visit.   3. Necrotizing fasciitis (HCC) Continue ciprofloxacin Keep appointment with Gen. surgery.

## 2016-09-02 NOTE — Patient Instructions (Signed)
Diabetes Mellitus and Food It is important for you to manage your blood sugar (glucose) level. Your blood glucose level can be greatly affected by what you eat. Eating healthier foods in the appropriate amounts throughout the day at about the same time each day will help you control your blood glucose level. It can also help slow or prevent worsening of your diabetes mellitus. Healthy eating may even help you improve the level of your blood pressure and reach or maintain a healthy weight. General recommendations for healthful eating and cooking habits include:  Eating meals and snacks regularly. Avoid going long periods of time without eating to lose weight.  Eating a diet that consists mainly of plant-based foods, such as fruits, vegetables, nuts, legumes, and whole grains.  Using low-heat cooking methods, such as baking, instead of high-heat cooking methods, such as deep frying.  Work with your dietitian to make sure you understand how to use the Nutrition Facts information on food labels. How can food affect me? Carbohydrates Carbohydrates affect your blood glucose level more than any other type of food. Your dietitian will help you determine how many carbohydrates to eat at each meal and teach you how to count carbohydrates. Counting carbohydrates is important to keep your blood glucose at a healthy level, especially if you are using insulin or taking certain medicines for diabetes mellitus. Alcohol Alcohol can cause sudden decreases in blood glucose (hypoglycemia), especially if you use insulin or take certain medicines for diabetes mellitus. Hypoglycemia can be a life-threatening condition. Symptoms of hypoglycemia (sleepiness, dizziness, and disorientation) are similar to symptoms of having too much alcohol. If your health care provider has given you approval to drink alcohol, do so in moderation and use the following guidelines:  Women should not have more than one drink per day, and men  should not have more than two drinks per day. One drink is equal to: ? 12 oz of beer. ? 5 oz of wine. ? 1 oz of hard liquor.  Do not drink on an empty stomach.  Keep yourself hydrated. Have water, diet soda, or unsweetened iced tea.  Regular soda, juice, and other mixers might contain a lot of carbohydrates and should be counted.  What foods are not recommended? As you make food choices, it is important to remember that all foods are not the same. Some foods have fewer nutrients per serving than other foods, even though they might have the same number of calories or carbohydrates. It is difficult to get your body what it needs when you eat foods with fewer nutrients. Examples of foods that you should avoid that are high in calories and carbohydrates but low in nutrients include:  Trans fats (most processed foods list trans fats on the Nutrition Facts label).  Regular soda.  Juice.  Candy.  Sweets, such as cake, pie, doughnuts, and cookies.  Fried foods.  What foods can I eat? Eat nutrient-rich foods, which will nourish your body and keep you healthy. The food you should eat also will depend on several factors, including:  The calories you need.  The medicines you take.  Your weight.  Your blood glucose level.  Your blood pressure level.  Your cholesterol level.  You should eat a variety of foods, including:  Protein. ? Lean cuts of meat. ? Proteins low in saturated fats, such as fish, egg whites, and beans. Avoid processed meats.  Fruits and vegetables. ? Fruits and vegetables that may help control blood glucose levels, such as apples,   mangoes, and yams.  Dairy products. ? Choose fat-free or low-fat dairy products, such as milk, yogurt, and cheese.  Grains, bread, pasta, and rice. ? Choose whole grain products, such as multigrain bread, whole oats, and brown rice. These foods may help control blood pressure.  Fats. ? Foods containing healthful fats, such as  nuts, avocado, olive oil, canola oil, and fish.  Does everyone with diabetes mellitus have the same meal plan? Because every person with diabetes mellitus is different, there is not one meal plan that works for everyone. It is very important that you meet with a dietitian who will help you create a meal plan that is just right for you. This information is not intended to replace advice given to you by your health care provider. Make sure you discuss any questions you have with your health care provider. Document Released: 03/05/2005 Document Revised: 11/14/2015 Document Reviewed: 05/05/2013 Elsevier Interactive Patient Education  2017 [employer]  

## 2016-09-04 ENCOUNTER — Telehealth: Payer: Self-pay | Admitting: Family Medicine

## 2016-09-04 NOTE — Telephone Encounter (Signed)
Pt sister states he has been compliant with insulin. His blood sugar has not been this high in some time. He checked his blood sugar at 1400 and was 177.

## 2016-09-04 NOTE — Telephone Encounter (Signed)
Britta MccreedyBarbara from [facility] is calling to give blood sugar readings for pt. [facility] Guy Francoravia Benjamin.

## 2016-09-04 NOTE — Telephone Encounter (Signed)
Britta MccreedyBarbara, home health nurse called to report blood sugar of 520 this a.m. He took 12 unit of insulin according to the sliding scale at 1045. His blood sugar is now 474. Instructed to drink plenty of water and walk around be active if he is not too weak today. [facility]  States he has been walking around. Informed that doctor will be notified. Will call back.

## 2016-09-04 NOTE — Telephone Encounter (Signed)
He should be taking 25 units of Lantus at bedtime. If he has been compliant with this and sugars are still elevated advised to increase to 35 units at bedtime.

## 2016-09-09 ENCOUNTER — Other Ambulatory Visit: Payer: Self-pay | Admitting: Family Medicine

## 2016-09-09 DIAGNOSIS — Z794 Long term (current) use of insulin: Principal | ICD-10-CM

## 2016-09-09 DIAGNOSIS — E118 Type 2 diabetes mellitus with unspecified complications: Secondary | ICD-10-CM

## 2016-09-09 MED ORDER — ACCU-CHEK AVIVA DEVI: 0 refills | Status: DC

## 2016-09-09 MED FILL — ACCU-CHEK SOFTCLIX LANCETS: 30 days supply | Qty: 100 | Fill #0

## 2016-09-10 ENCOUNTER — Other Ambulatory Visit: Payer: Self-pay

## 2016-09-10 DIAGNOSIS — E118 Type 2 diabetes mellitus with unspecified complications: Secondary | ICD-10-CM

## 2016-09-10 DIAGNOSIS — Z794 Long term (current) use of insulin: Principal | ICD-10-CM

## 2016-09-10 MED ORDER — ACCU-CHEK AVIVA DEVI: 0 refills | Status: DC

## 2016-09-10 MED FILL — ACCU-CHEK AVIVA PLUS W/DEVI: W/DEVICE | 1 days supply | Qty: 1 | Fill #0

## 2016-09-30 MED FILL — LANTUS SOLOSTAR 100 UNITS/M: 100 | 30 days supply | Qty: 9 | Fill #0

## 2016-10-01 MED FILL — NOVOLOG FLEXPEN SYRINGE: 100 | 33 days supply | Qty: 12 | Fill #0

## 2016-10-11 ENCOUNTER — Emergency Department (HOSPITAL_COMMUNITY)
Admission: EM | Admit: 2016-10-11 | Discharge: 2016-10-11 | Disposition: A | Discharge status: Home / Self Care | Payer: Medicare Other | Attending: Emergency Medicine | Admitting: Emergency Medicine

## 2016-10-11 ENCOUNTER — Encounter (HOSPITAL_COMMUNITY): Payer: Self-pay | Admitting: Emergency Medicine

## 2016-10-11 DIAGNOSIS — L02413 Cutaneous abscess of right upper limb: Secondary | ICD-10-CM | POA: Diagnosis not present

## 2016-10-11 DIAGNOSIS — F17210 Nicotine dependence, cigarettes, uncomplicated: Secondary | ICD-10-CM | POA: Diagnosis not present

## 2016-10-11 DIAGNOSIS — Z79899 Other long term (current) drug therapy: Secondary | ICD-10-CM | POA: Diagnosis not present

## 2016-10-11 DIAGNOSIS — S41009A Unspecified open wound of unspecified shoulder, initial encounter: Secondary | ICD-10-CM | POA: Diagnosis not present

## 2016-10-11 DIAGNOSIS — E119 Type 2 diabetes mellitus without complications: Secondary | ICD-10-CM | POA: Diagnosis not present

## 2016-10-11 DIAGNOSIS — Z7982 Long term (current) use of aspirin: Secondary | ICD-10-CM | POA: Diagnosis not present

## 2016-10-11 DIAGNOSIS — B999 Unspecified infectious disease: Secondary | ICD-10-CM | POA: Diagnosis not present

## 2016-10-11 DIAGNOSIS — Z794 Long term (current) use of insulin: Secondary | ICD-10-CM | POA: Insufficient documentation

## 2016-10-11 LAB — CBG MONITORING, ED: Glucose-Capillary: 130 mg/dL — ABNORMAL HIGH (ref 65–99)

## 2016-10-11 MED ORDER — SULFAMETHOXAZOLE-TRIMETHOPRIM 800-160 MG PO TABS: 1.0000 | ORAL_TABLET | Freq: Two times a day (BID) | ORAL | 0 refills | Status: AC

## 2016-10-11 MED ORDER — CEPHALEXIN 500 MG PO CAPS: 500.0000 mg | ORAL_CAPSULE | Freq: Four times a day (QID) | ORAL | 0 refills | Status: DC

## 2016-10-11 MED ORDER — LIDOCAINE-EPINEPHRINE (PF) 2 %-1:200000 IJ SOLN
10.0000 mL | Freq: Once | INTRAMUSCULAR | Status: AC
  Administered 2016-10-11: 10 mL
  Filled 2016-10-11: qty 20

## 2016-10-11 MED ORDER — OXYCODONE-ACETAMINOPHEN 5-325 MG PO TABS: 1.0000 | ORAL_TABLET | Freq: Four times a day (QID) | ORAL | 0 refills | Status: DC | PRN

## 2016-10-11 MED ORDER — OXYCODONE-ACETAMINOPHEN 5-325 MG PO TABS
1.0000 | ORAL_TABLET | Freq: Once | ORAL | Status: AC
  Administered 2016-10-11: 1 via ORAL
  Filled 2016-10-11: qty 1

## 2016-10-11 NOTE — ED Provider Notes (Signed)
[facility]-EMERGENCY DEPT Provider Note   CSN: [id number] Arrival date & time: 10/11/16  1051     History   Chief Complaint Chief Complaint  Patient presents with  . Abscess    HPI Raymond Winters is a 56 y.o. male.  The history is provided by the patient.  Abscess  Associated symptoms: no fever    Patient with painful swelling of his right upper arm. Began around 1 week ago. Has gotten larger. No fevers. Has previous abscess on his right buttock. He is diabetic and states that sugar has been under 200. No drainage.  Past Medical History:  Diagnosis Date  . Alcohol dependence (HCC) 01/05/2016  . Alcoholic liver disease, unspecified (HCC) 01/05/2016  . Arthritis    "lower extremeties, hands, shoulders, back" (08/20/2016)  . Chronic alcoholic pancreatitis (HCC) 01/05/2016  . Depression   . Family history of adverse reaction to anesthesia    pt's sister has hx. of post-op N/V  . Hepatitis C    "never treated" (08/20/2016)  . Heroin abuse   . High cholesterol   . Hypoalbuminemia due to protein-calorie malnutrition (HCC) 01/05/2016  . Insulin dependent diabetes mellitus (HCC)   . Lactic acid increased, CHRONIC 01/05/2016   Secondary to Liver Disease, Alcoholism, and Diabetes  . 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 Shoulder wound 01/2016   left    Patient Active Problem List   Diagnosis Date Noted  . Necrotizing fasciitis (HCC) 08/19/2016  . Hypoglycemia 06/22/2016  . Depression 02/03/2016  . Chronic hepatitis C (HCC)   . Alcoholic liver disease, unspecified (HCC) 01/05/2016  . Alcohol dependence (HCC) 01/05/2016  . Chronic alcoholic pancreatitis (HCC) 01/05/2016  . Hypoalbuminemia due to protein-calorie malnutrition (HCC) 01/05/2016  . Lactic acid increased, CHRONIC 01/05/2016  . Diarrhea 01/04/2016  . Chronic diarrhea 01/04/2016  . Pressure ulcer 01/04/2016  . Uncontrolled type 2 diabetes mellitus with complication, with long-term current use of insulin  (HCC)   . Swelling   . Type 2 diabetes mellitus with complication, with long-term current use of insulin (HCC)   . Septic joint of left shoulder region (HCC)   . Abscess 12/24/2015  . Abscess of left shoulder   . Leukocytosis   . Type 2 diabetes mellitus with complication, without long-term current use of insulin (HCC)   . Glenohumeral arthritis 07/24/2015  . Left shoulder pain 07/24/2015  . 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 Tobacco abuse 07/26/2014  . Protein-calorie malnutrition, severe (HCC) 12/05/2013  . Hyperkalemia 12/04/2013  . Heroin withdrawal (HCC) 12/04/2013  . Alcohol abuse 12/04/2013  . Hyperglycemia 12/04/2013  . Metabolic acidosis with respiratory alkalosis 12/04/2013  . Heroin abuse 04/04/2013  . DM2 (diabetes mellitus, type 2) (HCC) 03/20/2013    Past Surgical History:  Procedure Laterality Date  . APPLICATION OF WOUND VAC Left 12/24/2015   Procedure: APPLICATION OF WOUND VAC; LEFT SHOULDER;  Surgeon: Tarry Kos, MD;  Location: [facility] OR;  Service: Orthopedics;  Laterality: Left;  . APPLICATION OF WOUND VAC Left 01/29/2016   Procedure: LEFT SHOULDER SPLIT THICKNESS SKIN GRAFT, WOUND VAC;  Surgeon: Tarry Kos, MD;  Location: [facility];  Service: Orthopedics;  Laterality: Left;  . I&D EXTREMITY Right 03/20/2013   Procedure: IRRIGATION AND DEBRIDEMENT EXTREMITY;  Surgeon: Marlowe Shores, MD;  Location: [facility] OR;  Service: Orthopedics;  Laterality: Right;  . I&D EXTREMITY Left 12/24/2015   Procedure:  DEBRIDEMENT OF MUSCLE, SUBCUTANEOUS TISSUE LEFT SHOULDER;  Surgeon: Edwin Cap  Roda Shutters, MD;  Location: [facility] OR;  Service: Orthopedics;  Laterality: Left;  . INCISION AND DRAINAGE ABSCESS Right 08/19/2016   Procedure: DEBRIDEMENT RIGHT BUTTOCK  ABSCESS;  Surgeon: Manus Rudd, MD;  Location[facility] OR;  Service: General;  Laterality: Right;  . IRRIGATION AND DEBRIDEMENT ABSCESS Right 08/19/2016   buttocks  . IRRIGATION AND DEBRIDEMENT SHOULDER Left 12/27/2015   Procedure:  IRRIGATION AND DEBRIDEMENT LEFT SHOULDER;  Surgeon: Tarry Kos, MD;  Location: [facility] OR;  Service: Orthopedics;  Laterality: Left;  . IRRIGATION AND DEBRIDEMENT SHOULDER Left 12/30/2015   Procedure: IRRIGATION AND DEBRIDEMENT LEFT SHOULDER;  Surgeon: Tarry Kos, MD;  Location: [facility] OR;  Service: Orthopedics;  Laterality: Left;  . SKIN SPLIT GRAFT Left 01/29/2016   Procedure: SKIN GRAFT SPLIT THICKNESS,;  Surgeon: Tarry Kos, MD;  Location:  SURGERY CENTER;  Service: Orthopedics;  Laterality: Left;       Home Medications    Prior to Admission medications   Medication Sig Start Date End Date Taking? Authorizing Provider  aspirin EC 81 MG tablet Take 1 tablet (81 mg total) by mouth daily. 04/04/14   Ambrose Finland, NP  atorvastatin (LIPITOR) 40 MG tablet Take 1 tablet (40 mg total) by mouth daily. 06/25/16   Jaclyn Shaggy, MD  Blood Glucose Monitoring Suppl (ACCU-CHEK AVIVA) device Use as instructed 3 times before meals 09/10/16   Jaclyn Shaggy, MD  cephALEXin (KEFLEX) 500 MG capsule Take 1 capsule (500 mg total) by mouth 4 (four) times daily. 10/11/16   Benjiman Core, MD  ciprofloxacin (CIPRO) 500 MG tablet Take 1 tablet (500 mg total) by mouth 2 (two) times daily. 08/26/16   Jaclyn Shaggy, MD  gabapentin (NEURONTIN) 300 MG capsule TAKE 1 CAPSULE BY MOUTH AT BEDTIME FOR DIABETIC NERVE PAIN 06/25/16   Jaclyn Shaggy, MD  glucose blood (ACCU-CHEK AVIVA) test strip Use as instructed 3 times before meals 08/26/16   Jaclyn Shaggy, MD  insulin aspart (NOVOLOG FLEXPEN) 100 UNIT/ML FlexPen 0-12 units before meals as per sliding scale as directed 08/26/16   Jaclyn Shaggy, MD  Insulin Glargine (LANTUS SOLOSTAR) 100 UNIT/ML Solostar Pen Inject 25 Units into the skin daily at 10 pm. 09/02/16   Jaclyn Shaggy, MD  Lancet [facility]) lancets Use as instructed 3 times before meals 08/26/16   Jaclyn Shaggy, MD  meloxicam (MOBIC) 7.5 MG tablet Take 1 tablet (7.5 mg total) by mouth daily as needed for  pain. Patient not taking: Reported on 09/02/2016 08/26/16   Jaclyn Shaggy, MD  mirtazapine (REMERON) 30 MG tablet Take 1 tablet (30 mg total) by mouth at bedtime. 09/02/16   Jaclyn Shaggy, MD  Multiple Vitamins-Minerals (MULTIVITAMIN WITH MINERALS) tablet Take 1 tablet by mouth daily.    Historical Provider, MD  oxyCODONE (OXY IR/ROXICODONE) 5 MG immediate release tablet Take 1 tablet (5 mg total) by mouth every 6 (six) hours as needed for moderate pain (before dressing changes). 08/25/16   Jerre Simon, PA  oxyCODONE-acetaminophen (PERCOCET/ROXICET) 5-325 MG tablet Take 1-2 tablets by mouth every 6 (six) hours as needed for severe pain. 10/11/16   Benjiman Core, MD  Pancrelipase, Lip-Prot-Amyl, (ZENPEP) 40000-136000 units CPEP 40,000 by mouth 3 times daily Patient taking differently: Take 40,000 Units by mouth 3 (three) times daily. 40,000 by mouth 3 times daily 07/29/16   Jaclyn Shaggy, MD  sulfamethoxazole-trimethoprim (BACTRIM DS,SEPTRA DS) 800-160 MG tablet Take 1 tablet by mouth 2 (two) times daily. 10/11/16 10/16/16  Benjiman Core, MD  traMADol (ULTRAM) 50 MG  tablet Take 1 tablet (50 mg total) by mouth every 6 (six) hours as needed. 08/26/16   Jaclyn Shaggy, MD    Family History Family History  Problem Relation Age of Onset  . Diabetes Mother   . Heart disease Mother   . Hypertension Mother   . Stroke Mother   . Vision loss Mother   . Heart disease Father   . Hypertension Father   . Anesthesia problems Sister     post-op N/V    Social History Social History  Substance Use Topics  . Smoking status: Current Every Day Smoker    Packs/day: 0.50    Years: 37.00    Types: Cigarettes  . Smokeless tobacco: Never Used  . Alcohol use No     Comment: 08/20/2016 "stopped ~ 1 month ago"     Allergies   Ativan [lorazepam]   Review of Systems Review of Systems  Constitutional: Negative for appetite change, chills and fever.  Cardiovascular: Negative for chest pain.  Gastrointestinal:  Negative for abdominal pain.  Skin: Positive for wound. Negative for color change.  Hematological: Does not bruise/bleed easily.     Physical Exam Updated Vital Signs BP 94/73 (BP Location: Left Arm)   Pulse (!) 103   Temp 98.3 F (36.8 C) (Oral)   Resp 18   Ht  (1.702 m)   Wt 125 lb (56.7 kg)   SpO2 99%   BMI 19.58 kg/m   Physical Exam  Constitutional: He appears well-developed.  Patient is very thin  Eyes: EOM are normal.  Neck: Neck supple.  Cardiovascular: Normal rate.   Musculoskeletal:  On right upper arm laterally there is approximately 4 cm tender fluctuant area. Neurovascular intact distally. No drainage.  Skin: Skin is warm. Capillary refill takes less than 2 seconds.     ED Treatments / Results  Labs (all labs ordered are listed, but only abnormal results are displayed) Labs Reviewed  CBG MONITORING, ED - Abnormal; Notable for the following:       Result Value   Glucose-Capillary 130 (*)    All other components within normal limits    EKG  EKG Interpretation None       Radiology No results found.  Procedures Procedures (including critical care time)  Medications Ordered in ED Medications  lidocaine-EPINEPHrine (XYLOCAINE W/EPI) 2 %-1:200000 (PF) injection 10 mL (10 mLs Infiltration Given by Other 10/11/16 1206)  oxyCODONE-acetaminophen (PERCOCET/ROXICET) 5-325 MG per tablet 1 tablet (1 tablet Oral Given 10/11/16 1205)     Initial Impression / Assessment and Plan / ED Course  I have reviewed the triage vital signs and the nursing notes.  Pertinent labs & imaging results that were available during my care of the patient were reviewed by me and considered in my medical decision making (see chart for details).     Patient with right arm abscess. Drained. Sugar well controlled. With comorbidities will treat with antibiotics also. Discharge home. INCISION AND DRAINAGE Performed by: Billee Cashing. Consent: Verbal consent  obtained. Risks and benefits: risks, benefits and alternatives were discussed Type: abscess  Body area:right upper arm  Anesthesia: local infiltration  Incision was made with a scalpel.  Local anesthetic: lidocaine 2% with epinephrine  Anesthetic total: 5 ml  Complexity: simple  Drainage: purulent  Drainage amount: moderate  Packing material: 1/4 in iodoform gauze  Patient tolerance: Patient tolerated the procedure well with no immediate complications.    Final Clinical Impressions(s) / ED Diagnoses   Final diagnoses:  Abscess  of arm, right    New Prescriptions New Prescriptions   CEPHALEXIN (KEFLEX) 500 MG CAPSULE    Take 1 capsule (500 mg total) by mouth 4 (four) times daily.   OXYCODONE-ACETAMINOPHEN (PERCOCET/ROXICET) 5-325 MG TABLET    Take 1-2 tablets by mouth every 6 (six) hours as needed for severe pain.   SULFAMETHOXAZOLE-TRIMETHOPRIM (BACTRIM DS,SEPTRA DS) 800-160 MG TABLET    Take 1 tablet by mouth 2 (two) times daily.     Benjiman Core, MD 10/11/16 1247

## 2016-10-11 NOTE — ED Notes (Signed)
Pt's CBG result was 130. Informed Greg - RN.

## 2016-10-11 NOTE — ED Triage Notes (Signed)
Arrived via EMS. Onset one week ago developed abscess right upper arm increasing in size and pain overtime.

## 2016-10-12 ENCOUNTER — Encounter: Payer: Self-pay | Admitting: Family Medicine

## 2016-10-12 ENCOUNTER — Ambulatory Visit: Payer: Medicare Other | Attending: Family Medicine | Admitting: Family Medicine

## 2016-10-12 VITALS — BP 101/71 | HR 92 | Temp 97.7°F | Ht 67.0 in | Wt 111.4 lb

## 2016-10-12 DIAGNOSIS — E118 Type 2 diabetes mellitus with unspecified complications: Secondary | ICD-10-CM | POA: Diagnosis not present

## 2016-10-12 DIAGNOSIS — M726 Necrotizing fasciitis: Secondary | ICD-10-CM | POA: Diagnosis not present

## 2016-10-12 DIAGNOSIS — Z79899 Other long term (current) drug therapy: Secondary | ICD-10-CM | POA: Insufficient documentation

## 2016-10-12 DIAGNOSIS — E1140 Type 2 diabetes mellitus with diabetic neuropathy, unspecified: Secondary | ICD-10-CM | POA: Diagnosis not present

## 2016-10-12 DIAGNOSIS — Z794 Long term (current) use of insulin: Secondary | ICD-10-CM | POA: Diagnosis not present

## 2016-10-12 DIAGNOSIS — Z7982 Long term (current) use of aspirin: Secondary | ICD-10-CM | POA: Diagnosis not present

## 2016-10-12 DIAGNOSIS — E1100 Type 2 diabetes mellitus with hyperosmolarity without nonketotic hyperglycemic-hyperosmolar coma (NKHHC): Secondary | ICD-10-CM | POA: Insufficient documentation

## 2016-10-12 LAB — GLUCOSE, POCT (MANUAL RESULT ENTRY): POC Glucose: 461 mg/dl — AB (ref 70–99)

## 2016-10-12 MED ORDER — TRAMADOL HCL 50 MG PO TABS: 50.0000 mg | ORAL_TABLET | Freq: Four times a day (QID) | ORAL | 0 refills | Status: DC | PRN

## 2016-10-12 MED ORDER — INSULIN ASPART 100 UNIT/ML ~~LOC~~ SOLN
10.0000 [IU] | Freq: Once | SUBCUTANEOUS | Status: AC
  Administered 2016-10-12: 10 [IU] via SUBCUTANEOUS

## 2016-10-12 MED ORDER — INSULIN GLARGINE 100 UNIT/ML SOLOSTAR PEN: 35.0000 [IU] | PEN_INJECTOR | Freq: Every day | SUBCUTANEOUS | 3 refills | Status: DC

## 2016-10-12 NOTE — Progress Notes (Signed)
Refills on all meds 

## 2016-10-12 NOTE — Progress Notes (Signed)
Subjective:  Patient ID: Raymond Winters, male    DOB: 1961/01/05  Age: 56 y.o. MRN: [id number]  CC: Diabetes and Hip Pain (right sided)   HPI Raymond Winters is a 56 year old male with history of type 2 diabetes mellitus (A1c 10.9 up from 7.8 previously), diabetic neuropathy, chronic alcoholic pancreatitis, substance abuse, necrotizing fasciitis of the right buttock status post debridement who presents today For follow-up visit.  He was seen at the ED yesterday due to drainage from his right hip wound and was placed on Bactrim. He had it dressed today by his home health nurse but complains of pain at the site. He has an appointment with his surgeon tomorrow.  His blood sugar is elevated at 461 today and he endorses compliance with 25 units of Lantus and Novolog sliding scale however he has been non adherent with a diabetic diet.   Past Medical History:  Diagnosis Date  . Alcohol dependence (HCC) 01/05/2016  . Alcoholic liver disease, unspecified (HCC) 01/05/2016  . Arthritis    "lower extremeties, hands, shoulders, back" (08/20/2016)  . Chronic alcoholic pancreatitis (HCC) 01/05/2016  . Depression   . Family history of adverse reaction to anesthesia    pt's sister has hx. of post-op N/V  . Hepatitis C    "never treated" (08/20/2016)  . Heroin abuse   . High cholesterol   . Hypoalbuminemia due to protein-calorie malnutrition (HCC) 01/05/2016  . Insulin dependent diabetes mellitus (HCC)   . Lactic acid increased, CHRONIC 01/05/2016   Secondary to Liver Disease, Alcoholism, and Diabetes  . 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 Shoulder wound 01/2016   left    Past Surgical History:  Procedure Laterality Date  . APPLICATION OF WOUND VAC Left 12/24/2015   Procedure: APPLICATION OF WOUND VAC; LEFT SHOULDER;  Surgeon: Tarry Kos, MD;  Location: [facility] OR;  Service: Orthopedics;  Laterality: Left;  . APPLICATION OF WOUND VAC Left 01/29/2016   Procedure: LEFT SHOULDER SPLIT  THICKNESS SKIN GRAFT, WOUND VAC;  Surgeon: Tarry Kos, MD;  Location: [facility];  Service: Orthopedics;  Laterality: Left;  . I&D EXTREMITY Right 03/20/2013   Procedure: IRRIGATION AND DEBRIDEMENT EXTREMITY;  Surgeon: Marlowe Shores, MD;  Location: [facility] OR;  Service: Orthopedics;  Laterality: Right;  . I&D EXTREMITY Left 12/24/2015   Procedure:  DEBRIDEMENT OF MUSCLE, SUBCUTANEOUS TISSUE LEFT SHOULDER;  Surgeon: Tarry Kos, MD;  Location: [facility] OR;  Service: Orthopedics;  Laterality: Left;  . INCISION AND DRAINAGE ABSCESS Right 08/19/2016   Procedure: DEBRIDEMENT RIGHT BUTTOCK  ABSCESS;  Surgeon: Manus Rudd, MD;  Location: [facility] OR;  Service: General;  Laterality: Right;  . IRRIGATION AND DEBRIDEMENT ABSCESS Right 08/19/2016   buttocks  . IRRIGATION AND DEBRIDEMENT SHOULDER Left 12/27/2015   Procedure: IRRIGATION AND DEBRIDEMENT LEFT SHOULDER;  Surgeon: Tarry Kos, MD;  Location: [facility];  Service: Orthopedics;  Laterality: Left;  . IRRIGATION AND DEBRIDEMENT SHOULDER Left 12/30/2015   Procedure: IRRIGATION AND DEBRIDEMENT LEFT SHOULDER;  Surgeon: Tarry Kos, MD;  Location: [facility];  Service: Orthopedics;  Laterality: Left;  . SKIN SPLIT GRAFT Left 01/29/2016   Procedure: SKIN GRAFT SPLIT THICKNESS,;  Surgeon: Tarry Kos, MD;  Location: [facility];  Service: Orthopedics;  Laterality: Left;    Allergies  Allergen Reactions  . Ativan [Lorazepam] Other (See Comments)    HALLUCINATIONS     Outpatient Medications Prior to Visit  Medication Sig Dispense Refill  .  aspirin EC 81 MG tablet Take 1 tablet (81 mg total) by mouth daily. 30 tablet 5  . atorvastatin (LIPITOR) 40 MG tablet Take 1 tablet (40 mg total) by mouth daily. 30 tablet 5  . Blood Glucose Monitoring Suppl (ACCU-CHEK AVIVA) device Use as instructed 3 times before meals 1 each 0  . cephALEXin (KEFLEX) 500 MG capsule Take 1 capsule (500 mg total) by mouth 4 (four) times daily. 20 capsule 0  . gabapentin  (NEURONTIN) 300 MG capsule TAKE 1 CAPSULE BY MOUTH AT BEDTIME FOR DIABETIC NERVE PAIN 30 capsule 5  . glucose blood (ACCU-CHEK AVIVA) test strip Use as instructed 3 times before meals 100 each 12  . insulin aspart (NOVOLOG FLEXPEN) 100 UNIT/ML FlexPen 0-12 units before meals as per sliding scale as directed 15 mL 3  . Lancet Devices (ACCU-CHEK SOFTCLIX) lancets Use as instructed 3 times before meals 1 each 5  . meloxicam (MOBIC) 7.5 MG tablet Take 1 tablet (7.5 mg total) by mouth daily as needed for pain. 30 tablet 1  . mirtazapine (REMERON) 30 MG tablet Take 1 tablet (30 mg total) by mouth at bedtime. 30 tablet 3  . Pancrelipase, Lip-Prot-Amyl, (ZENPEP) 40000-136000 units CPEP 40,000 by mouth 3 times daily (Patient taking differently: Take 40,000 Units by mouth 3 (three) times daily. 40,000 by mouth 3 times daily) 90 capsule 3  . sulfamethoxazole-trimethoprim (BACTRIM DS,SEPTRA DS) 800-160 MG tablet Take 1 tablet by mouth 2 (two) times daily. 10 tablet 0  . Insulin Glargine (LANTUS SOLOSTAR) 100 UNIT/ML Solostar Pen Inject 25 Units into the skin daily at 10 pm. 15 mL 3  . Multiple Vitamins-Minerals (MULTIVITAMIN WITH MINERALS) tablet Take 1 tablet by mouth daily.    Marland Kitchen oxyCODONE (OXY IR/ROXICODONE) 5 MG immediate release tablet Take 1 tablet (5 mg total) by mouth every 6 (six) hours as needed for moderate pain (before dressing changes). (Patient not taking: Reported on 10/12/2016) 15 tablet 0  . oxyCODONE-acetaminophen (PERCOCET/ROXICET) 5-325 MG tablet Take 1-2 tablets by mouth every 6 (six) hours as needed for severe pain. (Patient not taking: Reported on 10/12/2016) 4 tablet 0  . ciprofloxacin (CIPRO) 500 MG tablet Take 1 tablet (500 mg total) by mouth 2 (two) times daily. 20 tablet 0  . traMADol (ULTRAM) 50 MG tablet Take 1 tablet (50 mg total) by mouth every 6 (six) hours as needed. (Patient not taking: Reported on 10/12/2016) 60 tablet 0   No facility-administered medications prior to visit.      ROS Review of Systems  Constitutional: Negative for activity change and appetite change.  HENT: Negative for sinus pressure and sore throat.   Eyes: Negative for visual disturbance.  Respiratory: Negative for cough, chest tightness and shortness of breath.   Cardiovascular: Negative for chest pain and leg swelling.  Gastrointestinal: Negative for abdominal distention, abdominal pain, constipation and diarrhea.  Endocrine: Negative.   Genitourinary: Negative for dysuria.  Musculoskeletal: Negative for joint swelling and myalgias.  Skin: Positive for wound. Negative for rash.  Allergic/Immunologic: Negative.   Neurological: Negative for weakness, light-headedness and numbness.  Psychiatric/Behavioral: Negative for dysphoric mood and suicidal ideas.    Objective:  BP 101/71 (BP Location: Right Arm, Patient Position: Sitting, Cuff Size: Small)   Pulse 92   Temp 97.7 F (36.5 C) (Oral)   Ht  (1.702 m)   Wt 111 lb 6.4 oz (50.5 kg)   SpO2 98%   BMI 17.45 kg/m   BP/Weight 10/12/2016 10/11/2016 09/02/2016  Systolic BP 101 101  100  Diastolic BP 71 77 68  Wt. (Lbs) 111.4 125 123  BMI 17.45 19.58 19.26      Physical Exam Constitutional: He is oriented to person, place, and time. He appears well-developed and well-nourished.  Cardiovascular: Normal rate, normal heart sounds and intact distal pulses.   No murmur heard. Pulmonary/Chest: Effort normal and breath sounds normal. He has no wheezes. He has no rales. He exhibits no tenderness.  Abdominal: Soft. Bowel sounds are normal. He exhibits no distension and no mass. There is no tenderness.  Musculoskeletal: Normal range of motion.  Neurological: He is alert and oriented to person, place, and time.  Skin: Wound in place in right gluteal region.  Lab Results  Component Value Date   HGBA1C 10.9 (H) 08/22/2016    Assessment & Plan:   1. Type 2 diabetes mellitus with hyperosmolarity without coma, without long-term current  use of insulin (HCC) Uncontrolled with A1c 10.9 Increase Lantus to 35 units; continue Novolog Patient to decrease Lantus by 2 units in the event of hypoglycemia  2. Necrotizing fasciitis (HCC) Dressing in place Currently on Bactrim Keep appt with Gen Surgeon tomorrow - traMADol (ULTRAM) 50 MG tablet; Take 1 tablet (50 mg total) by mouth every 6 (six) hours as needed.  Dispense: 30 tablet; Refill: 0  3. Type 2 diabetes mellitus with complication, with long-term current use of insulin (HCC) - Glucose (CBG) 10  Units of Novolog administered due to blood sugar of 461; patient  Observes for 30 min and blood sugar repeated - Insulin Glargine (LANTUS SOLOSTAR) 100 UNIT/ML Solostar Pen; Inject 35 Units into the skin daily at 10 pm.  Dispense: 5 pen; Refill: 3   Meds ordered this encounter  Medications  . traMADol (ULTRAM) 50 MG tablet    Sig: Take 1 tablet (50 mg total) by mouth every 6 (six) hours as needed.    Dispense:  30 tablet    Refill:  0  . Insulin Glargine (LANTUS SOLOSTAR) 100 UNIT/ML Solostar Pen    Sig: Inject 35 Units into the skin daily at 10 pm.    Dispense:  5 pen    Refill:  3    Discontinue previous dose    Follow-up: Return in about 6 weeks (around 11/23/2016) for Follow-up on diabetes mellitus.   Jaclyn Shaggy MD

## 2016-10-14 ENCOUNTER — Telehealth: Payer: Self-pay | Admitting: Family Medicine

## 2016-10-14 ENCOUNTER — Other Ambulatory Visit: Payer: Self-pay | Admitting: *Deleted

## 2016-10-14 DIAGNOSIS — Z794 Long term (current) use of insulin: Principal | ICD-10-CM

## 2016-10-14 DIAGNOSIS — E118 Type 2 diabetes mellitus with unspecified complications: Secondary | ICD-10-CM

## 2016-10-14 MED ORDER — INSULIN GLARGINE 100 UNIT/ML SOLOSTAR PEN: 35.0000 [IU] | PEN_INJECTOR | Freq: Every day | SUBCUTANEOUS | 3 refills | Status: DC

## 2016-10-14 MED ORDER — INSULIN ASPART 100 UNIT/ML FLEXPEN: PEN_INJECTOR | SUBCUTANEOUS | 2 refills | Status: DC

## 2016-10-14 NOTE — Telephone Encounter (Signed)
Patient came to the office to request pen needles for Novolog and Lantus. Please send it to our pharmacy.   Thank you.

## 2016-11-02 MED FILL — GABAPENTIN 300 MG CAPSULE: 300 | 30 days supply | Qty: 30 | Fill #1

## 2016-11-02 MED FILL — LANTUS SOLOSTAR 100 UNITS/M: 100 | 30 days supply | Qty: 9 | Fill #1

## 2016-11-03 ENCOUNTER — Other Ambulatory Visit: Payer: Self-pay | Admitting: Family Medicine

## 2016-11-03 MED ORDER — PANCRELIPASE (LIP-PROT-AMYL) 25000 UNITS PO CPEP: 25000.0000 [IU] | ORAL_CAPSULE | Freq: Three times a day (TID) | ORAL | 3 refills | Status: DC

## 2016-11-03 MED ORDER — PANCRELIPASE (LIP-PROT-AMYL) 25000 UNITS PO CPEP
25000.0000 [IU] | ORAL_CAPSULE | Freq: Three times a day (TID) | ORAL | 3 refills | Status: AC
Start: 2016-11-03 — End: ?

## 2016-11-03 MED FILL — ZENPEP 25000-79000 UNIT CAP: 25000-79000 | 33 days supply | Qty: 100 | Fill #0

## 2016-11-06 ENCOUNTER — Telehealth: Payer: Self-pay | Admitting: Family Medicine

## 2016-11-06 DIAGNOSIS — M726 Necrotizing fasciitis: Secondary | ICD-10-CM

## 2016-11-06 MED ORDER — TRAMADOL HCL 50 MG PO TABS: 50.0000 mg | ORAL_TABLET | Freq: Four times a day (QID) | ORAL | 0 refills | Status: DC | PRN

## 2016-11-06 MED ORDER — INSULIN PEN NEEDLE 31G X 5 MM MISC: 5 refills | Status: DC

## 2016-11-06 NOTE — Telephone Encounter (Signed)
Ready for pick up

## 2016-11-06 NOTE — Telephone Encounter (Signed)
PT came to the office requesting a refill for traMADol (ULTRAM) 50 MG tablet  Please follow up with PT

## 2016-12-07 ENCOUNTER — Emergency Department (HOSPITAL_COMMUNITY)
Admission: EM | Admit: 2016-12-07 | Discharge: 2016-12-07 | Disposition: A | Discharge status: Home / Self Care | Payer: Medicare Other | Attending: Emergency Medicine | Admitting: Emergency Medicine

## 2016-12-07 ENCOUNTER — Encounter (HOSPITAL_COMMUNITY): Payer: Self-pay

## 2016-12-07 DIAGNOSIS — Z794 Long term (current) use of insulin: Secondary | ICD-10-CM | POA: Insufficient documentation

## 2016-12-07 DIAGNOSIS — E119 Type 2 diabetes mellitus without complications: Secondary | ICD-10-CM | POA: Insufficient documentation

## 2016-12-07 DIAGNOSIS — L03115 Cellulitis of right lower limb: Secondary | ICD-10-CM | POA: Insufficient documentation

## 2016-12-07 DIAGNOSIS — G8911 Acute pain due to trauma: Secondary | ICD-10-CM | POA: Diagnosis not present

## 2016-12-07 DIAGNOSIS — Z7982 Long term (current) use of aspirin: Secondary | ICD-10-CM | POA: Diagnosis not present

## 2016-12-07 DIAGNOSIS — Z79899 Other long term (current) drug therapy: Secondary | ICD-10-CM | POA: Diagnosis not present

## 2016-12-07 DIAGNOSIS — M25571 Pain in right ankle and joints of right foot: Secondary | ICD-10-CM | POA: Diagnosis present

## 2016-12-07 DIAGNOSIS — T50905A Adverse effect of unspecified drugs, medicaments and biological substances, initial encounter: Secondary | ICD-10-CM | POA: Diagnosis not present

## 2016-12-07 DIAGNOSIS — F17210 Nicotine dependence, cigarettes, uncomplicated: Secondary | ICD-10-CM | POA: Diagnosis not present

## 2016-12-07 LAB — BASIC METABOLIC PANEL
Anion gap: 13 (ref 5–15)
BUN: 16 mg/dL (ref 6–20)
CO2: 25 mmol/L (ref 22–32)
Calcium: 9.6 mg/dL (ref 8.9–10.3)
Chloride: 95 mmol/L — ABNORMAL LOW (ref 101–111)
Creatinine, Ser: 1.39 mg/dL — ABNORMAL HIGH (ref 0.61–1.24)
GFR, EST NON AFRICAN AMERICAN: 55 mL/min — AB (ref >60)
Glucose, Bld: 186 mg/dL — ABNORMAL HIGH (ref 65–99)
POTASSIUM: 4.2 mmol/L (ref 3.5–5.1)
SODIUM: 133 mmol/L — AB (ref 135–145)

## 2016-12-07 LAB — CBC
HCT: 42 % (ref 39.0–52.0)
Hemoglobin: 13.9 g/dL (ref 13.0–17.0)
MCH: 25.4 pg — ABNORMAL LOW (ref 26.0–34.0)
MCHC: 33.1 g/dL (ref 30.0–36.0)
MCV: 76.6 fL — ABNORMAL LOW (ref 78.0–100.0)
PLATELETS: 274 10*3/uL (ref 150–400)
RBC: 5.48 MIL/uL (ref 4.22–5.81)
RDW: 14 % (ref 11.5–15.5)
WBC: 7.2 10*3/uL (ref 4.0–10.5)

## 2016-12-07 LAB — CBG MONITORING, ED: Glucose-Capillary: 430 mg/dL — ABNORMAL HIGH (ref 65–99)

## 2016-12-07 LAB — URINALYSIS, ROUTINE W REFLEX MICROSCOPIC
Bilirubin Urine: NEGATIVE
Glucose, UA: >=500 mg/dL — AB
Ketones, ur: NEGATIVE mg/dL
NITRITE: NEGATIVE
PH: 6 (ref 5.0–8.0)
Protein, ur: 30 mg/dL — AB
SPECIFIC GRAVITY, URINE: 1.017 (ref 1.005–1.030)

## 2016-12-07 MED ORDER — SODIUM CHLORIDE 0.9 % IV BOLUS (SEPSIS)
1000.0000 mL | Freq: Once | INTRAVENOUS | Status: AC
  Administered 2016-12-07: 1000 mL via INTRAVENOUS

## 2016-12-07 MED ORDER — DEXTROSE 5 % IV SOLN
1.0000 g | Freq: Once | INTRAVENOUS | Status: AC
  Administered 2016-12-07: 1 g via INTRAVENOUS
  Filled 2016-12-07: qty 10

## 2016-12-07 MED ORDER — HYDROMORPHONE HCL 1 MG/ML IJ SOLN
1.0000 mg | Freq: Once | INTRAMUSCULAR | Status: AC
  Administered 2016-12-07: 1 mg via INTRAVENOUS
  Filled 2016-12-07: qty 1

## 2016-12-07 MED ORDER — OXYCODONE-ACETAMINOPHEN 5-325 MG PO TABS: 1.0000 | ORAL_TABLET | ORAL | 0 refills | Status: DC | PRN

## 2016-12-07 MED ORDER — AMOXICILLIN-POT CLAVULANATE 875-125 MG PO TABS: 1.0000 | ORAL_TABLET | Freq: Two times a day (BID) | ORAL | 0 refills | Status: DC

## 2016-12-07 MED ORDER — KETOROLAC TROMETHAMINE 30 MG/ML IJ SOLN
30.0000 mg | Freq: Once | INTRAMUSCULAR | Status: AC
  Administered 2016-12-07: 30 mg via INTRAVENOUS
  Filled 2016-12-07: qty 1

## 2016-12-07 MED ORDER — KETAMINE HCL-SODIUM CHLORIDE 100-0.9 MG/10ML-% IV SOSY
0.1500 mg/kg | PREFILLED_SYRINGE | Freq: Once | INTRAVENOUS | Status: AC
  Administered 2016-12-07: 7.8 mg via INTRAVENOUS
  Filled 2016-12-07: qty 10

## 2016-12-07 NOTE — ED Notes (Signed)
Pt A&OX4, NAD 

## 2016-12-07 NOTE — ED Triage Notes (Signed)
Pt arrives from home via PTAR reporting R ankle pain, reports trying to shoot up heroin into that ankle on Friday.  PTAR also reports pt hyperglycemic, CBG 541.

## 2016-12-07 NOTE — ED Notes (Signed)
Pt ambulatory at DC, reports sister coming to pick him up. NAD. Verbalizes DC teaching

## 2016-12-07 NOTE — ED Notes (Signed)
Pt reports he is IV drug user and attempted to shoot up in right inner ankle X3 days ago. Ankle is bruised and edematous. Pt reports he "did not actually use the site for heroin". Pt reports last IV drug use of heroin last night.

## 2016-12-07 NOTE — Discharge Instructions (Signed)
Elevate your right leg above the heart as much as possible.  Use moist heat on the sore area, several times each day.  Return here, if needed, for problems.

## 2016-12-07 NOTE — ED Notes (Signed)
Attempted IV right wrist, and right AC, unable to obtain. IV team consult placed after pt states "IV team always has to start my IVs".

## 2016-12-07 NOTE — ED Notes (Signed)
ED Provider at bedside. 

## 2016-12-07 NOTE — ED Notes (Signed)
Called pt's name to obtain vitals, pt in and out of waiting room to smoke.

## 2016-12-07 NOTE — ED Notes (Signed)
Pt CBG was 430, notified Jamie(RN)

## 2016-12-07 NOTE — ED Provider Notes (Signed)
[facility]-EMER[facility]ENCY DEPT Provider Note   CSN: [id number] Arrival date & time: 12/07/16  1129     History   Chief Complaint Chief Complaint  Patient presents with  . Ankle Pain  . Hyperglycemia    HPI Raymond Winters is a 56 y.o. male.  He presents for evaluation of right foot pain and swelling, since attempting to inject heroin into the area several days ago.  He denies fever, chills, nausea or vomiting.  Previous similar problem.  He states that he is an IV drug user.  He typically uses heroin.  He is taking his medicines regularly.  There are no other known modifying factors.  HPI  Past Medical History:  Diagnosis Date  . Alcohol dependence (HCC) 01/05/2016  . Alcoholic liver disease, unspecified (HCC) 01/05/2016  . Arthritis    "lower extremeties, hands, shoulders, back" (08/20/2016)  . Chronic alcoholic pancreatitis (HCC) 01/05/2016  . Depression   . Family history of adverse reaction to anesthesia    pt's sister has hx. of post-op N/V  . Hepatitis C    "never treated" (08/20/2016)  . Heroin abuse   . High cholesterol   . Hypoalbuminemia due to protein-calorie malnutrition (HCC) 01/05/2016  . Insulin dependent diabetes mellitus (HCC)   . Lactic acid increased, CHRONIC 01/05/2016   Secondary to Liver Disease, Alcoholism, and Diabetes  . 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 Shoulder wound 01/2016   left    Patient Active Problem List   Diagnosis Date Noted  . Necrotizing fasciitis (HCC) 08/19/2016  . Hypoglycemia 06/22/2016  . Depression 02/03/2016  . Chronic hepatitis C (HCC)   . Alcoholic liver disease, unspecified (HCC) 01/05/2016  . Alcohol dependence (HCC) 01/05/2016  . Chronic alcoholic pancreatitis (HCC) 01/05/2016  . Hypoalbuminemia due to protein-calorie malnutrition (HCC) 01/05/2016  . Lactic acid increased, CHRONIC 01/05/2016  . Diarrhea 01/04/2016  . Chronic diarrhea 01/04/2016  . Pressure ulcer 01/04/2016  . Uncontrolled type 2  diabetes mellitus with complication, with long-term current use of insulin (HCC)   . Swelling   . Type 2 diabetes mellitus with complication, with long-term current use of insulin (HCC)   . Septic joint of left shoulder region (HCC)   . Abscess 12/24/2015  . Abscess of left shoulder   . Leukocytosis   . Type 2 diabetes mellitus with complication, without long-term current use of insulin (HCC)   . Glenohumeral arthritis 07/24/2015  . Left shoulder pain 07/24/2015  . 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 Tobacco abuse 07/26/2014  . Protein-calorie malnutrition, severe (HCC) 12/05/2013  . Hyperkalemia 12/04/2013  . Heroin withdrawal (HCC) 12/04/2013  . Alcohol abuse 12/04/2013  . Hyperglycemia 12/04/2013  . Metabolic acidosis with respiratory alkalosis 12/04/2013  . Heroin abuse 04/04/2013  . DM2 (diabetes mellitus, type 2) (HCC) 03/20/2013    Past Surgical History:  Procedure Laterality Date  . APPLICATION OF WOUND VAC Left 12/24/2015   Procedure: APPLICATION OF WOUND VAC; LEFT SHOULDER;  Surgeon: Tarry KosNaiping M Xu, MD;  Location: [facility] OR;  Service: Orthopedics;  Laterality: Left;  . APPLICATION OF WOUND VAC Left 01/29/2016   Procedure: LEFT SHOULDER SPLIT THICKNESS SKIN GRAFT, WOUND VAC;  Surgeon: Tarry KosNaiping M Xu, MD;  Location: [facility];  Service: Orthopedics;  Laterality: Left;  . I&D EXTREMITY Right 03/20/2013   Procedure: IRRIGATION AND DEBRIDEMENT EXTREMITY;  Surgeon: Marlowe ShoresMatthew A Weingold, MD;  Location: [facility] OR;  Service: Orthopedics;  Laterality: Right;  . I&D EXTREMITY Left 12/24/2015  Procedure:  DEBRIDEMENT OF MUSCLE, SUBCUTANEOUS TISSUE LEFT SHOULDER;  Surgeon: Tarry Kos, MD;  Location: [facility] OR;  Service: Orthopedics;  Laterality: Left;  . INCISION AND DRAINAGE ABSCESS Right 08/19/2016   Procedure: DEBRIDEMENT RIGHT BUTTOCK  ABSCESS;  Surgeon: Manus Rudd, MD;  Location: [facility] OR;  Service: General;  Laterality: Right;  . IRRIGATION AND DEBRIDEMENT ABSCESS Right 08/19/2016    buttocks  . IRRIGATION AND DEBRIDEMENT SHOULDER Left 12/27/2015   Procedure: IRRIGATION AND DEBRIDEMENT LEFT SHOULDER;  Surgeon: Tarry Kos, MD;  Location: [facility];  Service: Orthopedics;  Laterality: Left;  . IRRIGATION AND DEBRIDEMENT SHOULDER Left 12/30/2015   Procedure: IRRIGATION AND DEBRIDEMENT LEFT SHOULDER;  Surgeon: Tarry Kos, MD;  Location: [facility] OR;  Service: Orthopedics;  Laterality: Left;  . SKIN SPLIT GRAFT Left 01/29/2016   Procedure: SKIN GRAFT SPLIT THICKNESS,;  Surgeon: Tarry Kos, MD;  Location: [facility];  Service: Orthopedics;  Laterality: Left;       Home Medications    Prior to Admission medications   Medication Sig Start Date End Date Taking? Authorizing Provider  amoxicillin-clavulanate (AUGMENTIN) 875-125 MG tablet Take 1 tablet by mouth 2 (two) times daily. One po bid x 7 days 12/07/16   Mancel Bale, MD  aspirin EC 81 MG tablet Take 1 tablet (81 mg total) by mouth daily. 04/04/14   Ambrose Finland, NP  atorvastatin (LIPITOR) 40 MG tablet Take 1 tablet (40 mg total) by mouth daily. 06/25/16   Jaclyn Shaggy, MD  Blood Glucose Monitoring Suppl (ACCU-CHEK AVIVA) device Use as instructed 3 times before meals 09/10/16   Jaclyn Shaggy, MD  cephALEXin (KEFLEX) 500 MG capsule Take 1 capsule (500 mg total) by mouth 4 (four) times daily. 10/11/16   Benjiman Core, MD  gabapentin (NEURONTIN) 300 MG capsule TAKE 1 CAPSULE BY MOUTH AT BEDTIME FOR DIABETIC NERVE PAIN 06/25/16   Jaclyn Shaggy, MD  glucose blood (ACCU-CHEK AVIVA) test strip Use as instructed 3 times before meals 08/26/16   Jaclyn Shaggy, MD  insulin aspart (NOVOLOG FLEXPEN) 100 UNIT/ML FlexPen 0-12 units before meals as per sliding scale as directed 10/14/16   Jaclyn Shaggy, MD  Insulin Glargine (LANTUS SOLOSTAR) 100 UNIT/ML Solostar Pen Inject 35 Units into the skin daily at 10 pm. 10/14/16   Jaclyn Shaggy, MD  Insulin Pen Needle 31G X 5 MM MISC Use 3 times daily before meals and at bedtime 11/06/16   Jaclyn Shaggy, MD  Lancet Devices [facility]) lancets Use as instructed 3 times before meals 08/26/16   Jaclyn Shaggy, MD  meloxicam (MOBIC) 7.5 MG tablet Take 1 tablet (7.5 mg total) by mouth daily as needed for pain. 08/26/16   Jaclyn Shaggy, MD  mirtazapine (REMERON) 30 MG tablet Take 1 tablet (30 mg total) by mouth at bedtime. 09/02/16   Jaclyn Shaggy, MD  Multiple Vitamins-Minerals (MULTIVITAMIN WITH MINERALS) tablet Take 1 tablet by mouth daily.    [provider]  oxyCODONE (OXY IR/ROXICODONE) 5 MG immediate release tablet Take 1 tablet (5 mg total) by mouth every 6 (six) hours as needed for moderate pain (before dressing changes). Patient not taking: Reported on 10/12/2016 08/25/16   Jerre Simon, PA  oxyCODONE-acetaminophen (PERCOCET) 5-325 MG tablet Take 1 tablet by mouth every 4 (four) hours as needed. 12/07/16   Mancel Bale, MD  Pancrelipase, Lip-Prot-Amyl, (ZENPEP) 25000 units CPEP Take 25,000 Units by mouth 3 (three) times daily before meals. 11/03/16   Jaclyn Shaggy, MD  traMADol Janean Sark) 50  MG tablet Take 1 tablet (50 mg total) by mouth every 6 (six) hours as needed. 11/06/16   Jaclyn Shaggy, MD    Family History Family History  Problem Relation Age of Onset  . Diabetes Mother   . Heart disease Mother   . Hypertension Mother   . Stroke Mother   . Vision loss Mother   . Heart disease Father   . Hypertension Father   . Anesthesia problems Sister        post-op N/V    Social History Social History  Substance Use Topics  . Smoking status: Current Every Day Smoker    Packs/day: 0.50    Years: 37.00    Types: Cigarettes  . Smokeless tobacco: Never Used  . Alcohol use No     Comment: 08/20/2016 "stopped ~ 1 month ago"     Allergies   Ativan [lorazepam]   Review of Systems Review of Systems  All other systems reviewed and are negative.    Physical Exam Updated Vital Signs BP (!) 114/98   Pulse 87   Temp 98 F (36.7 C) (Oral)   Resp 13   Wt 52.2 kg  (115 lb)   SpO2 100%   BMI 18.01 kg/m   Physical Exam  Constitutional: He is oriented to person, place, and time. He appears well-developed and well-nourished. No distress.  HENT:  Head: Normocephalic and atraumatic.  Right Ear: External ear normal.  Left Ear: External ear normal.  Eyes: Conjunctivae and EOM are normal. Pupils are equal, round, and reactive to light.  Neck: Normal range of motion and phonation normal. Neck supple.  Cardiovascular: Normal rate, regular rhythm and normal heart sounds.   Pulmonary/Chest: Effort normal and breath sounds normal. He exhibits no bony tenderness.  Abdominal: Soft. There is no tenderness.  Musculoskeletal: Normal range of motion.  Right medial ankle, with tenderness redness and induration, in the posterior tibial region, with surrounding redness consistent with cellulitis.  No streaking above the ankle.  No popliteal tenderness or mass.  Neurological: He is alert and oriented to person, place, and time. No cranial nerve deficit or sensory deficit. He exhibits normal muscle tone. Coordination normal.  Skin: Skin is warm, dry and intact.  Psychiatric: He has a normal mood and affect. His behavior is normal. Judgment and thought content normal.  Nursing note and vitals reviewed.    ED Treatments / Results  Labs (all labs ordered are listed, but only abnormal results are displayed) Labs Reviewed  BASIC METABOLIC PANEL - Abnormal; Notable for the following:       Result Value   Sodium 133 (*)    Chloride 95 (*)    Glucose, Bld 186 (*)    Creatinine, Ser 1.39 (*)    GFR calc non Af Amer 55 (*)    All other components within normal limits  CBC - Abnormal; Notable for the following:    MCV 76.6 (*)    MCH 25.4 (*)    All other components within normal limits  URINALYSIS, ROUTINE W REFLEX MICROSCOPIC - Abnormal; Notable for the following:    APPearance HAZY (*)    Glucose, UA >=500 (*)    Hgb urine dipstick SMALL (*)    Protein, ur 30 (*)     Leukocytes, UA LARGE (*)    Bacteria, UA RARE (*)    Squamous Epithelial / LPF 0-5 (*)    All other components within normal limits  CBG MONITORING, ED - Abnormal; Notable for the  following:    Glucose-Capillary 430 (*)    All other components within normal limits    EKG  EKG Interpretation None       Radiology No results found.  Procedures Procedures (including critical care time)  Medications Ordered in ED Medications  HYDROmorphone (DILAUDID) injection 1 mg (not administered)  ketorolac (TORADOL) 30 MG/ML injection 30 mg (30 mg Intravenous Given 12/07/16 1733)  ketamine 100 mg in normal saline 10 mL (10mg /mL) syringe (7.8 mg Intravenous Given 12/07/16 1743)  sodium chloride 0.9 % bolus 1,000 mL (1,000 mLs Intravenous New Bag/Given 12/07/16 1735)  cefTRIAXone (ROCEPHIN) 1 g in dextrose 5 % 50 mL IVPB (1 g Intravenous New Bag/Given 12/07/16 1756)     Initial Impression / Assessment and Plan / ED Course  I have reviewed the triage vital signs and the nursing notes.  Pertinent labs & imaging results that were available during my care of the patient were reviewed by me and considered in my medical decision making (see chart for details).      Patient Vitals for the past 24 hrs:  BP Temp Temp src Pulse Resp SpO2 Weight  12/07/16 1755 (!) 114/98 - - 87 13 100 % -  12/07/16 1745 104/76 - - 87 16 100 % -  12/07/16 1730 106/69 - - 88 13 100 % -  12/07/16 1715 111/79 - - 88 15 100 % -  12/07/16 1506 - - - - - - 52.2 kg (115 lb)  12/07/16 1505 (!) 133/92 98 F (36.7 C) Oral 100 19 99 % -  12/07/16 1152 99/69 97.7 F (36.5 C) Oral 90 18 97 % -  12/07/16 1134 - - - - - 98 % -    6:54 PM Reevaluation with update and discussion. After initial assessment and treatment, an updated evaluation reveals he feels somewhat better but the pain has gotten worse again, after transiently improving with the initial treatment.  Single dose Dilaudid ordered.  Findings discussed with the  patient and all questions answered. Raymond Winters    Final Clinical Impressions(s) / ED Diagnoses   Final diagnoses:  Cellulitis of right lower extremity   Evaluation consistent with cellulitis secondary to attempt at vein injection for narcotic administration.  Doubt sepsis, PE or pneumonia.  Nursing Notes Reviewed/ Care Coordinated Applicable Imaging Reviewed Interpretation of Laboratory Data incorporated into ED treatment  The patient appears reasonably screened and/or stabilized for discharge and I doubt any other medical condition or other [facility] requiring further screening, evaluation, or treatment in the ED at this time prior to discharge.  Plan: Home Medications-continue usual medications; Home Treatments-rest, elevation, heat therapy; return here if the recommended treatment, does not improve the symptoms; Recommended follow up-PCP 1 week and return here as needed.   New Prescriptions New Prescriptions   AMOXICILLIN-CLAVULANATE (AUGMENTIN) 875-125 MG TABLET    Take 1 tablet by mouth 2 (two) times daily. One po bid x 7 days   OXYCODONE-ACETAMINOPHEN (PERCOCET) 5-325 MG TABLET    Take 1 tablet by mouth every 4 (four) hours as needed.     Mancel Bale, MD 12/07/16 [phone]

## 2016-12-08 ENCOUNTER — Ambulatory Visit: Payer: Medicare Other | Attending: Family Medicine | Admitting: Family Medicine

## 2016-12-08 ENCOUNTER — Ambulatory Visit (HOSPITAL_COMMUNITY)
Admission: RE | Admit: 2016-12-08 | Discharge: 2016-12-08 | Disposition: A | Discharge status: Home / Self Care | Payer: Medicare Other | Source: Ambulatory Visit | Attending: Family Medicine | Admitting: Family Medicine

## 2016-12-08 ENCOUNTER — Telehealth: Payer: Self-pay

## 2016-12-08 VITALS — BP 92/69 | HR 110 | Temp 97.9°F | Resp 18 | Ht 68.0 in | Wt 102.4 lb

## 2016-12-08 DIAGNOSIS — Z9114 Patient's other noncompliance with medication regimen: Secondary | ICD-10-CM | POA: Diagnosis not present

## 2016-12-08 DIAGNOSIS — B1920 Unspecified viral hepatitis C without hepatic coma: Secondary | ICD-10-CM | POA: Insufficient documentation

## 2016-12-08 DIAGNOSIS — L03115 Cellulitis of right lower limb: Secondary | ICD-10-CM | POA: Insufficient documentation

## 2016-12-08 DIAGNOSIS — Z7982 Long term (current) use of aspirin: Secondary | ICD-10-CM | POA: Diagnosis not present

## 2016-12-08 DIAGNOSIS — E1140 Type 2 diabetes mellitus with diabetic neuropathy, unspecified: Secondary | ICD-10-CM | POA: Diagnosis not present

## 2016-12-08 DIAGNOSIS — M009 Pyogenic arthritis, unspecified: Secondary | ICD-10-CM | POA: Diagnosis not present

## 2016-12-08 DIAGNOSIS — F1110 Opioid abuse, uncomplicated: Secondary | ICD-10-CM | POA: Diagnosis not present

## 2016-12-08 DIAGNOSIS — K860 Alcohol-induced chronic pancreatitis: Secondary | ICD-10-CM | POA: Insufficient documentation

## 2016-12-08 DIAGNOSIS — M7989 Other specified soft tissue disorders: Secondary | ICD-10-CM | POA: Diagnosis not present

## 2016-12-08 DIAGNOSIS — M25571 Pain in right ankle and joints of right foot: Secondary | ICD-10-CM | POA: Diagnosis not present

## 2016-12-08 DIAGNOSIS — E118 Type 2 diabetes mellitus with unspecified complications: Secondary | ICD-10-CM

## 2016-12-08 DIAGNOSIS — F329 Major depressive disorder, single episode, unspecified: Secondary | ICD-10-CM | POA: Diagnosis not present

## 2016-12-08 DIAGNOSIS — E1165 Type 2 diabetes mellitus with hyperglycemia: Secondary | ICD-10-CM

## 2016-12-08 DIAGNOSIS — Z794 Long term (current) use of insulin: Secondary | ICD-10-CM | POA: Insufficient documentation

## 2016-12-08 DIAGNOSIS — M726 Necrotizing fasciitis: Secondary | ICD-10-CM | POA: Insufficient documentation

## 2016-12-08 DIAGNOSIS — IMO0002 Reserved for concepts with insufficient information to code with codable children: Secondary | ICD-10-CM

## 2016-12-08 DIAGNOSIS — F1910 Other psychoactive substance abuse, uncomplicated: Secondary | ICD-10-CM | POA: Insufficient documentation

## 2016-12-08 DIAGNOSIS — E7800 Pure hypercholesterolemia, unspecified: Secondary | ICD-10-CM | POA: Insufficient documentation

## 2016-12-08 LAB — POCT GLYCOSYLATED HEMOGLOBIN (HGB A1C): Hemoglobin A1C: 13.2

## 2016-12-08 LAB — GLUCOSE, POCT (MANUAL RESULT ENTRY): POC Glucose: 330 mg/dl — AB (ref 70–99)

## 2016-12-08 MED ORDER — INSULIN PEN NEEDLE 31G X 5 MM MISC: 5 refills | Status: DC

## 2016-12-08 MED ORDER — MELOXICAM 7.5 MG PO TABS: 7.5000 mg | ORAL_TABLET | Freq: Every day | ORAL | 1 refills | Status: DC | PRN

## 2016-12-08 NOTE — Progress Notes (Addendum)
Subjective:  Patient ID: Raymond Winters, male    DOB: 04/30/1961  Age: 56 y.o. MRN: [id number]  CC: Right ankle pain and swelling  HPI Raymond Winters is a 56 year old male with history of type 2 diabetes mellitus (A1c 13.2), diabetic neuropathy, chronic alcoholic pancreatitis, substance abuse, necrotizing fasciitis of the right buttock status post debridement who presents today With right ankle swelling.  He was seen for right ankle cellulitis at the ED yesterday and commenced on Augmentin. He admits to trying to inject IV drugs into his right ankle one week ago and subsequently developed pain and swelling in that ankle. Labs at the ED revealed normal leukocyte of 7.2. No imaging was performed. He denies fevers, myalgias.  His A1c is 13.2 which is up from 10.9 previously. He endorses not taking prescribed dose of Lantus and has been taking 30 units rather than 35.  Past Medical History:  Diagnosis Date  . Alcohol dependence (HCC) 01/05/2016  . Alcoholic liver disease, unspecified (HCC) 01/05/2016  . Arthritis    "lower extremeties, hands, shoulders, back" (08/20/2016)  . Chronic alcoholic pancreatitis (HCC) 01/05/2016  . Depression   . Family history of adverse reaction to anesthesia    pt's sister has hx. of post-op N/V  . Hepatitis C    "never treated" (08/20/2016)  . Heroin abuse   . High cholesterol   . Hypoalbuminemia due to protein-calorie malnutrition (HCC) 01/05/2016  . Insulin dependent diabetes mellitus (HCC)   . Lactic acid increased, CHRONIC 01/05/2016   Secondary to Liver Disease, Alcoholism, and Diabetes  . 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 Shoulder wound 01/2016   left    Past Surgical History:  Procedure Laterality Date  . APPLICATION OF WOUND VAC Left 12/24/2015   Procedure: APPLICATION OF WOUND VAC; LEFT SHOULDER;  Surgeon: Tarry Kos, MD;  Location: [facility];  Service: Orthopedics;  Laterality: Left;  . APPLICATION OF WOUND VAC Left 01/29/2016   Procedure: LEFT SHOULDER SPLIT THICKNESS SKIN GRAFT, WOUND VAC;  Surgeon: Tarry Kos, MD;  Location: [facility];  Service: Orthopedics;  Laterality: Left;  . I&D EXTREMITY Right 03/20/2013   Procedure: IRRIGATION AND DEBRIDEMENT EXTREMITY;  Surgeon: Marlowe Shores, MD;  Location: [facility];  Service: Orthopedics;  Laterality: Right;  . I&D EXTREMITY Left 12/24/2015   Procedure:  DEBRIDEMENT OF MUSCLE, SUBCUTANEOUS TISSUE LEFT SHOULDER;  Surgeon: Tarry Kos, MD;  Location: [facility];  Service: Orthopedics;  Laterality: Left;  . INCISION AND DRAINAGE ABSCESS Right 08/19/2016   Procedure: DEBRIDEMENT RIGHT BUTTOCK  ABSCESS;  Surgeon: Manus Rudd, MD;  Location: [facility] OR;  Service: General;  Laterality: Right;  . IRRIGATION AND DEBRIDEMENT ABSCESS Right 08/19/2016   buttocks  . IRRIGATION AND DEBRIDEMENT SHOULDER Left 12/27/2015   Procedure: IRRIGATION AND DEBRIDEMENT LEFT SHOULDER;  Surgeon: Tarry Kos, MD;  Location: [facility];  Service: Orthopedics;  Laterality: Left;  . IRRIGATION AND DEBRIDEMENT SHOULDER Left 12/30/2015   Procedure: IRRIGATION AND DEBRIDEMENT LEFT SHOULDER;  Surgeon: Tarry Kos, MD;  Location: [facility];  Service: Orthopedics;  Laterality: Left;  . SKIN SPLIT GRAFT Left 01/29/2016   Procedure: SKIN GRAFT SPLIT THICKNESS,;  Surgeon: Tarry Kos, MD;  Location: [facility];  Service: Orthopedics;  Laterality: Left;    Allergies  Allergen Reactions  . Ativan [Lorazepam] Other (See Comments)    HALLUCINATIONS     Outpatient Medications Prior to Visit  Medication Sig Dispense Refill  .  amoxicillin-clavulanate (AUGMENTIN) 875-125 MG tablet Take 1 tablet by mouth 2 (two) times daily. One po bid x 7 days 14 tablet 0  . aspirin EC 81 MG tablet Take 1 tablet (81 mg total) by mouth daily. 30 tablet 5  . atorvastatin (LIPITOR) 40 MG tablet Take 1 tablet (40 mg total) by mouth daily. 30 tablet 5  . Blood Glucose Monitoring Suppl (ACCU-CHEK AVIVA) device Use as  instructed 3 times before meals 1 each 0  . gabapentin (NEURONTIN) 300 MG capsule TAKE 1 CAPSULE BY MOUTH AT BEDTIME FOR DIABETIC NERVE PAIN 30 capsule 5  . glucose blood (ACCU-CHEK AVIVA) test strip Use as instructed 3 times before meals 100 each 12  . insulin aspart (NOVOLOG FLEXPEN) 100 UNIT/ML FlexPen 0-12 units before meals as per sliding scale as directed 15 mL 2  . Insulin Glargine (LANTUS SOLOSTAR) 100 UNIT/ML Solostar Pen Inject 35 Units into the skin daily at 10 pm. 5 pen 3  . Lancet Devices (ACCU-CHEK SOFTCLIX) lancets Use as instructed 3 times before meals 1 each 5  . mirtazapine (REMERON) 30 MG tablet Take 1 tablet (30 mg total) by mouth at bedtime. 30 tablet 3  . Multiple Vitamins-Minerals (MULTIVITAMIN WITH MINERALS) tablet Take 1 tablet by mouth daily.    . oxyCODONE-Marland Kitchenacetaminophen (PERCOCET) 5-325 MG tablet Take 1 tablet by mouth every 4 (four) hours as needed. 10 tablet 0  . Pancrelipase, Lip-Prot-Amyl, (ZENPEP) 25000 units CPEP Take 25,000 Units by mouth 3 (three) times daily before meals. 90 capsule 3  . traMADol (ULTRAM) 50 MG tablet Take 1 tablet (50 mg total) by mouth every 6 (six) hours as needed. 30 tablet 0  . cephALEXin (KEFLEX) 500 MG capsule Take 1 capsule (500 mg total) by mouth 4 (four) times daily. 20 capsule 0  . Insulin Pen Needle 31G X 5 MM MISC Use 3 times daily before meals and at bedtime 120 each 5  . meloxicam (MOBIC) 7.5 MG tablet Take 1 tablet (7.5 mg total) by mouth daily as needed for pain. 30 tablet 1  . oxyCODONE (OXY IR/ROXICODONE) 5 MG immediate release tablet Take 1 tablet (5 mg total) by mouth every 6 (six) hours as needed for moderate pain (before dressing changes). (Patient not taking: Reported on 10/12/2016) 15 tablet 0   No facility-administered medications prior to visit.     ROS Review of Systems Constitutional: Negative for activity change and appetite change.  HENT: Negative for sinus pressure and sore throat.   Eyes: Negative for visual  disturbance.  Respiratory: Negative for cough, chest tightness and shortness of breath.   Cardiovascular: Negative for chest pain and positive for leg swelling.  Gastrointestinal: Negative for abdominal distention, abdominal pain, constipation and diarrhea.  Endocrine: Negative.   Genitourinary: Negative for dysuria.  Musculoskeletal: see hpi  Skin: Positive for wound. Negative for rash.  Allergic/Immunologic: Negative.   Neurological: Negative for weakness, light-headedness and numbness.  Psychiatric/Behavioral: Negative for dysphoric mood and suicidal ideas.   Objective:  BP 92/69 (BP Location: Left Arm, Patient Position: Sitting, Cuff Size: Normal)   Pulse (!) 110   Temp 97.9 F (36.6 C) (Oral)   Resp 18   Ht 5\' 8"  (1.727 m)   Wt 102 lb 6.4 oz (46.4 kg)   SpO2 98%   BMI 15.57 kg/m   BP/Weight 12/08/2016 12/07/2016 10/12/2016  Systolic BP 92 95 101  Diastolic BP 69 66 71  Wt. (Lbs) 102.4 115 111.4  BMI 15.57 18.01 17.45  Physical Exam  Constitutional: He is oriented to person, place, and time. He appears well-developed and well-nourished.  Cardiovascular: Normal heart sounds and intact distal pulses.  Tachycardia present.   No murmur heard. Pulmonary/Chest: Effort normal and breath sounds normal. He has no wheezes. He has no rales. He exhibits no tenderness.  Abdominal: Soft. Bowel sounds are normal. He exhibits no distension and no mass. There is no tenderness.  Musculoskeletal: Normal range of motion. He exhibits edema (Edema of right ankle with erythema of the medial aspect) and tenderness (right ankle tenderness).  Neurological: He is alert and oriented to person, place, and time.   CBC    Component Value Date/Time   WBC 7.2 12/07/2016 1424   RBC 5.48 12/07/2016 1424   HGB 13.9 12/07/2016 1424   HCT 42.0 12/07/2016 1424   PLT 274 12/07/2016 1424   MCV 76.6 (L) 12/07/2016 1424   MCH 25.4 (L) 12/07/2016 1424   MCHC 33.1 12/07/2016 1424   RDW 14.0 12/07/2016  1424   LYMPHSABS 1.6 06/22/2016 1543   MONOABS 0.4 06/22/2016 1543   EOSABS 0.1 06/22/2016 1543   BASOSABS 0.0 06/22/2016 1543    CMP Latest Ref Rng & Units 12/07/2016 08/22/2016 08/22/2016  Glucose 65 - 99 mg/dL 829(F) 621(H) -  BUN 6 - 20 mg/dL 16 6 -  Creatinine 0.86 - 1.24 mg/dL 5.78(I) 6.96 2.95  Sodium 135 - 145 mmol/L 133(L) 138 -  Potassium 3.5 - 5.1 mmol/L 4.2 3.8 -  Chloride 101 - 111 mmol/L 95(L) 105 -  CO2 22 - 32 mmol/L 25 26 -  Calcium 8.9 - 10.3 mg/dL 9.6 8.1(L) -  Total Protein 6.5 - 8.1 g/dL - 5.1(L) -  Total Bilirubin 0.3 - 1.2 mg/dL - 0.5 -  Alkaline Phos 38 - 126 U/L - 45 -  AST 15 - 41 U/L - 32 -  ALT 17 - 63 U/L - 13(L) -      Assessment & Plan:   1. Uncontrolled type 2 diabetes mellitus with complication, with long-term current use of insulin (HCC) Uncontrolled with A1c of 13.2 due to noncompliance I have stressed the importance of complying with the prescribed regimen We'll review blood sugar log at next visit - Glucose (CBG) - HgB A1c  2. Septic arthritis of right ankle, due to unspecified organism [facility]) Managed as cellulitis by ED Will need to exclude septic arthritis-referred to the ED for x-ray He is a high risk patient given underlying diabetes mellitus and he needs to present to the ED and the patient symptoms worsen or he develops a fever to prevent sepsis. - meloxicam (MOBIC) 7.5 MG tablet; Take 1 tablet (7.5 mg total) by mouth daily as needed for pain.  Dispense: 30 tablet; Refill: 1 - DG Ankle Complete Right; Future  3. Heroin abuse I have stressed with him the importance of abstaining from IV drug use Will not prescribe control medications for pain  4. Non compliance w medication regimen Stressed implications of noncompliance and competitions of diabetes.   Meds ordered this encounter  Medications  . Insulin Pen Needle 31G X 5 MM MISC    Sig: Use 3 times daily before meals and at bedtime    Dispense:  120 each    Refill:  5  .  meloxicam (MOBIC) 7.5 MG tablet    Sig: Take 1 tablet (7.5 mg total) by mouth daily as needed for pain.    Dispense:  30 tablet    Refill:  1  Follow-up: Return in about 3 days (around 12/11/2016), or if symptoms worsen or fail to improve, for Follow-up of right ankle swelling.   This note has been created with Education officer, environmental. Any transcriptional errors are unintentional.     Jaclyn Shaggy MD

## 2016-12-08 NOTE — Telephone Encounter (Signed)
-----   Message from Jaclyn ShaggyEnobong Amao, MD sent at 12/08/2016  4:51 PM EDT ----- X-ray does not show any fluid in the joints, no fracture. I will see him at his follow-up appointment later this week.

## 2016-12-08 NOTE — Patient Instructions (Signed)
Diabetes Mellitus and Food It is important for you to manage your blood sugar (glucose) level. Your blood glucose level can be greatly affected by what you eat. Eating healthier foods in the appropriate amounts throughout the day at about the same time each day will help you control your blood glucose level. It can also help slow or prevent worsening of your diabetes mellitus. Healthy eating may even help you improve the level of your blood pressure and reach or maintain a healthy weight. General recommendations for healthful eating and cooking habits include:  Eating meals and snacks regularly. Avoid going long periods of time without eating to lose weight.  Eating a diet that consists mainly of plant-based foods, such as fruits, vegetables, nuts, legumes, and whole grains.  Using low-heat cooking methods, such as baking, instead of high-heat cooking methods, such as deep frying.  Work with your dietitian to make sure you understand how to use the Nutrition Facts information on food labels. How can food affect me? Carbohydrates Carbohydrates affect your blood glucose level more than any other type of food. Your dietitian will help you determine how many carbohydrates to eat at each meal and teach you how to count carbohydrates. Counting carbohydrates is important to keep your blood glucose at a healthy level, especially if you are using insulin or taking certain medicines for diabetes mellitus. Alcohol Alcohol can cause sudden decreases in blood glucose (hypoglycemia), especially if you use insulin or take certain medicines for diabetes mellitus. Hypoglycemia can be a life-threatening condition. Symptoms of hypoglycemia (sleepiness, dizziness, and disorientation) are similar to symptoms of having too much alcohol. If your health care provider has given you approval to drink alcohol, do so in moderation and use the following guidelines:  Women should not have more than one drink per day, and men  should not have more than two drinks per day. One drink is equal to: ? 12 oz of beer. ? 5 oz of wine. ? 1 oz of hard liquor.  Do not drink on an empty stomach.  Keep yourself hydrated. Have water, diet soda, or unsweetened iced tea.  Regular soda, juice, and other mixers might contain a lot of carbohydrates and should be counted.  What foods are not recommended? As you make food choices, it is important to remember that all foods are not the same. Some foods have fewer nutrients per serving than other foods, even though they might have the same number of calories or carbohydrates. It is difficult to get your body what it needs when you eat foods with fewer nutrients. Examples of foods that you should avoid that are high in calories and carbohydrates but low in nutrients include:  Trans fats (most processed foods list trans fats on the Nutrition Facts label).  Regular soda.  Juice.  Candy.  Sweets, such as cake, pie, doughnuts, and cookies.  Fried foods.  What foods can I eat? Eat nutrient-rich foods, which will nourish your body and keep you healthy. The food you should eat also will depend on several factors, including:  The calories you need.  The medicines you take.  Your weight.  Your blood glucose level.  Your blood pressure level.  Your cholesterol level.  You should eat a variety of foods, including:  Protein. ? Lean cuts of meat. ? Proteins low in saturated fats, such as fish, egg whites, and beans. Avoid processed meats.  Fruits and vegetables. ? Fruits and vegetables that may help control blood glucose levels, such as apples,   mangoes, and yams.  Dairy products. ? Choose fat-free or low-fat dairy products, such as milk, yogurt, and cheese.  Grains, bread, pasta, and rice. ? Choose whole grain products, such as multigrain bread, whole oats, and brown rice. These foods may help control blood pressure.  Fats. ? Foods containing healthful fats, such as  nuts, avocado, olive oil, canola oil, and fish.  Does everyone with diabetes mellitus have the same meal plan? Because every person with diabetes mellitus is different, there is not one meal plan that works for everyone. It is very important that you meet with a dietitian who will help you create a meal plan that is just right for you. This information is not intended to replace advice given to you by your health care provider. Make sure you discuss any questions you have with your health care provider. Document Released: 03/05/2005 Document Revised: 11/14/2015 Document Reviewed: 05/05/2013 Elsevier Interactive Patient Education  2017 [employer]  

## 2016-12-08 NOTE — Telephone Encounter (Signed)
CMA call patient regarding x ray results  Patient di not answer CMA [facility] & stated that if have any questions just to call back

## 2016-12-08 NOTE — Progress Notes (Signed)
Patient is here for twisted/swollen  right ankle

## 2016-12-09 ENCOUNTER — Encounter: Payer: Self-pay | Admitting: Family Medicine

## 2016-12-11 ENCOUNTER — Encounter: Payer: Self-pay | Admitting: Family Medicine

## 2016-12-11 ENCOUNTER — Ambulatory Visit: Payer: Medicare Other | Attending: Family Medicine | Admitting: Family Medicine

## 2016-12-11 VITALS — BP 112/71 | HR 88 | Temp 98.2°F | Wt 103.6 lb

## 2016-12-11 DIAGNOSIS — E7800 Pure hypercholesterolemia, unspecified: Secondary | ICD-10-CM | POA: Insufficient documentation

## 2016-12-11 DIAGNOSIS — F1110 Opioid abuse, uncomplicated: Secondary | ICD-10-CM | POA: Insufficient documentation

## 2016-12-11 DIAGNOSIS — M1990 Unspecified osteoarthritis, unspecified site: Secondary | ICD-10-CM | POA: Diagnosis not present

## 2016-12-11 DIAGNOSIS — Z888 Allergy status to other drugs, medicaments and biological substances status: Secondary | ICD-10-CM | POA: Insufficient documentation

## 2016-12-11 DIAGNOSIS — B1920 Unspecified viral hepatitis C without hepatic coma: Secondary | ICD-10-CM | POA: Diagnosis not present

## 2016-12-11 DIAGNOSIS — L03115 Cellulitis of right lower limb: Secondary | ICD-10-CM | POA: Diagnosis not present

## 2016-12-11 DIAGNOSIS — E119 Type 2 diabetes mellitus without complications: Secondary | ICD-10-CM | POA: Diagnosis present

## 2016-12-11 DIAGNOSIS — Z79899 Other long term (current) drug therapy: Secondary | ICD-10-CM | POA: Diagnosis not present

## 2016-12-11 DIAGNOSIS — E1142 Type 2 diabetes mellitus with diabetic polyneuropathy: Secondary | ICD-10-CM | POA: Diagnosis not present

## 2016-12-11 DIAGNOSIS — Z794 Long term (current) use of insulin: Secondary | ICD-10-CM | POA: Diagnosis not present

## 2016-12-11 DIAGNOSIS — Z7982 Long term (current) use of aspirin: Secondary | ICD-10-CM | POA: Diagnosis not present

## 2016-12-11 DIAGNOSIS — E1100 Type 2 diabetes mellitus with hyperosmolarity without nonketotic hyperglycemic-hyperosmolar coma (NKHHC): Secondary | ICD-10-CM | POA: Insufficient documentation

## 2016-12-11 LAB — GLUCOSE, POCT (MANUAL RESULT ENTRY): POC Glucose: 135 mg/dL — AB (ref 70–99)

## 2016-12-11 MED ORDER — BASAGLAR KWIKPEN 100 UNIT/ML ~~LOC~~ SOPN: 35.0000 [IU] | PEN_INJECTOR | Freq: Every day | SUBCUTANEOUS | 3 refills | Status: DC

## 2016-12-11 MED ORDER — GABAPENTIN 300 MG PO CAPS: ORAL_CAPSULE | ORAL | 5 refills | Status: DC

## 2016-12-11 MED FILL — BASAGLAR 100 UNIT/ML KWIKPE: 100 | 34 days supply | Qty: 12 | Fill #0

## 2016-12-11 MED FILL — !LANTUS SOLOSTAR 100UNITS/M: 100 | 10 days supply | Qty: 3 | Fill #2

## 2016-12-11 MED FILL — GABAPENTIN 300 MG CAPSULE: 300 | 30 days supply | Qty: 60 | Fill #0

## 2016-12-11 NOTE — Progress Notes (Signed)
Subjective:  Patient ID: Raymond Winters, male    DOB: 1961/03/24  Age: 56 y.o. MRN: [id number]  CC: Joint Swelling   HPI Raymond Winters is a 56 year old male with history of type 2 diabetes mellitus (A1c 13.2), diabetic neuropathy, chronic alcoholic pancreatitis, substance abuse, necrotizing fasciitis of the right buttock status post debridement who presents for follow-up of right ankle pain and swelling.  He was seen for right ankle cellulitis at the ED and commenced on Augmentin. He admitted to trying to inject IV drugs into his right ankle and subsequently developed pain and swelling in that ankle. Labs at the ED revealed normal leukocyte of 7.2 and repeat CBC in the office came back with a WBC of 7.2  At his last office visit x-ray of his right ankle was ordered which revealed no acute abnormality. He denies fevers, myalgias but continues to complain of pain  Past Medical History:  Diagnosis Date  . Alcohol dependence (HCC) 01/05/2016  . Alcoholic liver disease, unspecified (HCC) 01/05/2016  . Arthritis    "lower extremeties, hands, shoulders, back" (08/20/2016)  . Chronic alcoholic pancreatitis (HCC) 01/05/2016  . Depression   . Family history of adverse reaction to anesthesia    pt's sister has hx. of post-op N/V  . Hepatitis C    "never treated" (08/20/2016)  . Heroin abuse   . High cholesterol   . Hypoalbuminemia due to protein-calorie malnutrition (HCC) 01/05/2016  . Insulin dependent diabetes mellitus (HCC)   . Lactic acid increased, CHRONIC 01/05/2016   Secondary to Liver Disease, Alcoholism, and Diabetes  . 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 Shoulder wound 01/2016   left    Past Surgical History:  Procedure Laterality Date  . APPLICATION OF WOUND VAC Left 12/24/2015   Procedure: APPLICATION OF WOUND VAC; LEFT SHOULDER;  Surgeon: Tarry Kos, MD;  Location: [facility] OR;  Service: Orthopedics;  Laterality: Left;  . APPLICATION OF WOUND VAC Left 01/29/2016   Procedure: LEFT SHOULDER SPLIT THICKNESS SKIN GRAFT, WOUND VAC;  Surgeon: Tarry Kos, MD;  Location: [facility];  Service: Orthopedics;  Laterality: Left;  . I&D EXTREMITY Right 03/20/2013   Procedure: IRRIGATION AND DEBRIDEMENT EXTREMITY;  Surgeon: Marlowe Shores, MD;  Location: [facility];  Service: Orthopedics;  Laterality: Right;  . I&D EXTREMITY Left 12/24/2015   Procedure:  DEBRIDEMENT OF MUSCLE, SUBCUTANEOUS TISSUE LEFT SHOULDER;  Surgeon: Tarry Kos, MD;  Location: [facility];  Service: Orthopedics;  Laterality: Left;  . INCISION AND DRAINAGE ABSCESS Right 08/19/2016   Procedure: DEBRIDEMENT RIGHT BUTTOCK  ABSCESS;  Surgeon: Manus Rudd, MD;  Location: [facility] OR;  Service: General;  Laterality: Right;  . IRRIGATION AND DEBRIDEMENT ABSCESS Right 08/19/2016   buttocks  . IRRIGATION AND DEBRIDEMENT SHOULDER Left 12/27/2015   Procedure: IRRIGATION AND DEBRIDEMENT LEFT SHOULDER;  Surgeon: Tarry Kos, MD;  Location: [facility];  Service: Orthopedics;  Laterality: Left;  . IRRIGATION AND DEBRIDEMENT SHOULDER Left 12/30/2015   Procedure: IRRIGATION AND DEBRIDEMENT LEFT SHOULDER;  Surgeon: Tarry Kos, MD;  Location: [facility];  Service: Orthopedics;  Laterality: Left;  . SKIN SPLIT GRAFT Left 01/29/2016   Procedure: SKIN GRAFT SPLIT THICKNESS,;  Surgeon: Tarry Kos, MD;  Location: [facility];  Service: Orthopedics;  Laterality: Left;    Allergies  Allergen Reactions  . Ativan [Lorazepam] Other (See Comments)    HALLUCINATIONS     Outpatient Medications Prior to Visit  Medication Sig Dispense  Refill  . amoxicillin-clavulanate (AUGMENTIN) 875-125 MG tablet Take 1 tablet by mouth 2 (two) times daily. One po bid x 7 days 14 tablet 0  . aspirin EC 81 MG tablet Take 1 tablet (81 mg total) by mouth daily. 30 tablet 5  . atorvastatin (LIPITOR) 40 MG tablet Take 1 tablet (40 mg total) by mouth daily. 30 tablet 5  . Blood Glucose Monitoring Suppl (ACCU-CHEK AVIVA) device Use as  instructed 3 times before meals 1 each 0  . glucose blood (ACCU-CHEK AVIVA) test strip Use as instructed 3 times before meals 100 each 12  . insulin aspart (NOVOLOG FLEXPEN) 100 UNIT/ML FlexPen 0-12 units before meals as per sliding scale as directed 15 mL 2  . Insulin Pen Needle 31G X 5 MM MISC Use 3 times daily before meals and at bedtime 120 each 5  . Lancet Devices (ACCU-CHEK SOFTCLIX) lancets Use as instructed 3 times before meals 1 each 5  . meloxicam (MOBIC) 7.5 MG tablet Take 1 tablet (7.5 mg total) by mouth daily as needed for pain. 30 tablet 1  . mirtazapine (REMERON) 30 MG tablet Take 1 tablet (30 mg total) by mouth at bedtime. 30 tablet 3  . Multiple Vitamins-Minerals (MULTIVITAMIN WITH MINERALS) tablet Take 1 tablet by mouth daily.    . Pancrelipase, Lip-Prot-Amyl, (ZENPEP) 25000 units CPEP Take 25,000 Units by mouth 3 (three) times daily before meals. 90 capsule 3  . gabapentin (NEURONTIN) 300 MG capsule TAKE 1 CAPSULE BY MOUTH AT BEDTIME FOR DIABETIC NERVE PAIN 30 capsule 5  . Insulin Glargine (LANTUS SOLOSTAR) 100 UNIT/ML Solostar Pen Inject 35 Units into the skin daily at 10 pm. 5 pen 3  . oxyCODONE-acetaminophen (PERCOCET) 5-325 MG tablet Take 1 tablet by mouth every 4 (four) hours as needed. (Patient not taking: Reported on 12/11/2016) 10 tablet 0  . traMADol (ULTRAM) 50 MG tablet Take 1 tablet (50 mg total) by mouth every 6 (six) hours as needed. (Patient not taking: Reported on 12/11/2016) 30 tablet 0   No facility-administered medications prior to visit.     ROS Review of Systems Constitutional: Negative for activity change and appetite change.  HENT: Negative for sinus pressure and sore throat.   Eyes: Negative for visual disturbance.  Respiratory: Negative for cough, chest tightness and shortness of breath.   Cardiovascular: Negative for chest pain and positive for leg swelling.  Gastrointestinal: Negative for abdominal distention, abdominal pain, constipation and  diarrhea.  Endocrine: Negative.   Genitourinary: Negative for dysuria.  Musculoskeletal: see hpi  Skin: Positive for wound. Negative for rash.  Allergic/Immunologic: Negative.   Neurological: Negative for weakness, light-headedness and numbness.  Psychiatric/Behavioral: Negative for dysphoric mood and suicidal ideas.   Objective:  BP 112/71   Pulse 88   Temp 98.2 F (36.8 C) (Oral)   Wt 103 lb 9.6 oz (47 kg)   SpO2 96%   BMI 15.75 kg/m   BP/Weight 12/11/2016 12/08/2016 12/07/2016  Systolic BP 112 92 95  Diastolic BP 71 69 66  Wt. (Lbs) 103.6 102.4 115  BMI 15.75 15.57 18.01      Physical Exam Constitutional: He is oriented to person, place, and time. He appears well-developed and well-nourished.  Cardiovascular: Normal heart sounds and intact distal pulses.  Tachycardia present.   No murmur heard. Pulmonary/Chest: Effort normal and breath sounds normal. He has no wheezes. He has no rales. He exhibits no tenderness.  Abdominal: Soft. Bowel sounds are normal. He exhibits no distension and no mass. There is  no tenderness.  Musculoskeletal: Normal range of motion. He exhibits edema (Edema of right ankle with erythema of the medial aspect) and tenderness (right ankle tenderness).  Neurological: He is alert and oriented to person, place, and time.     Lab Results  Component Value Date   HGBA1C 13.2 12/08/2016    CBC    Component Value Date/Time   WBC 7.2 12/07/2016 1424   RBC 5.48 12/07/2016 1424   HGB 13.9 12/07/2016 1424   HCT 42.0 12/07/2016 1424   PLT 274 12/07/2016 1424   MCV 76.6 (L) 12/07/2016 1424   MCH 25.4 (L) 12/07/2016 1424   MCHC 33.1 12/07/2016 1424   RDW 14.0 12/07/2016 1424   LYMPHSABS 1.6 06/22/2016 1543   MONOABS 0.4 06/22/2016 1543   EOSABS 0.1 06/22/2016 1543   BASOSABS 0.0 06/22/2016 1543    Assessment & Plan:   1. Type 2 diabetes mellitus with hyperosmolarity without coma, without long-term current use of insulin (HCC) Uncontrolled with  A1c of 13.2 Blood sugars revealed improvement - POCT glucose (manual entry) - Insulin Glargine (BASAGLAR KWIKPEN) 100 UNIT/ML SOPN; Inject 0.35 mLs (35 Units total) into the skin at bedtime.  Dispense: 5 pen; Refill: 3  2. Diabetic polyneuropathy associated with type 2 diabetes mellitus (HCC) Increased dose of gabapentin hopefully this will help with right ankle pain - gabapentin (NEURONTIN) 300 MG capsule; TAKE 2 CAPSULE BY MOUTH AT BEDTIME FOR DIABETIC NERVE PAIN  Dispense: 120 capsule; Refill: 5  3. Heroin abuse Discussed cessation  4. Cellulitis of right ankle WBC stable Continue Augmentin Discussed return precautions and the need to present to the ED if symptoms worsen or he develops a fever. I have informed him he is a high risk patient due to drug abuse and I will not place him on any controlled substance for pain   Meds ordered this encounter  Medications  . gabapentin (NEURONTIN) 300 MG capsule    Sig: TAKE 2 CAPSULE BY MOUTH AT BEDTIME FOR DIABETIC NERVE PAIN    Dispense:  120 capsule    Refill:  5    Decrease previous dose  . Insulin Glargine (BASAGLAR KWIKPEN) 100 UNIT/ML SOPN    Sig: Inject 0.35 mLs (35 Units total) into the skin at bedtime.    Dispense:  5 pen    Refill:  3    Follow-up: Return in about 1 week (around 12/18/2016) for for follow up of right ankle pain.   Jaclyn ShaggyEnobong Amao MD

## 2016-12-14 ENCOUNTER — Encounter (HOSPITAL_COMMUNITY): Payer: Self-pay

## 2016-12-14 ENCOUNTER — Emergency Department (HOSPITAL_COMMUNITY): Payer: Medicare Other

## 2016-12-14 ENCOUNTER — Inpatient Hospital Stay (HOSPITAL_COMMUNITY)
Admission: EM | Admit: 2016-12-14 | Discharge: 2016-12-23 | DRG: 854 | Disposition: A | Discharge status: Home / Self Care | Payer: Medicare Other | Attending: Internal Medicine | Admitting: Internal Medicine

## 2016-12-14 DIAGNOSIS — M25579 Pain in unspecified ankle and joints of unspecified foot: Secondary | ICD-10-CM

## 2016-12-14 DIAGNOSIS — E639 Nutritional deficiency, unspecified: Secondary | ICD-10-CM | POA: Diagnosis present

## 2016-12-14 DIAGNOSIS — F1110 Opioid abuse, uncomplicated: Secondary | ICD-10-CM | POA: Diagnosis not present

## 2016-12-14 DIAGNOSIS — F1010 Alcohol abuse, uncomplicated: Secondary | ICD-10-CM | POA: Diagnosis present

## 2016-12-14 DIAGNOSIS — K5900 Constipation, unspecified: Secondary | ICD-10-CM | POA: Diagnosis present

## 2016-12-14 DIAGNOSIS — F419 Anxiety disorder, unspecified: Secondary | ICD-10-CM

## 2016-12-14 DIAGNOSIS — E1122 Type 2 diabetes mellitus with diabetic chronic kidney disease: Secondary | ICD-10-CM | POA: Diagnosis present

## 2016-12-14 DIAGNOSIS — L0231 Cutaneous abscess of buttock: Secondary | ICD-10-CM | POA: Diagnosis not present

## 2016-12-14 DIAGNOSIS — L03119 Cellulitis of unspecified part of limb: Secondary | ICD-10-CM | POA: Diagnosis present

## 2016-12-14 DIAGNOSIS — E1142 Type 2 diabetes mellitus with diabetic polyneuropathy: Secondary | ICD-10-CM | POA: Diagnosis present

## 2016-12-14 DIAGNOSIS — F1020 Alcohol dependence, uncomplicated: Secondary | ICD-10-CM | POA: Diagnosis present

## 2016-12-14 DIAGNOSIS — E118 Type 2 diabetes mellitus with unspecified complications: Secondary | ICD-10-CM | POA: Diagnosis not present

## 2016-12-14 DIAGNOSIS — Z79899 Other long term (current) drug therapy: Secondary | ICD-10-CM

## 2016-12-14 DIAGNOSIS — M25571 Pain in right ankle and joints of right foot: Secondary | ICD-10-CM

## 2016-12-14 DIAGNOSIS — Z888 Allergy status to other drugs, medicaments and biological substances status: Secondary | ICD-10-CM

## 2016-12-14 DIAGNOSIS — Z794 Long term (current) use of insulin: Secondary | ICD-10-CM | POA: Diagnosis not present

## 2016-12-14 DIAGNOSIS — Z720 Tobacco use: Secondary | ICD-10-CM | POA: Diagnosis not present

## 2016-12-14 DIAGNOSIS — E1165 Type 2 diabetes mellitus with hyperglycemia: Secondary | ICD-10-CM | POA: Diagnosis present

## 2016-12-14 DIAGNOSIS — E876 Hypokalemia: Secondary | ICD-10-CM | POA: Diagnosis not present

## 2016-12-14 DIAGNOSIS — E872 Acidosis, unspecified: Secondary | ICD-10-CM | POA: Diagnosis present

## 2016-12-14 DIAGNOSIS — E11649 Type 2 diabetes mellitus with hypoglycemia without coma: Secondary | ICD-10-CM | POA: Diagnosis present

## 2016-12-14 DIAGNOSIS — A419 Sepsis, unspecified organism: Secondary | ICD-10-CM | POA: Diagnosis not present

## 2016-12-14 DIAGNOSIS — R739 Hyperglycemia, unspecified: Secondary | ICD-10-CM | POA: Diagnosis present

## 2016-12-14 DIAGNOSIS — E875 Hyperkalemia: Secondary | ICD-10-CM | POA: Diagnosis present

## 2016-12-14 DIAGNOSIS — F329 Major depressive disorder, single episode, unspecified: Secondary | ICD-10-CM | POA: Diagnosis present

## 2016-12-14 DIAGNOSIS — M726 Necrotizing fasciitis: Secondary | ICD-10-CM

## 2016-12-14 DIAGNOSIS — F32A Depression, unspecified: Secondary | ICD-10-CM | POA: Diagnosis present

## 2016-12-14 DIAGNOSIS — N179 Acute kidney failure, unspecified: Secondary | ICD-10-CM | POA: Diagnosis present

## 2016-12-14 DIAGNOSIS — I959 Hypotension, unspecified: Secondary | ICD-10-CM | POA: Diagnosis present

## 2016-12-14 DIAGNOSIS — L03115 Cellulitis of right lower limb: Secondary | ICD-10-CM | POA: Diagnosis present

## 2016-12-14 DIAGNOSIS — M869 Osteomyelitis, unspecified: Secondary | ICD-10-CM | POA: Diagnosis present

## 2016-12-14 DIAGNOSIS — E1169 Type 2 diabetes mellitus with other specified complication: Secondary | ICD-10-CM | POA: Diagnosis present

## 2016-12-14 DIAGNOSIS — N182 Chronic kidney disease, stage 2 (mild): Secondary | ICD-10-CM | POA: Diagnosis present

## 2016-12-14 LAB — RAPID URINE DRUG SCREEN, HOSP PERFORMED
AMPHETAMINES: NOT DETECTED
BARBITURATES: NOT DETECTED
Benzodiazepines: NOT DETECTED
COCAINE: POSITIVE — AB
Opiates: POSITIVE — AB
Tetrahydrocannabinol: NOT DETECTED

## 2016-12-14 LAB — GLUCOSE, CAPILLARY: Glucose-Capillary: 598 mg/dL (ref 65–99)

## 2016-12-14 LAB — COMPREHENSIVE METABOLIC PANEL
ALBUMIN: 3.3 g/dL — AB (ref 3.5–5.0)
ALK PHOS: 112 U/L (ref 38–126)
ALT: 30 U/L (ref 17–63)
ANION GAP: 11 (ref 5–15)
AST: 77 U/L — ABNORMAL HIGH (ref 15–41)
BUN: 12 mg/dL (ref 6–20)
CALCIUM: 8.8 mg/dL — AB (ref 8.9–10.3)
CO2: 21 mmol/L — ABNORMAL LOW (ref 22–32)
Chloride: 103 mmol/L (ref 101–111)
Creatinine, Ser: 1.23 mg/dL (ref 0.61–1.24)
GFR calc non Af Amer: >60 mL/min (ref >60)
GLUCOSE: 302 mg/dL — AB (ref 65–99)
POTASSIUM: 4.8 mmol/L (ref 3.5–5.1)
Sodium: 135 mmol/L (ref 135–145)
TOTAL PROTEIN: 8.2 g/dL — AB (ref 6.5–8.1)
Total Bilirubin: 0.5 mg/dL (ref 0.3–1.2)

## 2016-12-14 LAB — LACTIC ACID, PLASMA
Lactic Acid, Venous: 4.4 mmol/L (ref 0.5–1.9)
Lactic Acid, Venous: 4.6 mmol/L (ref 0.5–1.9)

## 2016-12-14 LAB — BASIC METABOLIC PANEL
Anion gap: 6 (ref 5–15)
BUN: 15 mg/dL (ref 6–20)
CO2: 23 mmol/L (ref 22–32)
Calcium: 7.8 mg/dL — ABNORMAL LOW (ref 8.9–10.3)
Chloride: 104 mmol/L (ref 101–111)
Creatinine, Ser: 1.3 mg/dL — ABNORMAL HIGH (ref 0.61–1.24)
GFR calc Af Amer: >60 mL/min (ref >60)
GFR calc non Af Amer: 59 mL/min — ABNORMAL LOW (ref >60)
Glucose, Bld: 661 mg/dL (ref 65–99)
Potassium: 4.7 mmol/L (ref 3.5–5.1)
Sodium: 133 mmol/L — ABNORMAL LOW (ref 135–145)

## 2016-12-14 LAB — PROCALCITONIN

## 2016-12-14 LAB — CBC WITH DIFFERENTIAL/PLATELET
BASOS ABS: 0 10*3/uL (ref 0.0–0.1)
BASOS PCT: 1 %
Eosinophils Absolute: 0 10*3/uL (ref 0.0–0.7)
Eosinophils Relative: 0 %
HEMATOCRIT: 38.2 % — AB (ref 39.0–52.0)
Hemoglobin: 12.5 g/dL — ABNORMAL LOW (ref 13.0–17.0)
Lymphocytes Relative: 41 %
Lymphs Abs: 1.8 10*3/uL (ref 0.7–4.0)
MCH: 25.2 pg — ABNORMAL LOW (ref 26.0–34.0)
MCHC: 32.7 g/dL (ref 30.0–36.0)
MCV: 76.9 fL — ABNORMAL LOW (ref 78.0–100.0)
Monocytes Absolute: 0.4 10*3/uL (ref 0.1–1.0)
Monocytes Relative: 8 %
NEUTROS ABS: 2.2 10*3/uL (ref 1.7–7.7)
Neutrophils Relative %: 50 %
PLATELETS: 271 10*3/uL (ref 150–400)
RBC: 4.97 MIL/uL (ref 4.22–5.81)
RDW: 13.9 % (ref 11.5–15.5)
WBC: 4.5 10*3/uL (ref 4.0–10.5)

## 2016-12-14 LAB — I-STAT CG4 LACTIC ACID, ED: LACTIC ACID, VENOUS: 2.5 mmol/L — AB (ref 0.5–1.9)

## 2016-12-14 LAB — C-REACTIVE PROTEIN: CRP: <0.8 mg/dL (ref <1.0)

## 2016-12-14 LAB — SEDIMENTATION RATE: Sed Rate: 38 mm/hr — ABNORMAL HIGH (ref 0–16)

## 2016-12-14 MED ORDER — HYDRALAZINE HCL 20 MG/ML IJ SOLN: 5.0000 mg | INTRAMUSCULAR | Status: DC | PRN

## 2016-12-14 MED ORDER — INSULIN GLARGINE 100 UNIT/ML ~~LOC~~ SOLN
28.0000 [IU] | Freq: Every day | SUBCUTANEOUS | Status: DC
  Administered 2016-12-14: 28 [IU] via SUBCUTANEOUS
  Filled 2016-12-14 (×2): qty 0.28

## 2016-12-14 MED ORDER — SODIUM CHLORIDE 0.9 % IV BOLUS (SEPSIS)
1000.0000 mL | Freq: Once | INTRAVENOUS | Status: AC
  Administered 2016-12-14: 1000 mL via INTRAVENOUS

## 2016-12-14 MED ORDER — PIPERACILLIN-TAZOBACTAM 3.375 G IVPB
3.3750 g | Freq: Three times a day (TID) | INTRAVENOUS | Status: DC
  Administered 2016-12-15 – 2016-12-18 (×12): 3.375 g via INTRAVENOUS
  Filled 2016-12-14 (×17): qty 50

## 2016-12-14 MED ORDER — SODIUM CHLORIDE 0.9 % IV BOLUS (SEPSIS)
1500.0000 mL | Freq: Once | INTRAVENOUS | Status: AC
  Administered 2016-12-14: 1500 mL via INTRAVENOUS

## 2016-12-14 MED ORDER — IBUPROFEN 200 MG PO TABS: 200.0000 mg | ORAL_TABLET | ORAL | Status: DC | PRN

## 2016-12-14 MED ORDER — SODIUM CHLORIDE 0.9 % IV BOLUS (SEPSIS)
1000.0000 mL | Freq: Once | INTRAVENOUS | Status: AC
  Administered 2016-12-15: 1000 mL via INTRAVENOUS

## 2016-12-14 MED ORDER — ENOXAPARIN SODIUM 40 MG/0.4ML ~~LOC~~ SOLN
40.0000 mg | SUBCUTANEOUS | Status: DC
  Administered 2016-12-14 – 2016-12-16 (×3): 40 mg via SUBCUTANEOUS
  Filled 2016-12-14 (×3): qty 0.4

## 2016-12-14 MED ORDER — GABAPENTIN 300 MG PO CAPS
600.0000 mg | ORAL_CAPSULE | Freq: Every day | ORAL | Status: DC
  Administered 2016-12-14 – 2016-12-22 (×9): 600 mg via ORAL
  Filled 2016-12-14 (×9): qty 2

## 2016-12-14 MED ORDER — INSULIN ASPART 100 UNIT/ML ~~LOC~~ SOLN
0.0000 [IU] | Freq: Three times a day (TID) | SUBCUTANEOUS | Status: DC
  Administered 2016-12-15: 2 [IU] via SUBCUTANEOUS
  Administered 2016-12-16: 3 [IU] via SUBCUTANEOUS
  Administered 2016-12-17: 2 [IU] via SUBCUTANEOUS
  Administered 2016-12-19: 5 [IU] via SUBCUTANEOUS

## 2016-12-14 MED ORDER — VANCOMYCIN HCL IN DEXTROSE 1-5 GM/200ML-% IV SOLN
1000.0000 mg | Freq: Once | INTRAVENOUS | Status: AC
  Administered 2016-12-14: 1000 mg via INTRAVENOUS
  Filled 2016-12-14: qty 200

## 2016-12-14 MED ORDER — ZOLPIDEM TARTRATE 5 MG PO TABS
5.0000 mg | ORAL_TABLET | Freq: Every evening | ORAL | Status: DC | PRN
  Administered 2016-12-15 – 2016-12-19 (×3): 5 mg via ORAL
  Filled 2016-12-14 (×3): qty 1

## 2016-12-14 MED ORDER — OXYCODONE-ACETAMINOPHEN 5-325 MG PO TABS
1.0000 | ORAL_TABLET | ORAL | Status: DC | PRN
  Administered 2016-12-14 – 2016-12-16 (×9): 1 via ORAL
  Filled 2016-12-14 (×9): qty 1

## 2016-12-14 MED ORDER — PIPERACILLIN-TAZOBACTAM 3.375 G IVPB
3.3750 g | Freq: Three times a day (TID) | INTRAVENOUS | Status: DC
Start: 2016-12-14 — End: 2016-12-14
  Filled 2016-12-14 (×2): qty 50

## 2016-12-14 MED ORDER — ADULT MULTIVITAMIN W/MINERALS CH
1.0000 | ORAL_TABLET | Freq: Every day | ORAL | Status: DC
  Administered 2016-12-15 – 2016-12-23 (×8): 1 via ORAL
  Filled 2016-12-14 (×8): qty 1

## 2016-12-14 MED ORDER — KETOROLAC TROMETHAMINE 15 MG/ML IJ SOLN
15.0000 mg | Freq: Once | INTRAMUSCULAR | Status: AC
  Administered 2016-12-14: 15 mg via INTRAVENOUS
  Filled 2016-12-14: qty 1

## 2016-12-14 MED ORDER — VANCOMYCIN HCL IN DEXTROSE 750-5 MG/150ML-% IV SOLN
750.0000 mg | INTRAVENOUS | Status: DC
  Administered 2016-12-15 – 2016-12-18 (×4): 750 mg via INTRAVENOUS
  Filled 2016-12-14 (×5): qty 150

## 2016-12-14 MED ORDER — PANCRELIPASE (LIP-PROT-AMYL) 12000-38000 UNITS PO CPEP
24000.0000 [IU] | ORAL_CAPSULE | Freq: Three times a day (TID) | ORAL | Status: DC
  Administered 2016-12-15 – 2016-12-23 (×24): 24000 [IU] via ORAL
  Filled 2016-12-14 (×28): qty 2

## 2016-12-14 MED ORDER — ATORVASTATIN CALCIUM 40 MG PO TABS
40.0000 mg | ORAL_TABLET | Freq: Every day | ORAL | Status: DC
  Administered 2016-12-15 – 2016-12-23 (×8): 40 mg via ORAL
  Filled 2016-12-14 (×8): qty 1

## 2016-12-14 MED ORDER — NICOTINE 21 MG/24HR TD PT24
21.0000 mg | MEDICATED_PATCH | Freq: Every day | TRANSDERMAL | Status: DC
  Administered 2016-12-15 – 2016-12-23 (×9): 21 mg via TRANSDERMAL
  Filled 2016-12-14 (×9): qty 1

## 2016-12-14 MED ORDER — ONDANSETRON HCL 4 MG/2ML IJ SOLN: 4.0000 mg | Freq: Three times a day (TID) | INTRAMUSCULAR | Status: DC | PRN

## 2016-12-14 MED ORDER — ASPIRIN EC 81 MG PO TBEC
81.0000 mg | DELAYED_RELEASE_TABLET | Freq: Every day | ORAL | Status: DC
  Administered 2016-12-15 – 2016-12-23 (×8): 81 mg via ORAL
  Filled 2016-12-14 (×8): qty 1

## 2016-12-14 MED ORDER — SODIUM CHLORIDE 0.9 % IV SOLN
INTRAVENOUS | Status: DC
  Administered 2016-12-14: 100 mL/h via INTRAVENOUS
  Administered 2016-12-15: 125 mL/h via INTRAVENOUS

## 2016-12-14 MED ORDER — MIRTAZAPINE 15 MG PO TABS
30.0000 mg | ORAL_TABLET | Freq: Every day | ORAL | Status: DC
  Administered 2016-12-15 – 2016-12-22 (×9): 30 mg via ORAL
  Filled 2016-12-14: qty 2
  Filled 2016-12-14: qty 1
  Filled 2016-12-14 (×2): qty 2
  Filled 2016-12-14 (×3): qty 1
  Filled 2016-12-14: qty 2
  Filled 2016-12-14: qty 1
  Filled 2016-12-14 (×6): qty 2

## 2016-12-14 MED ORDER — INSULIN ASPART 100 UNIT/ML ~~LOC~~ SOLN
15.0000 [IU] | Freq: Once | SUBCUTANEOUS | Status: AC
  Administered 2016-12-15: 15 [IU] via SUBCUTANEOUS

## 2016-12-14 NOTE — ED Notes (Signed)
Pt given Malaw[place]urkey sandwich and graham crackers per [place], [place]. Phlebotomy called to redraw lactic per Trey PaulaJeff, [place].

## 2016-12-14 NOTE — ED Notes (Addendum)
Per Trey PaulaJeff, PA, lactic acid to be postponed until fluids administered to pt. Phlebotomy aware. Will postpone order.

## 2016-12-14 NOTE — ED Notes (Signed)
Pt ambulatory to restroom accompanied by this nurse.  

## 2016-12-14 NOTE — ED Notes (Signed)
Phlebotomy at bedside.

## 2016-12-14 NOTE — Progress Notes (Signed)
[facility] informed of [facility] ordered. Ilean SkillVeronica Ulys Favia LPN

## 2016-12-14 NOTE — ED Notes (Signed)
IV team at bedside 

## 2016-12-14 NOTE — H&P (Addendum)
History and Physical    Raymond Winters XWR:[id number] DOB: [birth year] DOA: 12/14/2016  Referring MD/NP/PA:   PCP: Arnoldo Morale, MD   Patient coming from:  The patient is coming from home.  At baseline, pt is independent for most of ADL.   Chief Complaint: right ankle and foot pain  HPI: Raymond Winters is a 56 y.o. male with medical history significant of IVDU,  hyperlipidemia, diabetes mellitus, depression, hearing abuse, tobacco abuse, alcohol abuse, alcoholic liver disease, chronic elevation of lactic acid, CKD-II, who presents with right ankle and foot pain.  Pt states that he tried to inject heroin into his right ankle, but missed vein 10 days ago. He developed the right ankle and foot swelling, pain and erythema, which has been progressively getting worse. He was treated with 7 day course of Augmentin without significant improvement. He reports chills, but no fever. He reports that his pain is constant, 10 out of 10 in severity, nonradiating. He has nausea, and vomited twice without blood in the vomitus which has resolved. He has chronic alcoholic pancreatitis and chronic mild intermittent diarrhea, which has not changed. No abdominal pain currently. Patient denies chest pain, SOB, cough, symptoms of UTI or unilateral weakness. Patient reports that his last drinking of alcohol was 4 days ago.  ED Course: pt was found to have  WBC 4.5, lactic acid is 2.5, stable renal function, temperature normal, no tachycardia, oxygen saturation 100% on room air, x-ray of right ankle showed soft tissue swelling, no bony fracture. Patient is placed on MedSurg bed for observation.   Review of Systems:   General: no fevers, has chills, no changes in body weight, has fatigue HEENT: no blurry vision, hearing changes or sore throat Respiratory: no dyspnea, coughing, wheezing CV: no chest pain, no palpitations GI: has nausea, vomiting, diarrhea. No constipation or abdominal pain,  GU: no dysuria, burning on urination,  increased urinary frequency, hematuria  Ext: has right ankle swelling and pain. Neuro: no unilateral weakness, numbness, or tingling, no vision change or hearing loss Skin: no rash, no skin tear. MSK: No muscle spasm, no deformity, no limitation of range of movement in spin Heme: No easy bruising.  Travel history: No recent long distant travel.  Allergy:  Allergies  Allergen Reactions  . Ativan [Lorazepam] Other (See Comments)    HALLUCINATIONS    Past Medical History:  Diagnosis Date  . Alcohol dependence ([place]) 01/05/2016  . Alcoholic liver disease, unspecified ([place]) 01/05/2016  . Arthritis    "lower extremeties, hands, shoulders, back" (08/20/2016)  . Chronic alcoholic pancreatitis ([place]) 01/05/2016  . Depression   . Family history of adverse reaction to anesthesia    pt's sister has hx. of post-op N/V  . Hepatitis C    "never treated" (08/20/2016)  . Heroin abuse   . High cholesterol   . Hypoalbuminemia due to protein-calorie malnutrition ([place]) 01/05/2016  . Insulin dependent diabetes mellitus ([place])   . Lactic acid increased, CHRONIC 01/05/2016   Secondary to Liver Disease, Alcoholism, and Diabetes  . 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 Shoulder wound 01/2016   left    Past Surgical History:  Procedure Laterality Date  . APPLICATION OF WOUND VAC Left 12/24/2015   Procedure: APPLICATION OF WOUND VAC; LEFT SHOULDER;  Surgeon: Leandrew Koyanagi, MD;  Location: [facility];  Service: Orthopedics;  Laterality: Left;  . APPLICATION OF WOUND VAC Left 01/29/2016   Procedure: LEFT SHOULDER SPLIT THICKNESS SKIN GRAFT, WOUND VAC;  Surgeon: Leandrew Koyanagi, MD;  Location: [facility];  Service: Orthopedics;  Laterality: Left;  . I&D EXTREMITY Right 03/20/2013   Procedure: IRRIGATION AND DEBRIDEMENT EXTREMITY;  Surgeon: Schuyler Amor, MD;  Location: [facility];  Service: Orthopedics;  Laterality: Right;  . I&D EXTREMITY Left 12/24/2015   Procedure:  DEBRIDEMENT OF  MUSCLE, SUBCUTANEOUS TISSUE LEFT SHOULDER;  Surgeon: Leandrew Koyanagi, MD;  Location: [facility];  Service: Orthopedics;  Laterality: Left;  . INCISION AND DRAINAGE ABSCESS Right 08/19/2016   Procedure: DEBRIDEMENT RIGHT BUTTOCK  ABSCESS;  Surgeon: Donnie Mesa, MD;  Location: [facility];  Service: General;  Laterality: Right;  . IRRIGATION AND DEBRIDEMENT ABSCESS Right 08/19/2016   buttocks  . IRRIGATION AND DEBRIDEMENT SHOULDER Left 12/27/2015   Procedure: IRRIGATION AND DEBRIDEMENT LEFT SHOULDER;  Surgeon: Leandrew Koyanagi, MD;  Location: [facility];  Service: Orthopedics;  Laterality: Left;  . IRRIGATION AND DEBRIDEMENT SHOULDER Left 12/30/2015   Procedure: IRRIGATION AND DEBRIDEMENT LEFT SHOULDER;  Surgeon: Leandrew Koyanagi, MD;  Location: [facility];  Service: Orthopedics;  Laterality: Left;  . SKIN SPLIT GRAFT Left 01/29/2016   Procedure: SKIN GRAFT SPLIT THICKNESS,;  Surgeon: Leandrew Koyanagi, MD;  Location: [place] [facility];  Service: Orthopedics;  Laterality: Left;    Social History:  reports that he has been smoking Cigarettes.  He has a 18.50 pack-year smoking history. He has never used smokeless tobacco. He reports that he uses drugs, including IV and Heroin. He reports that he does not drink alcohol.  Family History:  Family History  Problem Relation Age of Onset  . Diabetes Mother   . Heart disease Mother   . Hypertension Mother   . Stroke Mother   . Vision loss Mother   . Heart disease Father   . Hypertension Father   . Anesthesia problems Sister        post-op N/V     Prior to Admission medications   Medication Sig Start Date End Date Taking? Authorizing Provider  amoxicillin-clavulanate (AUGMENTIN) 875-125 MG tablet Take 1 tablet by mouth 2 (two) times daily. One po bid x 7 days 12/07/16   Daleen Bo, MD  aspirin EC 81 MG tablet Take 1 tablet (81 mg total) by mouth daily. 04/04/14   Lance Bosch, NP  atorvastatin (LIPITOR) 40 MG tablet Take 1 tablet (40 mg total) by mouth daily. 06/25/16    Arnoldo Morale, MD  Blood Glucose Monitoring Suppl (ACCU-CHEK AVIVA) device Use as instructed 3 times before meals 09/10/16   Arnoldo Morale, MD  gabapentin (NEURONTIN) 300 MG capsule TAKE 2 CAPSULE BY MOUTH AT BEDTIME FOR DIABETIC NERVE PAIN 12/11/16   Arnoldo Morale, MD  glucose blood (ACCU-CHEK AVIVA) test strip Use as instructed 3 times before meals 08/26/16   Arnoldo Morale, MD  insulin aspart (NOVOLOG FLEXPEN) 100 UNIT/ML FlexPen 0-12 units before meals as per sliding scale as directed 10/14/16   Arnoldo Morale, MD  Insulin Glargine (BASAGLAR KWIKPEN) 100 UNIT/ML SOPN Inject 0.35 mLs (35 Units total) into the skin at bedtime. 12/11/16   Arnoldo Morale, MD  Insulin Pen Needle 31G X 5 MM MISC Use 3 times daily before meals and at bedtime 12/08/16   Arnoldo Morale, MD  Lancet Devices [facility]) lancets Use as instructed 3 times before meals 08/26/16   Arnoldo Morale, MD  meloxicam (MOBIC) 7.5 MG tablet Take 1 tablet (7.5 mg total) by mouth daily as needed for pain. 12/08/16   Arnoldo Morale, MD  mirtazapine (  REMERON) 30 MG tablet Take 1 tablet (30 mg total) by mouth at bedtime. 09/02/16   Arnoldo Morale, MD  Multiple Vitamins-Minerals (MULTIVITAMIN WITH MINERALS) tablet Take 1 tablet by mouth daily.    [provider]  oxyCODONE-acetaminophen (PERCOCET) 5-325 MG tablet Take 1 tablet by mouth every 4 (four) hours as needed. Patient not taking: Reported on 12/11/2016 12/07/16   Daleen Bo, MD  Pancrelipase, Lip-Prot-Amyl, (ZENPEP) 25000 units CPEP Take 25,000 Units by mouth 3 (three) times daily before meals. 11/03/16   Arnoldo Morale, MD  traMADol (ULTRAM) 50 MG tablet Take 1 tablet (50 mg total) by mouth every 6 (six) hours as needed. Patient not taking: Reported on 12/11/2016 11/06/16   Arnoldo Morale, MD    Physical Exam: Vitals:   12/14/16 1220 12/14/16 1550 12/14/16 1726 12/14/16 1920  BP:  125/77  (!) 156/81  Pulse:  67 68 (!) 58  Resp:  '14 16 18  '$ Temp:      TempSrc:      SpO2:  100%   100%  Weight: 49 kg (108 lb)     Height: '5\' 7"'$  (1.702 m)      General: Not in acute distress HEENT:       Eyes: PERRL, EOMI, no scleral icterus.       ENT: No discharge from the ears and nose, no pharynx injection, no tonsillar enlargement.        Neck: No JVD, no bruit, no mass felt. Heme: No neck lymph node enlargement. Cardiac: S1/S2, RRR, No murmurs, No gallops or rubs. Respiratory: No rales, wheezing, rhonchi or rubs. GI: Soft, nondistended, nontender, no rebound pain, no organomegaly, BS present. GU: No hematuria Ext: 1+DP/PT pulse bilaterally. There is an injection mark in the medial side of right ankle. There is swelling to right ankle and proximal foot, with tenderness and mild warmth. No purulence or fluctuance  Musculoskeletal: No joint deformities, No joint redness or warmth, no limitation of ROM in spin. Skin: No rashes.  Neuro: Alert, oriented X3, cranial nerves II-XII grossly intact, moves all extremities normally.  Psych: Patient is not psychotic, no suicidal or hemocidal ideation.  Labs on Admission: I have personally reviewed following labs and imaging studies  CBC:  Recent Labs Lab 12/14/16 1232  WBC 4.5  NEUTROABS 2.2  HGB 12.5*  HCT 38.2*  MCV 76.9*  PLT 277   Basic Metabolic Panel:  Recent Labs Lab 12/14/16 1232  NA 135  K 4.8  CL 103  CO2 21*  GLUCOSE 302*  BUN 12  CREATININE 1.23  CALCIUM 8.8*   GFR: Estimated Creatinine Clearance: 45.9 mL/min (by C-G formula based on SCr of 1.23 mg/dL). Liver Function Tests:  Recent Labs Lab 12/14/16 1232  AST 77*  ALT 30  ALKPHOS 112  BILITOT 0.5  PROT 8.2*  ALBUMIN 3.3*   No results for input(s): LIPASE, AMYLASE in the last 168 hours. No results for input(s): AMMONIA in the last 168 hours. Coagulation Profile: No results for input(s): INR, PROTIME in the last 168 hours. Cardiac Enzymes: No results for input(s): CKTOTAL, CKMB, CKMBINDEX, TROPONINI in the last 168 hours. BNP (last 3  results) No results for input(s): PROBNP in the last 8760 hours. HbA1C: No results for input(s): HGBA1C in the last 72 hours. CBG: No results for input(s): GLUCAP in the last 168 hours. Lipid Profile: No results for input(s): CHOL, HDL, LDLCALC, TRIG, CHOLHDL, LDLDIRECT in the last 72 hours. Thyroid Function Tests: No results for input(s): TSH, T4TOTAL, FREET4, T3FREE,  THYROIDAB in the last 72 hours. Anemia Panel: No results for input(s): VITAMINB12, FOLATE, FERRITIN, TIBC, IRON, RETICCTPCT in the last 72 hours. Urine analysis:    Component Value Date/Time   COLORURINE YELLOW 12/07/2016 1447   APPEARANCEUR HAZY (A) 12/07/2016 1447   LABSPEC 1.017 12/07/2016 1447   PHURINE 6.0 12/07/2016 1447   GLUCOSEU >=500 (A) 12/07/2016 1447   HGBUR SMALL (A) 12/07/2016 1447   BILIRUBINUR NEGATIVE 12/07/2016 1447   BILIRUBINUR neg 09/26/2015 1531   KETONESUR NEGATIVE 12/07/2016 1447   PROTEINUR 30 (A) 12/07/2016 1447   UROBILINOGEN 0.2 09/26/2015 1531   UROBILINOGEN 0.2 12/05/2013 0721   NITRITE NEGATIVE 12/07/2016 1447   LEUKOCYTESUR LARGE (A) 12/07/2016 1447   Sepsis Labs: '@LABRCNTIP'$ (procalcitonin:4,lacticidven:4) )No results found for this or any previous visit (from the past 240 hour(s)).   Radiological Exams on Admission: Dg Ankle Complete Right  Result Date: 12/14/2016 CLINICAL DATA:  Right ankle pain and swelling with probable infection. EXAM: RIGHT ANKLE - COMPLETE 3+ VIEW COMPARISON:  12/08/2016 FINDINGS: There is no evidence of acute fracture, subluxation or dislocation. No radiographic evidence of osteomyelitis noted. Mild soft tissue swelling noted. IMPRESSION: Mild soft tissue swelling without acute bony abnormality. Electronically Signed   By: Margarette Canada M.D.   On: 12/14/2016 18:37     EKG:  Not done in ED, will get one.   Assessment/Plan Principal Problem:   Cellulitis of right ankle Active Problems:   Heroin abuse   Alcohol abuse   Tobacco abuse   Type 2 diabetes  mellitus with complication, with long-term current use of insulin (HCC)   Lactic acid increased, CHRONIC   Depression   Cellulitis of foot, right   Chronic kidney disease (CKD), stage II (mild)   Cellulitis of right ankle and foot: He does not have fever or leukocytosis, hemodynamically stable. Clinically does not seem to have sepsis. Patient's elevated lactic acid is chronic issue. Today his lactic acid is 2.5 which was 3.5 on 01/04/16. Pt failed outpatient oral antibiotic treatment. - will place on med-surg tele bed for obs - Empiric antimicrobial treatment with vancomycin and Zosyn per pharmacy - PRN Zofran for nausea, Percocet for pain (avoid IV narcotics due to hx of IVDU) - Blood cultures x 2  - ESR and CRP - will get Procalcitonin and trend lactic acid levels - IVF: 2.5 L of NS bolus in ED, followed by 125 cc/h -prn ibuprofen for fever and mild pain  Hx of polysubstance abuse:  including alcohol, hearing and tobacco. Last drining of alcohol was 4 days ago, no signs of withdraw. -Did counseling about importance of quitting substance. -Nicotine patch -UDS -pt is allergic to ativan. Will observe any signs of alcohol withdraw. If shows signs of withdraw,   Depression Stable, no suicidal or homicidal ideations. -Continue home medications: Mirtazapine  HLD: -Lipitor  DM-II: Last A1c 13.2 on 12/09/15, poorly controled. Patient is taking NovoLog and the glargine insulin at home -will decrease  glargine insulin dose from  35-28 units daily -SSI  Chronic kidney disease (CKD), stage II (mild): stable. Baseline creatinine 1.1-1.3. His creatinine is 1.23 on admission. -Follow-up renal function by BMP   DVT ppx: SQ Lovenox Code Status: Full code Family Communication: None at bed side.   Disposition Plan:  Anticipate discharge back to previous home environment Consults called:  none Admission status:  medical floor/obs   Date of Service 12/14/2016    Ivor Costa [facility] Pager [phone]  If 7PM-7AM, please contact night-coverage [email or web address] Password  [email or web address] 12/14/2016, 7:47 PM

## 2016-12-14 NOTE — ED Triage Notes (Signed)
Pt states he is IV drug user. Attempted to inject heroin into his right foot but missed the vein. Swelling and redness noted. Pt states he took a 7 day dose of amoxicillin without improvement. Last drug use yesterday.

## 2016-12-14 NOTE — ED Notes (Signed)
Unable to gain IV access, x3 attempts, IV team consulted

## 2016-12-14 NOTE — Progress Notes (Signed)
CRITICAL VALUE ALERT  Critical Value: Lactic Acid 4.6, Blood Glucose 661  Date & Time Notied:  12/14/16,2331  Provider Notified: [facility] on call  Orders Received/Actions Insulin and IV bolus

## 2016-12-14 NOTE — ED Provider Notes (Signed)
[facility]-EMERGENCY DEPT Provider Note   CSN: [id number] Arrival date & time: 12/14/16  1211     History   Chief Complaint Chief Complaint  Patient presents with  . Foot Swelling    HPI Raymond Winters is a 56 y.o. male.  HPI  56 year old male with history of substance abuse, chronic pancreatitis, hepatitis C presents today with complaints of right ankle and foot swelling. Patient notes approximately 10 days ago he was attempting to inject heroin into his right medial ankle.  He notes this was unsuccessful and caused immediate discomfort.  Patient notes he followed up in the emergency room several days after with swelling to the right foot and ankle.  He was started on antibiotics at that time and followed up as an outpatient.  Patient notes he seen his primary care several times since then who has been keeping an eye on it.  Patient notes that the swelling has not improved with antibiotics, has been slightly worsening, he has had chills, nausea and vomiting, denies any fever at home.     Past Medical History:  Diagnosis Date  . Alcohol dependence (HCC) 01/05/2016  . Alcoholic liver disease, unspecified (HCC) 01/05/2016  . Arthritis    "lower extremeties, hands, shoulders, back" (08/20/2016)  . Chronic alcoholic pancreatitis (HCC) 01/05/2016  . Depression   . Family history of adverse reaction to anesthesia    pt's sister has hx. of post-op N/V  . Hepatitis C    "never treated" (08/20/2016)  . Heroin abuse   . High cholesterol   . Hypoalbuminemia due to protein-calorie malnutrition (HCC) 01/05/2016  . Insulin dependent diabetes mellitus (HCC)   . Lactic acid increased, CHRONIC 01/05/2016   Secondary to Liver Disease, Alcoholism, and Diabetes  . 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 Shoulder wound 01/2016   left    Patient Active Problem List   Diagnosis Date Noted  . Cellulitis of foot, right 12/14/2016  . Cellulitis of right ankle 12/14/2016  . Chronic kidney  disease (CKD), stage II (mild) 12/14/2016  . Non compliance w medication regimen 12/08/2016  . Necrotizing fasciitis (HCC) 08/19/2016  . Hypoglycemia 06/22/2016  . Depression 02/03/2016  . Chronic hepatitis C (HCC)   . Alcoholic liver disease, unspecified (HCC) 01/05/2016  . Alcohol dependence (HCC) 01/05/2016  . Chronic alcoholic pancreatitis (HCC) 01/05/2016  . Hypoalbuminemia due to protein-calorie malnutrition (HCC) 01/05/2016  . Lactic acid increased, CHRONIC 01/05/2016  . Diarrhea 01/04/2016  . Chronic diarrhea 01/04/2016  . Pressure ulcer 01/04/2016  . Uncontrolled type 2 diabetes mellitus with complication, with long-term current use of insulin (HCC)   . Swelling   . Type 2 diabetes mellitus with complication, with long-term current use of insulin (HCC)   . Septic joint of left shoulder region (HCC)   . Abscess 12/24/2015  . Abscess of left shoulder   . Leukocytosis   . Type 2 diabetes mellitus with complication, without long-term current use of insulin (HCC)   . Glenohumeral arthritis 07/24/2015  . Left shoulder pain 07/24/2015  . 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 Tobacco abuse 07/26/2014  . Protein-calorie malnutrition, severe (HCC) 12/05/2013  . Hyperkalemia 12/04/2013  . Heroin withdrawal (HCC) 12/04/2013  . Alcohol abuse 12/04/2013  . Hyperglycemia 12/04/2013  . Metabolic acidosis with respiratory alkalosis 12/04/2013  . Heroin abuse 04/04/2013  . DM2 (diabetes mellitus, type 2) (HCC) 03/20/2013    Past Surgical History:  Procedure Laterality Date  . APPLICATION OF WOUND VAC Left  12/24/2015   Procedure: APPLICATION OF WOUND VAC; LEFT SHOULDER;  Surgeon: Tarry KosNaiping M Xu, MD;  Location: [facility] OR;  Service: Orthopedics;  Laterality: Left;  . APPLICATION OF WOUND VAC Left 01/29/2016   Procedure: LEFT SHOULDER SPLIT THICKNESS SKIN GRAFT, WOUND VAC;  Surgeon: Tarry KosNaiping M Xu, MD;  Location: [facility];  Service: Orthopedics;  Laterality: Left;  . I&D EXTREMITY  Right 03/20/2013   Procedure: IRRIGATION AND DEBRIDEMENT EXTREMITY;  Surgeon: Marlowe ShoresMatthew A Weingold, MD;  Location: [facility] OR;  Service: Orthopedics;  Laterality: Right;  . I&D EXTREMITY Left 12/24/2015   Procedure:  DEBRIDEMENT OF MUSCLE, SUBCUTANEOUS TISSUE LEFT SHOULDER;  Surgeon: Tarry KosNaiping M Xu, MD;  Location: [facility] OR;  Service: Orthopedics;  Laterality: Left;  . INCISION AND DRAINAGE ABSCESS Right 08/19/2016   Procedure: DEBRIDEMENT RIGHT BUTTOCK  ABSCESS;  Surgeon: Manus RuddMatthew Tsuei, MD;  [facility];  Service: General;  Laterality: Right;  . IRRIGATION AND DEBRIDEMENT ABSCESS Right 08/19/2016   buttocks  . IRRIGATION AND DEBRIDEMENT SHOULDER Left 12/27/2015   Procedure: IRRIGATION AND DEBRIDEMENT LEFT SHOULDER;  Surgeon: Tarry KosNaiping M Xu, MD;  Location: [facility];  Service: Orthopedics;  Laterality: Left;  . IRRIGATION AND DEBRIDEMENT SHOULDER Left 12/30/2015   Procedure: IRRIGATION AND DEBRIDEMENT LEFT SHOULDER;  Surgeon: Tarry KosNaiping M Xu, MD;  Location: [facility];  Service: Orthopedics;  Laterality: Left;  . SKIN SPLIT GRAFT Left 01/29/2016   Procedure: SKIN GRAFT SPLIT THICKNESS,;  Surgeon: Tarry KosNaiping M Xu, MD;  Location: [facility];  Service: Orthopedics;  Laterality: Left;       Home Medications    Prior to Admission medications   Medication Sig Start Date End Date Taking? Authorizing Provider  aspirin EC 81 MG tablet Take 1 tablet (81 mg total) by mouth daily. 04/04/14  Yes Ambrose FinlandKeck, Valerie A, NP  atorvastatin (LIPITOR) 40 MG tablet Take 1 tablet (40 mg total) by mouth daily. 06/25/16  Yes Jaclyn ShaggyAmao, [name], MD  gabapentin (NEURONTIN) 300 MG capsule TAKE 2 CAPSULE BY MOUTH AT BEDTIME FOR DIABETIC NERVE PAIN Patient taking differently: Take 600 mg by mouth at bedtime. FOR DIABETIC NERVE PAIN 12/11/16  Yes Jaclyn ShaggyAmao, [name], MD  insulin aspart (NOVOLOG FLEXPEN) 100 UNIT/ML FlexPen 0-12 units before meals as per sliding scale as directed Patient taking differently: Inject 4-12 Units into the skin 3 (three) times  daily with meals. 0-12 units before meals as per sliding scale as directed 10/14/16  Yes Amao, Odette HornsEnobong, MD  Insulin Glargine (LANTUS SOLOSTAR) 100 UNIT/ML Solostar Pen Inject 30 Units into the skin at bedtime.   Yes [provider]  meloxicam (MOBIC) 7.5 MG tablet Take 1 tablet (7.5 mg total) by mouth daily as needed for pain. 12/08/16  Yes Jaclyn ShaggyAmao, [name], MD  mirtazapine (REMERON) 30 MG tablet Take 1 tablet (30 mg total) by mouth at bedtime. 09/02/16  Yes Jaclyn ShaggyAmao, [name], MD  Multiple Vitamins-Minerals (MULTIVITAMIN WITH MINERALS) tablet Take 1 tablet by mouth daily.   Yes [provider]  Pancrelipase, Lip-Prot-Amyl, (ZENPEP) 25000 units CPEP Take 25,000 Units by mouth 3 (three) times daily before meals. 11/03/16  Yes Jaclyn ShaggyAmao, [name], MD  Blood Glucose Monitoring Suppl (ACCU-CHEK AVIVA) device Use as instructed 3 times before meals 09/10/16   Jaclyn ShaggyAmao, [name], MD  glucose blood (ACCU-CHEK AVIVA) test strip Use as instructed 3 times before meals 08/26/16   Jaclyn ShaggyAmao, [name], MD  Insulin Glargine (BASAGLAR KWIKPEN) 100 UNIT/ML SOPN Inject 0.35 mLs (35 Units total) into the skin at bedtime. 12/11/16   Jaclyn ShaggyAmao, [name], MD  Insulin Pen Needle 31G X 5 MM MISC Use 3 times daily before meals and at bedtime 12/08/16   Jaclyn Shaggy, MD  Lancet Devices [facility]) lancets Use as instructed 3 times before meals 08/26/16   Jaclyn Shaggy, MD  oxyCODONE-acetaminophen (PERCOCET) 5-325 MG tablet Take 1 tablet by mouth every 4 (four) hours as needed. Patient not taking: Reported on 12/11/2016 12/07/16   Mancel Bale, MD  traMADol (ULTRAM) 50 MG tablet Take 1 tablet (50 mg total) by mouth every 6 (six) hours as needed. Patient not taking: Reported on 12/11/2016 11/06/16   Jaclyn Shaggy, MD    Family History Family History  Problem Relation Age of Onset  . Diabetes Mother   . Heart disease Mother   . Hypertension Mother   . Stroke Mother   . Vision loss Mother   . Heart disease Father   . Hypertension  Father   . Anesthesia problems Sister        post-op N/V    Social History Social History  Substance Use Topics  . Smoking status: Current Every Day Smoker    Packs/day: 0.50    Years: 37.00    Types: Cigarettes  . Smokeless tobacco: Never Used  . Alcohol use No     Comment: 08/20/2016 "stopped ~ 1 month ago"     Allergies   Ativan [lorazepam]   Review of Systems Review of Systems  All other systems reviewed and are negative.    Physical Exam Updated Vital Signs BP (!) 156/81 (BP Location: Left Arm)   Pulse (!) 58   Temp 97.7 F (36.5 C) (Oral)   Resp 18   Ht 5\' 7"  (1.702 m)   Wt 49 kg (108 lb)   SpO2 100%   BMI 16.92 kg/m   Physical Exam  Constitutional: He is oriented to person, place, and time. He appears well-developed and well-nourished.  HENT:  Head: Normocephalic and atraumatic.  Eyes: Conjunctivae are normal. Pupils are equal, round, and reactive to light. Right eye exhibits no discharge. Left eye exhibits no discharge. No scleral icterus.  Neck: Normal range of motion. No JVD present. No tracheal deviation present.  Pulmonary/Chest: Effort normal. No stridor.  Musculoskeletal:  Swelling to right ankle and proximal foot minor warmth to touch along the medial aspect range of motion of the ankle intact, no purulence or fluctuance  Neurological: He is alert and oriented to person, place, and time. Coordination normal.  Psychiatric: He has a normal mood and affect. His behavior is normal. Judgment and thought content normal.  Nursing note and vitals reviewed.    ED Treatments / Results  Labs (all labs ordered are listed, but only abnormal results are displayed) Labs Reviewed  COMPREHENSIVE METABOLIC PANEL - Abnormal; Notable for the following:       Result Value   CO2 21 (*)    Glucose, Bld 302 (*)    Calcium 8.8 (*)    Total Protein 8.2 (*)    Albumin 3.3 (*)    AST 77 (*)    All other components within normal limits  CBC WITH  DIFFERENTIAL/PLATELET - Abnormal; Notable for the following:    Hemoglobin 12.5 (*)    HCT 38.2 (*)    MCV 76.9 (*)    MCH 25.2 (*)    All other components within normal limits  I-STAT CG4 LACTIC ACID, ED - Abnormal; Notable for the following:    Lactic Acid, Venous 2.50 (*)    All other components within  normal limits  CULTURE, BLOOD (ROUTINE X 2)  CULTURE, BLOOD (ROUTINE X 2)  SEDIMENTATION RATE  C-REACTIVE PROTEIN  BASIC METABOLIC PANEL  CBC  LACTIC ACID, PLASMA  LACTIC ACID, PLASMA  PROCALCITONIN  RAPID URINE DRUG SCREEN, HOSP PERFORMED    EKG  EKG Interpretation None       Radiology Dg Ankle Complete Right  Result Date: 12/14/2016 CLINICAL DATA:  Right ankle pain and swelling with probable infection. EXAM: RIGHT ANKLE - COMPLETE 3+ VIEW COMPARISON:  12/08/2016 FINDINGS: There is no evidence of acute fracture, subluxation or dislocation. No radiographic evidence of osteomyelitis noted. Mild soft tissue swelling noted. IMPRESSION: Mild soft tissue swelling without acute bony abnormality. Electronically Signed   By: Harmon Pier M.D.   On: 12/14/2016 18:37    Procedures Procedures (including critical care time)  Medications Ordered in ED Medications  sodium chloride 0.9 % bolus 1,500 mL (1,500 mLs Intravenous New Bag/Given 12/14/16 2014)  gabapentin (NEURONTIN) capsule 600 mg (not administered)  BASAGLAR KWIKPEN KwikPen 28 Units (not administered)  oxyCODONE-acetaminophen (PERCOCET/ROXICET) 5-325 MG per tablet 1 tablet (1 tablet Oral Given 12/14/16 2014)  Pancrelipase (Lip-Prot-Amyl) CPEP 25,000 Units (not administered)  mirtazapine (REMERON) tablet 30 mg (not administered)  atorvastatin (LIPITOR) tablet 40 mg (not administered)  multivitamin with minerals tablet 1 tablet (not administered)  aspirin EC tablet 81 mg (not administered)  ondansetron (ZOFRAN) injection 4 mg (not administered)  0.9 %  sodium chloride infusion (not administered)  enoxaparin (LOVENOX)  injection 40 mg (not administered)  zolpidem (AMBIEN) tablet 5 mg (not administered)  hydrALAZINE (APRESOLINE) injection 5 mg (not administered)  insulin aspart (novoLOG) injection 0-9 Units (not administered)  piperacillin-tazobactam (ZOSYN) IVPB 3.375 g (not administered)  vancomycin (VANCOCIN) IVPB 750 mg/150 ml premix (not administered)  ibuprofen (ADVIL,MOTRIN) tablet 200 mg (not administered)  nicotine (NICODERM CQ - dosed in mg/24 hours) patch 21 mg (not administered)  sodium chloride 0.9 % bolus 1,000 mL (1,000 mLs Intravenous New Bag/Given 12/14/16 1654)  ketorolac (TORADOL) 15 MG/ML injection 15 mg (15 mg Intravenous Given 12/14/16 1805)  vancomycin (VANCOCIN) IVPB 1000 mg/200 mL premix (0 mg Intravenous Stopped 12/14/16 2003)     Initial Impression / Assessment and Plan / ED Course  I have reviewed the triage vital signs and the nursing notes.  Pertinent labs & imaging results that were available during my care of the patient were reviewed by me and considered in my medical decision making (see chart for details).      Final Clinical Impressions(s) / ED Diagnoses   Final diagnoses:  Cellulitis of right ankle    Labs: Lactic acid, CMP and CBC  Imaging: DG ankle complete right  Consults:  Therapeutics: Toradol, vancomycin  Discharge Meds:   Assessment/Plan: 56 year old male presents today with cellulitis to his right lower extremity.  Patient has a history of the same, he is an IV drug user.  No drainable fluid collection at this time.  He is well-appearing afebrile non-tachycardic.  He has slight elevation in his lactic acid, doubt sepsis in this patient.  Blood cultures will be drawn, antibiotics will be given.  Try consulted for hospital admission.    New Prescriptions New Prescriptions   No medications on file     Rosalio Loud 12/14/16 2017    Arby Barrette, MD 12/26/16 [phone]

## 2016-12-14 NOTE — ED Notes (Signed)
Attempted report x1. 

## 2016-12-14 NOTE — ED Notes (Addendum)
Attempted report x 3. Will give bedside.

## 2016-12-14 NOTE — ED Notes (Signed)
IV team unable to gain IV access, Isaacs, MD aware, second IV RN at bedside

## 2016-12-14 NOTE — Progress Notes (Signed)
Pharmacy Antibiotic Note  Raymond ButteryGary Recinos is a 56 y.o. male admitted on 12/14/2016 with cellulitis.  Pharmacy has been consulted for vancomycin and zosyn dosing. Patient is afebrile, WBC normal, and LA elevated at 2.5. SCr 1.23 for estimated CrCl ~ 45-50 mL/min.   Plan: Vancomycin 1g IV x1, then 750 mg IV q24hr  Zosyn 3.375 IV q8hr  [facility] and [facility] needed (goal 10-15 mcg/mL) Monitor renal function, clinical picture, and cultures F/u length of therapy   Height: 5\' 7"  (170.2 cm) Weight: 108 lb (49 kg) IBW/kg (Calculated) : 66.1  Temp (24hrs), Avg:97.7 F (36.5 C), Min:97.7 F (36.5 C), Max:97.7 F (36.5 C)   Recent Labs Lab 12/14/16 1232 12/14/16 1248  WBC 4.5  --   CREATININE 1.23  --   LATICACIDVEN  --  2.50*    Estimated Creatinine Clearance: 45.9 mL/min (by C-G formula based on SCr of 1.23 mg/dL).    Allergies  Allergen Reactions  . Ativan [Lorazepam] Other (See Comments)    HALLUCINATIONS    Antimicrobials this admission: 6/25 Vanc >>  6/25 Zosyn >>   Dose adjustments this admission: n/a   Microbiology results: pending  York CeriseKatherine Cook, PharmD Pharmacy Resident  Pager [phone] 12/14/16 7:25 PM

## 2016-12-15 ENCOUNTER — Encounter (HOSPITAL_COMMUNITY): Payer: Self-pay | Admitting: *Deleted

## 2016-12-15 DIAGNOSIS — E11649 Type 2 diabetes mellitus with hypoglycemia without coma: Secondary | ICD-10-CM | POA: Diagnosis present

## 2016-12-15 DIAGNOSIS — E872 Acidosis: Secondary | ICD-10-CM | POA: Diagnosis present

## 2016-12-15 DIAGNOSIS — M869 Osteomyelitis, unspecified: Secondary | ICD-10-CM | POA: Diagnosis present

## 2016-12-15 DIAGNOSIS — F1110 Opioid abuse, uncomplicated: Secondary | ICD-10-CM | POA: Diagnosis present

## 2016-12-15 DIAGNOSIS — E875 Hyperkalemia: Secondary | ICD-10-CM | POA: Diagnosis present

## 2016-12-15 DIAGNOSIS — N179 Acute kidney failure, unspecified: Secondary | ICD-10-CM | POA: Diagnosis present

## 2016-12-15 DIAGNOSIS — N182 Chronic kidney disease, stage 2 (mild): Secondary | ICD-10-CM | POA: Diagnosis not present

## 2016-12-15 DIAGNOSIS — E1169 Type 2 diabetes mellitus with other specified complication: Secondary | ICD-10-CM | POA: Diagnosis present

## 2016-12-15 DIAGNOSIS — E876 Hypokalemia: Secondary | ICD-10-CM | POA: Diagnosis not present

## 2016-12-15 DIAGNOSIS — M7989 Other specified soft tissue disorders: Secondary | ICD-10-CM | POA: Diagnosis not present

## 2016-12-15 DIAGNOSIS — I959 Hypotension, unspecified: Secondary | ICD-10-CM | POA: Diagnosis present

## 2016-12-15 DIAGNOSIS — M25571 Pain in right ankle and joints of right foot: Secondary | ICD-10-CM | POA: Diagnosis not present

## 2016-12-15 DIAGNOSIS — Z888 Allergy status to other drugs, medicaments and biological substances status: Secondary | ICD-10-CM | POA: Diagnosis not present

## 2016-12-15 DIAGNOSIS — E639 Nutritional deficiency, unspecified: Secondary | ICD-10-CM | POA: Diagnosis present

## 2016-12-15 DIAGNOSIS — F1010 Alcohol abuse, uncomplicated: Secondary | ICD-10-CM | POA: Diagnosis present

## 2016-12-15 DIAGNOSIS — E1165 Type 2 diabetes mellitus with hyperglycemia: Secondary | ICD-10-CM | POA: Diagnosis present

## 2016-12-15 DIAGNOSIS — Z794 Long term (current) use of insulin: Secondary | ICD-10-CM | POA: Diagnosis not present

## 2016-12-15 DIAGNOSIS — K5900 Constipation, unspecified: Secondary | ICD-10-CM | POA: Diagnosis present

## 2016-12-15 DIAGNOSIS — L03119 Cellulitis of unspecified part of limb: Secondary | ICD-10-CM | POA: Diagnosis present

## 2016-12-15 DIAGNOSIS — E46 Unspecified protein-calorie malnutrition: Secondary | ICD-10-CM | POA: Diagnosis not present

## 2016-12-15 DIAGNOSIS — E1142 Type 2 diabetes mellitus with diabetic polyneuropathy: Secondary | ICD-10-CM | POA: Diagnosis present

## 2016-12-15 DIAGNOSIS — A419 Sepsis, unspecified organism: Secondary | ICD-10-CM | POA: Diagnosis present

## 2016-12-15 DIAGNOSIS — Z79899 Other long term (current) drug therapy: Secondary | ICD-10-CM | POA: Diagnosis not present

## 2016-12-15 DIAGNOSIS — E1122 Type 2 diabetes mellitus with diabetic chronic kidney disease: Secondary | ICD-10-CM | POA: Diagnosis not present

## 2016-12-15 DIAGNOSIS — L03115 Cellulitis of right lower limb: Secondary | ICD-10-CM | POA: Diagnosis not present

## 2016-12-15 DIAGNOSIS — F1020 Alcohol dependence, uncomplicated: Secondary | ICD-10-CM | POA: Diagnosis present

## 2016-12-15 DIAGNOSIS — F329 Major depressive disorder, single episode, unspecified: Secondary | ICD-10-CM | POA: Diagnosis present

## 2016-12-15 LAB — GLUCOSE, CAPILLARY
GLUCOSE-CAPILLARY: 114 mg/dL — AB (ref 65–99)
GLUCOSE-CAPILLARY: 138 mg/dL — AB (ref 65–99)
GLUCOSE-CAPILLARY: 154 mg/dL — AB (ref 65–99)
GLUCOSE-CAPILLARY: 332 mg/dL — AB (ref 65–99)
GLUCOSE-CAPILLARY: 545 mg/dL — AB (ref 65–99)
Glucose-Capillary: 117 mg/dL — ABNORMAL HIGH (ref 65–99)
Glucose-Capillary: 14 mg/dL — CL (ref 65–99)
Glucose-Capillary: 34 mg/dL — CL (ref 65–99)
Glucose-Capillary: 73 mg/dL (ref 65–99)
Glucose-Capillary: 193 mg/dL — ABNORMAL HIGH (ref 65–99)

## 2016-12-15 LAB — BASIC METABOLIC PANEL
Anion gap: 5 (ref 5–15)
BUN: 13 mg/dL (ref 6–20)
CALCIUM: 7.8 mg/dL — AB (ref 8.9–10.3)
CO2: 20 mmol/L — ABNORMAL LOW (ref 22–32)
CREATININE: 1.08 mg/dL (ref 0.61–1.24)
Chloride: 116 mmol/L — ABNORMAL HIGH (ref 101–111)
GFR calc Af Amer: >60 mL/min (ref >60)
Glucose, Bld: 36 mg/dL — CL (ref 65–99)
POTASSIUM: 3.1 mmol/L — AB (ref 3.5–5.1)
SODIUM: 141 mmol/L (ref 135–145)

## 2016-12-15 LAB — CBC
HEMATOCRIT: 29.1 % — AB (ref 39.0–52.0)
Hemoglobin: 9.6 g/dL — ABNORMAL LOW (ref 13.0–17.0)
MCH: 24.4 pg — ABNORMAL LOW (ref 26.0–34.0)
MCHC: 32 g/dL (ref 30.0–36.0)
MCV: 76.4 fL — ABNORMAL LOW (ref 78.0–100.0)
PLATELETS: 206 10*3/uL (ref 150–400)
RBC: 3.81 MIL/uL — ABNORMAL LOW (ref 4.22–5.81)
RDW: 13.8 % (ref 11.5–15.5)
WBC: 4.1 10*3/uL (ref 4.0–10.5)

## 2016-12-15 LAB — MAGNESIUM: MAGNESIUM: 1.8 mg/dL (ref 1.7–2.4)

## 2016-12-15 LAB — HEPATIC FUNCTION PANEL
ALK PHOS: 87 U/L (ref 38–126)
ALT: 21 U/L (ref 17–63)
AST: 50 U/L — ABNORMAL HIGH (ref 15–41)
Albumin: 2.2 g/dL — ABNORMAL LOW (ref 3.5–5.0)
Total Bilirubin: 0.5 mg/dL (ref 0.3–1.2)
Total Protein: 5.3 g/dL — ABNORMAL LOW (ref 6.5–8.1)

## 2016-12-15 LAB — LACTIC ACID, PLASMA
LACTIC ACID, VENOUS: 1.9 mmol/L (ref 0.5–1.9)
Lactic Acid, Venous: 3.6 mmol/L (ref 0.5–1.9)

## 2016-12-15 MED ORDER — INSULIN GLARGINE 100 UNIT/ML ~~LOC~~ SOLN
25.0000 [IU] | Freq: Every day | SUBCUTANEOUS | Status: DC
  Administered 2016-12-15 – 2016-12-16 (×2): 25 [IU] via SUBCUTANEOUS
  Filled 2016-12-15 (×2): qty 0.25

## 2016-12-15 MED ORDER — POTASSIUM CHLORIDE CRYS ER 20 MEQ PO TBCR
40.0000 meq | EXTENDED_RELEASE_TABLET | Freq: Once | ORAL | Status: AC
  Administered 2016-12-15: 40 meq via ORAL
  Filled 2016-12-15: qty 2

## 2016-12-15 MED ORDER — DEXTROSE 50 % IV SOLN
1.0000 | Freq: Once | INTRAVENOUS | Status: AC
Start: 2016-12-15 — End: 2016-12-15
  Administered 2016-12-15: 50 mL via INTRAVENOUS
  Filled 2016-12-15: qty 50

## 2016-12-15 MED ORDER — DEXTROSE 50 % IV SOLN
INTRAVENOUS | Status: AC
Start: 2016-12-15 — End: 2016-12-15
  Administered 2016-12-15: 50 mL
  Filled 2016-12-15: qty 50

## 2016-12-15 MED ORDER — INSULIN ASPART 100 UNIT/ML ~~LOC~~ SOLN
10.0000 [IU] | Freq: Once | SUBCUTANEOUS | Status: AC
  Administered 2016-12-15: 10 [IU] via SUBCUTANEOUS

## 2016-12-15 MED ORDER — DEXTROSE 50 % IV SOLN
INTRAVENOUS | Status: AC
  Administered 2016-12-15: 50 mL
  Filled 2016-12-15: qty 50

## 2016-12-15 MED ORDER — SODIUM CHLORIDE 0.9 % IV SOLN
INTRAVENOUS | Status: DC
  Administered 2016-12-15 – 2016-12-16 (×2): via INTRAVENOUS
  Filled 2016-12-15 (×3): qty 1000

## 2016-12-15 MED ORDER — SODIUM CHLORIDE 0.9 % IV BOLUS (SEPSIS)
500.0000 mL | Freq: Once | INTRAVENOUS | Status: AC
  Administered 2016-12-15: 500 mL via INTRAVENOUS

## 2016-12-15 NOTE — Progress Notes (Signed)
Hypoglycemic Event  CBG:36  Treatment: amp Dextrose 50ml  Symptoms: sweating weakness  Follow-up CBG: [id number] CBG Result:154  Possible Reasons for Event: unknown  Comments/MD notified:yes    Manson PasseyBrown, Boykin PeekVeronica Scott

## 2016-12-15 NOTE — Progress Notes (Signed)
Dr. Susie CassetteAbrol text paged about patient's blood sugar dropping.

## 2016-12-15 NOTE — Progress Notes (Signed)
[facility] PROGRESS NOTE  Raymond Winters:[id number] DOB: 09-17-1960 DOA: 12/14/2016   PCP: Arnoldo Morale, MD     Assessment/Plan: Principal Problem:   Cellulitis of right ankle Active Problems:   Heroin abuse   Alcohol abuse   Tobacco abuse   Type 2 diabetes mellitus with complication, with long-term current use of insulin (HCC)   Lactic acid increased, CHRONIC   Depression   Cellulitis of foot, right   Chronic kidney disease (CKD), stage II (mild)   Ankle cellulitis     56 y.o. male with medical history significant of IVDU,  hyperlipidemia, diabetes mellitus, depression, hearing abuse, tobacco abuse, alcohol abuse, alcoholic liver disease, chronic elevation of lactic acid, CKD-II, who presents with right ankle and foot pain. Patient admitted for cellulitis of the right ankle  Assessment and plan  Cellulitis of right ankle and foot:  Without sepsis,  lactic acidosis has resolved. Pt failed outpatient oral antibiotic treatment. - Now on vancomycin and Zosyn, day #2 -- PRN Zofran for nausea, Percocet for pain (avoid IV narcotics due to hx of IVDU) - Blood cultures x 2  - ESR 38 - Pro calcitonin  negative - Continue IV fluids May need MRI to further define abscess versus cellulitis   Hx of polysubstance abuse: UDS positive for cocaine and opiates, history of IV drug use  including alcohol,  Last drining of alcohol was 4 days ago, no signs of withdraw. -Nicotine patch  Check HIV, patient has a history of hepatitis C  Depression Stable, no suicidal or homicidal ideations. -Continue home medications: Mirtazapine  HLD: -Lipitor  DM-II: Last A1c 13.2 on 12/09/15, poorly controled CBG greater than 500 this admission. Apparently noncompliant Patient is taking NovoLog and the glargine insulin at home Started on Lantus 28 units -SSI  Hypokalemia Replete  Chronic kidney disease (CKD), stage II (mild): stable. Baseline creatinine 1.1-1.3. His creatinine is  1.23 on admission. Follow-up renal function by BMP     DVT prophylaxsis Lovenox  Code Status:  Full code     Family Communication: Discussed in detail with the patient, all imaging results, lab results explained to the patient   Disposition Plan:  1-2 days      Consultants:  None  Procedures:  None  Antibiotics: Anti-infectives    Start     Dose/Rate Route Frequency Ordered Stop   12/15/16 2000  vancomycin (VANCOCIN) IVPB 750 mg/150 ml premix     750 mg 150 mL/hr over 60 Minutes Intravenous Every 24 hours 12/14/16 1927     12/15/16 0000  piperacillin-tazobactam (ZOSYN) IVPB 3.375 g     3.375 g 12.5 mL/hr over 240 Minutes Intravenous Every 8 hours 12/14/16 2328     12/14/16 2000  piperacillin-tazobactam (ZOSYN) IVPB 3.375 g  Status:  Discontinued     3.375 g 12.5 mL/hr over 240 Minutes Intravenous Every 8 hours 12/14/16 1927 12/14/16 2328   12/14/16 1815  vancomycin (VANCOCIN) IVPB 1000 mg/200 mL premix     1,000 mg 200 mL/hr over 60 Minutes Intravenous  Once 12/14/16 1800 12/14/16 2003         HPI/Subjective: Afebrile overnight, still complains of right ankle pain with ROM  Objective: Vitals:   12/14/16 1726 12/14/16 1920 12/14/16 2100 12/15/16 0500  BP:  (!) 156/81 128/79 137/84  Pulse: 68 (!) 58 (!) 57 60  Resp: '16 18 16 16  '$ Temp:   98.9 F (37.2 C) 98 F (36.7 C)  TempSrc:   Oral  Oral  SpO2:  100% 100% 100%  Weight:      Height:        Intake/Output Summary (Last 24 hours) at 12/15/16 5784 Last data filed at 12/15/16 0006  Gross per 24 hour  Intake              200 ml  Output              300 ml  Net             -100 ml    Exam:  Examination:  General exam: Appears calm and comfortable  Respiratory system: Clear to auscultation. Respiratory effort normal. Cardiovascular system: S1 & S2 heard, RRR. No JVD, murmurs, rubs, gallops or clicks. No pedal edema. Gastrointestinal system: Abdomen is nondistended, soft and nontender. No  organomegaly or masses felt. Normal bowel sounds heard. Central nervous system: Alert and oriented. No focal neurological deficits. Extremities:   There is an injection mark in the medial side of right ankle. There is swelling to right ankle and proximal foot, with tenderness and mild warmth. No fluctuance or purulence Skin: No rashes, lesions or ulcers Psychiatry: Judgement and insight appear normal. Mood & affect appropriate.     Data Reviewed: I have personally reviewed following labs and imaging studies  Micro Results No results found for this or any previous visit (from the past 240 hour(s)).  Radiology Reports Dg Ankle Complete Right  Result Date: 12/14/2016 CLINICAL DATA:  Right ankle pain and swelling with probable infection. EXAM: RIGHT ANKLE - COMPLETE 3+ VIEW COMPARISON:  12/08/2016 FINDINGS: There is no evidence of acute fracture, subluxation or dislocation. No radiographic evidence of osteomyelitis noted. Mild soft tissue swelling noted. IMPRESSION: Mild soft tissue swelling without acute bony abnormality. Electronically Signed   By: Margarette Canada M.D.   On: 12/14/2016 18:37   Dg Ankle Complete Right  Result Date: 12/08/2016 CLINICAL DATA:  Right ankle pain and swelling, initial encounter EXAM: RIGHT ANKLE - COMPLETE 3+ VIEW COMPARISON:  None. FINDINGS: There is no evidence of fracture, dislocation, or joint effusion. There is no evidence of arthropathy or other focal bone abnormality. Soft tissues are unremarkable. IMPRESSION: No acute abnormality noted Electronically Signed   By: Inez Catalina M.D.   On: 12/08/2016 16:24     CBC  Recent Labs Lab 12/14/16 1232 12/15/16 0340  WBC 4.5 4.1  HGB 12.5* 9.6*  HCT 38.2* 29.1*  PLT 271 206  MCV 76.9* 76.4*  MCH 25.2* 24.4*  MCHC 32.7 32.0  RDW 13.9 13.8  LYMPHSABS 1.8  --   MONOABS 0.4  --   EOSABS 0.0  --   BASOSABS 0.0  --     Chemistries   Recent Labs Lab 12/14/16 1232 12/14/16 2159 12/15/16 0340  NA 135 133*  141  K 4.8 4.7 3.1*  CL 103 104 116*  CO2 21* 23 20*  GLUCOSE 302* 661* 36*  BUN '12 15 13  '$ CREATININE 1.23 1.30* 1.08  CALCIUM 8.8* 7.8* 7.8*  AST 77*  --   --   ALT 30  --   --   ALKPHOS 112  --   --   BILITOT 0.5  --   --    ------------------------------------------------------------------------------------------------------------------ estimated creatinine clearance is 52.3 mL/min (by C-G formula based on SCr of 1.08 mg/dL). ------------------------------------------------------------------------------------------------------------------ No results for input(s): HGBA1C in the last 72 hours. ------------------------------------------------------------------------------------------------------------------ No results for input(s): CHOL, HDL, LDLCALC, TRIG, CHOLHDL, LDLDIRECT in the last 72 hours. ------------------------------------------------------------------------------------------------------------------ No results  for input(s): TSH, T4TOTAL, T3FREE, THYROIDAB in the last 72 hours.  Invalid input(s): FREET3 ------------------------------------------------------------------------------------------------------------------ No results for input(s): VITAMINB12, FOLATE, FERRITIN, TIBC, IRON, RETICCTPCT in the last 72 hours.  Coagulation profile No results for input(s): INR, PROTIME in the last 168 hours.  No results for input(s): DDIMER in the last 72 hours.  Cardiac Enzymes No results for input(s): CKMB, TROPONINI, MYOGLOBIN in the last 168 hours.  Invalid input(s): CK ------------------------------------------------------------------------------------------------------------------ Invalid input(s): POCBNP   CBG:  Recent Labs Lab 12/14/16 2141 12/15/16 0035 12/15/16 0131 12/15/16 0239 12/15/16 0516  GLUCAP 598* 545* 332* 117* 154*       Studies: Dg Ankle Complete Right  Result Date: 12/14/2016 CLINICAL DATA:  Right ankle pain and swelling with probable  infection. EXAM: RIGHT ANKLE - COMPLETE 3+ VIEW COMPARISON:  12/08/2016 FINDINGS: There is no evidence of acute fracture, subluxation or dislocation. No radiographic evidence of osteomyelitis noted. Mild soft tissue swelling noted. IMPRESSION: Mild soft tissue swelling without acute bony abnormality. Electronically Signed   By: Margarette Canada M.D.   On: 12/14/2016 18:37      Lab Results  Component Value Date   HGBA1C 13.2 12/08/2016   HGBA1C 10.9 (H) 08/22/2016   HGBA1C 7.8 (H) 06/23/2016   Lab Results  Component Value Date   MICROALBUR 2.3 06/29/2016   LDLCALC 16 01/03/2014   CREATININE 1.08 12/15/2016       Scheduled Meds: . aspirin EC  81 mg Oral Daily  . atorvastatin  40 mg Oral Daily  . dextrose  1 ampule Intravenous Once  . enoxaparin (LOVENOX) injection  40 mg Subcutaneous Q24H  . gabapentin  600 mg Oral QHS  . insulin aspart  0-9 Units Subcutaneous TID WC  . insulin glargine  28 Units Subcutaneous QHS  . lipase/protease/amylase  24,000 Units Oral TID AC  . mirtazapine  30 mg Oral QHS  . multivitamin with minerals  1 tablet Oral Daily  . nicotine  21 mg Transdermal Daily   Continuous Infusions: . piperacillin-tazobactam (ZOSYN)  IV 3.375 g (12/15/16 0000)  . 0.9 % sodium chloride with kcl    . vancomycin       LOS: 0 days    Time spent: >30 MINS    Reyne Dumas  [facility] Pager [phone]. If 7PM-7AM, please contact night-coverage at [email or web address], password [facility] 12/15/2016, 8:38 AM  LOS: 0 days

## 2016-12-15 NOTE — Progress Notes (Signed)
Inpatient Diabetes Program Recommendations  AACE/ADA: New Consensus Statement on Inpatient Glycemic Control (2015)  Target Ranges:  Prepandial:   less than 140 mg/dL      Peak postprandial:   less than 180 mg/dL (1-2 hours)      Critically ill patients:  140 - 180 mg/dL   Results for Larinda ButteryMIX, Mung (MRN [id number]) as of 12/15/2016 13:22  Ref. Range 12/14/2016 21:41 12/15/2016 00:35 12/15/2016 01:31 12/15/2016 02:39 12/15/2016 05:16 12/15/2016 11:37 12/15/2016 12:11 12/15/2016 12:40  Glucose-Capillary Latest Ref Range: 65 - 99 mg/dL 409598 (HH) 811545 (HH)  15 units Novolog + 28 units Lantus 332 (H)  10 units Novolog 117 (H) 154 (H)  2 units Novolog 14 (LL) 34 (LL) 73    Home DM Meds: Basaglar 35 units QHS       Novolog 4-12 units TID per SSI  Current Insulin Orders: Lantus 28 units QHS      Novolog Sensitive Correction Scale/ SSI (0-9 units) TID AC      MD- Note patient with extreme hyperglycemia on admission (CBG 598 mg/dl).  Was given total of 25 units Novolog and 28 units Lantus last PM.  Severe Hypoglycemia at 12pm today likely from all the Novolog he received at midnight.    Please consider reducing Lantus to 20 units QHS (0.4 units/kg based on weight of 49 kg)     Spoke with patient about his current A1c of 13.2%.  Explained what an A1c is and what it measures.  Reminded patient that his goal A1c is 7% or less per ADA standards to prevent both acute and long-term complications.  Explained to patient the extreme importance of good glucose control at home.  Encouraged patient to check his CBGs at least tid at home and to record all CBGs in a logbook for his PCP to review.  Patient told me [facility].  Uses the pharmacy there as well.  Has Basaglar and Novolog insulin at home.  Did not have any questions for me at this time.  Sister of patient at bedside told me that the doctor labeled pt as "brittle" and that he needs his CBGs checked  frequently.  Assured pt and sister that we will be checking pt's CBGs at least QID here in hospital and that if pt suspects he is having a low that he should call the RN immediately so we can check his CBG right away.  Patient stated he drinks juice when he has a low at home.  Stated he still gets symptoms when he is having a low.     --Will follow patient during hospitalization--  Ambrose FinlandJeannine Johnston Esme Durkin RN, MSN, CDE Diabetes Coordinator Inpatient Glycemic Control Team Team Pager: [phone] (8a-5p)

## 2016-12-16 ENCOUNTER — Inpatient Hospital Stay (HOSPITAL_COMMUNITY): Payer: Medicare Other

## 2016-12-16 LAB — COMPREHENSIVE METABOLIC PANEL
ALT: 26 U/L (ref 17–63)
AST: 51 U/L — AB (ref 15–41)
Albumin: 2.2 g/dL — ABNORMAL LOW (ref 3.5–5.0)
Alkaline Phosphatase: 98 U/L (ref 38–126)
Anion gap: 5 (ref 5–15)
BUN: 10 mg/dL (ref 6–20)
CHLORIDE: 114 mmol/L — AB (ref 101–111)
CO2: 22 mmol/L (ref 22–32)
CREATININE: 1.02 mg/dL (ref 0.61–1.24)
Calcium: 8.1 mg/dL — ABNORMAL LOW (ref 8.9–10.3)
GFR calc non Af Amer: >60 mL/min (ref >60)
Glucose, Bld: 78 mg/dL (ref 65–99)
POTASSIUM: 5.2 mmol/L — AB (ref 3.5–5.1)
SODIUM: 141 mmol/L (ref 135–145)
Total Bilirubin: 0.4 mg/dL (ref 0.3–1.2)
Total Protein: 5.7 g/dL — ABNORMAL LOW (ref 6.5–8.1)

## 2016-12-16 LAB — CBC
HEMATOCRIT: 33.9 % — AB (ref 39.0–52.0)
Hemoglobin: 10.8 g/dL — ABNORMAL LOW (ref 13.0–17.0)
MCH: 24.5 pg — AB (ref 26.0–34.0)
MCHC: 31.9 g/dL (ref 30.0–36.0)
MCV: 76.9 fL — AB (ref 78.0–100.0)
PLATELETS: 218 10*3/uL (ref 150–400)
RBC: 4.41 MIL/uL (ref 4.22–5.81)
RDW: 13.9 % (ref 11.5–15.5)
WBC: 6 10*3/uL (ref 4.0–10.5)

## 2016-12-16 LAB — GLUCOSE, CAPILLARY
GLUCOSE-CAPILLARY: 199 mg/dL — AB (ref 65–99)
GLUCOSE-CAPILLARY: 205 mg/dL — AB (ref 65–99)
GLUCOSE-CAPILLARY: 210 mg/dL — AB (ref 65–99)
GLUCOSE-CAPILLARY: 98 mg/dL (ref 65–99)
Glucose-Capillary: 142 mg/dL — ABNORMAL HIGH (ref 65–99)
Glucose-Capillary: 55 mg/dL — ABNORMAL LOW (ref 65–99)
Glucose-Capillary: 50 mg/dL — ABNORMAL LOW (ref 65–99)
Glucose-Capillary: 62 mg/dL — ABNORMAL LOW (ref 65–99)

## 2016-12-16 LAB — URIC ACID: Uric Acid, Serum: 2.8 mg/dL — ABNORMAL LOW (ref 4.4–7.6)

## 2016-12-16 MED ORDER — OXYCODONE HCL 5 MG PO TABS
10.0000 mg | ORAL_TABLET | ORAL | Status: DC | PRN
  Administered 2016-12-16 – 2016-12-19 (×15): 10 mg via ORAL
  Filled 2016-12-16 (×15): qty 2

## 2016-12-16 NOTE — Evaluation (Signed)
Physical Therapy Evaluation Patient Details Name: Raymond ButteryGary Winters MRN: [id number] DOB: 07-Nov-1960 Today's Date: 12/16/2016   History of Present Illness  Pt is a 56 y/o M with cellulitis of R ankle. PMH of HLD, DM, depression, alcohol abuse, CKD, liver disease, Hep C.   Clinical Impression  Pt is pleasant and agreeable to get OOB and mobilize with PT; however states that he would not like to walk far d/t pain in his Rt ankle. Pt states he recently got OOB on his own to go to the restroom using the IV pole to "keep stable." Pt observed to have some impulsive behavior with transfers, as he tries to get OOB on his own and needs several reminders to wait for help. Pt is able to ambulate with straight cane with min guard; however needs reinforcement on proper use for safe return home. Pt will benefit from continued acute therapy for safety awareness, LE strengthening and mobility. Pt is functioning near baseline with limitation in ambulation d/t R ankle pain and swelling.      Follow Up Recommendations No PT follow up;Supervision - Intermittent    Equipment Recommendations  None recommended by PT    Recommendations for Other Services       Precautions / Restrictions Restrictions Weight Bearing Restrictions: No      Mobility  Bed Mobility Overal bed mobility: Needs Assistance Bed Mobility: Supine to Sit     Supine to sit: Min guard     General bed mobility comments: Min guard for safety  Transfers Overall transfer level: Needs assistance Equipment used: Straight cane Transfers: Sit to/from Stand Sit to Stand: Min guard         General transfer comment: Pt requires min guard for safety   Ambulation/Gait Ambulation/Gait assistance: Min guard Ambulation Distance (Feet): 20 Feet (Pt ambulated to bathroom and to chair) Assistive device: Straight cane Gait Pattern/deviations: Decreased stride length     General Gait Details: Pt holds cane while ambulating, however he will need  reinforcement on proper use Pt not receptive to VCs for use of cane.   Stairs            Wheelchair Mobility    Modified Rankin (Stroke Patients Only)       Balance Overall balance assessment: Needs assistance Sitting-balance support: Feet supported Sitting balance-Leahy Scale: Poor Sitting balance - Comments: Pt able to sit on EOB with B feet supported; however needs supervision for safety   Standing balance support: No upper extremity supported;During functional activity Standing balance-Leahy Scale: Fair Standing balance comment: Pt ablle to stand without support at toilet with min guard. However, pt states he requires use of cane for balance. States he has been needing to use the IV pole for stability during standing when not using cane                              Pertinent Vitals/Pain Pain Assessment: 0-10 Pain Score: 7  Pain Location: R ankle  Pain Descriptors / Indicators: Discomfort Pain Intervention(s): Monitored during session;Repositioned    Home Living Family/patient expects to be discharged to:: Private residence Living Arrangements: Other relatives (Brother) Available Help at Discharge: Family (Brother available 24/7) Type of Home: Apartment Home Access: Stairs to enter Entrance Stairs-Rails: None Entrance Stairs-Number of Steps: 1 Home Layout: One level Home Equipment: Grab bars - tub/shower      Prior Function Level of Independence: Independent  Hand Dominance        Extremity/Trunk Assessment   Upper Extremity Assessment Upper Extremity Assessment: Overall WFL for tasks assessed    Lower Extremity Assessment Lower Extremity Assessment: Generalized weakness    Cervical / Trunk Assessment Cervical / Trunk Assessment: Normal  Communication   Communication: No difficulties  Cognition Arousal/Alertness: Awake/alert Behavior During Therapy: Impulsive;Flat affect Overall Cognitive Status: Within Functional  Limits for tasks assessed (Pt with impulsive behavior with reminders to not get OOB without help)                                        General Comments      Exercises General Exercises - Lower Extremity Ankle Circles/Pumps: AROM;Right;10 reps Long Arc Quad: AROM;Right;10 reps Hip Flexion/Marching: AROM;Both;10 reps   Assessment/Plan    PT Assessment Patient needs continued PT services  PT Problem List Decreased strength;Decreased mobility;Decreased cognition       PT Treatment Interventions Gait training;Therapeutic exercise (Safety awareness)    PT Goals (Current goals can be found in the Care Plan section)       Frequency Min 2X/week   Barriers to discharge        Co-evaluation               AM-PAC PT "6 Clicks" Daily Activity  Outcome Measure Difficulty turning over in bed (including adjusting bedclothes, sheets and blankets)?: A Little Difficulty moving from lying on back to sitting on the side of the bed? : A Little Difficulty sitting down on and standing up from a chair with arms (e.g., wheelchair, bedside commode, etc,.)?: A Little Help needed moving to and from a bed to chair (including a wheelchair)?: A Little Help needed walking in hospital room?: A Little Help needed climbing 3-5 steps with a railing? : A Lot 6 Click Score: 17    End of Session Equipment Utilized During Treatment: Gait belt Activity Tolerance: Patient tolerated treatment well Patient left: in chair;with call bell/phone within reach Nurse Communication: Mobility status PT Visit Diagnosis: Other abnormalities of gait and mobility (R26.89);Muscle weakness (generalized) (M62.81)    Time:  -      Charges:         PT G Codes:        Arneta Cliche, SPT Acute Rehab [phone] 12/16/2016, 2:48 PM

## 2016-12-16 NOTE — Progress Notes (Signed)
[facility] PROGRESS NOTE  Raymond Winters MHD:[id number] DOB: 07-16-1960 DOA: 12/14/2016   PCP: Arnoldo Morale, MD     Assessment/Plan: Principal Problem:   Cellulitis of right ankle Active Problems:   Heroin abuse   Alcohol abuse   Tobacco abuse   Type 2 diabetes mellitus with complication, with long-term current use of insulin (HCC)   Lactic acid increased, CHRONIC   Depression   Cellulitis of foot, right   Chronic kidney disease (CKD), stage II (mild)   Ankle cellulitis     56 y.o. male with medical history significant of IVDU,  hyperlipidemia, diabetes mellitus, depression, hearing abuse, tobacco abuse, alcohol abuse, alcoholic liver disease, chronic elevation of lactic acid, CKD-II, who presents with right ankle and foot pain. Patient admitted for cellulitis of the right ankle  Assessment and plan Cellulitis of right ankle and foot:  Without sepsis,  lactic acidosis has resolved. Pt failed outpatient oral antibiotic treatment. Now on vancomycin and Zosyn, day #3 Patient has a history of IV drug use and injecting himself and his foot with IV heroin  Blood cultures x 2 , no growth so far - ESR 38 - Pro calcitonin  negative Uric acid 2.8 PT OT eval to see if patient can ambulate Adjusted pain medication, patient requesting IV Dilaudid, that was declined, started on oxycodone 10 mg every 4 as needed   Hx of polysubstance abuse: UDS positive for cocaine and opiates, history of IV drug use including alcohol,  Last drink of alcohol was 4 days prior to admission, no signs of withdraw. Nicotine patch Check HIV, patient has a history of hepatitis C   Depression Stable, no suicidal or homicidal ideations. -Continue home medications: Mirtazapine  HLD: -Lipitor  DM-II:  hypoglycemic yesterday, after receiving  25 units Novolog and 28 units Lantus for a CBG of 598 Last A1c 13.2 on 12/08/16, poorly controled Patient is taking NovoLog and the glargine insulin at  home Dose of Lantus reduced to 25 units Continuous sliding scale insulin  hypokalemia Repleted  Chronic kidney disease (CKD), stage II (mild): stable. Baseline creatinine 1.1-1.3. His creatinine is stable, 1.23 >1.02      DVT prophylaxsis Lovenox  Code Status:  Full code     Family Communication: Discussed in detail with the patient, all imaging results, lab results explained to the patient   Disposition Plan:    likely tomorrow      Consultants:  None  Procedures:  None  Antibiotics: Anti-infectives    Start     Dose/Rate Route Frequency Ordered Stop   12/15/16 2000  vancomycin (VANCOCIN) IVPB 750 mg/150 ml premix     750 mg 150 mL/hr over 60 Minutes Intravenous Every 24 hours 12/14/16 1927     12/15/16 0000  piperacillin-tazobactam (ZOSYN) IVPB 3.375 g     3.375 g 12.5 mL/hr over 240 Minutes Intravenous Every 8 hours 12/14/16 2328     12/14/16 2000  piperacillin-tazobactam (ZOSYN) IVPB 3.375 g  Status:  Discontinued     3.375 g 12.5 mL/hr over 240 Minutes Intravenous Every 8 hours 12/14/16 1927 12/14/16 2328   12/14/16 1815  vancomycin (VANCOCIN) IVPB 1000 mg/200 mL premix     1,000 mg 200 mL/hr over 60 Minutes Intravenous  Once 12/14/16 1800 12/14/16 2003         HPI/Subjective: Continues to have pain with weightbearing  Objective: Vitals:   12/14/16 2100 12/15/16 0500 12/15/16 2030 12/16/16 0544  BP: 128/79 137/84 119/76 121/82  Pulse: (!) 57 60 79 74  Resp: '16 16 16 18  '$ Temp: 98.9 F (37.2 C) 98 F (36.7 C) 98.6 F (37 C) 99 F (37.2 C)  TempSrc: Oral Oral Oral Oral  SpO2: 100% 100% 100% 92%  Weight:      Height:        Intake/Output Summary (Last 24 hours) at 12/16/16 0838 Last data filed at 12/16/16 0500  Gross per 24 hour  Intake          1913.75 ml  Output              900 ml  Net          1013.75 ml    Exam:  Examination:  General exam: Appears calm and comfortable  Respiratory system: Clear to auscultation. Respiratory  effort normal. Cardiovascular system: S1 & S2 heard, RRR. No JVD, murmurs, rubs, gallops or clicks. No pedal edema. Gastrointestinal system: Abdomen is nondistended, soft and nontender. No organomegaly or masses felt. Normal bowel sounds heard. Central nervous system: Alert and oriented. No focal neurological deficits. Extremities:   There is an injection mark in the medial side of right ankle. There is swelling to right ankle and proximal foot, with tenderness and mild warmth. No fluctuance or purulence Skin: No rashes, lesions or ulcers Psychiatry: Judgement and insight appear normal. Mood & affect appropriate.     Data Reviewed: I have personally reviewed following labs and imaging studies  Micro Results Recent Results (from the past 240 hour(s))  Culture, blood (x 2)     Status: None (Preliminary result)   Collection Time: 12/14/16  7:50 PM  Result Value Ref Range Status   Specimen Description BLOOD RIGHT HAND  Final   Special Requests IN PEDIATRIC BOTTLE Blood Culture adequate volume  Final   Culture NO GROWTH < 24 HOURS  Final   Report Status PENDING  Incomplete  Culture, blood (x 2)     Status: None (Preliminary result)   Collection Time: 12/14/16  7:54 PM  Result Value Ref Range Status   Specimen Description BLOOD RIGHT HAND  Final   Special Requests   Final    BOTTLES DRAWN AEROBIC AND ANAEROBIC Blood Culture adequate volume   Culture NO GROWTH < 24 HOURS  Final   Report Status PENDING  Incomplete    Radiology Reports Dg Ankle Complete Right  Result Date: 12/14/2016 CLINICAL DATA:  Right ankle pain and swelling with probable infection. EXAM: RIGHT ANKLE - COMPLETE 3+ VIEW COMPARISON:  12/08/2016 FINDINGS: There is no evidence of acute fracture, subluxation or dislocation. No radiographic evidence of osteomyelitis noted. Mild soft tissue swelling noted. IMPRESSION: Mild soft tissue swelling without acute bony abnormality. Electronically Signed   By: Margarette Canada M.D.   On:  12/14/2016 18:37   Dg Ankle Complete Right  Result Date: 12/08/2016 CLINICAL DATA:  Right ankle pain and swelling, initial encounter EXAM: RIGHT ANKLE - COMPLETE 3+ VIEW COMPARISON:  None. FINDINGS: There is no evidence of fracture, dislocation, or joint effusion. There is no evidence of arthropathy or other focal bone abnormality. Soft tissues are unremarkable. IMPRESSION: No acute abnormality noted Electronically Signed   By: Inez Catalina M.D.   On: 12/08/2016 16:24     CBC  Recent Labs Lab 12/14/16 1232 12/15/16 0340 12/16/16 0600  WBC 4.5 4.1 6.0  HGB 12.5* 9.6* 10.8*  HCT 38.2* 29.1* 33.9*  PLT 271 206 218  MCV 76.9* 76.4* 76.9*  MCH 25.2* 24.4* 24.5*  MCHC  32.7 32.0 31.9  RDW 13.9 13.8 13.9  LYMPHSABS 1.8  --   --   MONOABS 0.4  --   --   EOSABS 0.0  --   --   BASOSABS 0.0  --   --     Chemistries   Recent Labs Lab 12/14/16 1232 12/14/16 2159 12/15/16 0340 12/15/16 0900 12/16/16 0600  NA 135 133* 141  --  141  K 4.8 4.7 3.1*  --  5.2*  CL 103 104 116*  --  114*  CO2 21* 23 20*  --  22  GLUCOSE 302* 661* 36*  --  78  BUN '12 15 13  '$ --  10  CREATININE 1.23 1.30* 1.08  --  1.02  CALCIUM 8.8* 7.8* 7.8*  --  8.1*  MG  --   --   --  1.8  --   AST 77*  --   --  50* 51*  ALT 30  --   --  21 26  ALKPHOS 112  --   --  87 98  BILITOT 0.5  --   --  0.5 0.4   ------------------------------------------------------------------------------------------------------------------ estimated creatinine clearance is 55.4 mL/min (by C-G formula based on SCr of 1.02 mg/dL). ------------------------------------------------------------------------------------------------------------------ No results for input(s): HGBA1C in the last 72 hours. ------------------------------------------------------------------------------------------------------------------ No results for input(s): CHOL, HDL, LDLCALC, TRIG, CHOLHDL, LDLDIRECT in the last 72  hours. ------------------------------------------------------------------------------------------------------------------ No results for input(s): TSH, T4TOTAL, T3FREE, THYROIDAB in the last 72 hours.  Invalid input(s): FREET3 ------------------------------------------------------------------------------------------------------------------ No results for input(s): VITAMINB12, FOLATE, FERRITIN, TIBC, IRON, RETICCTPCT in the last 72 hours.  Coagulation profile No results for input(s): INR, PROTIME in the last 168 hours.  No results for input(s): DDIMER in the last 72 hours.  Cardiac Enzymes No results for input(s): CKMB, TROPONINI, MYOGLOBIN in the last 168 hours.  Invalid input(s): CK ------------------------------------------------------------------------------------------------------------------ Invalid input(s): POCBNP   CBG:  Recent Labs Lab 12/15/16 1731 12/15/16 2033 12/16/16 0032 12/16/16 0539 12/16/16 0754  GLUCAP 138* 193* 205* 98 142*       Studies: Dg Ankle Complete Right  Result Date: 12/14/2016 CLINICAL DATA:  Right ankle pain and swelling with probable infection. EXAM: RIGHT ANKLE - COMPLETE 3+ VIEW COMPARISON:  12/08/2016 FINDINGS: There is no evidence of acute fracture, subluxation or dislocation. No radiographic evidence of osteomyelitis noted. Mild soft tissue swelling noted. IMPRESSION: Mild soft tissue swelling without acute bony abnormality. Electronically Signed   By: Margarette Canada M.D.   On: 12/14/2016 18:37      Lab Results  Component Value Date   HGBA1C 13.2 12/08/2016   HGBA1C 10.9 (H) 08/22/2016   HGBA1C 7.8 (H) 06/23/2016   Lab Results  Component Value Date   MICROALBUR 2.3 06/29/2016   LDLCALC 16 01/03/2014   CREATININE 1.02 12/16/2016       Scheduled Meds: . aspirin EC  81 mg Oral Daily  . atorvastatin  40 mg Oral Daily  . enoxaparin (LOVENOX) injection  40 mg Subcutaneous Q24H  . gabapentin  600 mg Oral QHS  . insulin aspart   0-9 Units Subcutaneous TID WC  . insulin glargine  25 Units Subcutaneous QHS  . lipase/protease/amylase  24,000 Units Oral TID AC  . mirtazapine  30 mg Oral QHS  . multivitamin with minerals  1 tablet Oral Daily  . nicotine  21 mg Transdermal Daily   Continuous Infusions: . piperacillin-tazobactam (ZOSYN)  IV Stopped (12/16/16 0438)  . 0.9 % sodium chloride with kcl 75 mL/hr at 12/16/16 0608  .  vancomycin Stopped (12/15/16 2306)     LOS: 1 day    Time spent: >30 MINS    Reyne Dumas  [facility] Pager [phone]. If 7PM-7AM, please contact night-coverage at [email or web address], password [facility] 12/16/2016, 8:38 AM  LOS: 1 day

## 2016-12-16 NOTE — Plan of Care (Signed)
Problem: Safety: Goal: Ability to remain free from injury will improve Outcome: Progressing No falls during this admission. Call bell within reach. Bed in low and locked position. Patient alert and oriented. Clean and clear environment maintained. 3/4 siderails in place. Nonskid footwear being utilized. Patient verbalized understanding of safety instruction.  Problem: Education: Goal: Verbalization of understanding the information provided will improve Outcome: Progressing Patient verbalized understanding of disease processes and necessary treatment and complies.

## 2016-12-16 NOTE — Progress Notes (Signed)
Throughout this shift, pt has removed x 3 tele bow and leads. Central telemetry has called to say leads were off. When entering pts room hef states " what do you want to do with this "?. Leads have been replaced to pt x3 this shift, but pt declines to leave them in place.

## 2016-12-17 ENCOUNTER — Encounter (HOSPITAL_COMMUNITY): Payer: Self-pay | Admitting: General Practice

## 2016-12-17 DIAGNOSIS — F1010 Alcohol abuse, uncomplicated: Secondary | ICD-10-CM

## 2016-12-17 LAB — GLUCOSE, CAPILLARY
GLUCOSE-CAPILLARY: 110 mg/dL — AB (ref 65–99)
GLUCOSE-CAPILLARY: 42 mg/dL — AB (ref 65–99)
GLUCOSE-CAPILLARY: 97 mg/dL (ref 65–99)
Glucose-Capillary: 184 mg/dL — ABNORMAL HIGH (ref 65–99)
Glucose-Capillary: 188 mg/dL — ABNORMAL HIGH (ref 65–99)
Glucose-Capillary: 271 mg/dL — ABNORMAL HIGH (ref 65–99)
Glucose-Capillary: 31 mg/dL — CL (ref 65–99)
Glucose-Capillary: 47 mg/dL — ABNORMAL LOW (ref 65–99)

## 2016-12-17 LAB — BASIC METABOLIC PANEL
Anion gap: 4 — ABNORMAL LOW (ref 5–15)
BUN: 7 mg/dL (ref 6–20)
CALCIUM: 8.1 mg/dL — AB (ref 8.9–10.3)
CO2: 23 mmol/L (ref 22–32)
Chloride: 112 mmol/L — ABNORMAL HIGH (ref 101–111)
Creatinine, Ser: 1.18 mg/dL (ref 0.61–1.24)
GFR calc Af Amer: >60 mL/min (ref >60)
GLUCOSE: 131 mg/dL — AB (ref 65–99)
Potassium: 4.9 mmol/L (ref 3.5–5.1)
Sodium: 139 mmol/L (ref 135–145)

## 2016-12-17 LAB — HIV ANTIBODY (ROUTINE TESTING W REFLEX): HIV SCREEN 4TH GENERATION: NONREACTIVE

## 2016-12-17 MED ORDER — DEXTROSE 10 % IV SOLN
INTRAVENOUS | Status: AC
  Administered 2016-12-17: 15:00:00 via INTRAVENOUS

## 2016-12-17 MED ORDER — OXYCODONE HCL ER 10 MG PO T12A
10.0000 mg | EXTENDED_RELEASE_TABLET | Freq: Every day | ORAL | Status: DC
  Administered 2016-12-17 – 2016-12-18 (×2): 10 mg via ORAL
  Filled 2016-12-17 (×2): qty 1

## 2016-12-17 MED ORDER — INSULIN GLARGINE 100 UNIT/ML ~~LOC~~ SOLN
15.0000 [IU] | Freq: Every day | SUBCUTANEOUS | Status: DC
  Administered 2016-12-17: 15 [IU] via SUBCUTANEOUS
  Filled 2016-12-17: qty 0.15

## 2016-12-17 MED ORDER — ENOXAPARIN SODIUM 40 MG/0.4ML ~~LOC~~ SOLN
40.0000 mg | SUBCUTANEOUS | Status: AC
  Administered 2016-12-17: 40 mg via SUBCUTANEOUS
  Filled 2016-12-17: qty 0.4

## 2016-12-17 NOTE — Progress Notes (Signed)
[facility] PROGRESS NOTE  Raymond ButteryGary Winters ZOX:[id number] DOB: December 09, 1960 DOA: 12/14/2016   PCP: Jaclyn ShaggyAmao, [name], MD     Assessment/Plan: Principal Problem:   Cellulitis of right ankle Active Problems:   Heroin abuse   Alcohol abuse   Tobacco abuse   Type 2 diabetes mellitus with complication, with long-term current use of insulin (HCC)   Lactic acid increased, CHRONIC   Depression   Cellulitis of foot, right   Chronic kidney disease (CKD), stage II (mild)   Ankle cellulitis     56 y.o. male with medical history significant of IVDU,  hyperlipidemia, diabetes mellitus, depression, hearing abuse, tobacco abuse, alcohol abuse, alcoholic liver disease, chronic elevation of lactic acid, CKD-II, who presents with right ankle and foot pain. Patient admitted for cellulitis of the right ankle  Assessment and plan Cellulitis of right ankle and foot:  Without sepsis,  lactic acidosis has resolved. Pt failed outpatient oral antibiotic treatment. Now on vancomycin and Zosyn, day #4, very slow to improve Patient has a history of IV drug use and injecting himself and his foot with IV heroin Blood cultures x 2 , no growth so far MRI of the right ankle showed drainable abscess, Dr. Margarita Ranaimothy Murphy  notified, likely will need incision and drainage in the OR tomorrow 6/29 Uric acid 2.8 Continue current pain regimen npo after midnight   Hx of polysubstance abuse: UDS positive for cocaine and opiates, history of IV drug use including alcohol,  Last drink of alcohol was 4 days prior to admission, no signs of withdrawal. Nicotine patch HIV nonreactive, patient has a history of hepatitis C   Depression Stable, no suicidal or homicidal ideations. -Continue home medications: Mirtazapine  HLD: -Lipitor  DM-II:  hypoglycemic  overnight, after receiving  25 units of Lantus, reduce dose to 15 units of Lantus at bedtime Last A1c 13.2 on 12/08/16, poorly controled and very labile  Continuous  sliding scale insulin  hypokalemia Repleted  Chronic kidney disease (CKD), stage II (mild): stable. Baseline creatinine 1.1-1.3. His creatinine is stable, 1.23 >1.02      DVT prophylaxsis Lovenox  Code Status:  Full code     Family Communication: Discussed in detail with the patient, all imaging results, lab results explained to the patient   Disposition Plan:   OR in am       Consultants:  Orthopedics  Procedures:  None  Antibiotics: Anti-infectives    Start     Dose/Rate Route Frequency Ordered Stop   12/15/16 2000  vancomycin (VANCOCIN) IVPB 750 mg/150 ml premix     750 mg 150 mL/hr over 60 Minutes Intravenous Every 24 hours 12/14/16 1927     12/15/16 0000  piperacillin-tazobactam (ZOSYN) IVPB 3.375 g     3.375 g 12.5 mL/hr over 240 Minutes Intravenous Every 8 hours 12/14/16 2328     12/14/16 2000  piperacillin-tazobactam (ZOSYN) IVPB 3.375 g  Status:  Discontinued     3.375 g 12.5 mL/hr over 240 Minutes Intravenous Every 8 hours 12/14/16 1927 12/14/16 2328   12/14/16 1815  vancomycin (VANCOCIN) IVPB 1000 mg/200 mL premix     1,000 mg 200 mL/hr over 60 Minutes Intravenous  Once 12/14/16 1800 12/14/16 2003         HPI/Subjective: Continues to have pain with weightbearing  Objective: Vitals:   12/15/16 2030 12/16/16 0544 12/16/16 1500 12/16/16 2200  BP: 119/76 121/82 124/80 135/70  Pulse: 79 74 76 81  Resp: 16 18 18  Temp: 98.6 F (37 C) 99 F (37.2 C) 98.7 F (37.1 C) 98.8 F (37.1 C)  TempSrc: Oral Oral Oral Oral  SpO2: 100% 92% 95% 95%  Weight:      Height:        Intake/Output Summary (Last 24 hours) at 12/17/16 0914 Last data filed at 12/17/16 0600  Gross per 24 hour  Intake              720 ml  Output             1200 ml  Net             -480 ml    Exam:  Examination:  General exam: Appears calm and comfortable  Respiratory system: Clear to auscultation. Respiratory effort normal. Cardiovascular system: S1 & S2 heard, RRR. No  JVD, murmurs, rubs, gallops or clicks. No pedal edema. Gastrointestinal system: Abdomen is nondistended, soft and nontender. No organomegaly or masses felt. Normal bowel sounds heard. Central nervous system: Alert and oriented. No focal neurological deficits. Extremities:     SWELLING above medial malleolus Skin: No rashes, lesions or ulcers Psychiatry: Judgement and insight appear normal. Mood & affect appropriate.     Data Reviewed: I have personally reviewed following labs and imaging studies  Micro Results Recent Results (from the past 240 hour(s))  Culture, blood (x 2)     Status: None (Preliminary result)   Collection Time: 12/14/16  7:50 PM  Result Value Ref Range Status   Specimen Description BLOOD RIGHT HAND  Final   Special Requests IN PEDIATRIC BOTTLE Blood Culture adequate volume  Final   Culture NO GROWTH 2 DAYS  Final   Report Status PENDING  Incomplete  Culture, blood (x 2)     Status: None (Preliminary result)   Collection Time: 12/14/16  7:54 PM  Result Value Ref Range Status   Specimen Description BLOOD RIGHT HAND  Final   Special Requests   Final    BOTTLES DRAWN AEROBIC AND ANAEROBIC Blood Culture adequate volume   Culture NO GROWTH 2 DAYS  Final   Report Status PENDING  Incomplete    Radiology Reports Dg Ankle Complete Right  Result Date: 12/14/2016 CLINICAL DATA:  Right ankle pain and swelling with probable infection. EXAM: RIGHT ANKLE - COMPLETE 3+ VIEW COMPARISON:  12/08/2016 FINDINGS: There is no evidence of acute fracture, subluxation or dislocation. No radiographic evidence of osteomyelitis noted. Mild soft tissue swelling noted. IMPRESSION: Mild soft tissue swelling without acute bony abnormality. Electronically Signed   By: Harmon Pier M.D.   On: 12/14/2016 18:37   Dg Ankle Complete Right  Result Date: 12/08/2016 CLINICAL DATA:  Right ankle pain and swelling, initial encounter EXAM: RIGHT ANKLE - COMPLETE 3+ VIEW COMPARISON:  None. FINDINGS: There is  no evidence of fracture, dislocation, or joint effusion. There is no evidence of arthropathy or other focal bone abnormality. Soft tissues are unremarkable. IMPRESSION: No acute abnormality noted Electronically Signed   By: Alcide Clever M.D.   On: 12/08/2016 16:24   Mr Ankle Right Wo Contrast  Result Date: 12/16/2016 CLINICAL DATA:  Right ankle pain and swelling. The patient reports he attempted to inject heroin at the right ankle 10 days ago. EXAM: MRI OF THE RIGHT ANKLE WITHOUT CONTRAST TECHNIQUE: Multiplanar, multisequence MR imaging of the ankle was performed. No intravenous contrast was administered. COMPARISON:  12/14/2016 FINDINGS: Soft tissues: There is a 2 x 2 x 1 cm fluid collection at the medial aspect of the  distal tibia just superficial to the posterior tibialis tendon which could represent abscess or the site of foreign substance injection this is best seen on image 11 of series 6 and image 21 of series 3. There is subcutaneous edema circumferentially in the lower leg extending onto the dorsum of the foot. TENDONS Peroneal: Normal. Posteromedial: Tenosynovitis involving the intact tibialis posterior, flexor hallucis longus and flexor digitorum longus tendons. Given the immediate proximity of the tenosynovitis to the abnormal fluid collection in the soft tissues, this could represent chemical tenosynovitis or infectious tenosynovitis. Anterior: Normal intact tibialis anterior, extensor hallucis longus and extensor digitorum longus tendons. Achilles: Normal. Plantar Fascia: Normal. LIGAMENTS Lateral: Intact. Medial: Intact. CARTILAGE Ankle Joint: No joint effusion or chondral defect. Subtalar Joints/Sinus Tarsi: No joint effusion or chondral defect. Bones: No marrow signal abnormality. No fracture or dislocation. IMPRESSION: 1. Tenosynovitis involving the intact tibialis posterior, flexor hallucis longus and flexor digitorum longus tendons at the posteromedial aspect of the ankle 2. Overlying soft  tissue fluid collection which may represent the site of heroin injection. The findings could represent infection or chemical irritation of the tendons. Electronically Signed   By: Francene Boyers M.D.   On: 12/16/2016 15:23     CBC  Recent Labs Lab 12/14/16 1232 12/15/16 0340 12/16/16 0600  WBC 4.5 4.1 6.0  HGB 12.5* 9.6* 10.8*  HCT 38.2* 29.1* 33.9*  PLT 271 206 218  MCV 76.9* 76.4* 76.9*  MCH 25.2* 24.4* 24.5*  MCHC 32.7 32.0 31.9  RDW 13.9 13.8 13.9  LYMPHSABS 1.8  --   --   MONOABS 0.4  --   --   EOSABS 0.0  --   --   BASOSABS 0.0  --   --     Chemistries   Recent Labs Lab 12/14/16 1232 12/14/16 2159 12/15/16 0340 12/15/16 0900 12/16/16 0600 12/17/16 0542  NA 135 133* 141  --  141 139  K 4.8 4.7 3.1*  --  5.2* 4.9  CL 103 104 116*  --  114* 112*  CO2 21* 23 20*  --  22 23  GLUCOSE 302* 661* 36*  --  78 131*  BUN 12 15 13   --  10 7  CREATININE 1.23 1.30* 1.08  --  1.02 1.18  CALCIUM 8.8* 7.8* 7.8*  --  8.1* 8.1*  MG  --   --   --  1.8  --   --   AST 77*  --   --  50* 51*  --   ALT 30  --   --  21 26  --   ALKPHOS 112  --   --  87 98  --   BILITOT 0.5  --   --  0.5 0.4  --    ------------------------------------------------------------------------------------------------------------------ estimated creatinine clearance is 47.9 mL/min (by C-G formula based on SCr of 1.18 mg/dL). ------------------------------------------------------------------------------------------------------------------ No results for input(s): HGBA1C in the last 72 hours. ------------------------------------------------------------------------------------------------------------------ No results for input(s): CHOL, HDL, LDLCALC, TRIG, CHOLHDL, LDLDIRECT in the last 72 hours. ------------------------------------------------------------------------------------------------------------------ No results for input(s): TSH, T4TOTAL, T3FREE, THYROIDAB in the last 72 hours.  Invalid input(s):  FREET3 ------------------------------------------------------------------------------------------------------------------ No results for input(s): VITAMINB12, FOLATE, FERRITIN, TIBC, IRON, RETICCTPCT in the last 72 hours.  Coagulation profile No results for input(s): INR, PROTIME in the last 168 hours.  No results for input(s): DDIMER in the last 72 hours.  Cardiac Enzymes No results for input(s): CKMB, TROPONINI, MYOGLOBIN in the last 168 hours.  Invalid input(s): CK ------------------------------------------------------------------------------------------------------------------ Invalid input(s):  POCBNP   CBG:  Recent Labs Lab 12/16/16 1905 12/16/16 2042 12/17/16 0019 12/17/16 0655 12/17/16 0759  GLUCAP 62* 199* 271* 31* 110*       Studies: Mr Ankle Right Wo Contrast  Result Date: 12/16/2016 CLINICAL DATA:  Right ankle pain and swelling. The patient reports he attempted to inject heroin at the right ankle 10 days ago. EXAM: MRI OF THE RIGHT ANKLE WITHOUT CONTRAST TECHNIQUE: Multiplanar, multisequence MR imaging of the ankle was performed. No intravenous contrast was administered. COMPARISON:  12/14/2016 FINDINGS: Soft tissues: There is a 2 x 2 x 1 cm fluid collection at the medial aspect of the distal tibia just superficial to the posterior tibialis tendon which could represent abscess or the site of foreign substance injection this is best seen on image 11 of series 6 and image 21 of series 3. There is subcutaneous edema circumferentially in the lower leg extending onto the dorsum of the foot. TENDONS Peroneal: Normal. Posteromedial: Tenosynovitis involving the intact tibialis posterior, flexor hallucis longus and flexor digitorum longus tendons. Given the immediate proximity of the tenosynovitis to the abnormal fluid collection in the soft tissues, this could represent chemical tenosynovitis or infectious tenosynovitis. Anterior: Normal intact tibialis anterior, extensor  hallucis longus and extensor digitorum longus tendons. Achilles: Normal. Plantar Fascia: Normal. LIGAMENTS Lateral: Intact. Medial: Intact. CARTILAGE Ankle Joint: No joint effusion or chondral defect. Subtalar Joints/Sinus Tarsi: No joint effusion or chondral defect. Bones: No marrow signal abnormality. No fracture or dislocation. IMPRESSION: 1. Tenosynovitis involving the intact tibialis posterior, flexor hallucis longus and flexor digitorum longus tendons at the posteromedial aspect of the ankle 2. Overlying soft tissue fluid collection which may represent the site of heroin injection. The findings could represent infection or chemical irritation of the tendons. Electronically Signed   By: Francene Boyers M.D.   On: 12/16/2016 15:23      Lab Results  Component Value Date   HGBA1C 13.2 12/08/2016   HGBA1C 10.9 (H) 08/22/2016   HGBA1C 7.8 (H) 06/23/2016   Lab Results  Component Value Date   MICROALBUR 2.3 06/29/2016   LDLCALC 16 01/03/2014   CREATININE 1.18 12/17/2016       Scheduled Meds: . aspirin EC  81 mg Oral Daily  . atorvastatin  40 mg Oral Daily  . enoxaparin (LOVENOX) injection  40 mg Subcutaneous Q24H  . gabapentin  600 mg Oral QHS  . insulin aspart  0-9 Units Subcutaneous TID WC  . insulin glargine  25 Units Subcutaneous QHS  . lipase/protease/amylase  24,000 Units Oral TID AC  . mirtazapine  30 mg Oral QHS  . multivitamin with minerals  1 tablet Oral Daily  . nicotine  21 mg Transdermal Daily   Continuous Infusions: . piperacillin-tazobactam (ZOSYN)  IV 3.375 g (12/17/16 0225)  . vancomycin Stopped (12/17/16 0211)     LOS: 2 days    Time spent: >30 MINS    Richarda Overlie  [facility] Pager [phone]. If 7PM-7AM, please contact night-coverage at [email or web address], password [facility] 12/17/2016, 9:14 AM  LOS: 2 days

## 2016-12-17 NOTE — Progress Notes (Signed)
Occupational Therapy Evaluation Patient Details Name: Raymond ButteryGary Winters MRN: [id number] DOB: [birth year] Today's Date: 12/17/2016    History of Present Illness Pt is a 56 y/o M with cellulitis of R ankle. PMH of HLD, DM, depression, alcohol abuse, CKD, liver disease, Hep C.    Clinical Impression   PTA, pt lived with brother and was independent with mobility and ADL. Pt overall returning to modified independent level with ADL and mobility for household distances. Discussed recommendation of using 3in1 as showerchair. Pt declined. No further OT needs at this time. If pt has further needs after surgery, please reorder. Thanks.    Follow Up Recommendations  No OT follow up;Supervision - Intermittent    Equipment Recommendations  None recommended by OT    Recommendations for Other Services       Precautions / Restrictions Precautions Precautions: Fall Restrictions Weight Bearing Restrictions: No      Mobility Bed Mobility Overal bed mobility: Modified Independent                Transfers Overall transfer level: Needs assistance Equipment used: None Transfers: Sit to/from Stand Sit to Stand: Modified independent (Device/Increase time)              Balance Overall balance assessment: Needs assistance Sitting-balance support: Feet supported Sitting balance-Leahy Scale: Good    Standing balance support: No upper extremity supported;During functional activity Standing balance-Leahy Scale: Fair                             ADL either performed or assessed with clinical judgement   ADL Overall ADL's : At baseline                                       General ADL Comments: Pt ambulating @ room without AD. Discussed possibiilty of 3in1 to use as shower chair. Pt stated he would not need one.      Vision         Perception     Praxis      Pertinent Vitals/Pain Pain Assessment: 0-10 Pain Score: 5  Pain Location: R ankle  Pain  Descriptors / Indicators: Discomfort Pain Intervention(s): Limited activity within patient's tolerance     Hand Dominance Right   Extremity/Trunk Assessment Upper Extremity Assessment Upper Extremity Assessment: Overall WFL for tasks assessed   Lower Extremity Assessment Lower Extremity Assessment: Defer to PT evaluation   Cervical / Trunk Assessment Cervical / Trunk Assessment: Normal   Communication Communication Communication: No difficulties   Cognition Arousal/Alertness: Awake/alert Behavior During Therapy: Impulsive (most likely baseline) Overall Cognitive Status: Within Functional Limits for tasks assessed                                     General Comments       Exercises     Shoulder Instructions      Home Living Family/patient expects to be discharged to:: Private residence Living Arrangements: Other relatives (Brother) Available Help at Discharge: Family (Brother available 24/7) Type of Home: Apartment Home Access: Stairs to enter Secretary/administratorntrance Stairs-Number of Steps: 1 Entrance Stairs-Rails: None Home Layout: One level     Bathroom Shower/Tub: Chief Strategy OfficerTub/shower unit   Bathroom Toilet: Standard Bathroom Accessibility: Yes How Accessible: Accessible via walker Home Equipment: Grab bars -  tub/shower;Walker - 2 wheels;Cane - single point          Prior Functioning/Environment Level of Independence: Independent        Comments: Pt was independent with ADLs, and iADLs and community ambulator        OT Problem List: Pain      OT Treatment/Interventions:      OT Goals(Current goals can be found in the care plan section) Acute Rehab OT Goals Patient Stated Goal: to go home OT Goal Formulation: All assessment and education complete, DC therapy  OT Frequency:     Barriers to D/C:            Co-evaluation              AM-PAC PT "6 Clicks" Daily Activity     Outcome Measure Help from another person eating meals?: None Help  from another person taking care of personal grooming?: None Help from another person toileting, which includes using toliet, bedpan, or urinal?: None Help from another person bathing (including washing, rinsing, drying)?: None Help from another person to put on and taking off regular upper body clothing?: None Help from another person to put on and taking off regular lower body clothing?: None 6 Click Score: 24   End of Session Nurse Communication: Mobility status  Activity Tolerance: Patient tolerated treatment well Patient left: Other (comment) (in bathroom)  OT Visit Diagnosis: Pain;Unsteadiness on feet (R26.81) Pain - Right/Left: Right Pain - part of body: Ankle and joints of foot                Time: 1610-9604 OT Time Calculation (min): 13 min Charges:  OT General Charges $OT Visit: 1 Procedure OT Evaluation $OT Eval Low Complexity: 1 Procedure G-Codes:     Khyla Mccumbers, OT/L  [phone] 12/17/2016  Aicha Clingenpeel,HILLARY 12/17/2016, 12:30 PM

## 2016-12-17 NOTE — Consult Note (Signed)
Reason for Consult:Right ankle abscess Referring Physician: Kirkland Winters is an 56 y.o. male.  HPI: Raymond Winters was admitted Monday after about a 2 week hx/o right ankle pain that started after he injected heroin in his foot and missed the vein. He developed pain the neck day and was seen by his PCP. He underwent a course of Augmentin that he says did not help at all. After seeing her a second time he was directed to come to the ED where he was admitted. He's had some mild chills and some emesis at home but no subjective fevers or sweats. He has noted no improvement in his symptoms here since admission. A MRI showed a small superficial abscess and orthopedic surgery was consulted.  Past Medical History:  Diagnosis Date  . Alcohol dependence ([place]) 01/05/2016  . Alcoholic liver disease, unspecified ([place]) 01/05/2016  . Arthritis    "lower extremeties, hands, shoulders, back" (08/20/2016)  . Chronic alcoholic pancreatitis ([place]) 01/05/2016  . Depression   . Family history of adverse reaction to anesthesia    pt's sister has hx. of post-op N/V  . Hepatitis C    "never treated" (08/20/2016)  . Heroin abuse   . High cholesterol   . Hypoalbuminemia due to protein-calorie malnutrition ([place]) 01/05/2016  . Insulin dependent diabetes mellitus ([place])   . Lactic acid increased, CHRONIC 01/05/2016   Secondary to Liver Disease, Alcoholism, and Diabetes  . 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 Shoulder wound 01/2016   left    Past Surgical History:  Procedure Laterality Date  . APPLICATION OF WOUND VAC Left 12/24/2015   Procedure: APPLICATION OF WOUND VAC; LEFT SHOULDER;  Surgeon: Leandrew Koyanagi, MD;  Location: [place];  Service: Orthopedics;  Laterality: Left;  . APPLICATION OF WOUND VAC Left 01/29/2016   Procedure: LEFT SHOULDER SPLIT THICKNESS SKIN GRAFT, WOUND VAC;  Surgeon: Leandrew Koyanagi, MD;  Location: [place];  Service: Orthopedics;  Laterality: Left;  . I&D EXTREMITY  Right 03/20/2013   Procedure: IRRIGATION AND DEBRIDEMENT EXTREMITY;  Surgeon: Schuyler Amor, MD;  Location: [place];  Service: Orthopedics;  Laterality: Right;  . I&D EXTREMITY Left 12/24/2015   Procedure:  DEBRIDEMENT OF MUSCLE, SUBCUTANEOUS TISSUE LEFT SHOULDER;  Surgeon: Leandrew Koyanagi, MD;  Location: [place];  Service: Orthopedics;  Laterality: Left;  . INCISION AND DRAINAGE ABSCESS Right 08/19/2016   Procedure: DEBRIDEMENT RIGHT BUTTOCK  ABSCESS;  Surgeon: Donnie Mesa, MD;  Location: [place];  Service: General;  Laterality: Right;  . IRRIGATION AND DEBRIDEMENT ABSCESS Right 08/19/2016   buttocks  . IRRIGATION AND DEBRIDEMENT SHOULDER Left 12/27/2015   Procedure: IRRIGATION AND DEBRIDEMENT LEFT SHOULDER;  Surgeon: Leandrew Koyanagi, MD;  Location: [facility];  Service: Orthopedics;  Laterality: Left;  . IRRIGATION AND DEBRIDEMENT SHOULDER Left 12/30/2015   Procedure: IRRIGATION AND DEBRIDEMENT LEFT SHOULDER;  Surgeon: Leandrew Koyanagi, MD;  Location: [place];  Service: Orthopedics;  Laterality: Left;  . SKIN SPLIT GRAFT Left 01/29/2016   Procedure: SKIN GRAFT SPLIT THICKNESS,;  Surgeon: Leandrew Koyanagi, MD;  Location: [place];  Service: Orthopedics;  Laterality: Left;    Family History  Problem Relation Age of Onset  . Diabetes Mother   . Heart disease Mother   . Hypertension Mother   . Stroke Mother   . Vision loss Mother   . Heart disease Father   . Hypertension Father   . Anesthesia problems Sister  post-op N/V    Social History:  reports that he has been smoking Cigarettes.  He has a 18.50 pack-year smoking history. He has never used smokeless tobacco. He reports that he uses drugs, including IV and Heroin. He reports that he does not drink alcohol.  Allergies:  Allergies  Allergen Reactions  . Ativan [Lorazepam] Other (See Comments)    HALLUCINATIONS    Medications: I have reviewed the patient's current medications.  Results for orders placed or performed during the  hospital encounter of 12/14/16 (from the past 48 hour(s))  Glucose, capillary     Status: Abnormal   Collection Time: 12/15/16 11:37 AM  Result Value Ref Range   Glucose-Capillary 14 (LL) 65 - 99 mg/dL   Comment 1 Notify RN   Glucose, capillary     Status: Abnormal   Collection Time: 12/15/16 12:11 PM  Result Value Ref Range   Glucose-Capillary 34 (LL) 65 - 99 mg/dL   Comment 1 Notify RN   Glucose, capillary     Status: None   Collection Time: 12/15/16 12:40 PM  Result Value Ref Range   Glucose-Capillary 73 65 - 99 mg/dL  Glucose, capillary     Status: Abnormal   Collection Time: 12/15/16  3:08 PM  Result Value Ref Range   Glucose-Capillary 114 (H) 65 - 99 mg/dL  Glucose, capillary     Status: Abnormal   Collection Time: 12/15/16  5:31 PM  Result Value Ref Range   Glucose-Capillary 138 (H) 65 - 99 mg/dL  Glucose, capillary     Status: Abnormal   Collection Time: 12/15/16  8:33 PM  Result Value Ref Range   Glucose-Capillary 193 (H) 65 - 99 mg/dL  Glucose, capillary     Status: Abnormal   Collection Time: 12/16/16 12:32 AM  Result Value Ref Range   Glucose-Capillary 205 (H) 65 - 99 mg/dL  Glucose, capillary     Status: None   Collection Time: 12/16/16  5:39 AM  Result Value Ref Range   Glucose-Capillary 98 65 - 99 mg/dL  Comprehensive metabolic panel     Status: Abnormal   Collection Time: 12/16/16  6:00 AM  Result Value Ref Range   Sodium 141 135 - 145 mmol/L   Potassium 5.2 (H) 3.5 - 5.1 mmol/L    Comment: SLIGHT HEMOLYSIS   Chloride 114 (H) 101 - 111 mmol/L   CO2 22 22 - 32 mmol/L   Glucose, Bld 78 65 - 99 mg/dL   BUN 10 6 - 20 mg/dL   Creatinine, Ser 1.02 0.61 - 1.24 mg/dL   Calcium 8.1 (L) 8.9 - 10.3 mg/dL   Total Protein 5.7 (L) 6.5 - 8.1 g/dL   Albumin 2.2 (L) 3.5 - 5.0 g/dL   AST 51 (H) 15 - 41 U/L   ALT 26 17 - 63 U/L   Alkaline Phosphatase 98 38 - 126 U/L   Total Bilirubin 0.4 0.3 - 1.2 mg/dL   GFR calc non Af Amer >60 >60 mL/min   GFR calc Af Amer >60  >60 mL/min    Comment: (NOTE) The eGFR has been calculated using the CKD EPI equation. This calculation has not been validated in all clinical situations. eGFR's persistently <60 mL/min signify possible Chronic Kidney Disease.    Anion gap 5 5 - 15  CBC     Status: Abnormal   Collection Time: 12/16/16  6:00 AM  Result Value Ref Range   WBC 6.0 4.0 - 10.5 K/uL   RBC 4.41 4.22 -  5.81 MIL/uL   Hemoglobin 10.8 (L) 13.0 - 17.0 g/dL   HCT 33.9 (L) 39.0 - 52.0 %   MCV 76.9 (L) 78.0 - 100.0 fL   MCH 24.5 (L) 26.0 - 34.0 pg   MCHC 31.9 30.0 - 36.0 g/dL   RDW 13.9 11.5 - 15.5 %   Platelets 218 150 - 400 K/uL  Glucose, capillary     Status: Abnormal   Collection Time: 12/16/16  7:54 AM  Result Value Ref Range   Glucose-Capillary 142 (H) 65 - 99 mg/dL  Uric acid     Status: Abnormal   Collection Time: 12/16/16  9:31 AM  Result Value Ref Range   Uric Acid, Serum 2.8 (L) 4.4 - 7.6 mg/dL  HIV antibody     Status: None   Collection Time: 12/16/16  9:31 AM  Result Value Ref Range   HIV Screen 4th Generation wRfx Non Reactive Non Reactive    Comment: (NOTE) Performed At: [facility] [address] [place], [place] [id number] Lindon Romp MD TK:[id number]   Glucose, capillary     Status: Abnormal   Collection Time: 12/16/16 11:38 AM  Result Value Ref Range   Glucose-Capillary 210 (H) 65 - 99 mg/dL  Glucose, capillary     Status: Abnormal   Collection Time: 12/16/16  5:09 PM  Result Value Ref Range   Glucose-Capillary 55 (L) 65 - 99 mg/dL  Glucose, capillary     Status: Abnormal   Collection Time: 12/16/16  6:23 PM  Result Value Ref Range   Glucose-Capillary 50 (L) 65 - 99 mg/dL  Glucose, capillary     Status: Abnormal   Collection Time: 12/16/16  7:05 PM  Result Value Ref Range   Glucose-Capillary 62 (L) 65 - 99 mg/dL  Glucose, capillary     Status: Abnormal   Collection Time: 12/16/16  8:42 PM  Result Value Ref Range   Glucose-Capillary 199 (H) 65 - 99 mg/dL   Glucose, capillary     Status: Abnormal   Collection Time: 12/17/16 12:19 AM  Result Value Ref Range   Glucose-Capillary 271 (H) 65 - 99 mg/dL  Basic metabolic panel     Status: Abnormal   Collection Time: 12/17/16  5:42 AM  Result Value Ref Range   Sodium 139 135 - 145 mmol/L   Potassium 4.9 3.5 - 5.1 mmol/L   Chloride 112 (H) 101 - 111 mmol/L   CO2 23 22 - 32 mmol/L   Glucose, Bld 131 (H) 65 - 99 mg/dL   BUN 7 6 - 20 mg/dL   Creatinine, Ser 1.18 0.61 - 1.24 mg/dL   Calcium 8.1 (L) 8.9 - 10.3 mg/dL   GFR calc non Af Amer >60 >60 mL/min   GFR calc Af Amer >60 >60 mL/min    Comment: (NOTE) The eGFR has been calculated using the CKD EPI equation. This calculation has not been validated in all clinical situations. eGFR's persistently <60 mL/min signify possible Chronic Kidney Disease.    Anion gap 4 (L) 5 - 15  Glucose, capillary     Status: Abnormal   Collection Time: 12/17/16  6:55 AM  Result Value Ref Range   Glucose-Capillary 31 (LL) 65 - 99 mg/dL   Comment 1 Document in Chart   Glucose, capillary     Status: Abnormal   Collection Time: 12/17/16  7:59 AM  Result Value Ref Range   Glucose-Capillary 110 (H) 65 - 99 mg/dL    Mr Ankle Right Wo Contrast  Result Date: 12/16/2016 CLINICAL DATA:  Right ankle pain and swelling. The patient reports he attempted to inject heroin at the right ankle 10 days ago. EXAM: MRI OF THE RIGHT ANKLE WITHOUT CONTRAST TECHNIQUE: Multiplanar, multisequence MR imaging of the ankle was performed. No intravenous contrast was administered. COMPARISON:  12/14/2016 FINDINGS: Soft tissues: There is a 2 x 2 x 1 cm fluid collection at the medial aspect of the distal tibia just superficial to the posterior tibialis tendon which could represent abscess or the site of foreign substance injection this is best seen on image 11 of series 6 and image 21 of series 3. There is subcutaneous edema circumferentially in the lower leg extending onto the dorsum of the foot.  TENDONS Peroneal: Normal. Posteromedial: Tenosynovitis involving the intact tibialis posterior, flexor hallucis longus and flexor digitorum longus tendons. Given the immediate proximity of the tenosynovitis to the abnormal fluid collection in the soft tissues, this could represent chemical tenosynovitis or infectious tenosynovitis. Anterior: Normal intact tibialis anterior, extensor hallucis longus and extensor digitorum longus tendons. Achilles: Normal. Plantar Fascia: Normal. LIGAMENTS Lateral: Intact. Medial: Intact. CARTILAGE Ankle Joint: No joint effusion or chondral defect. Subtalar Joints/Sinus Tarsi: No joint effusion or chondral defect. Bones: No marrow signal abnormality. No fracture or dislocation. IMPRESSION: 1. Tenosynovitis involving the intact tibialis posterior, flexor hallucis longus and flexor digitorum longus tendons at the posteromedial aspect of the ankle 2. Overlying soft tissue fluid collection which may represent the site of heroin injection. The findings could represent infection or chemical irritation of the tendons. Electronically Signed   By: Lorriane Shire M.D.   On: 12/16/2016 15:23    Review of Systems  Constitutional: Negative for weight loss.  HENT: Negative for ear discharge, ear pain, hearing loss and tinnitus.   Eyes: Negative for blurred vision, double vision, photophobia and pain.  Respiratory: Negative for cough, sputum production and shortness of breath.   Cardiovascular: Negative for chest pain.  Gastrointestinal: Negative for abdominal pain, nausea and vomiting.  Genitourinary: Negative for dysuria, flank pain, frequency and urgency.  Musculoskeletal: Positive for joint pain (Right ankle). Negative for back pain, falls, myalgias and neck pain.  Neurological: Negative for dizziness, tingling, sensory change, focal weakness, loss of consciousness and headaches.  Endo/Heme/Allergies: Does not bruise/bleed easily.  Psychiatric/Behavioral: Negative for depression,  memory loss and substance abuse. The patient is not nervous/anxious.    Blood pressure 135/70, pulse 81, temperature 98.8 F (37.1 C), temperature source Oral, resp. rate 18, height '5\' 7"'$  (1.702 m), weight 49 kg (108 lb), SpO2 95 %. Physical Exam  Constitutional: He appears well-developed and well-nourished. No distress.  HENT:  Head: Normocephalic.  Eyes: Conjunctivae are normal. Right eye exhibits no discharge. Left eye exhibits no discharge. No scleral icterus.  Cardiovascular: Normal rate and regular rhythm.   Respiratory: Effort normal. No respiratory distress.  Musculoskeletal:  RLE No traumatic wounds, ecchymosis, or rash  TTP medial right ankle, small fluctuant mass superior to medial malleolus, mild surrounding erythema  No effusions  Knee stable to varus/ valgus and anterior/posterior stress  Sens DPN, SPN, TN intact  Motor EHL, ext, flex, evers 5/5  DP 1+, PT 0, 1+ NP edema   LLE No traumatic wounds, ecchymosis, or rash  Nontender  No effusions  Knee stable to varus/ valgus and anterior/posterior stress  Sens DPN, SPN, TN intact  Motor EHL, ext, flex, evers 5/5  DP 1+, PT 0, No significant edema  Neurological: He is alert.  Skin: Skin is warm and  dry. He is not diaphoretic.  Psychiatric: He has a normal mood and affect. His behavior is normal.    Assessment/Plan: Right ankle abscess -- For I&D in OR tomorrow afternoon by Dr. Percell Miller. NPO after midnight. Continue abx as rx. Ok to [place] on RLE.    Lisette Abu, PA-C Orthopedic Surgery [phone] 12/17/2016, 10:16 AM

## 2016-12-17 NOTE — Progress Notes (Signed)
Pharmacy Antibiotic Note  Raymond Winters is a [age] y.o. male admitted on 12/14/2016 with cellulitis.  Pharmacy has been consulted for vancomycin and zosyn dosing.  Patient remains afebrile and his WBC is WNL.  His renal function is relatively stable.   Plan: - Continue vanc 750mg  IV Q24H - Zosyn 3.375gm IV Q8H, 4 hr infusion - Monitor renal fxn, clinical progress, vanc trough tomorrow if still on therapy - F/U with reducing Lantus further d/t hypoglycemia    Height: 5\' 7"  (170.2 cm) Weight: 108 lb (49 kg) IBW/kg (Calculated) : 66.1  Temp (24hrs), Avg:98.8 F (37.1 C), Min:98.7 F (37.1 C), Max:98.8 F (37.1 C)   Recent Labs Lab 12/14/16 1232 12/14/16 1248 12/14/16 1954 12/14/16 2159 12/15/16 0049 12/15/16 0340 12/16/16 0600 12/17/16 0542  WBC 4.5  --   --   --   --  4.1 6.0  --   CREATININE 1.23  --   --  1.30*  --  1.08 1.02 1.18  LATICACIDVEN  --  2.50* 4.4* 4.6* 3.6* 1.9  --   --     Estimated Creatinine Clearance: 47.9 mL/min (by C-G formula based on SCr of 1.18 mg/dL).    Allergies  Allergen Reactions  . Ativan [Lorazepam] Other (See Comments)    HALLUCINATIONS    6/25 Vanc >>  6/25 Zosyn >>    Raymond Winters, PharmD, BCPS Pager:  [phone] 12/17/2016, 10:23 AM

## 2016-12-18 ENCOUNTER — Encounter (HOSPITAL_COMMUNITY)
Admission: EM | Disposition: A | Discharge status: Home / Self Care | Payer: Self-pay | Source: Home / Self Care | Attending: Internal Medicine

## 2016-12-18 ENCOUNTER — Inpatient Hospital Stay (HOSPITAL_COMMUNITY): Payer: Medicare Other | Admitting: Certified Registered Nurse Anesthetist

## 2016-12-18 ENCOUNTER — Encounter (HOSPITAL_COMMUNITY): Payer: Self-pay | Admitting: Certified Registered Nurse Anesthetist

## 2016-12-18 HISTORY — PX: I&D EXTREMITY: SHX5045

## 2016-12-18 LAB — CBC
HEMATOCRIT: 27.5 % — AB (ref 39.0–52.0)
Hemoglobin: 8.8 g/dL — ABNORMAL LOW (ref 13.0–17.0)
MCH: 24.9 pg — AB (ref 26.0–34.0)
MCHC: 32 g/dL (ref 30.0–36.0)
MCV: 77.9 fL — ABNORMAL LOW (ref 78.0–100.0)
Platelets: 189 10*3/uL (ref 150–400)
RBC: 3.53 MIL/uL — ABNORMAL LOW (ref 4.22–5.81)
RDW: 14.5 % (ref 11.5–15.5)
WBC: 4.5 10*3/uL (ref 4.0–10.5)

## 2016-12-18 LAB — GLUCOSE, CAPILLARY
GLUCOSE-CAPILLARY: 138 mg/dL — AB (ref 65–99)
GLUCOSE-CAPILLARY: 41 mg/dL — AB (ref 65–99)
GLUCOSE-CAPILLARY: 75 mg/dL (ref 65–99)
GLUCOSE-CAPILLARY: 58 mg/dL — AB (ref 65–99)
GLUCOSE-CAPILLARY: 324 mg/dL — AB (ref 65–99)
Glucose-Capillary: 372 mg/dL — ABNORMAL HIGH (ref 65–99)
Glucose-Capillary: 54 mg/dL — ABNORMAL LOW (ref 65–99)
Glucose-Capillary: 60 mg/dL — ABNORMAL LOW (ref 65–99)
Glucose-Capillary: 67 mg/dL (ref 65–99)
Glucose-Capillary: 76 mg/dL (ref 65–99)
Glucose-Capillary: 89 mg/dL (ref 65–99)
Glucose-Capillary: 99 mg/dL (ref 65–99)

## 2016-12-18 LAB — COMPREHENSIVE METABOLIC PANEL
ALT: 22 U/L (ref 17–63)
AST: 47 U/L — ABNORMAL HIGH (ref 15–41)
Albumin: 2 g/dL — ABNORMAL LOW (ref 3.5–5.0)
Alkaline Phosphatase: 79 U/L (ref 38–126)
Anion gap: 3 — ABNORMAL LOW (ref 5–15)
BILIRUBIN TOTAL: 0.3 mg/dL (ref 0.3–1.2)
BUN: 7 mg/dL (ref 6–20)
CALCIUM: 8.3 mg/dL — AB (ref 8.9–10.3)
CO2: 27 mmol/L (ref 22–32)
Chloride: 108 mmol/L (ref 101–111)
Creatinine, Ser: 1.28 mg/dL — ABNORMAL HIGH (ref 0.61–1.24)
Glucose, Bld: 146 mg/dL — ABNORMAL HIGH (ref 65–99)
POTASSIUM: 4.7 mmol/L (ref 3.5–5.1)
Sodium: 138 mmol/L (ref 135–145)
TOTAL PROTEIN: 5.3 g/dL — AB (ref 6.5–8.1)

## 2016-12-18 LAB — MRSA PCR SCREENING: MRSA BY PCR: NEGATIVE

## 2016-12-18 SURGERY — IRRIGATION AND DEBRIDEMENT EXTREMITY
Anesthesia: General | Site: Ankle | Laterality: Right

## 2016-12-18 MED ORDER — ONDANSETRON HCL 4 MG/2ML IJ SOLN
INTRAMUSCULAR | Status: DC | PRN
Start: 2016-12-18 — End: 2016-12-18
  Administered 2016-12-18: 4 mg via INTRAVENOUS

## 2016-12-18 MED ORDER — DEXTROSE 10 % IV SOLN
INTRAVENOUS | Status: AC
  Administered 2016-12-18: 12:00:00 via INTRAVENOUS

## 2016-12-18 MED ORDER — FENTANYL CITRATE (PF) 100 MCG/2ML IJ SOLN
25.0000 ug | INTRAMUSCULAR | Status: DC | PRN
  Administered 2016-12-18: 50 ug via INTRAVENOUS

## 2016-12-18 MED ORDER — FENTANYL CITRATE (PF) 250 MCG/5ML IJ SOLN
INTRAMUSCULAR | Status: AC
  Filled 2016-12-18: qty 5

## 2016-12-18 MED ORDER — MIDAZOLAM HCL 5 MG/5ML IJ SOLN
INTRAMUSCULAR | Status: DC | PRN
  Administered 2016-12-18 (×2): 1 mg via INTRAVENOUS

## 2016-12-18 MED ORDER — PROPOFOL 10 MG/ML IV BOLUS
INTRAVENOUS | Status: DC | PRN
  Administered 2016-12-18: 120 mg via INTRAVENOUS

## 2016-12-18 MED ORDER — PROMETHAZINE HCL 25 MG/ML IJ SOLN: 6.2500 mg | INTRAMUSCULAR | Status: DC | PRN

## 2016-12-18 MED ORDER — SUGAMMADEX SODIUM 200 MG/2ML IV SOLN
INTRAVENOUS | Status: DC | PRN
  Administered 2016-12-18: 200 mg via INTRAVENOUS

## 2016-12-18 MED ORDER — FENTANYL CITRATE (PF) 100 MCG/2ML IJ SOLN
INTRAMUSCULAR | Status: DC | PRN
  Administered 2016-12-18 (×2): 50 ug via INTRAVENOUS

## 2016-12-18 MED ORDER — LIDOCAINE HCL (CARDIAC) 20 MG/ML IV SOLN
INTRAVENOUS | Status: DC | PRN
  Administered 2016-12-18: 100 mg via INTRAVENOUS

## 2016-12-18 MED ORDER — SODIUM CHLORIDE 0.9 % IR SOLN
Status: DC | PRN
  Administered 2016-12-18: 3000 mL

## 2016-12-18 MED ORDER — MIDAZOLAM HCL 2 MG/2ML IJ SOLN
INTRAMUSCULAR | Status: AC
  Filled 2016-12-18: qty 2

## 2016-12-18 MED ORDER — DEXTROSE 50 % IV SOLN
INTRAVENOUS | Status: AC
  Administered 2016-12-18: 50 mL
  Filled 2016-12-18: qty 50

## 2016-12-18 MED ORDER — FENTANYL CITRATE (PF) 100 MCG/2ML IJ SOLN
INTRAMUSCULAR | Status: AC
  Filled 2016-12-18: qty 2

## 2016-12-18 MED ORDER — INSULIN GLARGINE 100 UNIT/ML ~~LOC~~ SOLN
15.0000 [IU] | Freq: Every day | SUBCUTANEOUS | Status: DC
  Administered 2016-12-18: 15 [IU] via SUBCUTANEOUS
  Filled 2016-12-18: qty 0.15

## 2016-12-18 MED ORDER — LACTATED RINGERS IV SOLN
INTRAVENOUS | Status: DC | PRN
  Administered 2016-12-18: 15:00:00 via INTRAVENOUS

## 2016-12-18 MED ORDER — 0.9 % SODIUM CHLORIDE (POUR BTL) OPTIME
TOPICAL | Status: DC | PRN
  Administered 2016-12-18: 1000 mL

## 2016-12-18 MED ORDER — BUPIVACAINE HCL (PF) 0.25 % IJ SOLN
INTRAMUSCULAR | Status: AC
  Filled 2016-12-18: qty 30

## 2016-12-18 MED ORDER — MEPERIDINE HCL 25 MG/ML IJ SOLN: 6.2500 mg | INTRAMUSCULAR | Status: DC | PRN

## 2016-12-18 MED ORDER — INSULIN GLARGINE 100 UNIT/ML ~~LOC~~ SOLN: 18.0000 [IU] | Freq: Every day | SUBCUTANEOUS | Status: DC

## 2016-12-18 MED ORDER — ROCURONIUM BROMIDE 100 MG/10ML IV SOLN
INTRAVENOUS | Status: DC | PRN
  Administered 2016-12-18: 30 mg via INTRAVENOUS

## 2016-12-18 MED ORDER — SUGAMMADEX SODIUM 200 MG/2ML IV SOLN
INTRAVENOUS | Status: AC
  Filled 2016-12-18: qty 2

## 2016-12-18 MED ORDER — ONDANSETRON HCL 4 MG/2ML IJ SOLN
INTRAMUSCULAR | Status: AC
  Filled 2016-12-18: qty 2

## 2016-12-18 SURGICAL SUPPLY — 57 items
BANDAGE ACE 4X5 VEL STRL LF (GAUZE/BANDAGES/DRESSINGS) ×3 IMPLANT
BANDAGE ACE 6X5 VEL STRL LF (GAUZE/BANDAGES/DRESSINGS) ×3 IMPLANT
BANDAGE ELASTIC 4 VELCRO ST LF (GAUZE/BANDAGES/DRESSINGS) ×2 IMPLANT
BANDAGE ESMARK 6X9 LF (GAUZE/BANDAGES/DRESSINGS) IMPLANT
BLADE SURG 10 STRL SS (BLADE) ×3 IMPLANT
BNDG CMPR 9X4 STRL LF SNTH (GAUZE/BANDAGES/DRESSINGS)
BNDG CMPR 9X6 STRL LF SNTH (GAUZE/BANDAGES/DRESSINGS)
BNDG COHESIVE 4X5 TAN STRL (GAUZE/BANDAGES/DRESSINGS) ×3 IMPLANT
BNDG ESMARK 4X9 LF (GAUZE/BANDAGES/DRESSINGS) IMPLANT
BNDG ESMARK 6X9 LF (GAUZE/BANDAGES/DRESSINGS)
BNDG GAUZE ELAST 4 BULKY (GAUZE/BANDAGES/DRESSINGS) ×3 IMPLANT
CONT SPEC 4OZ CLIKSEAL STRL BL (MISCELLANEOUS) IMPLANT
COVER SURGICAL LIGHT HANDLE (MISCELLANEOUS) ×3 IMPLANT
CUFF TOURN SGL LL 12 NO SLV (MISCELLANEOUS) IMPLANT
CUFF TOURNIQUET SINGLE 34IN LL (TOURNIQUET CUFF) IMPLANT
DRAPE SURG 17X23 STRL (DRAPES) IMPLANT
DRAPE U-SHAPE 47X51 STRL (DRAPES) IMPLANT
DRSG ADAPTIC 3X8 NADH LF (GAUZE/BANDAGES/DRESSINGS) ×2 IMPLANT
DRSG PAD ABDOMINAL 8X10 ST (GAUZE/BANDAGES/DRESSINGS) ×3 IMPLANT
DURAPREP 26ML APPLICATOR (WOUND CARE) ×3 IMPLANT
ELECT REM PT RETURN 9FT ADLT (ELECTROSURGICAL)
ELECTRODE REM PT RTRN 9FT ADLT (ELECTROSURGICAL) IMPLANT
EVACUATOR 1/8 PVC DRAIN (DRAIN) IMPLANT
FACESHIELD WRAPAROUND (MASK) IMPLANT
FACESHIELD WRAPAROUND OR TEAM (MASK) IMPLANT
GAUZE PACKING IODOFORM 1/4X15 (GAUZE/BANDAGES/DRESSINGS) ×2 IMPLANT
GAUZE SPONGE 4X4 12PLY STRL (GAUZE/BANDAGES/DRESSINGS) ×3 IMPLANT
GAUZE SPONGE 4X4 12PLY STRL LF (GAUZE/BANDAGES/DRESSINGS) ×2 IMPLANT
GAUZE XEROFORM 1X8 LF (GAUZE/BANDAGES/DRESSINGS) ×3 IMPLANT
GLOVE BIO SURGEON STRL SZ7.5 (GLOVE) ×6 IMPLANT
GLOVE BIOGEL PI IND STRL 8 (GLOVE) ×2 IMPLANT
GLOVE BIOGEL PI INDICATOR 8 (GLOVE) ×4
GOWN STRL REUS W/ TWL LRG LVL3 (GOWN DISPOSABLE) ×3 IMPLANT
GOWN STRL REUS W/TWL LRG LVL3 (GOWN DISPOSABLE) ×9
HANDPIECE INTERPULSE COAX TIP (DISPOSABLE)
KIT BASIN OR (CUSTOM PROCEDURE TRAY) ×3 IMPLANT
KIT ROOM TURNOVER OR (KITS) ×3 IMPLANT
MANIFOLD NEPTUNE II (INSTRUMENTS) ×3 IMPLANT
NEEDLE 25GAX1.5 (MISCELLANEOUS) IMPLANT
NS IRRIG 1000ML POUR BTL (IV SOLUTION) ×3 IMPLANT
PACK ORTHO EXTREMITY (CUSTOM PROCEDURE TRAY) ×3 IMPLANT
PAD ARMBOARD 7.5X6 YLW CONV (MISCELLANEOUS) ×6 IMPLANT
SET HNDPC FAN SPRY TIP SCT (DISPOSABLE) IMPLANT
SET IRRIG Y TYPE TUR BLADDER L (SET/KITS/TRAYS/PACK) ×2 IMPLANT
SPONGE LAP 18X18 X RAY DECT (DISPOSABLE) IMPLANT
STOCKINETTE IMPERVIOUS 9X36 MD (GAUZE/BANDAGES/DRESSINGS) ×3 IMPLANT
SUT ETHILON 3 0 PS 1 (SUTURE) ×2 IMPLANT
SUT PDS AB 2-0 CT1 27 (SUTURE) IMPLANT
SWAB CULTURE ESWAB REG 1ML (MISCELLANEOUS) IMPLANT
SYR CONTROL 10ML LL (SYRINGE) IMPLANT
TOWEL OR 17X24 6PK STRL BLUE (TOWEL DISPOSABLE) ×3 IMPLANT
TOWEL OR 17X26 10 PK STRL BLUE (TOWEL DISPOSABLE) ×3 IMPLANT
TUBE CONNECTING 12'X1/4 (SUCTIONS) ×1
TUBE CONNECTING 12X1/4 (SUCTIONS) ×2 IMPLANT
TUBING CYSTO DISP (UROLOGICAL SUPPLIES) IMPLANT
UNDERPAD 30X30 (UNDERPADS AND DIAPERS) ×3 IMPLANT
YANKAUER SUCT BULB TIP NO VENT (SUCTIONS) ×3 IMPLANT

## 2016-12-18 NOTE — Progress Notes (Signed)
Hypoglycemic Event  CBG: 41  Treatment: Glucagon IM 25 mg  Symptoms: Shaky and Hungry  Follow-up CBG: Time:*1240 CBG Result:75  Possible Reasons for Event: Inadequate meal intake (Pt NPO for surgery)  Comments/MD notified. Notified Dr. Luane SchoolAbrol    Shah Insley R Keyonia Gluth

## 2016-12-18 NOTE — Transfer of Care (Signed)
Immediate Anesthesia Transfer of Care Note  Patient: Raymond Winters  Procedure(s) Performed: Procedure(s): INCISION AND DRAINAGE ANKLE ABSCESS (Right)  Patient Location: PACU  Anesthesia Type:General  Level of Consciousness: awake, alert  and oriented  Airway & Oxygen Therapy: Patient Spontanous Breathing and Patient connected to face mask oxygen  Post-op Assessment: Report given to RN, Post -op Vital signs reviewed and stable and Patient moving all extremities X 4  Post vital signs: Reviewed and stable  Last Vitals:  Vitals:   12/18/16 0534 12/18/16 1539  BP: 109/68 (!) 142/89  Pulse: 88 (!) 105  Resp:  19  Temp: 36.8 C 36.3 C    Last Pain:  Vitals:   12/18/16 1539  TempSrc:   PainSc: 0-No pain      Patients Stated Pain Goal: 2 (12/17/16 2127)  Complications: No apparent anesthesia complications

## 2016-12-18 NOTE — Progress Notes (Signed)
Hypoglycemic Event  CBG: 58  Treatment: increased 10% Dextrose fluids to 80  Symptoms: headache  Follow-up CBG: Time: 1400 CBG Result:54  Possible Reasons for Event: Inadequate meal intake  Comments/MD notified:Dr. Germeroth  Notified Dr. Renold DonGermeroth about follow-up blood sugar and Dr. Renold DonGermeroth came to see the patient.     Raymond Winters R

## 2016-12-18 NOTE — Progress Notes (Signed)
[facility] PROGRESS NOTE  Raymond Winters ZOX:[id number] DOB: 1960/12/09 DOA: 12/14/2016   PCP: Jaclyn Shaggy, MD     Assessment/Plan: Principal Problem:   Cellulitis of right ankle Active Problems:   Heroin abuse   Alcohol abuse   Tobacco abuse   Type 2 diabetes mellitus with complication, with long-term current use of insulin (HCC)   Lactic acid increased, CHRONIC   Depression   Cellulitis of foot, right   Chronic kidney disease (CKD), stage II (mild)   Ankle cellulitis     56 y.o. male with medical history significant of IVDU,  hyperlipidemia, diabetes mellitus, depression, hearing abuse, tobacco abuse, alcohol abuse, alcoholic liver disease, chronic elevation of lactic acid, CKD-II, who presents with right ankle and foot pain. Patient admitted for cellulitis of the right ankle, found to have an abscess. Incision and drainage scheduled for 6/29 by Dr. Eulah Pont  Assessment and plan Cellulitis/abscess of right ankle and foot:  Without sepsis,  lactic acidosis has resolved. Pt failed outpatient oral antibiotic treatment. Now on vancomycin and Zosyn, day #5, very slow to improve Patient has a history of IV drug use and injecting himself and his foot with IV heroin Blood culture from 6/25 no growth so far MRI of the right ankle showed drainable abscess, Dr. Margarita Rana  notified, likely will need incision and drainage in the OR 6/29 Uric acid 2.8 Continue current pain regimen Follow wound culture results  Hx of polysubstance abuse: UDS positive for cocaine and opiates, history of IV drug use including alcohol,  Last drink of alcohol was 4 days prior to admission, no signs of withdrawal. Nicotine patch HIV nonreactive, patient has a history of hepatitis C  Anemia of chronic illness-hemoglobin 8.8 Baseline hemoglobin between 9 and 13 Hemoglobin drifting down without any overt bleeding Check an anemia panel   Depression Stable, no suicidal or homicidal  ideations. -Continue home medications: Mirtazapine  HLD: -Lipitor  DM-II:  extremely labile, recurrent   Hypoglycemic episodes requiring D10  Patient needs frequent adjustment of insulin/Lantus regimen. 15 units  Of Lantus will be administered tonight last A1c 13.2 on 12/08/16, poorly controled and very labile  Continue sliding scale insulin  hypokalemia Repleted  Chronic kidney disease (CKD), stage II (mild): stable. Baseline creatinine 1.1-1.3. His creatinine is stable, 1.23 >1.02      DVT prophylaxsis Lovenox  Code Status:  Full code     Family Communication: Discussed in detail with the patient  , all imaging results, lab results explained to the patient   Disposition Plan:   OR 6/29, DC probably 2-3 days      Consultants:  Orthopedics  Procedures:  None  Antibiotics: Anti-infectives    Start     Dose/Rate Route Frequency Ordered Stop   12/15/16 2000  vancomycin (VANCOCIN) IVPB 750 mg/150 ml premix     750 mg 150 mL/hr over 60 Minutes Intravenous Every 24 hours 12/14/16 1927     12/15/16 0000  piperacillin-tazobactam (ZOSYN) IVPB 3.375 g     3.375 g 12.5 mL/hr over 240 Minutes Intravenous Every 8 hours 12/14/16 2328     12/14/16 2000  piperacillin-tazobactam (ZOSYN) IVPB 3.375 g  Status:  Discontinued     3.375 g 12.5 mL/hr over 240 Minutes Intravenous Every 8 hours 12/14/16 1927 12/14/16 2328   12/14/16 1815  vancomycin (VANCOCIN) IVPB 1000 mg/200 mL premix     1,000 mg 200 mL/hr over 60 Minutes Intravenous  Once 12/14/16 1800 12/14/16 2003  HPI/Subjective: Pain is manageable , slept well last night , npo for surgery  Objective: Vitals:   12/16/16 2200 12/17/16 1500 12/17/16 2300 12/18/16 0534  BP: 135/70 112/77 120/74 109/68  Pulse: 81 93 100 88  Resp:  18    Temp: 98.8 F (37.1 C) 98.5 F (36.9 C) 98.3 F (36.8 C) 98.3 F (36.8 C)  TempSrc: Oral Oral Oral Axillary  SpO2: 95% 100% 100% 98%  Weight:      Height:         Intake/Output Summary (Last 24 hours) at 12/18/16 0825 Last data filed at 12/18/16 0700  Gross per 24 hour  Intake              840 ml  Output              950 ml  Net             -110 ml    Exam:  Examination:  General exam: Appears calm and comfortable  Respiratory system: Clear to auscultation. Respiratory effort normal. Cardiovascular system: S1 & S2 heard, RRR. No JVD, murmurs, rubs, gallops or clicks. No pedal edema. Gastrointestinal system: Abdomen is nondistended, soft and nontender. No organomegaly or masses felt. Normal bowel sounds heard. Central nervous system: Alert and oriented. No focal neurological deficits. Extremities:  Continues to have swelling about the medial malleolus skin: No rashes, lesions or ulcers Psychiatry: Judgement and insight appear normal. Mood & affect appropriate.     Data Reviewed: I have personally reviewed following labs and imaging studies  Micro Results Recent Results (from the past 240 hour(s))  Culture, blood (x 2)     Status: None (Preliminary result)   Collection Time: 12/14/16  7:50 PM  Result Value Ref Range Status   Specimen Description BLOOD RIGHT HAND  Final   Special Requests IN PEDIATRIC BOTTLE Blood Culture adequate volume  Final   Culture NO GROWTH 3 DAYS  Final   Report Status PENDING  Incomplete  Culture, blood (x 2)     Status: None (Preliminary result)   Collection Time: 12/14/16  7:54 PM  Result Value Ref Range Status   Specimen Description BLOOD RIGHT HAND  Final   Special Requests   Final    BOTTLES DRAWN AEROBIC AND ANAEROBIC Blood Culture adequate volume   Culture NO GROWTH 3 DAYS  Final   Report Status PENDING  Incomplete  MRSA PCR Screening     Status: None   Collection Time: 12/17/16  6:41 PM  Result Value Ref Range Status   MRSA by PCR NEGATIVE NEGATIVE Final    Comment:        The GeneXpert MRSA Assay (FDA approved for NASAL specimens only), is one component of a comprehensive MRSA  colonization surveillance program. It is not intended to diagnose MRSA infection nor to guide or monitor treatment for MRSA infections.     Radiology Reports Dg Ankle Complete Right  Result Date: 12/14/2016 CLINICAL DATA:  Right ankle pain and swelling with probable infection. EXAM: RIGHT ANKLE - COMPLETE 3+ VIEW COMPARISON:  12/08/2016 FINDINGS: There is no evidence of acute fracture, subluxation or dislocation. No radiographic evidence of osteomyelitis noted. Mild soft tissue swelling noted. IMPRESSION: Mild soft tissue swelling without acute bony abnormality. Electronically Signed   By: Harmon PierJeffrey  Hu M.D.   On: 12/14/2016 18:37   Dg Ankle Complete Right  Result Date: 12/08/2016 CLINICAL DATA:  Right ankle pain and swelling, initial encounter EXAM: RIGHT ANKLE - COMPLETE 3+ VIEW  COMPARISON:  None. FINDINGS: There is no evidence of fracture, dislocation, or joint effusion. There is no evidence of arthropathy or other focal bone abnormality. Soft tissues are unremarkable. IMPRESSION: No acute abnormality noted Electronically Signed   By: Alcide Clever M.D.   On: 12/08/2016 16:24   Mr Ankle Right Wo Contrast  Result Date: 12/16/2016 CLINICAL DATA:  Right ankle pain and swelling. The patient reports he attempted to inject heroin at the right ankle 10 days ago. EXAM: MRI OF THE RIGHT ANKLE WITHOUT CONTRAST TECHNIQUE: Multiplanar, multisequence MR imaging of the ankle was performed. No intravenous contrast was administered. COMPARISON:  12/14/2016 FINDINGS: Soft tissues: There is a 2 x 2 x 1 cm fluid collection at the medial aspect of the distal tibia just superficial to the posterior tibialis tendon which could represent abscess or the site of foreign substance injection this is best seen on image 11 of series 6 and image 21 of series 3. There is subcutaneous edema circumferentially in the lower leg extending onto the dorsum of the foot. TENDONS Peroneal: Normal. Posteromedial: Tenosynovitis involving  the intact tibialis posterior, flexor hallucis longus and flexor digitorum longus tendons. Given the immediate proximity of the tenosynovitis to the abnormal fluid collection in the soft tissues, this could represent chemical tenosynovitis or infectious tenosynovitis. Anterior: Normal intact tibialis anterior, extensor hallucis longus and extensor digitorum longus tendons. Achilles: Normal. Plantar Fascia: Normal. LIGAMENTS Lateral: Intact. Medial: Intact. CARTILAGE Ankle Joint: No joint effusion or chondral defect. Subtalar Joints/Sinus Tarsi: No joint effusion or chondral defect. Bones: No marrow signal abnormality. No fracture or dislocation. IMPRESSION: 1. Tenosynovitis involving the intact tibialis posterior, flexor hallucis longus and flexor digitorum longus tendons at the posteromedial aspect of the ankle 2. Overlying soft tissue fluid collection which may represent the site of heroin injection. The findings could represent infection or chemical irritation of the tendons. Electronically Signed   By: Francene Boyers M.D.   On: 12/16/2016 15:23     CBC  Recent Labs Lab 12/14/16 1232 12/15/16 0340 12/16/16 0600 12/18/16 0804  WBC 4.5 4.1 6.0 4.5  HGB 12.5* 9.6* 10.8* 8.8*  HCT 38.2* 29.1* 33.9* 27.5*  PLT 271 206 218 189  MCV 76.9* 76.4* 76.9* 77.9*  MCH 25.2* 24.4* 24.5* 24.9*  MCHC 32.7 32.0 31.9 32.0  RDW 13.9 13.8 13.9 14.5  LYMPHSABS 1.8  --   --   --   MONOABS 0.4  --   --   --   EOSABS 0.0  --   --   --   BASOSABS 0.0  --   --   --     Chemistries   Recent Labs Lab 12/14/16 1232 12/14/16 2159 12/15/16 0340 12/15/16 0900 12/16/16 0600 12/17/16 0542  NA 135 133* 141  --  141 139  K 4.8 4.7 3.1*  --  5.2* 4.9  CL 103 104 116*  --  114* 112*  CO2 21* 23 20*  --  22 23  GLUCOSE 302* 661* 36*  --  78 131*  BUN 12 15 13   --  10 7  CREATININE 1.23 1.30* 1.08  --  1.02 1.18  CALCIUM 8.8* 7.8* 7.8*  --  8.1* 8.1*  MG  --   --   --  1.8  --   --   AST 77*  --   --  50* 51*   --   ALT 30  --   --  21 26  --   ALKPHOS 112  --   --  87 98  --   BILITOT 0.5  --   --  0.5 0.4  --    ------------------------------------------------------------------------------------------------------------------ estimated creatinine clearance is 47.9 mL/min (by C-G formula based on SCr of 1.18 mg/dL). ------------------------------------------------------------------------------------------------------------------ No results for input(s): HGBA1C in the last 72 hours. ------------------------------------------------------------------------------------------------------------------ No results for input(s): CHOL, HDL, LDLCALC, TRIG, CHOLHDL, LDLDIRECT in the last 72 hours. ------------------------------------------------------------------------------------------------------------------ No results for input(s): TSH, T4TOTAL, T3FREE, THYROIDAB in the last 72 hours.  Invalid input(s): FREET3 ------------------------------------------------------------------------------------------------------------------ No results for input(s): VITAMINB12, FOLATE, FERRITIN, TIBC, IRON, RETICCTPCT in the last 72 hours.  Coagulation profile No results for input(s): INR, PROTIME in the last 168 hours.  No results for input(s): DDIMER in the last 72 hours.  Cardiac Enzymes No results for input(s): CKMB, TROPONINI, MYOGLOBIN in the last 168 hours.  Invalid input(s): CK ------------------------------------------------------------------------------------------------------------------ Invalid input(s): POCBNP   CBG:  Recent Labs Lab 12/17/16 1451 12/17/16 1801 12/17/16 2122 12/18/16 0647 12/18/16 0744  GLUCAP 97 184* 188* 60* 138*       Studies: Mr Ankle Right Wo Contrast  Result Date: 12/16/2016 CLINICAL DATA:  Right ankle pain and swelling. The patient reports he attempted to inject heroin at the right ankle 10 days ago. EXAM: MRI OF THE RIGHT ANKLE WITHOUT CONTRAST TECHNIQUE:  Multiplanar, multisequence MR imaging of the ankle was performed. No intravenous contrast was administered. COMPARISON:  12/14/2016 FINDINGS: Soft tissues: There is a 2 x 2 x 1 cm fluid collection at the medial aspect of the distal tibia just superficial to the posterior tibialis tendon which could represent abscess or the site of foreign substance injection this is best seen on image 11 of series 6 and image 21 of series 3. There is subcutaneous edema circumferentially in the lower leg extending onto the dorsum of the foot. TENDONS Peroneal: Normal. Posteromedial: Tenosynovitis involving the intact tibialis posterior, flexor hallucis longus and flexor digitorum longus tendons. Given the immediate proximity of the tenosynovitis to the abnormal fluid collection in the soft tissues, this could represent chemical tenosynovitis or infectious tenosynovitis. Anterior: Normal intact tibialis anterior, extensor hallucis longus and extensor digitorum longus tendons. Achilles: Normal. Plantar Fascia: Normal. LIGAMENTS Lateral: Intact. Medial: Intact. CARTILAGE Ankle Joint: No joint effusion or chondral defect. Subtalar Joints/Sinus Tarsi: No joint effusion or chondral defect. Bones: No marrow signal abnormality. No fracture or dislocation. IMPRESSION: 1. Tenosynovitis involving the intact tibialis posterior, flexor hallucis longus and flexor digitorum longus tendons at the posteromedial aspect of the ankle 2. Overlying soft tissue fluid collection which may represent the site of heroin injection. The findings could represent infection or chemical irritation of the tendons. Electronically Signed   By: Francene Boyers M.D.   On: 12/16/2016 15:23      Lab Results  Component Value Date   HGBA1C 13.2 12/08/2016   HGBA1C 10.9 (H) 08/22/2016   HGBA1C 7.8 (H) 06/23/2016   Lab Results  Component Value Date   MICROALBUR 2.3 06/29/2016   LDLCALC 16 01/03/2014   CREATININE 1.18 12/17/2016       Scheduled Meds: .  aspirin EC  81 mg Oral Daily  . atorvastatin  40 mg Oral Daily  . gabapentin  600 mg Oral QHS  . insulin aspart  0-9 Units Subcutaneous TID WC  . insulin glargine  15 Units Subcutaneous QHS  . lipase/protease/amylase  24,000 Units Oral TID AC  . mirtazapine  30 mg Oral QHS  . multivitamin with minerals  1 tablet Oral Daily  . nicotine  21 mg Transdermal Daily  .  oxyCODONE  10 mg Oral QHS   Continuous Infusions: . piperacillin-tazobactam (ZOSYN)  IV Stopped (12/18/16 0432)  . vancomycin Stopped (12/17/16 2229)     LOS: 3 days    Time spent: >30 MINS    Richarda Overlie  [facility] Pager [phone]. If 7PM-7AM, please contact night-coverage at [email or web address], password [facility] 12/18/2016, 8:25 AM  LOS: 3 days

## 2016-12-18 NOTE — Op Note (Signed)
12/14/2016 - 12/18/2016  3:10 PM  PATIENT:  Raymond Winters    PRE-OPERATIVE DIAGNOSIS:  cellulitis of right ankle  POST-OPERATIVE DIAGNOSIS:  Same  PROCEDURE:  IRRIGATION AND DEBRIDEMENT ANKLE  SURGEON:  Berklee Battey, Jewel BaizeIMOTHY D, MD  ASSISTANT: Aquilla HackerHenry Martensen, PA-C, he was present and scrubbed throughout the case, critical for completion in a timely fashion, and for retraction, instrumentation, and closure.   ANESTHESIA:   gen  PREOPERATIVE INDICATIONS:  Larinda ButteryGary Belay is a  56 y.o. male with a diagnosis of cellulitis of right ankle who failed conservative measures and elected for surgical management.    The risks benefits and alternatives were discussed with the patient preoperatively including but not limited to the risks of infection, bleeding, nerve injury, cardiopulmonary complications, the need for revision surgery, among others, and the patient was willing to proceed.  OPERATIVE IMPLANTS: none  OPERATIVE FINDINGS: purulent fluid  BLOOD LOSS: min  COMPLICATIONS: none  TOURNIQUET TIME: none  OPERATIVE PROCEDURE:  Patient was identified in the preoperative holding area and site was marked by me He was transported to the operating theater and placed on the table in supine position taking care to pad all bony prominences. After a preincinduction time out anesthesia was induced. The right lower extremity was prepped and draped in normal sterile fashion and a pre-incision timeout was performed. He received scheduled abx for preoperative antibiotics.   I made a longitudinal incision over his abscess. Medially purulent fluid was expressed and I collected this and sent for culture.  I then debrided necrotic tissue with a Ronjair and scissors. This did go down to bone I scraped the bone with a curet as well.  I performed tenolyses of his posterior tibial tendon and FHL. I did release his compartment posteriorly to confirm no purulent collection back here.  I then performed a thorough  irrigation.  I then placed packing and closed the skin with a nylon stitch sterile dressing was applied he was awoken and taken the PACU in stable condition  POST OPERATIVE PLAN: continue abx, packing out sat/sun. WBAT, mobilize for dvt px

## 2016-12-18 NOTE — Anesthesia Procedure Notes (Addendum)
Procedure Name: Intubation Date/Time: 12/18/2016 2:52 PM Performed by: Isidore MoosPAXTON, Samule Life A Patient Re-evaluated:Patient Re-evaluated prior to inductionOxygen Delivery Method: Circle system utilized Preoxygenation: Pre-oxygenation with 100% oxygen Intubation Type: IV induction Ventilation: Mask ventilation without difficulty Laryngoscope Size: Glidescope Grade View: Grade II Tube type: Oral Tube size: 7.5 mm Number of attempts: 2 (DL x1; Glidescope x1) Airway Equipment and Method: Rigid stylet Placement Confirmation: ETT inserted through vocal cords under direct vision,  positive ETCO2 and breath sounds checked- equal and bilateral Secured at: 24 cm Tube secured with: Tape Dental Injury: Teeth and Oropharynx as per pre-operative assessment  Difficulty Due To: Difficulty was anticipated, Difficult Airway- due to reduced neck mobility and Difficult Airway- due to anterior larynx Comments: DL X1 w/ Hyacinth MeekerMiller 2 - Kristell Wooding CRNA -> esophageal intubation; recognized immediately. DL X1- w/ glidescope -> intubation. +ETCO2; + BS

## 2016-12-18 NOTE — Anesthesia Preprocedure Evaluation (Signed)
Anesthesia Evaluation  Patient identified by MRN, date of birth, ID band Patient awake    Reviewed: Allergy & Precautions, H&P , NPO status , Patient's Chart, lab work & pertinent test results  Airway Mallampati: II  TM Distance: >3 FB Neck ROM: Full    Dental no notable dental hx. (+) Teeth Intact, Dental Advisory Given   Pulmonary Current Smoker,    Pulmonary exam normal breath sounds clear to auscultation       Cardiovascular negative cardio ROS   Rhythm:Regular Rate:Normal     Neuro/Psych PSYCHIATRIC DISORDERS Depression negative neurological ROS     GI/Hepatic negative GI ROS, (+)     substance abuse  alcohol use and IV drug use, Hepatitis -, C  Endo/Other  diabetes, Insulin Dependent  Renal/GU Renal diseasenegative Renal ROS     Musculoskeletal  (+) Arthritis , Osteoarthritis,    Abdominal   Peds  Hematology negative hematology ROS (+)   Anesthesia Other Findings   Reproductive/Obstetrics negative OB ROS                             Anesthesia Physical  Anesthesia Plan  ASA: III  Anesthesia Plan: General   Post-op Pain Management:    Induction: Intravenous  PONV Risk Score and Plan: 1 and Ondansetron, Propofol and Treatment may vary due to age or medical condition  Airway Management Planned: Oral ETT  Additional Equipment:   Intra-op Plan:   Post-operative Plan: Extubation in OR  Informed Consent: I have reviewed the patients History and Physical, chart, labs and discussed the procedure including the risks, benefits and alternatives for the proposed anesthesia with the patient or authorized representative who has indicated his/her understanding and acceptance.   Dental advisory given  Plan Discussed with: CRNA  Anesthesia Plan Comments:         Anesthesia Quick Evaluation

## 2016-12-18 NOTE — Care Management Important Message (Signed)
Important Message  Patient Details  Name: Raymond ButteryGary Winters MRN: [id number] Date of Birth: 02-Dec-1960   Medicare Important Message Given:  Yes    Demar Shad 12/18/2016, 12:22 PM

## 2016-12-19 LAB — FERRITIN: FERRITIN: 69 ng/mL (ref 24–336)

## 2016-12-19 LAB — IRON AND TIBC
IRON: 23 ug/dL — AB (ref 45–182)
Saturation Ratios: 9 % — ABNORMAL LOW (ref 17.9–39.5)
TIBC: 249 ug/dL — ABNORMAL LOW (ref 250–450)
UIBC: 226 ug/dL

## 2016-12-19 LAB — CBC
HEMATOCRIT: 28.7 % — AB (ref 39.0–52.0)
Hemoglobin: 9.4 g/dL — ABNORMAL LOW (ref 13.0–17.0)
MCH: 25.1 pg — AB (ref 26.0–34.0)
MCHC: 32.8 g/dL (ref 30.0–36.0)
MCV: 76.5 fL — ABNORMAL LOW (ref 78.0–100.0)
Platelets: 222 10*3/uL (ref 150–400)
RBC: 3.75 MIL/uL — ABNORMAL LOW (ref 4.22–5.81)
RDW: 14.1 % (ref 11.5–15.5)
WBC: 6.9 10*3/uL (ref 4.0–10.5)

## 2016-12-19 LAB — CULTURE, BLOOD (ROUTINE X 2)
CULTURE: NO GROWTH
Culture: NO GROWTH
SPECIAL REQUESTS: ADEQUATE
Special Requests: ADEQUATE

## 2016-12-19 LAB — GLUCOSE, CAPILLARY
GLUCOSE-CAPILLARY: 275 mg/dL — AB (ref 65–99)
GLUCOSE-CAPILLARY: 105 mg/dL — AB (ref 65–99)
GLUCOSE-CAPILLARY: 399 mg/dL — AB (ref 65–99)
GLUCOSE-CAPILLARY: 79 mg/dL (ref 65–99)
GLUCOSE-CAPILLARY: 177 mg/dL — AB (ref 65–99)
Glucose-Capillary: 53 mg/dL — ABNORMAL LOW (ref 65–99)

## 2016-12-19 LAB — VITAMIN B12: Vitamin B-12: 473 pg/mL (ref 180–914)

## 2016-12-19 LAB — FOLATE: Folate: 20.5 ng/mL (ref >5.9)

## 2016-12-19 MED ORDER — INSULIN ASPART 100 UNIT/ML ~~LOC~~ SOLN
0.0000 [IU] | Freq: Three times a day (TID) | SUBCUTANEOUS | Status: DC
  Administered 2016-12-19 – 2016-12-20 (×2): 15 [IU] via SUBCUTANEOUS

## 2016-12-19 MED ORDER — GABAPENTIN 300 MG PO CAPS
300.0000 mg | ORAL_CAPSULE | Freq: Two times a day (BID) | ORAL | Status: DC
  Administered 2016-12-19 – 2016-12-23 (×9): 300 mg via ORAL
  Filled 2016-12-19 (×9): qty 1

## 2016-12-19 MED ORDER — OXYCODONE-ACETAMINOPHEN 5-325 MG PO TABS
1.0000 | ORAL_TABLET | ORAL | Status: DC | PRN
  Administered 2016-12-19 – 2016-12-23 (×24): 1 via ORAL
  Filled 2016-12-19 (×24): qty 1

## 2016-12-19 MED ORDER — OXYCODONE HCL 5 MG PO TABS
5.0000 mg | ORAL_TABLET | ORAL | Status: DC | PRN
  Administered 2016-12-19 – 2016-12-23 (×24): 5 mg via ORAL
  Filled 2016-12-19 (×24): qty 1

## 2016-12-19 MED ORDER — SODIUM CHLORIDE 0.9 % IV SOLN
3.0000 g | Freq: Three times a day (TID) | INTRAVENOUS | Status: DC
  Administered 2016-12-19 – 2016-12-22 (×10): 3 g via INTRAVENOUS
  Filled 2016-12-19 (×11): qty 3

## 2016-12-19 MED ORDER — KETOROLAC TROMETHAMINE 15 MG/ML IJ SOLN
15.0000 mg | Freq: Four times a day (QID) | INTRAMUSCULAR | Status: DC | PRN
  Administered 2016-12-19 – 2016-12-20 (×3): 15 mg via INTRAVENOUS
  Filled 2016-12-19 (×3): qty 1

## 2016-12-19 MED ORDER — INSULIN ASPART 100 UNIT/ML ~~LOC~~ SOLN: 0.0000 [IU] | Freq: Every day | SUBCUTANEOUS | Status: DC

## 2016-12-19 MED ORDER — TRAMADOL HCL 50 MG PO TABS
50.0000 mg | ORAL_TABLET | Freq: Four times a day (QID) | ORAL | Status: DC | PRN
  Administered 2016-12-19 – 2016-12-23 (×16): 50 mg via ORAL
  Filled 2016-12-19 (×16): qty 1

## 2016-12-19 MED ORDER — SODIUM CHLORIDE 0.9 % IV SOLN
INTRAVENOUS | Status: DC
  Administered 2016-12-19: 13:00:00 via INTRAVENOUS

## 2016-12-19 MED ORDER — INSULIN GLARGINE 100 UNIT/ML ~~LOC~~ SOLN
20.0000 [IU] | Freq: Every day | SUBCUTANEOUS | Status: DC
  Filled 2016-12-19: qty 0.2

## 2016-12-19 MED ORDER — FERROUS SULFATE 325 (65 FE) MG PO TABS
325.0000 mg | ORAL_TABLET | Freq: Two times a day (BID) | ORAL | Status: DC
Start: 2016-12-19 — End: 2016-12-23
  Administered 2016-12-19 – 2016-12-23 (×10): 325 mg via ORAL
  Filled 2016-12-19 (×10): qty 1

## 2016-12-19 NOTE — Progress Notes (Addendum)
[facility] Progress Note  Patient: Raymond Winters PPI:[id number]   PCP: Arnoldo Morale, MD DOB: Jan 08, 1961   DOA: 12/14/2016   DOS: 12/19/2016   Date of Service: the patient was seen and examined on 12/19/2016  Subjective: Patient denies any acute complaint. Reluctant to change his pain medications. Requesting IV pain medications to help him sleep. No nausea no vomiting. No fever no chills.  Brief hospital course: Pt. with PMH of IV drug abuse, dyslipidemia, type 2 diabetes mellitus, depression, alcohol abuse, tobacco abuse, alcoholic liver disease, CKD stage II; admitted on 12/14/2016, presented with complaint of right ankle pain, was found to have osteomyelitis of the right ankle. Currently further plan is continue IV antibiotics.  Assessment and Plan: 1. Right ankle osteomyelitis. Sepsis on admission with hypotension, tachypnea, lactic acidosis. No evidence of septic arthritis. Per operative note necrotic tissue did go down to bone that was scrapped with a curet. Currently on IV vancomycin and Zosyn since admission. Surgical culture currently pending, blood culture is actually negative since admission. MRSA PCR negative as well. ESR only 38 on admission was CRP also undetectable. Sepsis physiology currently resolved.  Addendum: Discussed with ID, since the pt does not have bacteremia, may be able to go home on oral Antibiotics depending on culture, recommends to switch to unasyn for now.   Shamar Kracke 12:18 PM 12/19/2016   2. Pain control. History of substance abuse. Cocaine abuse. Patient has history of multiple substance abuse, including alcohol as well as heroin and tobacco. Currently no evidence of withdrawal and out of the withdrawal window period as well. We will monitor. Continue nicotine patch for smoking. Patient was counseled against smoking. 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 With this and the possibility of  osteomyelitis patient will require long-term IV antibiotics. Will discuss with infectious disease. If the patient needs a PICC line he would need to be transferred to SNF. Patient was started on scheduled oxycodone by orthopedics, at present I do not see any indication for continuing scheduled pain medication in this patient and therefore will change it to when necessary only. Patient is also on very high dose of OxyIR, I will change that to Percocet. Patient likely to have a large component of neuropathic pain, on gabapentin 600 mg at night, I would add 300 mg twice a day daily regimen to that. When necessary Toradol when necessary tramadol for moderate pain control. We will not use IV narcotics in this patient, unless clearly indicated. Avoid beta blockers in this patient with recent cocaine abuse.  3. History of depression. Continuing home regimen.  4. Type 2 diabetes mellitus. Uncontrolled, hypoglycemia as well as hyperglycemia. Hemoglobin A1c 13.2 this month. Patient was actually hypoglycemic on 12/17/2016 requiring D5 infusion. Currently hypoglycemic with blood sugars running in 300s and 200s. I would continue sliding scale insulin at present. Continue scheduled Lantus as well. Monitor overnight 24 hours and agree adjust the dose tomorrow.  5. Anemia, likely nutritional deficiency. I would check iron studies, folic acid and A-63. Monitor daily CBC.  Diet: regular diet DVT Prophylaxis: subcutaneous Heparin  Advance goals of care discussion: full code  Family Communication: no family was present at bedside, at the time of interview.   Disposition:  Discharge to be determined. Consultants: orthopedics Procedures: I and D  Antibiotics: Anti-infectives    Start     Dose/Rate Route Frequency Ordered Stop   12/15/16 2000  vancomycin (VANCOCIN) IVPB 750 mg/150 ml  premix     750 mg 150 mL/hr over 60 Minutes Intravenous Every 24 hours 12/14/16 1927     12/15/16 0000   piperacillin-tazobactam (ZOSYN) IVPB 3.375 g     3.375 g 12.5 mL/hr over 240 Minutes Intravenous Every 8 hours 12/14/16 2328     12/14/16 2000  piperacillin-tazobactam (ZOSYN) IVPB 3.375 g  Status:  Discontinued     3.375 g 12.5 mL/hr over 240 Minutes Intravenous Every 8 hours 12/14/16 1927 12/14/16 2328   12/14/16 1815  vancomycin (VANCOCIN) IVPB 1000 mg/200 mL premix     1,000 mg 200 mL/hr over 60 Minutes Intravenous  Once 12/14/16 1800 12/14/16 2003       Objective: Physical Exam: Vitals:   12/18/16 1653 12/18/16 2200 12/19/16 0130 12/19/16 0450  BP: 129/85 126/82 136/81 93/79  Pulse: 89   (!) 121  Resp: '12 15 16 17  '$ Temp:  99.8 F (37.7 C) 99.1 F (37.3 C) 99.8 F (37.7 C)  TempSrc:  Oral Oral Oral  SpO2: 100% 93% 92% 92%  Weight:      Height:        Intake/Output Summary (Last 24 hours) at 12/19/16 1610 Last data filed at 12/19/16 0700  Gross per 24 hour  Intake             1504 ml  Output             1750 ml  Net             -246 ml   Filed Weights   12/14/16 1220  Weight: 49 kg (108 lb)   General: Alert, Awake and Oriented to Time, Place and Person. Appear in mild distress, affect appropriate Eyes: PERRL, Conjunctiva normal ENT: Oral Mucosa clear moist. Neck: no JVD, no Abnormal Mass Or lumps Cardiovascular: S1 and S2 Present, no Murmur, Peripheral Pulses Present Respiratory: normal respiratory effort, Bilateral Air entry equal and Decreased, no use of accessory muscle, Clear to Auscultation, no Crackles, no wheezes Abdomen: Bowel Sound present, Soft and no tenderness, no hernia Skin: no redness, no Rash, no induration Extremities: no Pedal edema, no calf tenderness Neurologic: Grossly no focal neuro deficit. Bilaterally Equal motor strength  Data Reviewed: CBC:  Recent Labs Lab 12/14/16 1232 12/15/16 0340 12/16/16 0600 12/18/16 0804 12/19/16 0344  WBC 4.5 4.1 6.0 4.5 6.9  NEUTROABS 2.2  --   --   --   --   HGB 12.5* 9.6* 10.8* 8.8* 9.4*  HCT  38.2* 29.1* 33.9* 27.5* 28.7*  MCV 76.9* 76.4* 76.9* 77.9* 76.5*  PLT 271 206 218 189 960   Basic Metabolic Panel:  Recent Labs Lab 12/14/16 2159 12/15/16 0340 12/15/16 0900 12/16/16 0600 12/17/16 0542 12/18/16 0804  NA 133* 141  --  141 139 138  K 4.7 3.1*  --  5.2* 4.9 4.7  CL 104 116*  --  114* 112* 108  CO2 23 20*  --  '22 23 27  '$ GLUCOSE 661* 36*  --  78 131* 146*  BUN 15 13  --  '10 7 7  '$ CREATININE 1.30* 1.08  --  1.02 1.18 1.28*  CALCIUM 7.8* 7.8*  --  8.1* 8.1* 8.3*  MG  --   --  1.8  --   --   --     Liver Function Tests:  Recent Labs Lab 12/14/16 1232 12/15/16 0900 12/16/16 0600 12/18/16 0804  AST 77* 50* 51* 47*  ALT '30 21 26 22  '$ ALKPHOS 112 87 98 79  BILITOT 0.5 0.5 0.4 0.3  PROT 8.2* 5.3* 5.7* 5.3*  ALBUMIN 3.3* 2.2* 2.2* 2.0*   No results for input(s): LIPASE, AMYLASE in the last 168 hours. No results for input(s): AMMONIA in the last 168 hours. Coagulation Profile: No results for input(s): INR, PROTIME in the last 168 hours. Cardiac Enzymes: No results for input(s): CKTOTAL, CKMB, CKMBINDEX, TROPONINI in the last 168 hours. BNP (last 3 results) No results for input(s): PROBNP in the last 8760 hours. CBG:  Recent Labs Lab 12/18/16 1604 12/18/16 1646 12/18/16 2200 12/18/16 2350 12/19/16 0625  GLUCAP 89 99 324* 372* 275*   Studies: No results found.  Scheduled Meds: . aspirin EC  81 mg Oral Daily  . atorvastatin  40 mg Oral Daily  . ferrous sulfate  325 mg Oral BID WC  . gabapentin  300 mg Oral BID  . gabapentin  600 mg Oral QHS  . insulin aspart  0-9 Units Subcutaneous TID WC  . insulin glargine  15 Units Subcutaneous QHS  . lipase/protease/amylase  24,000 Units Oral TID AC  . mirtazapine  30 mg Oral QHS  . multivitamin with minerals  1 tablet Oral Daily  . nicotine  21 mg Transdermal Daily   Continuous Infusions: . sodium chloride    . piperacillin-tazobactam (ZOSYN)  IV Stopped (12/19/16 0357)  . vancomycin Stopped (12/18/16  2201)   PRN Meds: hydrALAZINE, ketorolac, ondansetron, oxyCODONE-acetaminophen **AND** oxyCODONE, traMADol, zolpidem  Time spent: 35 minutes  Author: Berle Mull, MD [facility] Pager: [phone] 12/19/2016 9:24 AM  If 7PM-7AM, please contact night-coverage at [email or web address], password [email or web address]

## 2016-12-19 NOTE — Progress Notes (Signed)
Pharmacy Antibiotic Note  Raymond Winters is a 56 y.o. male admitted on 12/14/2016 with Osteomyelitis with cultures negative to date.  Pharmacy has been consulted for narrowing antibiotics to Unasyn.  Plan: Unasyn 3g IV q8  Height: 5\' 7"  (170.2 cm) Weight: 108 lb (49 kg) IBW/kg (Calculated) : 66.1  Temp (24hrs), Avg:98.7 F (37.1 C), Min:97.4 F (36.3 C), Max:99.8 F (37.7 C)   Recent Labs Lab 12/14/16 1232 12/14/16 1248 12/14/16 1954 12/14/16 2159 12/15/16 0049 12/15/16 0340 12/16/16 0600 12/17/16 0542 12/18/16 0804 12/19/16 0344  WBC 4.5  --   --   --   --  4.1 6.0  --  4.5 6.9  CREATININE 1.23  --   --  1.30*  --  1.08 1.02 1.18 1.28*  --   LATICACIDVEN  --  2.50* 4.4* 4.6* 3.6* 1.9  --   --   --   --     Estimated Creatinine Clearance: 44.1 mL/min (A) (by C-G formula based on SCr of 1.28 mg/dL (H)).    Allergies  Allergen Reactions  . Ativan [Lorazepam] Other (See Comments)    HALLUCINATIONS    Antimicrobials this admission: Vanc 6/25 >> 6/30 Zosyn 6/26 >> 6/30 Unasyn 6/30 >>  Microbiology results: 6/25 BCx: ntd   Thank you for allowing pharmacy to be a part of this patient's care.  Alvester MorinKendra Ayano Douthitt, B.S., PharmD Clinical Pharmacist [facility]

## 2016-12-19 NOTE — Progress Notes (Addendum)
Call received from central telemetry that patient heart rate is in the 140's rhythm sinus tachycardia. Upon entering room patient was found in the bathroom brushing his teeth. Patient assisted to chair and made comfortable. Patient informed to call before getting out of bed. Patient verbalizes understanding. Patient returned to bed and Heart rate slowing down gradually. Will continue to monitor

## 2016-12-19 NOTE — Progress Notes (Signed)
Physical Therapy Treatment Patient Details Name: Raymond ButteryGary Winters MRN: [id number] DOB: [birth year] Today's Date: 12/19/2016    History of Present Illness Pt is a 56 y/o M with cellulitis of R ankle. PMH of HLD, DM, depression, alcohol abuse, CKD, liver disease, Hep C.     PT Comments    Pt demonstrates improved tolerance for gait this session. Continues to have discomfort with gait, but is able to increase distance and speed this session. Pt's gait speed is approaching normal. Pt reports he may need to go to SNF for IV antibiotics. Pt is moving fairly well from a therapy standpoint with minimal balance issues noted.     Follow Up Recommendations  No PT follow up     Equipment Recommendations  None recommended by PT    Recommendations for Other Services       Precautions / Restrictions Precautions Precautions: Fall Restrictions Weight Bearing Restrictions: Yes RLE Weight Bearing: Weight bearing as tolerated    Mobility  Bed Mobility               General bed mobility comments: sitting in recliner when PT arrives  Transfers Overall transfer level: Needs assistance Equipment used: None Transfers: Sit to/from Stand Sit to Stand: Supervision         General transfer comment: supervision for safety  Ambulation/Gait Ambulation/Gait assistance: Min guard Ambulation Distance (Feet): 500 Feet Assistive device: Straight cane Gait Pattern/deviations: Step-through pattern;Antalgic Gait velocity: decreased Gait velocity interpretation: Below normal speed for age/gender General Gait Details: Mild antalgic gait, improved use of [place] with gait. Antalgia increases as distance increases   Stairs            Wheelchair Mobility    Modified Rankin (Stroke Patients Only)       Balance Overall balance assessment: Needs assistance Sitting-balance support: Feet supported Sitting balance-Leahy Scale: Good     Standing balance support: No upper extremity supported;During  functional activity Standing balance-Leahy Scale: Fair                              Cognition Arousal/Alertness: Awake/alert Behavior During Therapy: Impulsive;WFL for tasks assessed/performed Overall Cognitive Status: Within Functional Limits for tasks assessed                                        Exercises      General Comments        Pertinent Vitals/Pain Pain Assessment: 0-10 Pain Score: 5  Pain Location: R ankle  Pain Descriptors / Indicators: Discomfort Pain Intervention(s): Monitored during session;Premedicated before session;Repositioned    Home Living                      Prior Function            PT Goals (current goals can now be found in the care plan section) Acute Rehab PT Goals Patient Stated Goal: to go home Progress towards PT goals: Progressing toward goals    Frequency    Min 2X/week      PT Plan Current plan remains appropriate    Co-evaluation              AM-PAC PT "6 Clicks" Daily Activity  Outcome Measure  Difficulty turning over in bed (including adjusting bedclothes, sheets and blankets)?: None Difficulty moving from lying on back to sitting  on the side of the bed? : None Difficulty sitting down on and standing up from a chair with arms (e.g., wheelchair, bedside commode, etc,.)?: None Help needed moving to and from a bed to chair (including a wheelchair)?: A Little Help needed walking in hospital room?: A Little Help needed climbing 3-5 steps with a railing? : A Little 6 Click Score: 21    End of Session Equipment Utilized During Treatment: Gait belt Activity Tolerance: Patient tolerated treatment well Patient left: in chair;with call bell/phone within reach Nurse Communication: Mobility status PT Visit Diagnosis: Other abnormalities of gait and mobility (R26.89);Muscle weakness (generalized) (M62.81)     Time: 1536-1600 PT Time Calculation (min) (ACUTE ONLY): 24  min  Charges:  $Gait Training: 23-37 mins                    G Codes:       Raymond Winters PT, DPT  [phone]    Raymond Winters Raymond Winters 12/19/2016, 4:12 PM

## 2016-12-19 NOTE — Anesthesia Postprocedure Evaluation (Signed)
Anesthesia Post Note  Patient: Raymond Winters  Procedure(s) Performed: Procedure(s) (LRB): INCISION AND DRAINAGE ANKLE ABSCESS (Right)     Patient location during evaluation: PACU Anesthesia Type: General Level of consciousness: sedated and patient cooperative Pain management: pain level controlled Vital Signs Assessment: post-procedure vital signs reviewed and stable Respiratory status: spontaneous breathing Cardiovascular status: stable Anesthetic complications: no    Last Vitals:  Vitals:   12/19/16 0130 12/19/16 0450  BP: 136/81 93/79  Pulse:  (!) 121  Resp: 16 17  Temp: 37.3 C 37.7 C    Last Pain:  Vitals:   12/19/16 0548  TempSrc:   PainSc: 8                  Lewie LoronJohn Gurman [place]

## 2016-12-19 NOTE — Progress Notes (Signed)
RN notified [facility] MD regarding patients blood sugar 53. MD returned call and advised RN to hold night time Lantus dose and to give patient orange juice and ice cream. Nursing will continue to monitor.

## 2016-12-20 ENCOUNTER — Encounter (HOSPITAL_COMMUNITY): Payer: Self-pay | Admitting: Orthopedic Surgery

## 2016-12-20 LAB — BASIC METABOLIC PANEL
ANION GAP: 6 (ref 5–15)
Anion gap: 4 — ABNORMAL LOW (ref 5–15)
BUN: 15 mg/dL (ref 6–20)
BUN: 14 mg/dL (ref 6–20)
CALCIUM: 7.9 mg/dL — AB (ref 8.9–10.3)
CALCIUM: 8.1 mg/dL — AB (ref 8.9–10.3)
CHLORIDE: 102 mmol/L (ref 101–111)
CO2: 27 mmol/L (ref 22–32)
CO2: 24 mmol/L (ref 22–32)
CREATININE: 1.8 mg/dL — AB (ref 0.61–1.24)
Chloride: 102 mmol/L (ref 101–111)
Creatinine, Ser: 1.72 mg/dL — ABNORMAL HIGH (ref 0.61–1.24)
GFR calc non Af Amer: 40 mL/min — ABNORMAL LOW (ref >60)
GFR, EST AFRICAN AMERICAN: 46 mL/min — AB (ref >60)
GFR, EST AFRICAN AMERICAN: 49 mL/min — AB (ref >60)
GFR, EST NON AFRICAN AMERICAN: 42 mL/min — AB (ref >60)
Glucose, Bld: 496 mg/dL — ABNORMAL HIGH (ref 65–99)
Glucose, Bld: 576 mg/dL (ref 65–99)
POTASSIUM: 5.8 mmol/L — AB (ref 3.5–5.1)
Potassium: 6.5 mmol/L (ref 3.5–5.1)
SODIUM: 133 mmol/L — AB (ref 135–145)
Sodium: 132 mmol/L — ABNORMAL LOW (ref 135–145)

## 2016-12-20 LAB — CBC
HCT: 27 % — ABNORMAL LOW (ref 39.0–52.0)
HEMOGLOBIN: 8.4 g/dL — AB (ref 13.0–17.0)
MCH: 24.3 pg — ABNORMAL LOW (ref 26.0–34.0)
MCHC: 31.1 g/dL (ref 30.0–36.0)
MCV: 78.3 fL (ref 78.0–100.0)
Platelets: 173 10*3/uL (ref 150–400)
RBC: 3.45 MIL/uL — ABNORMAL LOW (ref 4.22–5.81)
RDW: 14.4 % (ref 11.5–15.5)
WBC: 3.7 10*3/uL — AB (ref 4.0–10.5)

## 2016-12-20 LAB — GLUCOSE, CAPILLARY
GLUCOSE-CAPILLARY: 551 mg/dL — AB (ref 65–99)
GLUCOSE-CAPILLARY: 270 mg/dL — AB (ref 65–99)
GLUCOSE-CAPILLARY: 190 mg/dL — AB (ref 65–99)
Glucose-Capillary: 87 mg/dL (ref 65–99)
Glucose-Capillary: 54 mg/dL — ABNORMAL LOW (ref 65–99)
Glucose-Capillary: 170 mg/dL — ABNORMAL HIGH (ref 65–99)

## 2016-12-20 LAB — POTASSIUM
Potassium: 4.6 mmol/L (ref 3.5–5.1)
Potassium: 4.6 mmol/L (ref 3.5–5.1)
Potassium: 4.3 mmol/L (ref 3.5–5.1)

## 2016-12-20 LAB — MAGNESIUM: MAGNESIUM: 1.9 mg/dL (ref 1.7–2.4)

## 2016-12-20 MED ORDER — SODIUM POLYSTYRENE SULFONATE 15 GM/60ML PO SUSP
15.0000 g | Freq: Once | ORAL | Status: AC
  Administered 2016-12-20: 15 g via ORAL
  Filled 2016-12-20: qty 60

## 2016-12-20 MED ORDER — INSULIN ASPART 100 UNIT/ML ~~LOC~~ SOLN
0.0000 [IU] | Freq: Three times a day (TID) | SUBCUTANEOUS | Status: DC
  Administered 2016-12-20: 5 [IU] via SUBCUTANEOUS
  Administered 2016-12-21: 3 [IU] via SUBCUTANEOUS
  Administered 2016-12-21 – 2016-12-22 (×2): 7 [IU] via SUBCUTANEOUS

## 2016-12-20 MED ORDER — SENNOSIDES-DOCUSATE SODIUM 8.6-50 MG PO TABS: 1.0000 | ORAL_TABLET | Freq: Two times a day (BID) | ORAL | Status: DC | PRN

## 2016-12-20 MED ORDER — INSULIN GLARGINE 100 UNIT/ML ~~LOC~~ SOLN
10.0000 [IU] | Freq: Two times a day (BID) | SUBCUTANEOUS | Status: DC
  Administered 2016-12-20 – 2016-12-21 (×2): 10 [IU] via SUBCUTANEOUS
  Filled 2016-12-20 (×3): qty 0.1

## 2016-12-20 MED ORDER — POLYETHYLENE GLYCOL 3350 17 G PO PACK
17.0000 g | PACK | Freq: Every day | ORAL | Status: DC
  Administered 2016-12-20 – 2016-12-23 (×4): 17 g via ORAL
  Filled 2016-12-20 (×4): qty 1

## 2016-12-20 MED ORDER — SODIUM CHLORIDE 0.9 % IV BOLUS (SEPSIS)
500.0000 mL | Freq: Once | INTRAVENOUS | Status: AC
  Administered 2016-12-20: 500 mL via INTRAVENOUS

## 2016-12-20 MED ORDER — INSULIN ASPART 100 UNIT/ML ~~LOC~~ SOLN
0.0000 [IU] | Freq: Every day | SUBCUTANEOUS | Status: DC
  Administered 2016-12-21: 3 [IU] via SUBCUTANEOUS

## 2016-12-20 NOTE — Progress Notes (Signed)
CBG 54. Patient alert and oriented, no distress. Will give orange juice and continue to monitor. Asher Muir- Chirstopher Iovino,RN

## 2016-12-20 NOTE — Progress Notes (Signed)
Dr. Allena KatzPatel advised RN to administer 15 units of novolog for patients 551 blood sugar reading with a STAT basal metabolic panel lab draw. Nursing will continue to monitor.

## 2016-12-20 NOTE — Progress Notes (Signed)
Subjective: 2 Days Post-Op Procedure(s) (LRB): INCISION AND DRAINAGE ANKLE ABSCESS (Right) Patient reports pain as mild and moderate.    Objective: Vital signs in last 24 hours: Temp:  [97.5 F (36.4 C)-98.1 F (36.7 C)] 97.5 F (36.4 C) (07/01 1557) Pulse Rate:  [78-97] 97 (07/01 1557) Resp:  [16] 16 (07/01 1557) BP: (113-121)/(74-86) 121/86 (07/01 1557) SpO2:  [99 %-100 %] 100 % (07/01 1557)  Intake/Output from previous day: 06/30 0701 - 07/01 0700 In: 1161.7 [P.O.:240; I.V.:721.7; IV Piggyback:200] Out: 700 [Urine:700] Intake/Output this shift: Total I/O In: 840 [P.O.:840] Out: -    Recent Labs  12/18/16 0804 12/19/16 0344 12/20/16 0336  HGB 8.8* 9.4* 8.4*    Recent Labs  12/19/16 0344 12/20/16 0336  WBC 6.9 3.7*  RBC 3.75* 3.45*  HCT 28.7* 27.0*  PLT 222 173    Recent Labs  12/20/16 0336 12/20/16 0738 12/20/16 1128  NA 133* 132*  --   K 6.5* 5.8* 4.6  CL 102 102  --   CO2 27 24  --   BUN 15 14  --   CREATININE 1.80* 1.72*  --   GLUCOSE 496* 576*  --   CALCIUM 7.9* 8.1*  --    No results for input(s): LABPT, INR in the last 72 hours.  Neurovascular intact Sensation intact distally Intact pulses distally Dorsiflexion/Plantar flexion intact Incision: moderate drainage on dsg but no active drainage Wound packing pulled and wound redressed with dry dsg  Assessment/Plan: 2 Days Post-Op Procedure(s) (LRB): INCISION AND DRAINAGE ANKLE ABSCESS (Right) Up with therapy  wbat rle dsg change daily dry dsg Ortho sign off, f/u 1-2 wks   Cont abx, pain control, d/c planning per primary team  Margart SicklesChadwell, Cyndy Braver 12/20/2016, 4:27 PM

## 2016-12-20 NOTE — Progress Notes (Signed)
[facility] Progress Note  Patient: Raymond Winters DQQ:[id number]   PCP: Arnoldo Morale, MD DOB: 08/05/1960   DOA: 12/14/2016   DOS: 12/20/2016   Date of Service: the patient was seen and examined on 12/20/2016  Subjective: Patient denies any acute complaint. Pain is well controlled and does not want to change his regimen today. Has some constipation no nausea or vomiting, no abd pain.  Brief hospital course: Pt. with PMH of IV drug abuse, dyslipidemia, type 2 diabetes mellitus, depression, alcohol abuse, tobacco abuse, alcoholic liver disease, CKD stage II; admitted on 12/14/2016, presented with complaint of right ankle pain, was found to have osteomyelitis of the right ankle. Currently further plan is continue IV antibiotics.  Assessment and Plan: 1. Right ankle osteomyelitis. Sepsis on admission with hypotension, tachypnea, lactic acidosis. No evidence of septic arthritis. Per operative note necrotic tissue did go down to bone that was scrapped with a curet. Was on IV vancomycin and Zosyn since admission. Surgical culture currently pending, blood culture is actually negative since admission. MRSA PCR negative as well. ESR only 38 on admission was CRP also undetectable. Sepsis physiology currently resolved. Discussed with ID, since the pt does not have bacteremia, may be able to go home on oral Antibiotics depending on culture,  recommends to switch to unasyn for now.   2. Pain control. History of substance abuse. Cocaine abuse. Patient has history of multiple substance abuse, including alcohol as well as heroin and tobacco. Currently no evidence of withdrawal and out of the withdrawal window period as well. We will monitor. Continue nicotine patch for smoking. Patient was counseled against smoking. 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 Patient was started on scheduled oxycodone by orthopedics, I do not see any indication for  continuing scheduled pain medication in this patient and therefore will change it to when necessary only. Patient is also on very high dose of OxyIR, changed that to Percocet. Patient likely to have a large component of neuropathic pain, on gabapentin 600 mg at night, I added 300 mg twice a day daily regimen to that. When necessary Toradol is on hold due to [facility]  when necessary tramadol for moderate pain control. We will not use IV narcotics in this patient, unless clearly indicated. Avoid beta blockers in this patient with recent cocaine abuse.  3. AKI Hyperkalemia  Likely from polyurea with hyperglycemia. Started on IV hydration 12/19/2016, will add bolus and increase the rate.  Currently trending down already  Give Kayexalate for elevated potassium, EKG unremarkable  4. Type 2 diabetes mellitus. Uncontrolled, hypoglycemia as well as hyperglycemia. Hemoglobin A1c 13.2 this month. Patient was actually hypoglycemic on 12/17/2016 requiring D5 infusion. Currently hyperglycemic. Appears to be sensitive to short acting insulin.  I would continue sliding scale insulin at present, change back to sensitive. Continue scheduled Lantus as well, change dose to half and make it BID. Yesterday's dose was held last night which is the cause of today's hyperglycemia   5. Anemia, likely Iron deficiency. Low iron studies, normal folic acid and J-94. Monitor daily CBC. Add iron   6. History of depression. Continuing home regimen.  Diet: carb modified  DVT Prophylaxis: subcutaneous Heparin  Advance goals of care discussion: full code  Family Communication: no family was present at bedside, at the time of interview.   Disposition:  Discharge to home.  Consultants: orthopedics Procedures: I and D  Antibiotics: Anti-infectives    Start  Dose/Rate Route Frequency Ordered Stop   12/19/16 1100  Ampicillin-Sulbactam (UNASYN) 3 g in sodium chloride 0.9 % 100 mL IVPB     3 g 200 mL/hr over 30  Minutes Intravenous Every 8 hours 12/19/16 1011     12/15/16 2000  vancomycin (VANCOCIN) IVPB 750 mg/150 ml premix  Status:  Discontinued     750 mg 150 mL/hr over 60 Minutes Intravenous Every 24 hours 12/14/16 1927 12/19/16 0959   12/15/16 0000  piperacillin-tazobactam (ZOSYN) IVPB 3.375 g  Status:  Discontinued     3.375 g 12.5 mL/hr over 240 Minutes Intravenous Every 8 hours 12/14/16 2328 12/19/16 0959   12/14/16 2000  piperacillin-tazobactam (ZOSYN) IVPB 3.375 g  Status:  Discontinued     3.375 g 12.5 mL/hr over 240 Minutes Intravenous Every 8 hours 12/14/16 1927 12/14/16 2328   12/14/16 1815  vancomycin (VANCOCIN) IVPB 1000 mg/200 mL premix     1,000 mg 200 mL/hr over 60 Minutes Intravenous  Once 12/14/16 1800 12/14/16 2003     Objective: Physical Exam: Vitals:   12/19/16 0450 12/19/16 1834 12/19/16 2109 12/20/16 0551  BP: 93/79 113/74 120/81 116/75  Pulse: (!) 121 92 94 78  Resp: '17 16 16 16  '$ Temp: 99.8 F (37.7 C) 97.9 F (36.6 C) 98.1 F (36.7 C) 97.5 F (36.4 C)  TempSrc: Oral Oral Oral Oral  SpO2: 92% 99% 99% 99%  Weight:      Height:        Intake/Output Summary (Last 24 hours) at 12/20/16 0934 Last data filed at 12/20/16 0551  Gross per 24 hour  Intake          1161.67 ml  Output              700 ml  Net           461.67 ml   Filed Weights   12/14/16 1220  Weight: 49 kg (108 lb)   General: Alert, Awake and Oriented to Time, Place and Person. Appear in mild distress, affect appropriate Eyes: PERRL, Conjunctiva normal ENT: Oral Mucosa clear moist. Neck: no JVD, no Abnormal Mass Or lumps Cardiovascular: S1 and S2 Present, no Murmur, Peripheral Pulses Present Respiratory: normal respiratory effort, Bilateral Air entry equal and Decreased, no use of accessory muscle, Clear to Auscultation, no Crackles, no wheezes Abdomen: Bowel Sound present, Soft and no tenderness, no hernia Skin: no redness, no Rash, no induration Extremities: no Pedal edema, no calf  tenderness Neurologic: Grossly no focal neuro deficit. Bilaterally Equal motor strength  Data Reviewed: CBC:  Recent Labs Lab 12/14/16 1232 12/15/16 0340 12/16/16 0600 12/18/16 0804 12/19/16 0344 12/20/16 0336  WBC 4.5 4.1 6.0 4.5 6.9 3.7*  NEUTROABS 2.2  --   --   --   --   --   HGB 12.5* 9.6* 10.8* 8.8* 9.4* 8.4*  HCT 38.2* 29.1* 33.9* 27.5* 28.7* 27.0*  MCV 76.9* 76.4* 76.9* 77.9* 76.5* 78.3  PLT 271 206 218 189 222 109   Basic Metabolic Panel:  Recent Labs Lab 12/15/16 0900 12/16/16 0600 12/17/16 0542 12/18/16 0804 12/20/16 0336 12/20/16 0738  NA  --  141 139 138 133* 132*  K  --  5.2* 4.9 4.7 6.5* 5.8*  CL  --  114* 112* 108 102 102  CO2  --  '22 23 27 27 24  '$ GLUCOSE  --  78 131* 146* 496* 576*  BUN  --  '10 7 7 15 14  '$ CREATININE  --  1.02 1.18  1.28* 1.80* 1.72*  CALCIUM  --  8.1* 8.1* 8.3* 7.9* 8.1*  MG 1.8  --   --   --  1.9  --     Liver Function Tests:  Recent Labs Lab 12/14/16 1232 12/15/16 0900 12/16/16 0600 12/18/16 0804  AST 77* 50* 51* 47*  ALT '30 21 26 22  '$ ALKPHOS 112 87 98 79  BILITOT 0.5 0.5 0.4 0.3  PROT 8.2* 5.3* 5.7* 5.3*  ALBUMIN 3.3* 2.2* 2.2* 2.0*   No results for input(s): LIPASE, AMYLASE in the last 168 hours. No results for input(s): AMMONIA in the last 168 hours. Coagulation Profile: No results for input(s): INR, PROTIME in the last 168 hours. Cardiac Enzymes: No results for input(s): CKTOTAL, CKMB, CKMBINDEX, TROPONINI in the last 168 hours. BNP (last 3 results) No results for input(s): PROBNP in the last 8760 hours. CBG:  Recent Labs Lab 12/19/16 1728 12/19/16 2014 12/19/16 2106 12/19/16 2316 12/20/16 0657  GLUCAP 399* 53* 79 177* 551*   Studies: No results found.  Scheduled Meds: . aspirin EC  81 mg Oral Daily  . atorvastatin  40 mg Oral Daily  . ferrous sulfate  325 mg Oral BID WC  . gabapentin  300 mg Oral BID  . gabapentin  600 mg Oral QHS  . insulin aspart  0-5 Units Subcutaneous QHS  . insulin aspart   0-9 Units Subcutaneous TID WC  . insulin glargine  10 Units Subcutaneous BID  . lipase/protease/amylase  24,000 Units Oral TID AC  . mirtazapine  30 mg Oral QHS  . multivitamin with minerals  1 tablet Oral Daily  . nicotine  21 mg Transdermal Daily  . polyethylene glycol  17 g Oral Daily  . sodium polystyrene  15 g Oral Once   Continuous Infusions: . sodium chloride 50 mL/hr at 12/19/16 1237  . ampicillin-sulbactam (UNASYN) IV Stopped (12/20/16 0721)  . sodium chloride     PRN Meds: hydrALAZINE, ondansetron, oxyCODONE-acetaminophen **AND** oxyCODONE, senna-docusate, traMADol, zolpidem  Time spent: 35 minutes  Author: Berle Mull, MD [facility] Pager: [phone] 12/20/2016 9:34 AM  If 7PM-7AM, please contact night-coverage at [email or web address], password [facility]

## 2016-12-20 NOTE — Progress Notes (Signed)
CRITICAL VALUE ALERT  Critical Value:  POTASSIUM 6.5  Date & Time Notied:  12/20/2016 @ 0559  Provider Notified: [facility]  Orders Received/Actions taken: STAT EKG

## 2016-12-20 NOTE — Progress Notes (Signed)
RN notified Hospitalists Dr. Allena KatzPatel of patients blood sugar 551. Nursing will continue to monitor.

## 2016-12-21 ENCOUNTER — Ambulatory Visit: Payer: Medicare Other | Admitting: Family Medicine

## 2016-12-21 LAB — GLUCOSE, CAPILLARY
GLUCOSE-CAPILLARY: 303 mg/dL — AB (ref 65–99)
GLUCOSE-CAPILLARY: 178 mg/dL — AB (ref 65–99)
GLUCOSE-CAPILLARY: 200 mg/dL — AB (ref 65–99)
Glucose-Capillary: 309 mg/dL — ABNORMAL HIGH (ref 65–99)
Glucose-Capillary: 70 mg/dL (ref 65–99)
Glucose-Capillary: 91 mg/dL (ref 65–99)
Glucose-Capillary: 219 mg/dL — ABNORMAL HIGH (ref 65–99)

## 2016-12-21 LAB — BASIC METABOLIC PANEL
Anion gap: 3 — ABNORMAL LOW (ref 5–15)
BUN: 14 mg/dL (ref 6–20)
CHLORIDE: 107 mmol/L (ref 101–111)
CO2: 27 mmol/L (ref 22–32)
Calcium: 7.9 mg/dL — ABNORMAL LOW (ref 8.9–10.3)
Creatinine, Ser: 1.47 mg/dL — ABNORMAL HIGH (ref 0.61–1.24)
GFR calc non Af Amer: 51 mL/min — ABNORMAL LOW (ref >60)
GFR, EST AFRICAN AMERICAN: 59 mL/min — AB (ref >60)
Glucose, Bld: 335 mg/dL — ABNORMAL HIGH (ref 65–99)
Potassium: 5 mmol/L (ref 3.5–5.1)
SODIUM: 137 mmol/L (ref 135–145)

## 2016-12-21 LAB — POTASSIUM
POTASSIUM: 4.4 mmol/L (ref 3.5–5.1)
POTASSIUM: 4.8 mmol/L (ref 3.5–5.1)
POTASSIUM: 4.8 mmol/L (ref 3.5–5.1)
POTASSIUM: 5 mmol/L (ref 3.5–5.1)
Potassium: 4.9 mmol/L (ref 3.5–5.1)

## 2016-12-21 LAB — CBC
HEMATOCRIT: 27.4 % — AB (ref 39.0–52.0)
HEMOGLOBIN: 8.5 g/dL — AB (ref 13.0–17.0)
MCH: 24.4 pg — AB (ref 26.0–34.0)
MCHC: 31 g/dL (ref 30.0–36.0)
MCV: 78.7 fL (ref 78.0–100.0)
Platelets: 182 10*3/uL (ref 150–400)
RBC: 3.48 MIL/uL — AB (ref 4.22–5.81)
RDW: 14.4 % (ref 11.5–15.5)
WBC: 4.3 10*3/uL (ref 4.0–10.5)

## 2016-12-21 MED ORDER — INSULIN GLARGINE 100 UNIT/ML ~~LOC~~ SOLN
25.0000 [IU] | Freq: Every day | SUBCUTANEOUS | Status: DC
  Administered 2016-12-22: 25 [IU] via SUBCUTANEOUS
  Filled 2016-12-21 (×2): qty 0.25

## 2016-12-21 MED ORDER — INSULIN GLARGINE 100 UNIT/ML ~~LOC~~ SOLN
10.0000 [IU] | Freq: Once | SUBCUTANEOUS | Status: DC
  Filled 2016-12-21: qty 0.1

## 2016-12-21 MED ORDER — SODIUM CHLORIDE 0.9 % IV SOLN: INTRAVENOUS | Status: AC

## 2016-12-21 NOTE — Progress Notes (Signed)
Pharmacy Antibiotic Note  Raymond ButteryGary Winters is a 56 y.o. male admitted on 12/14/2016 with Osteomyelitis with cultures negative to date.  Pharmacy has been consulted for narrowing antibiotics to Unasyn.  Continues on Unasyn for osteomyelitis, although no bone involvement on imaging. S/p I&D.  Afebrile, WBC wnl. PCT unimpressive.  Plan: Continue Unasyn 3g IV Q8h Monitor clinical picture, renal function F/U C&S,  LOT   Height: 5\' 7"  (170.2 cm) Weight: 108 lb (49 kg) IBW/kg (Calculated) : 66.1  Temp (24hrs), Avg:98 F (36.7 C), Min:97.5 F (36.4 C), Max:98.4 F (36.9 C)   Recent Labs Lab 12/14/16 1248 12/14/16 1954 12/14/16 2159 12/15/16 0049 12/15/16 0340 12/16/16 0600 12/17/16 0542 12/18/16 0804 12/19/16 0344 12/20/16 0336 12/20/16 0738 12/21/16 0355  WBC  --   --   --   --  4.1 6.0  --  4.5 6.9 3.7*  --  4.3  CREATININE  --   --  1.30*  --  1.08 1.02 1.18 1.28*  --  1.80* 1.72* 1.47*  LATICACIDVEN 2.50* 4.4* 4.6* 3.6* 1.9  --   --   --   --   --   --   --     Estimated Creatinine Clearance: 38.4 mL/min (A) (by C-G formula based on SCr of 1.47 mg/dL (H)).    Allergies  Allergen Reactions  . Ativan [Lorazepam] Other (See Comments)    HALLUCINATIONS    Thank you for allowing pharmacy to be a part of this patient's care.  Enzo BiNathan Raydon Chappuis, PharmD, BCPS Clinical Pharmacist Pager [phone] 12/21/2016 9:40 AM

## 2016-12-21 NOTE — Progress Notes (Signed)
PT Cancellation Note  Patient Details Name: Raymond ButteryGary Winters MRN: [id number] DOB: Jun 01, 1961   Cancelled Treatment:    Reason Eval/Treat Not Completed: Other (comment). Pt deferred despite max encouragement due to nausea and upset stomach. Attempted later in PM and pt sleeping and deferred again. PT to return as able.   Velma Hanna M Louann Hopson 12/21/2016, 3:42 PM  Lewis ShockAshly Kaytee Taliercio, PT, DPT Pager #: [phone] Office #: [phone]

## 2016-12-21 NOTE — Progress Notes (Signed)
[facility] Progress Note  Patient: Raymond Winters YTQ:[id number]   PCP: Jaclyn Shaggy, MD DOB: 17-Jun-1961   DOA: 12/14/2016   DOS: 12/21/2016   Date of Service: the patient was seen and examined on 12/21/2016  Subjective:  accuchecks are very labile ,  No nausea no vomiting. No fever no chills.  Brief hospital course: Pt. with PMH of IV drug abuse, dyslipidemia, type 2 diabetes mellitus, depression, alcohol abuse, tobacco abuse, alcoholic liver disease, CKD stage II; admitted on 12/14/2016, presented with complaint of right ankle pain, was found to have osteomyelitis of the right ankle. Currently further plan is continue IV antibiotics.  Assessment and Plan: 1.Tenosynovitis/cellulitis of the right ankle status post irrigation and debridement on 6/29 Sepsis on admission with hypotension, tachypnea, lactic acidosis. No evidence of septic arthritis. Per operative note necrotic tissue did go down to bone that was scrapped with a curet. Received IV vancomycin and Zosyn, now transition to Unasyn Surgical culture currently pending, blood culture is actually negative since admission. MRSA PCR negative as well.  Follow sensitivity panel from wound culture and adjust antibiotics accordingly ESR only 38 on admission was CRP also undetectable.       2. Pain control. History of substance abuse. Cocaine abuse. Patient has history of multiple substance abuse, including alcohol as well as heroin and tobacco. Currently no evidence of withdrawal and out of the withdrawal window period as well. We will monitor. Continue nicotine patch for smoking. Patient was counseled against smoking. 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 Patient was started on scheduled oxycodone by orthopedics, at present I do not see any indication for continuing scheduled pain medication in this patient and therefore will change it to when necessary only. Continue  Percocet Gabapentin added for neuropathic pain Toradol discontinued due to acute kidney injury, avoid NSAIDs Use tramadol as needed for pain  3. History of depression. Continuing home regimen.  4. Type 2 diabetes mellitus. Fluctuating between low and high sugars,increase frequency of accuchecks to q 3 hrs Hemoglobin A1c 13.2 this month. Patient was actually hypoglycemic on 12/17/2016 requiring D5 infusion. Currently hyperglycemic with CBGs in the 300s. Adjust dose of Lantus to 20 units once a day, 25 units tomorrow morning, continue sliding scale insulin Monitor Accu-Cheks for another 24 hours prior to discharge   5. Anemia, likely nutritional deficiency./Anemia of chronic disease Currently 8.5 Folic acid and B-12 normal  6 acute kidney injury Creatinine was 1.08, increased to 1.8 on 7/1 Suspect due to dehydration/hyperglycemia Avoid NSAIDs Continue IV hydration, slowly improving   Diet: regular diet DVT Prophylaxis: subcutaneous Heparin  Advance goals of care discussion: full code  Family Communication: no family was present at bedside, at the time of interview.   Disposition:  Likely 1-2 days  Consultants: orthopedics Procedures: I and D  Antibiotics: Anti-infectives    Start     Dose/Rate Route Frequency Ordered Stop   12/19/16 1100  Ampicillin-Sulbactam (UNASYN) 3 g in sodium chloride 0.9 % 100 mL IVPB     3 g 200 mL/hr over 30 Minutes Intravenous Every 8 hours 12/19/16 1011     12/15/16 2000  vancomycin (VANCOCIN) IVPB 750 mg/150 ml premix  Status:  Discontinued     750 mg 150 mL/hr over 60 Minutes Intravenous Every 24 hours 12/14/16 1927 12/19/16 0959   12/15/16 0000  piperacillin-tazobactam (ZOSYN) IVPB 3.375 g  Status:  Discontinued     3.375 g 12.5 mL/hr over 240  Minutes Intravenous Every 8 hours 12/14/16 2328 12/19/16 0959   12/14/16 2000  piperacillin-tazobactam (ZOSYN) IVPB 3.375 g  Status:  Discontinued     3.375 g 12.5 mL/hr over 240 Minutes  Intravenous Every 8 hours 12/14/16 1927 12/14/16 2328   12/14/16 1815  vancomycin (VANCOCIN) IVPB 1000 mg/200 mL premix     1,000 mg 200 mL/hr over 60 Minutes Intravenous  Once 12/14/16 1800 12/14/16 2003       Objective: Physical Exam: Vitals:   12/20/16 0551 12/20/16 1557 12/20/16 2132 12/21/16 0642  BP: 116/75 121/86 139/77 (!) 145/85  Pulse: 78 97 (!) 102 81  Resp: '16 16 16 16  '$ Temp: 97.5 F (36.4 C) 97.5 F (36.4 C) 98.2 F (36.8 C) 98.4 F (36.9 C)  TempSrc: Oral Axillary Oral Oral  SpO2: 99% 100% 100% 100%  Weight:      Height:        Intake/Output Summary (Last 24 hours) at 12/21/16 1030 Last data filed at 12/21/16 0700  Gross per 24 hour  Intake              480 ml  Output                0 ml  Net              480 ml   Filed Weights   12/14/16 1220  Weight: 49 kg (108 lb)   General: Alert, Awake and Oriented to Time, Place and Person. Appear in mild distress, affect appropriate Eyes: PERRL, Conjunctiva normal ENT: Oral Mucosa clear moist. Neck: no JVD, no Abnormal Mass Or lumps Cardiovascular: S1 and S2 Present, no Murmur, Peripheral Pulses Present Respiratory: normal respiratory effort, Bilateral Air entry equal and Decreased, no use of accessory muscle, Clear to Auscultation, no Crackles, no wheezes Abdomen: Bowel Sound present, Soft and no tenderness, no hernia Skin: no redness, no Rash, no induration Extremities:  Decreased erythema around the ankle Neurologic: Grossly no focal neuro deficit. Bilaterally Equal motor strength  Data Reviewed: CBC:  Recent Labs Lab 12/14/16 1232  12/16/16 0600 12/18/16 0804 12/19/16 0344 12/20/16 0336 12/21/16 0355  WBC 4.5  < > 6.0 4.5 6.9 3.7* 4.3  NEUTROABS 2.2  --   --   --   --   --   --   HGB 12.5*  < > 10.8* 8.8* 9.4* 8.4* 8.5*  HCT 38.2*  < > 33.9* 27.5* 28.7* 27.0* 27.4*  MCV 76.9*  < > 76.9* 77.9* 76.5* 78.3 78.7  PLT 271  < > 218 189 222 173 182  < > = values in this interval not displayed. Basic  Metabolic Panel:  Recent Labs Lab 12/15/16 0900  12/17/16 0542 12/18/16 0804 12/20/16 0336 12/20/16 0738  12/20/16 1532 12/20/16 1952 12/20/16 2352 12/21/16 0355 12/21/16 0705  NA  --   < > 139 138 133* 132*  --   --   --   --  137  --   K  --   < > 4.9 4.7 6.5* 5.8*  < > 4.6 4.3 4.8 5.0 5.0  CL  --   < > 112* 108 102 102  --   --   --   --  107  --   CO2  --   < > '23 27 27 24  '$ --   --   --   --  27  --   GLUCOSE  --   < > 131* 146* 496* 576*  --   --   --   --  335*  --   BUN  --   < > '7 7 15 14  '$ --   --   --   --  14  --   CREATININE  --   < > 1.18 1.28* 1.80* 1.72*  --   --   --   --  1.47*  --   CALCIUM  --   < > 8.1* 8.3* 7.9* 8.1*  --   --   --   --  7.9*  --   MG 1.8  --   --   --  1.9  --   --   --   --   --   --   --   < > = values in this interval not displayed.  Liver Function Tests:  Recent Labs Lab 12/14/16 1232 12/15/16 0900 12/16/16 0600 12/18/16 0804  AST 77* 50* 51* 47*  ALT '30 21 26 22  '$ ALKPHOS 112 87 98 79  BILITOT 0.5 0.5 0.4 0.3  PROT 8.2* 5.3* 5.7* 5.3*  ALBUMIN 3.3* 2.2* 2.2* 2.0*   No results for input(s): LIPASE, AMYLASE in the last 168 hours. No results for input(s): AMMONIA in the last 168 hours. Coagulation Profile: No results for input(s): INR, PROTIME in the last 168 hours. Cardiac Enzymes: No results for input(s): CKTOTAL, CKMB, CKMBINDEX, TROPONINI in the last 168 hours. BNP (last 3 results) No results for input(s): PROBNP in the last 8760 hours. CBG:  Recent Labs Lab 12/20/16 1430 12/20/16 1711 12/20/16 2131 12/21/16 0405 12/21/16 0644  GLUCAP 170* 270* 190* 303* 309*   Studies: No results found.  Scheduled Meds: . aspirin EC  81 mg Oral Daily  . atorvastatin  40 mg Oral Daily  . ferrous sulfate  325 mg Oral BID WC  . gabapentin  300 mg Oral BID  . gabapentin  600 mg Oral QHS  . insulin aspart  0-5 Units Subcutaneous QHS  . insulin aspart  0-9 Units Subcutaneous TID WC  . insulin glargine  10 Units Subcutaneous Once   . [START ON 12/22/2016] insulin glargine  25 Units Subcutaneous Daily  . lipase/protease/amylase  24,000 Units Oral TID AC  . mirtazapine  30 mg Oral QHS  . multivitamin with minerals  1 tablet Oral Daily  . nicotine  21 mg Transdermal Daily  . polyethylene glycol  17 g Oral Daily   Continuous Infusions: . sodium chloride    . ampicillin-sulbactam (UNASYN) IV Stopped (12/21/16 0558)   PRN Meds: hydrALAZINE, ondansetron, oxyCODONE-acetaminophen **AND** oxyCODONE, senna-docusate, traMADol, zolpidem  Time spent: 35 minutes  Author: Reyne Dumas, MD [facility] Pager:  [phone] 12/21/2016 10:30 AM  If 7PM-7AM, please contact night-coverage at [email or web address], password [facility]

## 2016-12-21 NOTE — Progress Notes (Signed)
Hypoglycemic Event  CBG: 70  Treatment: 15 GM carbohydrate snack  Symptoms: None  Follow-up CBG: Time: 1120 CBG Result: 91  Possible Reasons for Event: Unknown  Comments/MD notified: MD was notified    Forbes CellarSarah W Orlando Devereux

## 2016-12-21 NOTE — Progress Notes (Signed)
Inpatient Diabetes Program Recommendations  AACE/ADA: New Consensus Statement on Inpatient Glycemic Control (2015)  Target Ranges:  Prepandial:   less than 140 mg/dL      Peak postprandial:   less than 180 mg/dL (1-2 hours)      Critically ill patients:  140 - 180 mg/dL    Review of Glycemic Control  Diabetes history: DM 2 Outpatient Diabetes medications: Basaglar 35 units, Novolog 4-12 units tid  Current orders for Inpatient glycemic control: Lantus 25 units, Novolog Sensitive tid + Novolog HS scale  Inpatient Diabetes Program Recommendations:    Fasting 300's this am. Lantus 10 units increased to 25 units. Patient had hypoglycemia on this Lantus dose earlier in the admission. Consider decreasing Lantus dose to 15-18 units. Patient also has a history of very high glucose levels after meals. Patient may need low dose meal coverage when glucose does not meet criteria to receive correction insulin.  Thanks,  Christena DeemShannon Zasha Belleau RN, MSN, [facility] Inpatient Diabetes Coordinator Team Pager [phone] (8a-5p)

## 2016-12-21 NOTE — Progress Notes (Signed)
RN paged diabetes coordinator per doctor's orders. Waiting for a call back.

## 2016-12-22 DIAGNOSIS — M25571 Pain in right ankle and joints of right foot: Secondary | ICD-10-CM

## 2016-12-22 LAB — GLUCOSE, CAPILLARY
GLUCOSE-CAPILLARY: 137 mg/dL — AB (ref 65–99)
GLUCOSE-CAPILLARY: 213 mg/dL — AB (ref 65–99)
GLUCOSE-CAPILLARY: 291 mg/dL — AB (ref 65–99)
GLUCOSE-CAPILLARY: 334 mg/dL — AB (ref 65–99)
GLUCOSE-CAPILLARY: 367 mg/dL — AB (ref 65–99)
Glucose-Capillary: 296 mg/dL — ABNORMAL HIGH (ref 65–99)
Glucose-Capillary: 158 mg/dL — ABNORMAL HIGH (ref 65–99)
Glucose-Capillary: 289 mg/dL — ABNORMAL HIGH (ref 65–99)
Glucose-Capillary: 297 mg/dL — ABNORMAL HIGH (ref 65–99)
Glucose-Capillary: 371 mg/dL — ABNORMAL HIGH (ref 65–99)

## 2016-12-22 LAB — POTASSIUM
POTASSIUM: 4.8 mmol/L (ref 3.5–5.1)
Potassium: 4.8 mmol/L (ref 3.5–5.1)
Potassium: 4.9 mmol/L (ref 3.5–5.1)

## 2016-12-22 LAB — COMPREHENSIVE METABOLIC PANEL
ALBUMIN: 2 g/dL — AB (ref 3.5–5.0)
ALT: 22 U/L (ref 17–63)
AST: 40 U/L (ref 15–41)
Alkaline Phosphatase: 75 U/L (ref 38–126)
Anion gap: 4 — ABNORMAL LOW (ref 5–15)
BUN: 14 mg/dL (ref 6–20)
CHLORIDE: 104 mmol/L (ref 101–111)
CO2: 27 mmol/L (ref 22–32)
CREATININE: 1.31 mg/dL — AB (ref 0.61–1.24)
Calcium: 8.3 mg/dL — ABNORMAL LOW (ref 8.9–10.3)
GFR calc Af Amer: >60 mL/min (ref >60)
GFR, EST NON AFRICAN AMERICAN: 59 mL/min — AB (ref >60)
GLUCOSE: 328 mg/dL — AB (ref 65–99)
POTASSIUM: 5.2 mmol/L — AB (ref 3.5–5.1)
SODIUM: 135 mmol/L (ref 135–145)
Total Bilirubin: 0.3 mg/dL (ref 0.3–1.2)
Total Protein: 5.4 g/dL — ABNORMAL LOW (ref 6.5–8.1)

## 2016-12-22 LAB — CBC
HCT: 25.8 % — ABNORMAL LOW (ref 39.0–52.0)
Hemoglobin: 8.1 g/dL — ABNORMAL LOW (ref 13.0–17.0)
MCH: 24.7 pg — ABNORMAL LOW (ref 26.0–34.0)
MCHC: 31.4 g/dL (ref 30.0–36.0)
MCV: 78.7 fL (ref 78.0–100.0)
PLATELETS: 177 10*3/uL (ref 150–400)
RBC: 3.28 MIL/uL — AB (ref 4.22–5.81)
RDW: 14.4 % (ref 11.5–15.5)
WBC: 4.1 10*3/uL (ref 4.0–10.5)

## 2016-12-22 MED ORDER — DOXYCYCLINE HYCLATE 100 MG PO TABS
100.0000 mg | ORAL_TABLET | Freq: Two times a day (BID) | ORAL | Status: DC
  Administered 2016-12-22 – 2016-12-23 (×3): 100 mg via ORAL
  Filled 2016-12-22 (×4): qty 1

## 2016-12-22 NOTE — Progress Notes (Signed)
[facility] Progress Note  Patient: Raymond Winters ZOX:[id number]   PCP: Jaclyn Shaggy, MD DOB: 21-Sep-1960   DOA: 12/14/2016   DOS: 12/22/2016   Date of Service: the patient was seen and examined on 12/22/2016  Subjective:   Accu-Cheks more stable this afternoon, in the 300s overnight Continues to complain of foot pain, requesting pain medicine at discharge   Brief hospital course: Pt. with PMH of IV drug abuse, dyslipidemia, type 2 diabetes mellitus, depression, alcohol abuse, tobacco abuse, alcoholic liver disease, CKD stage II; admitted on 12/14/2016, presented with complaint of right ankle pain, was found to have osteomyelitis of the right ankle. Currently further plan is to achieve stable Accu-Cheks  Assessment and Plan: 1.Tenosynovitis/cellulitis of the right ankle status post irrigation and debridement on 6/29 Sepsis on admission with hypotension, tachypnea, lactic acidosis. No evidence of septic arthritis. Per operative note necrotic tissue did go down to bone that was scrapped with a curet. Deep wound culture positive for staph aureus, resistant to erythromycin but sensitive to everything else Status post IV vancomycin and Zosyn, then Unasyn. Will switch to doxycycline orally  MRSA PCR negative as well.     2. Pain control. History of substance abuse. Cocaine abuse. Patient has history of multiple substance abuse, including alcohol as well as heroin and tobacco. Currently no evidence of withdrawal and out of the withdrawal window period as well. We will monitor. Continue nicotine patch for smoking. Patient was counseled against smoking. 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 Patient was started on scheduled oxycodone by orthopedics,  Gabapentin added for neuropathic pain Toradol discontinued due to acute kidney injury, avoid NSAIDs Use tramadol as needed for pain  3. History of depression. Continuing home  regimen.  4. Type 2 diabetes mellitus. Fluctuating between low and high sugars,increase frequency of accuchecks to q 4 hrs Hemoglobin A1c 13.2 this month. Patient was actually hypoglycemic on 12/17/2016 requiring D5 infusion. Currently hyperglycemic with CBGs in the 300s. Continue Lantus at 25 units, patient very sensitive to SSI, therefore hold SSI / novolog and see what he does today    5. Anemia, likely nutritional deficiency./Anemia of chronic disease Currently 8.5> 8.1 Folic acid and B-12 normal  6 acute kidney injury Creatinine was 1.08, increased to 1.8 on 7/1, now 1.31 Suspect due to dehydration/hyperglycemia Avoid NSAIDs Discontinue IV fluids   Diet: regular diet DVT Prophylaxis: subcutaneous Heparin  Advance goals of care discussion: full code  Family Communication: no family was present at bedside, at the time of interview.   Disposition:  Likely 1-2 days  Consultants: orthopedics Procedures: I and D  Antibiotics: Anti-infectives    Start     Dose/Rate Route Frequency Ordered Stop   12/22/16 1200  doxycycline (VIBRA-TABS) tablet 100 mg     100 mg Oral Every 12 hours 12/22/16 1156     12/19/16 1100  Ampicillin-Sulbactam (UNASYN) 3 g in sodium chloride 0.9 % 100 mL IVPB  Status:  Discontinued     3 g 200 mL/hr over 30 Minutes Intravenous Every 8 hours 12/19/16 1011 12/22/16 1156   12/15/16 2000  vancomycin (VANCOCIN) IVPB 750 mg/150 ml premix  Status:  Discontinued     750 mg 150 mL/hr over 60 Minutes Intravenous Every 24 hours 12/14/16 1927 12/19/16 0959   12/15/16 0000  piperacillin-tazobactam (ZOSYN) IVPB 3.375 g  Status:  Discontinued     3.375 g 12.5 mL/hr over 240 Minutes Intravenous Every 8 hours 12/14/16  2328 12/19/16 0959   12/14/16 2000  piperacillin-tazobactam (ZOSYN) IVPB 3.375 g  Status:  Discontinued     3.375 g 12.5 mL/hr over 240 Minutes Intravenous Every 8 hours 12/14/16 1927 12/14/16 2328   12/14/16 1815  vancomycin (VANCOCIN) IVPB 1000  mg/200 mL premix     1,000 mg 200 mL/hr over 60 Minutes Intravenous  Once 12/14/16 1800 12/14/16 2003       Objective: Physical Exam: Vitals:   12/21/16 1300 12/21/16 1627 12/21/16 2000 12/22/16 0700  BP: 108/90 130/85 131/80 132/84  Pulse: 98 92 86   Resp: 16  16 16   Temp: 98.1 F (36.7 C) 98.6 F (37 C) 99.1 F (37.3 C) 98.4 F (36.9 C)  TempSrc: Oral Oral Oral Oral  SpO2: 100% 100% 100% 100%  Weight:      Height:        Intake/Output Summary (Last 24 hours) at 12/22/16 1157 Last data filed at 12/22/16 0809  Gross per 24 hour  Intake             1850 ml  Output              550 ml  Net             1300 ml   Filed Weights   12/14/16 1220  Weight: 49 kg (108 lb)   General: Alert, Awake and Oriented to Time, Place and Person. Appear in mild distress, affect appropriate Eyes: PERRL, Conjunctiva normal ENT: Oral Mucosa clear moist. Neck: no JVD, no Abnormal Mass Or lumps Cardiovascular: S1 and S2 Present, no Murmur, Peripheral Pulses Present Respiratory: normal respiratory effort, Bilateral Air entry equal and Decreased, no use of accessory muscle, Clear to Auscultation, no Crackles, no wheezes Abdomen: Bowel Sound present, Soft and no tenderness, no hernia Skin: no redness, no Rash, no induration Extremities:  Decreased erythema around the ankle Neurologic: Grossly no focal neuro deficit. Bilaterally Equal motor strength  Data Reviewed: CBC:  Recent Labs Lab 12/18/16 0804 12/19/16 0344 12/20/16 0336 12/21/16 0355 12/22/16 0359  WBC 4.5 6.9 3.7* 4.3 4.1  HGB 8.8* 9.4* 8.4* 8.5* 8.1*  HCT 27.5* 28.7* 27.0* 27.4* 25.8*  MCV 77.9* 76.5* 78.3 78.7 78.7  PLT 189 222 173 182 177   Basic Metabolic Panel:  Recent Labs Lab 12/18/16 0804 12/20/16 0336 12/20/16 0738  12/21/16 0355  12/21/16 1542 12/21/16 2003 12/22/16 0022 12/22/16 0359 12/22/16 0816  NA 138 133* 132*  --  137  --   --   --   --  135  --   K 4.7 6.5* 5.8*  < > 5.0  < > 4.9 4.8 4.8 5.2*  4.8  CL 108 102 102  --  107  --   --   --   --  104  --   CO2 27 27 24   --  27  --   --   --   --  27  --   GLUCOSE 146* 496* 576*  --  335*  --   --   --   --  328*  --   BUN 7 15 14   --  14  --   --   --   --  14  --   CREATININE 1.28* 1.80* 1.72*  --  1.47*  --   --   --   --  1.31*  --   CALCIUM 8.3* 7.9* 8.1*  --  7.9*  --   --   --   --  8.3*  --   MG  --  1.9  --   --   --   --   --   --   --   --   --   < > = values in this interval not displayed.  Liver Function Tests:  Recent Labs Lab 12/16/16 0600 12/18/16 0804 12/22/16 0359  AST 51* 47* 40  ALT 26 22 22   ALKPHOS 98 79 75  BILITOT 0.4 0.3 0.3  PROT 5.7* 5.3* 5.4*  ALBUMIN 2.2* 2.0* 2.0*   No results for input(s): LIPASE, AMYLASE in the last 168 hours. No results for input(s): AMMONIA in the last 168 hours. Coagulation Profile: No results for input(s): INR, PROTIME in the last 168 hours. Cardiac Enzymes: No results for input(s): CKTOTAL, CKMB, CKMBINDEX, TROPONINI in the last 168 hours. BNP (last 3 results) No results for input(s): PROBNP in the last 8760 hours. CBG:  Recent Labs Lab 12/21/16 2217 12/22/16 0033 12/22/16 0339 12/22/16 0607 12/22/16 0832  GLUCAP 296* 213* 291* 334* 137*   Studies: No results found.  Scheduled Meds: . aspirin EC  81 mg Oral Daily  . atorvastatin  40 mg Oral Daily  . doxycycline  100 mg Oral Q12H  . ferrous sulfate  325 mg Oral BID WC  . gabapentin  300 mg Oral BID  . gabapentin  600 mg Oral QHS  . insulin aspart  0-5 Units Subcutaneous QHS  . insulin aspart  0-9 Units Subcutaneous TID WC  . insulin glargine  10 Units Subcutaneous Once  . insulin glargine  25 Units Subcutaneous Daily  . lipase/protease/amylase  24,000 Units Oral TID AC  . mirtazapine  30 mg Oral QHS  . multivitamin with minerals  1 tablet Oral Daily  . nicotine  21 mg Transdermal Daily  . polyethylene glycol  17 g Oral Daily   Continuous Infusions:  PRN Meds: hydrALAZINE, ondansetron,  oxyCODONE-acetaminophen **AND** oxyCODONE, senna-docusate, traMADol, zolpidem  Time spent: 35 minutes  Author: Richarda Overlie, MD [facility] Pager:  [phone] 12/22/2016 11:57 AM  If 7PM-7AM, please contact night-coverage at [email or web address], password [email or web address]

## 2016-12-22 NOTE — Progress Notes (Signed)
Left voicemail with Dwaine DeterHenry Martensen-PA regarding pt's pain prescriptions at discharge. Received call-back by Dr. Eulah PontMurphy stating he did not recommend sending pt home on any narcotic medications given pt's history of drug abuse and since the abscess had been treated surgically. Updated pt on plan, and though he did not agree with it, he verbalized understanding. Will continue to monitor

## 2016-12-22 NOTE — Progress Notes (Signed)
Physical Therapy Treatment Patient Details Name: Raymond ButteryGary Winters MRN: [id number] DOB: January 18, 1961 Today's Date: 12/22/2016    History of Present Illness Pt is a 56 y/o M with cellulitis of R ankle. PMH of HLD, DM, depression, alcohol abuse, CKD, liver disease, Hep C.     PT Comments    Patient reported pain has decreased from yesterday and is happy to participate in therapy today. Pt continues to progress and able to increase gait distance and negotiate single step. Pt will continue to benefit from further skilled PT in acute setting. Current plan remains appropriate.    Follow Up Recommendations  No PT follow up     Equipment Recommendations  None recommended by PT    Recommendations for Other Services       Precautions / Restrictions Precautions Precautions: Fall Restrictions Weight Bearing Restrictions: Yes RLE Weight Bearing: Weight bearing as tolerated    Mobility  Bed Mobility               General bed mobility comments: pt up in room upon arrival  Transfers Overall transfer level: Modified independent Equipment used: Straight cane Transfers: Sit to/from Stand           General transfer comment: SPC used upon standing  Ambulation/Gait Ambulation/Gait assistance: Supervision Ambulation Distance (Feet): 200 Feet Assistive device: Straight cane Gait Pattern/deviations: Step-through pattern;Antalgic;Decreased dorsiflexion - right;Decreased weight shift to right;Drifts right/left Gait velocity: decreased   General Gait Details: cues for use of SPC and R heel strike   Stairs Stairs: Yes   Stair Management: No rails;Step to pattern;Forwards;With cane Number of Stairs: 1 General stair comments: practiced single step to simulate home entrance; cues for sequencing and technique  Wheelchair Mobility    Modified Rankin (Stroke Patients Only)       Balance Overall balance assessment: Needs assistance Sitting-balance support: Feet supported Sitting  balance-Leahy Scale: Good     Standing balance support: No upper extremity supported;During functional activity Standing balance-Leahy Scale: Fair                              Cognition Arousal/Alertness: Awake/alert Behavior During Therapy: Impulsive;WFL for tasks assessed/performed Overall Cognitive Status: Within Functional Limits for tasks assessed                                        Exercises      General Comments        Pertinent Vitals/Pain Pain Assessment: Faces Faces Pain Scale: Hurts little more Pain Location: R ankle  Pain Descriptors / Indicators: Grimacing;Guarding;Sore Pain Intervention(s): Monitored during session;Repositioned;Patient requesting pain meds-RN notified    Home Living                      Prior Function            PT Goals (current goals can now be found in the care plan section) Progress towards PT goals: Progressing toward goals    Frequency    Min 2X/week      PT Plan Current plan remains appropriate    Co-evaluation              AM-PAC PT "6 Clicks" Daily Activity  Outcome Measure  Difficulty turning over in bed (including adjusting bedclothes, sheets and blankets)?: None Difficulty moving from lying on back to sitting on the  side of the bed? : None Difficulty sitting down on and standing up from a chair with arms (e.g., wheelchair, bedside commode, etc,.)?: None Help needed moving to and from a bed to chair (including a wheelchair)?: None Help needed walking in hospital room?: A Little Help needed climbing 3-5 steps with a railing? : A Little 6 Click Score: 22    End of Session Equipment Utilized During Treatment: Gait belt Activity Tolerance: Patient tolerated treatment well Patient left: in chair;with call bell/phone within reach Nurse Communication: Mobility status PT Visit Diagnosis: Other abnormalities of gait and mobility (R26.89);Muscle weakness (generalized)  (M62.81)     Time: 1324-4010 PT Time Calculation (min) (ACUTE ONLY): 12 min  Charges:  $Gait Training: 8-22 mins                    G Codes:       Erline Levine, PTA Pager: [phone]     Carolynne Edouard 12/22/2016, 11:54 AM

## 2016-12-23 LAB — GLUCOSE, CAPILLARY
GLUCOSE-CAPILLARY: 246 mg/dL — AB (ref 65–99)
Glucose-Capillary: 266 mg/dL — ABNORMAL HIGH (ref 65–99)
Glucose-Capillary: 205 mg/dL — ABNORMAL HIGH (ref 65–99)
Glucose-Capillary: 145 mg/dL — ABNORMAL HIGH (ref 65–99)

## 2016-12-23 LAB — AEROBIC/ANAEROBIC CULTURE W GRAM STAIN (SURGICAL/DEEP WOUND)

## 2016-12-23 LAB — COMPREHENSIVE METABOLIC PANEL WITH GFR
ALT: 23 U/L (ref 17–63)
AST: 39 U/L (ref 15–41)
Albumin: 2.3 g/dL — ABNORMAL LOW (ref 3.5–5.0)
Alkaline Phosphatase: 79 U/L (ref 38–126)
Anion gap: 6 (ref 5–15)
BUN: 12 mg/dL (ref 6–20)
CO2: 26 mmol/L (ref 22–32)
Calcium: 8.7 mg/dL — ABNORMAL LOW (ref 8.9–10.3)
Chloride: 104 mmol/L (ref 101–111)
Creatinine, Ser: 1.21 mg/dL (ref 0.61–1.24)
GFR calc Af Amer: >60 mL/min
GFR calc non Af Amer: >60 mL/min
Glucose, Bld: 261 mg/dL — ABNORMAL HIGH (ref 65–99)
Potassium: 4.8 mmol/L (ref 3.5–5.1)
Sodium: 136 mmol/L (ref 135–145)
Total Bilirubin: 0.4 mg/dL (ref 0.3–1.2)
Total Protein: 6 g/dL — ABNORMAL LOW (ref 6.5–8.1)

## 2016-12-23 LAB — CBC
HEMATOCRIT: 30 % — AB (ref 39.0–52.0)
HEMOGLOBIN: 9.3 g/dL — AB (ref 13.0–17.0)
MCH: 24.4 pg — AB (ref 26.0–34.0)
MCHC: 31 g/dL (ref 30.0–36.0)
MCV: 78.7 fL (ref 78.0–100.0)
PLATELETS: 195 10*3/uL (ref 150–400)
RBC: 3.81 MIL/uL — AB (ref 4.22–5.81)
RDW: 14.4 % (ref 11.5–15.5)
WBC: 4.1 10*3/uL (ref 4.0–10.5)

## 2016-12-23 LAB — AEROBIC/ANAEROBIC CULTURE (SURGICAL/DEEP WOUND)

## 2016-12-23 MED ORDER — POLYETHYLENE GLYCOL 3350 17 G PO PACK: 17.0000 g | PACK | Freq: Every day | ORAL | 0 refills | Status: DC

## 2016-12-23 MED ORDER — DOXYCYCLINE HYCLATE 100 MG PO TABS: 100.0000 mg | ORAL_TABLET | Freq: Two times a day (BID) | ORAL | 0 refills | Status: AC

## 2016-12-23 MED ORDER — INSULIN GLARGINE 100 UNIT/ML ~~LOC~~ SOLN
28.0000 [IU] | Freq: Every day | SUBCUTANEOUS | Status: DC
  Administered 2016-12-23: 28 [IU] via SUBCUTANEOUS
  Filled 2016-12-23 (×2): qty 0.28

## 2016-12-23 MED ORDER — NICOTINE 21 MG/24HR TD PT24: 21.0000 mg | MEDICATED_PATCH | Freq: Every day | TRANSDERMAL | 0 refills | Status: DC

## 2016-12-23 MED ORDER — TRAMADOL HCL 50 MG PO TABS: 50.0000 mg | ORAL_TABLET | Freq: Four times a day (QID) | ORAL | 0 refills | Status: DC | PRN

## 2016-12-23 MED ORDER — INSULIN GLARGINE 100 UNIT/ML SOLOSTAR PEN: 30.0000 [IU] | PEN_INJECTOR | Freq: Every morning | SUBCUTANEOUS | 11 refills | Status: DC

## 2016-12-23 MED ORDER — FERROUS SULFATE 325 (65 FE) MG PO TABS: 325.0000 mg | ORAL_TABLET | Freq: Two times a day (BID) | ORAL | 3 refills | Status: AC

## 2016-12-23 NOTE — Discharge Summary (Signed)
Physician Discharge Summary  Raymond Winters MRN: [id number] DOB/AGE: 1961/06/09 56 y.o.  PCP: Arnoldo Morale, MD   Admit date: 12/14/2016 Discharge date: 12/23/2016  Discharge Diagnoses:    Principal Problem:   Cellulitis of right ankle Active Problems:   Heroin abuse   Hyperkalemia   Alcohol abuse   Hyperglycemia   Tobacco abuse   Type 2 diabetes mellitus with complication, with long-term current use of insulin (HCC)   Lactic acid increased, CHRONIC   Depression   Cellulitis of foot, right   Chronic kidney disease (CKD), stage II (mild)   Ankle cellulitis   Ankle pain, right    Follow-up recommendations Follow-up with PCP in 3-5 days , including all  additional recommended appointments as below Follow-up CBC, CMP in 3-5 days Patient to follow-up with orthopedics, Dr. Percell Miller in 1-2 weeks      Current Discharge Medication List    START taking these medications   Details  doxycycline (VIBRA-TABS) 100 MG tablet Take 1 tablet (100 mg total) by mouth every 12 (twelve) hours. Qty: 24 tablet, Refills: 0    ferrous sulfate 325 (65 FE) MG tablet Take 1 tablet (325 mg total) by mouth 2 (two) times daily with a meal. Qty: 60 tablet, Refills: 3    nicotine (NICODERM CQ - DOSED IN MG/24 HOURS) 21 mg/24hr patch Place 1 patch (21 mg total) onto the skin daily. Qty: 28 patch, Refills: 0    polyethylene glycol (MIRALAX / GLYCOLAX) packet Take 17 g by mouth daily. Qty: 14 each, Refills: 0      CONTINUE these medications which have CHANGED   Details  Insulin Glargine (LANTUS SOLOSTAR) 100 UNIT/ML Solostar Pen Inject 30 Units into the skin every morning. Qty: 15 mL, Refills: 11    traMADol (ULTRAM) 50 MG tablet Take 1 tablet (50 mg total) by mouth every 6 (six) hours as needed. Qty: 30 tablet, Refills: 0   Associated Diagnoses: Necrotizing fasciitis ([place])      CONTINUE these medications which have NOT CHANGED   Details  aspirin EC 81 MG tablet Take 1 tablet (81 mg total) by mouth  daily. Qty: 30 tablet, Refills: 5   Associated Diagnoses: HLD (hyperlipidemia)    atorvastatin (LIPITOR) 40 MG tablet Take 1 tablet (40 mg total) by mouth daily. Qty: 30 tablet, Refills: 5   Associated Diagnoses: Type 2 diabetes mellitus without complication, with long-term current use of insulin (HCC)    gabapentin (NEURONTIN) 300 MG capsule TAKE 2 CAPSULE BY MOUTH AT BEDTIME FOR DIABETIC NERVE PAIN Qty: 120 capsule, Refills: 5   Associated Diagnoses: Diabetic polyneuropathy associated with type 2 diabetes mellitus (HCC)    insulin aspart (NOVOLOG FLEXPEN) 100 UNIT/ML FlexPen 0-12 units before meals as per sliding scale as directed Qty: 15 mL, Refills: 2   Associated Diagnoses: Type 2 diabetes mellitus with complication, with long-term current use of insulin (HCC)    meloxicam (MOBIC) 7.5 MG tablet Take 1 tablet (7.5 mg total) by mouth daily as needed for pain. Qty: 30 tablet, Refills: 1   Associated Diagnoses: Septic arthritis of right ankle, due to unspecified organism (HCC)    mirtazapine (REMERON) 30 MG tablet Take 1 tablet (30 mg total) by mouth at bedtime. Qty: 30 tablet, Refills: 3    Multiple Vitamins-Minerals (MULTIVITAMIN WITH MINERALS) tablet Take 1 tablet by mouth daily.    Pancrelipase, Lip-Prot-Amyl, (ZENPEP) 25000 units CPEP Take 25,000 Units by mouth 3 (three) times daily before meals. Qty: 90 capsule, Refills: 3  Blood Glucose Monitoring Suppl (ACCU-CHEK AVIVA) device Use as instructed 3 times before meals Qty: 1 each, Refills: 0   Associated Diagnoses: Type 2 diabetes mellitus with complication, with long-term current use of insulin (HCC)    glucose blood (ACCU-CHEK AVIVA) test strip Use as instructed 3 times before meals Qty: 100 each, Refills: 12   Associated Diagnoses: Type 2 diabetes mellitus with complication, with long-term current use of insulin (HCC)    Insulin Pen Needle 31G X 5 MM MISC Use 3 times daily before meals and at bedtime Qty: 120 each,  Refills: 5    Lancet Devices (ACCU-CHEK SOFTCLIX) lancets Use as instructed 3 times before meals Qty: 1 each, Refills: 5   Associated Diagnoses: Type 2 diabetes mellitus with complication, with long-term current use of insulin (HCC)    oxyCODONE-acetaminophen (PERCOCET) 5-325 MG tablet Take 1 tablet by mouth every 4 (four) hours as needed. Qty: 10 tablet, Refills: 0         Discharge Condition: Stable   Discharge Instructions Get Medicines reviewed and adjusted: Please take all your medications with you for your next visit with your Primary MD  Please request your Primary MD to go over all hospital tests and procedure/radiological results at the follow up, please ask your Primary MD to get all Hospital records sent to his/her office.  If you experience worsening of your admission symptoms, develop shortness of breath, life threatening emergency, suicidal or homicidal thoughts you must seek medical attention immediately by calling 911 or calling your MD immediately if symptoms less severe.  You must read complete instructions/literature along with all the possible adverse reactions/side effects for all the Medicines you take and that have been prescribed to you. Take any new Medicines after you have completely understood and accpet all the possible adverse reactions/side effects.   Do not drive when taking Pain medications.   Do not take more than prescribed Pain, Sleep and Anxiety Medications  Special Instructions: If you have smoked or chewed Tobacco in the last 2 yrs please stop smoking, stop any regular Alcohol and or any Recreational drug use.  Wear Seat belts while driving.  Please note  You were cared for by a hospitalist during your hospital stay. Once you are discharged, your primary care physician will handle any further medical issues. Please note that NO REFILLS for any discharge medications will be authorized once you are discharged, as it is imperative that you  return to your primary care physician (or establish a relationship with a primary care physician if you do not have one) for your aftercare needs so that they can reassess your need for medications and monitor your lab values.     Allergies  Allergen Reactions  . Ativan [Lorazepam] Other (See Comments)    HALLUCINATIONS      Disposition: 01-Home or Self Care   Consults: Orthopedics    Significant Diagnostic Studies:  Dg Ankle Complete Right  Result Date: 12/14/2016 CLINICAL DATA:  Right ankle pain and swelling with probable infection. EXAM: RIGHT ANKLE - COMPLETE 3+ VIEW COMPARISON:  12/08/2016 FINDINGS: There is no evidence of acute fracture, subluxation or dislocation. No radiographic evidence of osteomyelitis noted. Mild soft tissue swelling noted. IMPRESSION: Mild soft tissue swelling without acute bony abnormality. Electronically Signed   By: Margarette Canada M.D.   On: 12/14/2016 18:37   Dg Ankle Complete Right  Result Date: 12/08/2016 CLINICAL DATA:  Right ankle pain and swelling, initial encounter EXAM: RIGHT ANKLE - COMPLETE 3+ VIEW COMPARISON:  None. FINDINGS: There is no evidence of fracture, dislocation, or joint effusion. There is no evidence of arthropathy or other focal bone abnormality. Soft tissues are unremarkable. IMPRESSION: No acute abnormality noted Electronically Signed   By: Inez Catalina M.D.   On: 12/08/2016 16:24   Mr Ankle Right Wo Contrast  Result Date: 12/16/2016 CLINICAL DATA:  Right ankle pain and swelling. The patient reports he attempted to inject heroin at the right ankle 10 days ago. EXAM: MRI OF THE RIGHT ANKLE WITHOUT CONTRAST TECHNIQUE: Multiplanar, multisequence MR imaging of the ankle was performed. No intravenous contrast was administered. COMPARISON:  12/14/2016 FINDINGS: Soft tissues: There is a 2 x 2 x 1 cm fluid collection at the medial aspect of the distal tibia just superficial to the posterior tibialis tendon which could represent abscess or  the site of foreign substance injection this is best seen on image 11 of series 6 and image 21 of series 3. There is subcutaneous edema circumferentially in the lower leg extending onto the dorsum of the foot. TENDONS Peroneal: Normal. Posteromedial: Tenosynovitis involving the intact tibialis posterior, flexor hallucis longus and flexor digitorum longus tendons. Given the immediate proximity of the tenosynovitis to the abnormal fluid collection in the soft tissues, this could represent chemical tenosynovitis or infectious tenosynovitis. Anterior: Normal intact tibialis anterior, extensor hallucis longus and extensor digitorum longus tendons. Achilles: Normal. Plantar Fascia: Normal. LIGAMENTS Lateral: Intact. Medial: Intact. CARTILAGE Ankle Joint: No joint effusion or chondral defect. Subtalar Joints/Sinus Tarsi: No joint effusion or chondral defect. Bones: No marrow signal abnormality. No fracture or dislocation. IMPRESSION: 1. Tenosynovitis involving the intact tibialis posterior, flexor hallucis longus and flexor digitorum longus tendons at the posteromedial aspect of the ankle 2. Overlying soft tissue fluid collection which may represent the site of heroin injection. The findings could represent infection or chemical irritation of the tendons. Electronically Signed   By: Lorriane Shire M.D.   On: 12/16/2016 15:23       Filed Weights   12/14/16 1220  Weight: 49 kg (108 lb)     Microbiology: Recent Results (from the past 240 hour(s))  Culture, blood (x 2)     Status: None   Collection Time: 12/14/16  7:50 PM  Result Value Ref Range Status   Specimen Description BLOOD RIGHT HAND  Final   Special Requests IN PEDIATRIC BOTTLE Blood Culture adequate volume  Final   Culture NO GROWTH 5 DAYS  Final   Report Status 12/19/2016 FINAL  Final  Culture, blood (x 2)     Status: None   Collection Time: 12/14/16  7:54 PM  Result Value Ref Range Status   Specimen Description BLOOD RIGHT HAND  Final    Special Requests   Final    BOTTLES DRAWN AEROBIC AND ANAEROBIC Blood Culture adequate volume   Culture NO GROWTH 5 DAYS  Final   Report Status 12/19/2016 FINAL  Final  MRSA PCR Screening     Status: None   Collection Time: 12/17/16  6:41 PM  Result Value Ref Range Status   MRSA by PCR NEGATIVE NEGATIVE Final    Comment:        The GeneXpert MRSA Assay (FDA approved for NASAL specimens only), is one component of a comprehensive MRSA colonization surveillance program. It is not intended to diagnose MRSA infection nor to guide or monitor treatment for MRSA infections.   Aerobic/Anaerobic Culture (surgical/deep wound)     Status: None (Preliminary result)   Collection Time: 12/18/16  3:10 PM  Result Value Ref Range Status   Specimen Description ABSCESS  Final   Special Requests RIGHT ANKLE  Final   Gram Stain   Final    RARE WBC PRESENT,BOTH PMN AND MONONUCLEAR NO ORGANISMS SEEN    Culture   Final    RARE STAPHYLOCOCCUS AUREUS NO ANAEROBES ISOLATED; CULTURE IN PROGRESS FOR 5 DAYS    Report Status PENDING  Incomplete   Organism ID, Bacteria STAPHYLOCOCCUS AUREUS  Final      Susceptibility   Staphylococcus aureus - MIC*    CIPROFLOXACIN <=0.5 SENSITIVE Sensitive     ERYTHROMYCIN >=8 RESISTANT Resistant     GENTAMICIN <=0.5 SENSITIVE Sensitive     OXACILLIN 0.5 SENSITIVE Sensitive     TETRACYCLINE <=1 SENSITIVE Sensitive     VANCOMYCIN <=0.5 SENSITIVE Sensitive     TRIMETH/SULFA <=10 SENSITIVE Sensitive     CLINDAMYCIN <=0.25 SENSITIVE Sensitive     RIFAMPIN <=0.5 SENSITIVE Sensitive     Inducible Clindamycin NEGATIVE Sensitive     * RARE STAPHYLOCOCCUS AUREUS       Blood Culture    Component Value Date/Time   SDES ABSCESS 12/18/2016 1510   SPECREQUEST RIGHT ANKLE 12/18/2016 1510   CULT  12/18/2016 1510    RARE STAPHYLOCOCCUS AUREUS NO ANAEROBES ISOLATED; CULTURE IN PROGRESS FOR 5 DAYS    REPTSTATUS PENDING 12/18/2016 1510      Labs: Results for orders  placed or performed during the hospital encounter of 12/14/16 (from the past 48 hour(s))  Potassium     Status: None   Collection Time: 12/21/16 10:50 AM  Result Value Ref Range   Potassium 4.4 3.5 - 5.1 mmol/L  Glucose, capillary     Status: None   Collection Time: 12/21/16 10:58 AM  Result Value Ref Range   Glucose-Capillary 70 65 - 99 mg/dL  Glucose, capillary     Status: None   Collection Time: 12/21/16 11:24 AM  Result Value Ref Range   Glucose-Capillary 91 65 - 99 mg/dL  Glucose, capillary     Status: Abnormal   Collection Time: 12/21/16  1:06 PM  Result Value Ref Range   Glucose-Capillary 178 (H) 65 - 99 mg/dL  Potassium     Status: None   Collection Time: 12/21/16  3:42 PM  Result Value Ref Range   Potassium 4.9 3.5 - 5.1 mmol/L  Glucose, capillary     Status: Abnormal   Collection Time: 12/21/16  4:23 PM  Result Value Ref Range   Glucose-Capillary 219 (H) 65 - 99 mg/dL  Glucose, capillary     Status: Abnormal   Collection Time: 12/21/16  7:52 PM  Result Value Ref Range   Glucose-Capillary 200 (H) 65 - 99 mg/dL  Potassium     Status: None   Collection Time: 12/21/16  8:03 PM  Result Value Ref Range   Potassium 4.8 3.5 - 5.1 mmol/L  Glucose, capillary     Status: Abnormal   Collection Time: 12/21/16 10:17 PM  Result Value Ref Range   Glucose-Capillary 296 (H) 65 - 99 mg/dL  Potassium     Status: None   Collection Time: 12/22/16 12:22 AM  Result Value Ref Range   Potassium 4.8 3.5 - 5.1 mmol/L  Glucose, capillary     Status: Abnormal   Collection Time: 12/22/16 12:33 AM  Result Value Ref Range   Glucose-Capillary 213 (H) 65 - 99 mg/dL  Glucose, capillary     Status: Abnormal   Collection Time: 12/22/16  3:39 AM  Result  Value Ref Range   Glucose-Capillary 291 (H) 65 - 99 mg/dL  CBC     Status: Abnormal   Collection Time: 12/22/16  3:59 AM  Result Value Ref Range   WBC 4.1 4.0 - 10.5 K/uL   RBC 3.28 (L) 4.22 - 5.81 MIL/uL   Hemoglobin 8.1 (L) 13.0 - 17.0 g/dL    HCT 25.8 (L) 39.0 - 52.0 %   MCV 78.7 78.0 - 100.0 fL   MCH 24.7 (L) 26.0 - 34.0 pg   MCHC 31.4 30.0 - 36.0 g/dL   RDW 14.4 11.5 - 15.5 %   Platelets 177 150 - 400 K/uL  Comprehensive metabolic panel     Status: Abnormal   Collection Time: 12/22/16  3:59 AM  Result Value Ref Range   Sodium 135 135 - 145 mmol/L   Potassium 5.2 (H) 3.5 - 5.1 mmol/L   Chloride 104 101 - 111 mmol/L   CO2 27 22 - 32 mmol/L   Glucose, Bld 328 (H) 65 - 99 mg/dL   BUN 14 6 - 20 mg/dL   Creatinine, Ser 1.31 (H) 0.61 - 1.24 mg/dL   Calcium 8.3 (L) 8.9 - 10.3 mg/dL   Total Protein 5.4 (L) 6.5 - 8.1 g/dL   Albumin 2.0 (L) 3.5 - 5.0 g/dL   AST 40 15 - 41 U/L   ALT 22 17 - 63 U/L   Alkaline Phosphatase 75 38 - 126 U/L   Total Bilirubin 0.3 0.3 - 1.2 mg/dL   GFR calc non Af Amer 59 (L) >60 mL/min   GFR calc Af Amer >60 >60 mL/min    Comment: (NOTE) The eGFR has been calculated using the CKD EPI equation. This calculation has not been validated in all clinical situations. eGFR's persistently <60 mL/min signify possible Chronic Kidney Disease.    Anion gap 4 (L) 5 - 15  Glucose, capillary     Status: Abnormal   Collection Time: 12/22/16  6:07 AM  Result Value Ref Range   Glucose-Capillary 334 (H) 65 - 99 mg/dL  Potassium     Status: None   Collection Time: 12/22/16  8:16 AM  Result Value Ref Range   Potassium 4.8 3.5 - 5.1 mmol/L  Glucose, capillary     Status: Abnormal   Collection Time: 12/22/16  8:32 AM  Result Value Ref Range   Glucose-Capillary 137 (H) 65 - 99 mg/dL   Comment 1 Notify RN    Comment 2 Document in Chart   Potassium     Status: None   Collection Time: 12/22/16 11:43 AM  Result Value Ref Range   Potassium 4.9 3.5 - 5.1 mmol/L  Glucose, capillary     Status: Abnormal   Collection Time: 12/22/16 12:28 PM  Result Value Ref Range   Glucose-Capillary 158 (H) 65 - 99 mg/dL  Glucose, capillary     Status: Abnormal   Collection Time: 12/22/16  5:02 PM  Result Value Ref Range    Glucose-Capillary 289 (H) 65 - 99 mg/dL   Comment 1 Notify RN    Comment 2 Document in Chart   Glucose, capillary     Status: Abnormal   Collection Time: 12/22/16  5:20 PM  Result Value Ref Range   Glucose-Capillary 297 (H) 65 - 99 mg/dL  Glucose, capillary     Status: Abnormal   Collection Time: 12/22/16  8:13 PM  Result Value Ref Range   Glucose-Capillary 367 (H) 65 - 99 mg/dL  Glucose, capillary  Status: Abnormal   Collection Time: 12/22/16 11:19 PM  Result Value Ref Range   Glucose-Capillary 371 (H) 65 - 99 mg/dL  Glucose, capillary     Status: Abnormal   Collection Time: 12/23/16  5:17 AM  Result Value Ref Range   Glucose-Capillary 266 (H) 65 - 99 mg/dL  CBC     Status: Abnormal   Collection Time: 12/23/16  5:46 AM  Result Value Ref Range   WBC 4.1 4.0 - 10.5 K/uL   RBC 3.81 (L) 4.22 - 5.81 MIL/uL   Hemoglobin 9.3 (L) 13.0 - 17.0 g/dL   HCT 30.0 (L) 39.0 - 52.0 %   MCV 78.7 78.0 - 100.0 fL   MCH 24.4 (L) 26.0 - 34.0 pg   MCHC 31.0 30.0 - 36.0 g/dL   RDW 14.4 11.5 - 15.5 %   Platelets 195 150 - 400 K/uL  Comprehensive metabolic panel     Status: Abnormal   Collection Time: 12/23/16  5:46 AM  Result Value Ref Range   Sodium 136 135 - 145 mmol/L   Potassium 4.8 3.5 - 5.1 mmol/L   Chloride 104 101 - 111 mmol/L   CO2 26 22 - 32 mmol/L   Glucose, Bld 261 (H) 65 - 99 mg/dL   BUN 12 6 - 20 mg/dL   Creatinine, Ser 1.21 0.61 - 1.24 mg/dL   Calcium 8.7 (L) 8.9 - 10.3 mg/dL   Total Protein 6.0 (L) 6.5 - 8.1 g/dL   Albumin 2.3 (L) 3.5 - 5.0 g/dL   AST 39 15 - 41 U/L   ALT 23 17 - 63 U/L   Alkaline Phosphatase 79 38 - 126 U/L   Total Bilirubin 0.4 0.3 - 1.2 mg/dL   GFR calc non Af Amer >60 >60 mL/min   GFR calc Af Amer >60 >60 mL/min    Comment: (NOTE) The eGFR has been calculated using the CKD EPI equation. This calculation has not been validated in all clinical situations. eGFR's persistently <60 mL/min signify possible Chronic Kidney Disease.    Anion gap 6 5 - 15      Lipid Panel     Component Value Date/Time   CHOL 81 06/29/2016 0925   TRIG 101 06/29/2016 0925   HDL 38 (L) 06/29/2016 0925   CHOLHDL 2.1 06/29/2016 0925   VLDL 30 01/03/2014 0948   LDLCALC 16 01/03/2014 0948     Lab Results  Component Value Date   HGBA1C 13.2 12/08/2016   HGBA1C 10.9 (H) 08/22/2016   HGBA1C 7.8 (H) 06/23/2016       HPI :  56 y.o.malewith medical history significant ofIVDU, hyperlipidemia, diabetes mellitus, depression, hearing abuse, tobacco abuse, alcohol abuse, alcoholic liver disease, chronic elevation of lactic acid, CKD-II, who presents with right ankle and foot pain. Patient admitted for cellulitis of the right ankle, found to have an abscess. Status post Incision and drainage   6/29 by Dr. Wonda Olds COURSE:   1.Tenosynovitis/cellulitis of the right ankle status post irrigation and debridement on 6/29 Sepsis on admission with hypotension, tachypnea, lactic acidosis. No evidence of septic arthritis. Per operative note necrotic tissue did go down to bone that was scrapped with a curet. Deep wound culture positive for staph aureus, resistant to erythromycin but sensitive to everything else Status post IV vancomycin and Zosyn, then Unasyn.  switched to doxycycline orally MRSA PCR negative as well.     2. Pain control. History of substance abuse. Cocaine abuse. Patient has history of multiple substance abuse,  including alcohol as well as heroin and tobacco. Currently no evidence of withdrawal and out of the withdrawal window period as well. We will monitor. Continue nicotine patch for smoking. Patient was counseled against smoking. 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 Patient was started on scheduled oxycodone by orthopedics,  Toradol discontinued due to acute kidney injury, avoid NSAIDs Use tramadol as needed for pain  3. History of depression. Continuing home  regimen.  4. Type 2 diabetes mellitus. Fluctuating between low and high sugars,  Hemoglobin A1c 13.2 this month. Patient was actually hypoglycemic on 12/17/2016 requiring D5 infusion. Currently hyperglycemic with CBGs in the 200-300s. Continue Lantus at 30  Units q am , patient very sensitive to  short-acting insulin,  5. Anemia, likely nutritional deficiency./Anemia of chronic disease Currently 3.9> 8.1 Folic acid and E-32 normal  6 acute kidney injury Creatinine was 1.08, increased to 1.8 on 7/1, now 1.21, resolved Suspect due to dehydration/hyperglycemia Avoid NSAIDs       Discharge Exam:   Blood pressure 126/78, pulse 92, temperature 98.2 F (36.8 C), temperature source Oral, resp. rate 16, height '5\' 7"'$  (1.702 m), weight 49 kg (108 lb), SpO2 100 %. Cardiovascular: S1 and S2 Present, no Murmur, Peripheral Pulses Present Respiratory: normal respiratory effort, Bilateral Air entry equal and Decreased, no use of accessory muscle, Clear to Auscultation, no Crackles, no wheezes Abdomen: Bowel Sound present, Soft and no tenderness, no hernia Skin: no redness, no Rash, no induration Extremities:  Decreased erythema around the ankle Neurologic: Grossly no focal neuro deficit. Bilaterally Equal motor strength     Follow-up Information    Renette Butters, MD Follow up in 1 week(s).   Specialty:  Orthopedic Surgery Contact information: [address] [place] [address] [phone]        Arnoldo Morale, MD. Call.   Specialty:  Family Medicine Why:  Hospital follow-up in 3-5 days Contact information: [place] [address] [phone]           Signed: Reyne Dumas 12/23/2016, 7:39 AM        Time spent >1 hour

## 2016-12-23 NOTE — Progress Notes (Signed)
Pt's sister arrived, answered her questions as pt directed and okayed for nurse to answer, she helps him at home. Pt ready to go, pt declined to go out in wheelchair, pt walking well with his cane, leaving sister behind as he walked down the hall, refusing wheelchair. Pt d/c'd with his belongings.

## 2016-12-23 NOTE — Progress Notes (Signed)
D/c instructions reviewed with pt. Copy of instructions and 1 printed script given to pt (other scripts sent to pt's pharmacy). Pt waiting for his sister to come pick him up, pt instructed to let staff know when she arrives to pick him up.

## 2016-12-23 NOTE — Care Management Note (Signed)
Case Management Note  Patient Details  Name: Raymond ButteryGary Winters MRN: [id number] Date of Birth: 01/25/1961  Subjective/Objective:     56 yr old gentleman admitted with right ankle cellulitis. S/p I &D.               Action/Plan: [facility], CM has contacted Tereso NewcomerKaren Nusbauum, [facility] Liaison to inform her of resumtion order. Patient has all followup appointments on AVS.    Expected Discharge Date:  12/23/16               Expected Discharge Plan:  Home w Home Health Services  In-House Referral:  NA  Discharge planning Services  CM Consult  Post Acute Care Choice:  Home Health, Resumption of Svcs/PTA Provider Choice offered to:  Patient  DME Arranged:  N/A DME Agency:  NA  HH Arranged:  RN HH Agency:  [facility]  Status of Service:  Completed, signed off  If discussed at Long Length of Stay Meetings, dates discussed:    Additional Comments:  Durenda GuthrieBrady, Aslin Farinas Naomi, RN 12/23/2016, 1:46 PM

## 2016-12-23 NOTE — Care Management Important Message (Signed)
Important Message  Patient Details  Name: Raymond Winters MRN: [id number] Date of Birth: February 08, 1961   Medicare Important Message Given:  Yes    Tiyanna Larcom Abena 12/23/2016, 9:40 AM

## 2016-12-24 MED FILL — FERROUS SULFATE 325 MG TAB: 325 (65 FE) | 30 days supply | Qty: 60 | Fill #0

## 2016-12-24 MED FILL — DOXYCYCLINE 100 MG TABLET: 100 | 12 days supply | Qty: 24 | Fill #0

## 2016-12-24 MED FILL — POLYETHYLENE GLYCOL 3350: 15 days supply | Qty: 255 | Fill #0

## 2016-12-30 DIAGNOSIS — L02419 Cutaneous abscess of limb, unspecified: Secondary | ICD-10-CM | POA: Diagnosis not present

## 2016-12-31 ENCOUNTER — Encounter: Payer: Self-pay | Admitting: Physician Assistant

## 2016-12-31 ENCOUNTER — Ambulatory Visit: Payer: Medicare Other | Attending: Family Medicine | Admitting: Physician Assistant

## 2016-12-31 VITALS — BP 115/81 | HR 88 | Temp 97.7°F | Resp 18 | Ht 67.0 in | Wt 102.8 lb

## 2016-12-31 DIAGNOSIS — Z7984 Long term (current) use of oral hypoglycemic drugs: Secondary | ICD-10-CM | POA: Insufficient documentation

## 2016-12-31 DIAGNOSIS — F1410 Cocaine abuse, uncomplicated: Secondary | ICD-10-CM | POA: Diagnosis not present

## 2016-12-31 DIAGNOSIS — Z79899 Other long term (current) drug therapy: Secondary | ICD-10-CM | POA: Insufficient documentation

## 2016-12-31 DIAGNOSIS — L03115 Cellulitis of right lower limb: Secondary | ICD-10-CM | POA: Insufficient documentation

## 2016-12-31 DIAGNOSIS — E118 Type 2 diabetes mellitus with unspecified complications: Secondary | ICD-10-CM | POA: Diagnosis not present

## 2016-12-31 DIAGNOSIS — D649 Anemia, unspecified: Secondary | ICD-10-CM | POA: Insufficient documentation

## 2016-12-31 DIAGNOSIS — N179 Acute kidney failure, unspecified: Secondary | ICD-10-CM | POA: Diagnosis not present

## 2016-12-31 DIAGNOSIS — Z888 Allergy status to other drugs, medicaments and biological substances status: Secondary | ICD-10-CM | POA: Diagnosis not present

## 2016-12-31 DIAGNOSIS — Z7982 Long term (current) use of aspirin: Secondary | ICD-10-CM | POA: Diagnosis not present

## 2016-12-31 DIAGNOSIS — B1920 Unspecified viral hepatitis C without hepatic coma: Secondary | ICD-10-CM | POA: Insufficient documentation

## 2016-12-31 DIAGNOSIS — E1165 Type 2 diabetes mellitus with hyperglycemia: Secondary | ICD-10-CM | POA: Insufficient documentation

## 2016-12-31 DIAGNOSIS — F1020 Alcohol dependence, uncomplicated: Secondary | ICD-10-CM | POA: Diagnosis not present

## 2016-12-31 DIAGNOSIS — F329 Major depressive disorder, single episode, unspecified: Secondary | ICD-10-CM | POA: Diagnosis not present

## 2016-12-31 DIAGNOSIS — Z9114 Patient's other noncompliance with medication regimen: Secondary | ICD-10-CM | POA: Diagnosis not present

## 2016-12-31 DIAGNOSIS — L03119 Cellulitis of unspecified part of limb: Secondary | ICD-10-CM | POA: Diagnosis not present

## 2016-12-31 DIAGNOSIS — E7800 Pure hypercholesterolemia, unspecified: Secondary | ICD-10-CM | POA: Insufficient documentation

## 2016-12-31 DIAGNOSIS — F17200 Nicotine dependence, unspecified, uncomplicated: Secondary | ICD-10-CM | POA: Insufficient documentation

## 2016-12-31 LAB — GLUCOSE, POCT (MANUAL RESULT ENTRY)
POC Glucose: 57 mg/dl — AB (ref 70–99)
POC Glucose: 89 mg/dl (ref 70–99)

## 2016-12-31 NOTE — Progress Notes (Signed)
Patient ID: Raymond ButteryGary Gravlin, male   DOB: 01/22/1961, 56 y.o.   MRN: [id number]    Raymond ButteryGary Creer, is a 56 y.o. male  GEX:[id number]  [id number]  DOB - 01/22/1961  Subjective:  Chief Complaint and HPI: Raymond Winters is a 56 y.o. male here today for a follow up visit after recent hospitalization 12/14/2016-12/23/2016 for cellulitis of the R ankle.  He was discharged on Doxycycline.  IVDU was precipitating factor of abscess.   I&D with debridement was performed 12/18/2016.  Saw orthopedics yesterday and has another f/up 01/13/2017. He denies fever or erythema. He is currently working on getting into [facility] for substance abuse treatment.  Does not check blood sugars regularly.  He feels good this morning despite his blood sugar being 57.  We gave him some crackers and his blood sugar up to 89.    Social:  No job; uses IV heroin, multiple financial stressors.   Hospital course summary: 1. Sepsis on admission with hypotension, tachypnea, lactic acidosis. No evidence of septic arthritis. Per operative note necrotic tissue did go down to bone that was scrapped with a curet.Deep wound culture positive for staph aureus, resistant to erythromycin but sensitive to everything else Status postIV vancomycin and Zosyn, thenUnasyn.  switched to doxycycline orally MRSA PCR negative as well.   2. Pain control. History of substance abuse. Cocaine abuse. Patient has history of multiple substance abuse, including alcohol as well as heroin and tobacco. Currently no evidence of withdrawal and out of the withdrawal window period as well. We will monitor. Continue nicotine patch for smoking. Patient was counseled against smoking. 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 Patient was started on scheduled oxycodone by orthopedics,  Toradol discontinued due to acute kidney injury, avoid NSAIDs Use tramadol as needed for pain  3. History of depression. Continuing home  regimen.  4. Type 2 diabetes mellitus. Fluctuating between low and high sugars,  Hemoglobin A1c 13.2 this month. Patient was actually hypoglycemic on 12/17/2016 requiring D5 infusion. Currently hyperglycemic with CBGs in the 200-300s. Continue Lantus at 30  Units q am , patient very sensitive to  short-acting insulin,  5. Anemia, likely nutritional deficiency./Anemia of chronic disease Currently 8.5> 8.1 Folic acid and B-12 normal  6 acute kidney injury Creatinine was 1.08, increased to 1.8 on 7/1, now 1.21, resolved Suspect due to dehydration/hyperglycemia Avoid NSAIDsa.   ED/Hospital notes reviewed.   Social History: Family history:  ROS:   Constitutional:  No f/c, No night sweats, No unexplained weight loss. EENT:  No vision changes, No blurry vision, No hearing changes. No mouth, throat, or ear problems.  Respiratory: No cough, No SOB Cardiac: No CP, no palpitations GI:  No abd pain, No N/V/D. GU: No Urinary s/sx Musculoskeletal: No joint pain Neuro: No headache, no dizziness, no motor weakness.  Skin: No rash Endocrine:  No polydipsia. No polyuria.  Psych: Denies SI/HI  No problems updated.  ALLERGIES: Allergies  Allergen Reactions  . Ativan [Lorazepam] Other (See Comments)    HALLUCINATIONS    PAST MEDICAL HISTORY: Past Medical History:  Diagnosis Date  . Alcohol dependence (HCC) 01/05/2016  . Alcoholic liver disease, unspecified (HCC) 01/05/2016  . Arthritis    "lower extremeties, hands, shoulders, back" (08/20/2016)  . Chronic alcoholic pancreatitis (HCC) 01/05/2016  . Depression   . Family history of adverse reaction to anesthesia    pt's sister has hx. of post-op N/V  . Hepatitis C    "  never treated" (08/20/2016)  . Heroin abuse   . High cholesterol   . Hypoalbuminemia due to protein-calorie malnutrition (HCC) 01/05/2016  . Insulin dependent diabetes mellitus (HCC)   . Lactic acid increased, CHRONIC 01/05/2016   Secondary to Liver Disease,  Alcoholism, and Diabetes  . 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 Shoulder wound 01/2016   left    MEDICATIONS AT HOME: Prior to Admission medications   Medication Sig Start Date End Date Taking? Authorizing Provider  aspirin EC 81 MG tablet Take 1 tablet (81 mg total) by mouth daily. 04/04/14  Yes Ambrose Finland, NP  atorvastatin (LIPITOR) 40 MG tablet Take 1 tablet (40 mg total) by mouth daily. 06/25/16  Yes Jaclyn Shaggy, MD  Blood Glucose Monitoring Suppl (ACCU-CHEK AVIVA) device Use as instructed 3 times before meals 09/10/16  Yes Amao, Odette Horns, MD  doxycycline (VIBRA-TABS) 100 MG tablet Take 1 tablet (100 mg total) by mouth every 12 (twelve) hours. 12/23/16 01/04/17 Yes Richarda Overlie, MD  ferrous sulfate 325 (65 FE) MG tablet Take 1 tablet (325 mg total) by mouth 2 (two) times daily with a meal. 12/23/16  Yes Richarda Overlie, MD  gabapentin (NEURONTIN) 300 MG capsule TAKE 2 CAPSULE BY MOUTH AT BEDTIME FOR DIABETIC NERVE PAIN Patient taking differently: Take 600 mg by mouth at bedtime. FOR DIABETIC NERVE PAIN 12/11/16  Yes Jaclyn Shaggy, MD  glucose blood (ACCU-CHEK AVIVA) test strip Use as instructed 3 times before meals 08/26/16  Yes Amao, [name], MD  insulin aspart (NOVOLOG FLEXPEN) 100 UNIT/ML FlexPen 0-12 units before meals as per sliding scale as directed Patient taking differently: Inject 4-12 Units into the skin 3 (three) times daily with meals. 0-12 units before meals as per sliding scale as directed 10/14/16  Yes Amao, Odette Horns, MD  Insulin Glargine (LANTUS SOLOSTAR) 100 UNIT/ML Solostar Pen Inject 30 Units into the skin every morning. 12/23/16  Yes Richarda Overlie, MD  Insulin Pen Needle 31G X 5 MM MISC Use 3 times daily before meals and at bedtime 12/08/16  Yes Jaclyn Shaggy, MD  Lancet Devices [facility]) lancets Use as instructed 3 times before meals 08/26/16  Yes Amao, Odette Horns, MD  meloxicam (MOBIC) 7.5 MG tablet Take 1 tablet (7.5 mg total) by mouth daily as  needed for pain. 12/08/16  Yes Jaclyn Shaggy, MD  mirtazapine (REMERON) 30 MG tablet Take 1 tablet (30 mg total) by mouth at bedtime. 09/02/16  Yes Jaclyn Shaggy, MD  Multiple Vitamins-Minerals (MULTIVITAMIN WITH MINERALS) tablet Take 1 tablet by mouth daily.   Yes [provider]  nicotine (NICODERM CQ - DOSED IN MG/24 HOURS) 21 mg/24hr patch Place 1 patch (21 mg total) onto the skin daily. 12/23/16  Yes Richarda Overlie, MD  oxyCODONE-acetaminophen (PERCOCET) 5-325 MG tablet Take 1 tablet by mouth every 4 (four) hours as needed. 12/07/16  Yes Mancel Bale, MD  Pancrelipase, Lip-Prot-Amyl, (ZENPEP) 25000 units CPEP Take 25,000 Units by mouth 3 (three) times daily before meals. 11/03/16  Yes Jaclyn Shaggy, MD  polyethylene glycol (MIRALAX / GLYCOLAX) packet Take 17 g by mouth daily. 12/23/16  Yes Richarda Overlie, MD  traMADol (ULTRAM) 50 MG tablet Take 1 tablet (50 mg total) by mouth every 6 (six) hours as needed. 12/23/16  Yes Richarda Overlie, MD     Objective:  EXAM:   Vitals:   12/31/16 1136  BP: 115/81  Pulse: 88  Resp: 18  Temp: 97.7 F (36.5 C)  TempSrc: Oral  SpO2: 100%  Weight: 102 lb 12.8 oz (46.6 kg)  Height: 5\' 7"  (1.702 m)    General appearance : A&OX3. NAD. Non-toxic-appearing, very thin/almost cachectic appearing HEENT: Atraumatic and Normocephalic.  PERRLA. EOM intact.   Neck: supple, no JVD. No cervical lymphadenopathy. No thyromegaly Chest/Lungs:  Breathing-non-labored, Good air entry bilaterally, breath sounds normal without rales, rhonchi, or wheezing  CVS: S1 S2 regular, no murmurs, gallops, rubs  I&D site R ankle assessed-just proximal and above the medial malleolus-~3cm Incision site healing by secondary intention.  No active drainage or fluctuance.  (There is clear plasma on the bandage I removed).  No erythema or induration. Telfa placed, then gauze then ACE wrap applied.   Extremities: Bilateral Lower Ext shows no edema, both legs are warm to touch with = pulse  throughout Neurology:  CN II-XII grossly intact, Non focal.   Psych:  TP linear. J/I WNL. Normal speech. Appropriate eye contact and affect.  Skin:  No Rash  Data Review Lab Results  Component Value Date   HGBA1C 13.2 12/08/2016   HGBA1C 10.9 (H) 08/22/2016   HGBA1C 7.8 (H) 06/23/2016     Assessment & Plan   1. Type 2 diabetes mellitus with complication, without long-term current use of insulin (HCC) Continue current regimen with adequate food intake.  Increase water intake.   - Glucose (CBG) - Glucose (CBG)  2. Ankle cellulitis No sign of recurrence/active infection  Today.   Finish Doxy.  Keep f/up with ortho  3. Anemia, unspecified type - CBC with Differential/Platelet  4. Non compliance w medication regimen Multiple barriers/socioeconomic and substance abuse. 12 step meeting schedules given I have counseled the patient at length about substance abuse and addiction.  12 step meetings/recovery recommended.  Local 12 step meeting lists were given and attendance was encouraged.  Patient expresses understanding. He has attended some NA in the past. Proceed with treatment at [facility] if able.      5. Acute kidney injury (HCC) - Comprehensive metabolic panel  Patient have been counseled extensively about nutrition and exercise  Return in about 2 months (around 03/03/2017) for Dr Amao-DM.  The patient was given clear instructions to go to ER or return to medical center if symptoms don't improve, worsen or new problems develop. The patient verbalized understanding. The patient was told to call to get lab results if they haven't heard anything in the next week.     Georgian Co, PA-C [facility] [place], [place] [phone]   12/31/2016, 12:39 PM

## 2017-01-05 ENCOUNTER — Telehealth: Payer: Self-pay | Admitting: *Deleted

## 2017-01-05 LAB — CBC WITH DIFFERENTIAL/PLATELET
BASOS ABS: 0 10*3/uL (ref 0.0–0.2)
Basos: 1 %
EOS (ABSOLUTE): 0.1 10*3/uL (ref 0.0–0.4)
EOS: 2 %
HEMATOCRIT: 32 % — AB (ref 37.5–51.0)
HEMOGLOBIN: 9.8 g/dL — AB (ref 13.0–17.7)
IMMATURE GRANS (ABS): 0 10*3/uL (ref 0.0–0.1)
Immature Granulocytes: 0 %
LYMPHS: 43 %
Lymphocytes Absolute: 1.5 10*3/uL (ref 0.7–3.1)
MCH: 25.3 pg — ABNORMAL LOW (ref 26.6–33.0)
MCHC: 30.6 g/dL — ABNORMAL LOW (ref 31.5–35.7)
MCV: 83 fL (ref 79–97)
MONOCYTES: 11 %
Monocytes Absolute: 0.4 10*3/uL (ref 0.1–0.9)
Neutrophils Absolute: 1.5 10*3/uL (ref 1.4–7.0)
Neutrophils: 43 %
Platelets: 290 10*3/uL (ref 150–379)
RBC: 3.88 x10E6/uL — AB (ref 4.14–5.80)
RDW: 15.3 % (ref 12.3–15.4)
WBC: 3.5 10*3/uL (ref 3.4–10.8)

## 2017-01-05 LAB — COMPREHENSIVE METABOLIC PANEL
ALBUMIN: 3.9 g/dL (ref 3.5–5.5)
ALT: 54 IU/L — ABNORMAL HIGH (ref 0–44)
AST: 102 IU/L — ABNORMAL HIGH (ref 0–40)
Albumin/Globulin Ratio: 1.1 — ABNORMAL LOW (ref 1.2–2.2)
Alkaline Phosphatase: 185 IU/L — ABNORMAL HIGH (ref 39–117)
BUN / CREAT RATIO: 13 (ref 9–20)
BUN: 17 mg/dL (ref 6–24)
Bilirubin Total: 0.3 mg/dL (ref 0.0–1.2)
CALCIUM: 9.2 mg/dL (ref 8.7–10.2)
CO2: 16 mmol/L — AB (ref 20–29)
CREATININE: 1.28 mg/dL — AB (ref 0.76–1.27)
Chloride: 99 mmol/L (ref 96–106)
GFR, EST AFRICAN AMERICAN: 71 mL/min/{1.73_m2} (ref >59)
GFR, EST NON AFRICAN AMERICAN: 62 mL/min/{1.73_m2} (ref >59)
GLOBULIN, TOTAL: 3.6 g/dL (ref 1.5–4.5)
GLUCOSE: 557 mg/dL — AB (ref 65–99)
Potassium: 5.2 mmol/L (ref 3.5–5.2)
Sodium: 138 mmol/L (ref 134–144)
TOTAL PROTEIN: 7.5 g/dL (ref 6.0–8.5)

## 2017-01-05 NOTE — Telephone Encounter (Signed)
Verified DOB with [facility] representative.  Crystal called to report a critical Glucose level which resulted at 557. No further questions or concerns at this time.

## 2017-01-14 ENCOUNTER — Telehealth: Payer: Self-pay | Admitting: *Deleted

## 2017-01-14 NOTE — Telephone Encounter (Signed)
Patients sister took a message to have the [facility] in hand.

## 2017-01-14 NOTE — Telephone Encounter (Signed)
-----   Message from Anders SimmondsAngela M McClung, [place]-C sent at 01/08/2017  8:31 AM EDT ----- Please call patient to check status.  What are his blood sugars running at home?  Please tell him that his blood work showed very high blood sugars that could be very dangerous.  He should monitor this closely and follow-up as planned.  If his blood sugars are running higher than 400 and he is symptomatic, he should go to the ED.  If his blood sugars are improving, he can wait and follow-up as planned.  His anemia was stable.  His liver function and alkaline phosphatase were a little high and we will recheck these at a follow-up visit.  Thanks, Georgian CoAngela McClung, PA-C

## 2017-01-30 ENCOUNTER — Other Ambulatory Visit: Payer: Self-pay | Admitting: Family Medicine

## 2017-02-02 ENCOUNTER — Other Ambulatory Visit: Payer: Self-pay | Admitting: Family Medicine

## 2017-02-02 DIAGNOSIS — M009 Pyogenic arthritis, unspecified: Secondary | ICD-10-CM

## 2017-02-19 ENCOUNTER — Encounter (HOSPITAL_COMMUNITY): Payer: Self-pay

## 2017-02-19 ENCOUNTER — Emergency Department (HOSPITAL_COMMUNITY)
Admission: EM | Admit: 2017-02-19 | Discharge: 2017-02-19 | Disposition: A | Discharge status: Home / Self Care | Payer: Medicare Other | Attending: Emergency Medicine | Admitting: Emergency Medicine

## 2017-02-19 DIAGNOSIS — Z794 Long term (current) use of insulin: Secondary | ICD-10-CM | POA: Insufficient documentation

## 2017-02-19 DIAGNOSIS — Z7982 Long term (current) use of aspirin: Secondary | ICD-10-CM | POA: Diagnosis not present

## 2017-02-19 DIAGNOSIS — N182 Chronic kidney disease, stage 2 (mild): Secondary | ICD-10-CM | POA: Insufficient documentation

## 2017-02-19 DIAGNOSIS — E162 Hypoglycemia, unspecified: Secondary | ICD-10-CM

## 2017-02-19 DIAGNOSIS — F17210 Nicotine dependence, cigarettes, uncomplicated: Secondary | ICD-10-CM | POA: Insufficient documentation

## 2017-02-19 DIAGNOSIS — E11649 Type 2 diabetes mellitus with hypoglycemia without coma: Secondary | ICD-10-CM | POA: Insufficient documentation

## 2017-02-19 DIAGNOSIS — Z79899 Other long term (current) drug therapy: Secondary | ICD-10-CM | POA: Diagnosis not present

## 2017-02-19 LAB — CBG MONITORING, ED
GLUCOSE-CAPILLARY: 110 mg/dL — AB (ref 65–99)
GLUCOSE-CAPILLARY: 158 mg/dL — AB (ref 65–99)
GLUCOSE-CAPILLARY: 191 mg/dL — AB (ref 65–99)
Glucose-Capillary: 165 mg/dL — ABNORMAL HIGH (ref 65–99)
Glucose-Capillary: 176 mg/dL — ABNORMAL HIGH (ref 65–99)

## 2017-02-19 NOTE — ED Triage Notes (Signed)
Patient arrives from jail per EMS with complaints of hypoglycemia. Jail nurse went to do CBG on patient at 0400 per schedule and found patient unresponsive. Jail nurse gave 1 mg of glucagon IM and oral glucose which patient only took 25% of. EMS arrives and gave him an IV and a D10 infusion. Initial CBG 38.

## 2017-02-19 NOTE — ED Provider Notes (Signed)
[facility]-EMERGENCY DEPT Provider Note   CSN: [id number] Arrival date & time: 02/19/17  [id number]     History   Chief Complaint Chief Complaint  Patient presents with  . Hypoglycemia    HPI Raymond Winters is a 56 y.o. male PMH of DM presenting today with hypoglycemia.  Patient is in jail and was found on the ground by the gaurds.  His initial FS was in the 30s. He was given glucagon and oral glucose.  Also received D10 infusion from EMS.  He now feels back to his normal self. No further complaints.  10 Systems reviewed and are negative for acute change except as noted in the HPI.   HPI  Past Medical History:  Diagnosis Date  . Alcohol dependence (HCC) 01/05/2016  . Alcoholic liver disease, unspecified (HCC) 01/05/2016  . Arthritis    "lower extremeties, hands, shoulders, back" (08/20/2016)  . Chronic alcoholic pancreatitis (HCC) 01/05/2016  . Depression   . Family history of adverse reaction to anesthesia    pt's sister has hx. of post-op N/V  . Hepatitis C    "never treated" (08/20/2016)  . Heroin abuse   . High cholesterol   . Hypoalbuminemia due to protein-calorie malnutrition (HCC) 01/05/2016  . Insulin dependent diabetes mellitus (HCC)   . Lactic acid increased, CHRONIC 01/05/2016   Secondary to Liver Disease, Alcoholism, and Diabetes  . 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 Shoulder wound 01/2016   left    Patient Active Problem List   Diagnosis Date Noted  . Ankle pain, right   . Ankle cellulitis 12/15/2016  . Cellulitis of foot, right 12/14/2016  . Cellulitis of right ankle 12/14/2016  . Chronic kidney disease (CKD), stage II (mild) 12/14/2016  . Non compliance w medication regimen 12/08/2016  . Necrotizing fasciitis (HCC) 08/19/2016  . Hypoglycemia 06/22/2016  . Depression 02/03/2016  . Chronic hepatitis C (HCC)   . Alcoholic liver disease, unspecified (HCC) 01/05/2016  . Alcohol dependence (HCC) 01/05/2016  . Chronic alcoholic pancreatitis (HCC)  01/05/2016  . Hypoalbuminemia due to protein-calorie malnutrition (HCC) 01/05/2016  . Lactic acid increased, CHRONIC 01/05/2016  . Diarrhea 01/04/2016  . Chronic diarrhea 01/04/2016  . Pressure ulcer 01/04/2016  . Uncontrolled type 2 diabetes mellitus with complication, with long-term current use of insulin (HCC)   . Swelling   . Type 2 diabetes mellitus with complication, with long-term current use of insulin (HCC)   . Septic joint of left shoulder region (HCC)   . Abscess 12/24/2015  . Abscess of left shoulder   . Leukocytosis   . Type 2 diabetes mellitus with complication, without long-term current use of insulin (HCC)   . Glenohumeral arthritis 07/24/2015  . Left shoulder pain 07/24/2015  . 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 Tobacco abuse 07/26/2014  . Protein-calorie malnutrition, severe (HCC) 12/05/2013  . Hyperkalemia 12/04/2013  . Heroin withdrawal (HCC) 12/04/2013  . Alcohol abuse 12/04/2013  . Hyperglycemia 12/04/2013  . Metabolic acidosis with respiratory alkalosis 12/04/2013  . Heroin abuse 04/04/2013  . DM2 (diabetes mellitus, type 2) (HCC) 03/20/2013    Past Surgical History:  Procedure Laterality Date  . APPLICATION OF WOUND VAC Left 12/24/2015   Procedure: APPLICATION OF WOUND VAC; LEFT SHOULDER;  Surgeon: Tarry KosNaiping M Xu, MD;  Location: [facility] OR;  Service: Orthopedics;  Laterality: Left;  . APPLICATION OF WOUND VAC Left 01/29/2016   Procedure: LEFT SHOULDER SPLIT THICKNESS SKIN GRAFT, WOUND VAC;  Surgeon: Tarry KosNaiping M Xu, MD;  Location:  [facility];  Service: Orthopedics;  Laterality: Left;  . I&D EXTREMITY Right 03/20/2013   Procedure: IRRIGATION AND DEBRIDEMENT EXTREMITY;  Surgeon: Marlowe Shores, MD;  Location: [facility] OR;  Service: Orthopedics;  Laterality: Right;  . I&D EXTREMITY Left 12/24/2015   Procedure:  DEBRIDEMENT OF MUSCLE, SUBCUTANEOUS TISSUE LEFT SHOULDER;  Surgeon: Tarry Kos, MD;  Location: [facility] OR;  Service: Orthopedics;  Laterality: Left;  . I&D  EXTREMITY Right 12/18/2016   Procedure: INCISION AND DRAINAGE ANKLE ABSCESS;  Surgeon: Sheral Apley, MD;  [facility] OR;  Service: Orthopedics;  Laterality: Right;  . INCISION AND DRAINAGE ABSCESS Right 08/19/2016   Procedure: DEBRIDEMENT RIGHT BUTTOCK  ABSCESS;  Surgeon: Manus Rudd, MD;  Location: [facility] OR;  Service: General;  Laterality: Right;  . IRRIGATION AND DEBRIDEMENT ABSCESS Right 08/19/2016   buttocks  . IRRIGATION AND DEBRIDEMENT SHOULDER Left 12/27/2015   Procedure: IRRIGATION AND DEBRIDEMENT LEFT SHOULDER;  Surgeon: Tarry Kos, MD;  Location: [facility] OR;  Service: Orthopedics;  Laterality: Left;  . IRRIGATION AND DEBRIDEMENT SHOULDER Left 12/30/2015   Procedure: IRRIGATION AND DEBRIDEMENT LEFT SHOULDER;  Surgeon: Tarry Kos, MD;  Location: [facility] OR;  Service: Orthopedics;  Laterality: Left;  . SKIN SPLIT GRAFT Left 01/29/2016   Procedure: SKIN GRAFT SPLIT THICKNESS,;  Surgeon: Tarry Kos, MD;  Location: [facility];  Service: Orthopedics;  Laterality: Left;       Home Medications    Prior to Admission medications   Medication Sig Start Date End Date Taking? Authorizing Provider  aspirin EC 81 MG tablet Take 1 tablet (81 mg total) by mouth daily. 04/04/14  Yes Ambrose Finland, NP  atorvastatin (LIPITOR) 40 MG tablet Take 1 tablet (40 mg total) by mouth daily. 06/25/16  Yes Jaclyn Shaggy, MD  ferrous sulfate 325 (65 FE) MG tablet Take 1 tablet (325 mg total) by mouth 2 (two) times daily with a meal. 12/23/16  Yes Abrol, Germain Osgood, MD  gabapentin (NEURONTIN) 300 MG capsule TAKE 2 CAPSULE BY MOUTH AT BEDTIME FOR DIABETIC NERVE PAIN Patient taking differently: Take 600 mg by mouth at bedtime. FOR DIABETIC NERVE PAIN 12/11/16  Yes Jaclyn Shaggy, MD  meloxicam (MOBIC) 7.5 MG tablet TAKE 1 TABLET(7.5 MG) BY MOUTH DAILY AS NEEDED FOR PAIN 02/02/17  Yes Jaclyn Shaggy, MD  mirtazapine (REMERON) 30 MG tablet TAKE 1 TABLET BY MOUTH AT BEDTIME 02/01/17  Yes Amao, Odette Horns, MD  Multiple  Vitamins-Minerals (MULTIVITAMIN WITH MINERALS) tablet Take 1 tablet by mouth daily.   Yes [provider]  Pancrelipase, Lip-Prot-Amyl, (ZENPEP) 25000 units CPEP Take 25,000 Units by mouth 3 (three) times daily before meals. 11/03/16  Yes Jaclyn Shaggy, MD  polyethylene glycol (MIRALAX / GLYCOLAX) packet Take 17 g by mouth daily. 12/23/16  Yes Richarda Overlie, MD  Blood Glucose Monitoring Suppl (ACCU-CHEK AVIVA) device Use as instructed 3 times before meals 09/10/16   Jaclyn Shaggy, MD  glucose blood (ACCU-CHEK AVIVA) test strip Use as instructed 3 times before meals 08/26/16   Jaclyn Shaggy, MD  insulin aspart (NOVOLOG FLEXPEN) 100 UNIT/ML FlexPen 0-12 units before meals as per sliding scale as directed Patient not taking: Reported on 02/19/2017 10/14/16   Jaclyn Shaggy, MD  Insulin Glargine (LANTUS SOLOSTAR) 100 UNIT/ML Solostar Pen Inject 30 Units into the skin every morning. Patient not taking: Reported on 02/19/2017 12/23/16   Richarda Overlie, MD  Insulin Pen Needle 31G X 5 MM MISC Use 3 times daily before meals and at bedtime 12/08/16  Jaclyn Shaggy, MD  Lancet Devices [facility]) lancets Use as instructed 3 times before meals 08/26/16   Jaclyn Shaggy, MD  nicotine (NICODERM CQ - DOSED IN MG/24 HOURS) 21 mg/24hr patch Place 1 patch (21 mg total) onto the skin daily. Patient not taking: Reported on 02/19/2017 12/23/16   Richarda Overlie, MD  oxyCODONE-acetaminophen (PERCOCET) 5-325 MG tablet Take 1 tablet by mouth every 4 (four) hours as needed. Patient not taking: Reported on 02/19/2017 12/07/16   Mancel Bale, MD  traMADol (ULTRAM) 50 MG tablet Take 1 tablet (50 mg total) by mouth every 6 (six) hours as needed. Patient not taking: Reported on 02/19/2017 12/23/16   Richarda Overlie, MD    Family History Family History  Problem Relation Age of Onset  . Diabetes Mother   . Heart disease Mother   . Hypertension Mother   . Stroke Mother   . Vision loss Mother   . Heart disease Father   .  Hypertension Father   . Anesthesia problems Sister        post-op N/V    Social History Social History  Substance Use Topics  . Smoking status: Current Every Day Smoker    Packs/day: 0.50    Years: 37.00    Types: Cigarettes  . Smokeless tobacco: Never Used  . Alcohol use No     Comment: 08/20/2016 "stopped ~ 1 month ago"     Allergies   Ativan [lorazepam]   Review of Systems Review of Systems   Physical Exam Updated Vital Signs BP (!) 147/75 (BP Location: Right Arm)   Pulse 72   Resp 20   SpO2 100%   Physical Exam  Constitutional: He is oriented to person, place, and time. Vital signs are normal. He appears well-developed and well-nourished.  Non-toxic appearance. He does not appear ill. No distress.  HENT:  Head: Normocephalic and atraumatic.  Nose: Nose normal.  Mouth/Throat: Oropharynx is clear and moist. No oropharyngeal exudate.  Eyes: Pupils are equal, round, and reactive to light. Conjunctivae and EOM are normal. No scleral icterus.  Neck: Normal range of motion. Neck supple. No tracheal deviation, no edema, no erythema and normal range of motion present. No thyroid mass and no thyromegaly present.  Cardiovascular: Normal rate, regular rhythm, S1 normal, S2 normal, normal heart sounds, intact distal pulses and normal pulses.  Exam reveals no gallop and no friction rub.   No murmur heard. Pulmonary/Chest: Effort normal and breath sounds normal. No respiratory distress. He has no wheezes. He has no rhonchi. He has no rales.  Abdominal: Soft. Normal appearance and bowel sounds are normal. He exhibits no distension, no ascites and no mass. There is no hepatosplenomegaly. There is no tenderness. There is no rebound, no guarding and no CVA tenderness.  Musculoskeletal: Normal range of motion. He exhibits no edema or tenderness.  Lymphadenopathy:    He has no cervical adenopathy.  Neurological: He is alert and oriented to person, place, and time. He has normal strength.  No cranial nerve deficit or sensory deficit.  Skin: Skin is warm, dry and intact. No petechiae and no rash noted. He is not diaphoretic. No erythema. No pallor.  Nursing note and vitals reviewed.    ED Treatments / Results  Labs (all labs ordered are listed, but only abnormal results are displayed) Labs Reviewed  CBG MONITORING, ED - Abnormal; Notable for the following:       Result Value   Glucose-Capillary 110 (*)    All other components within  normal limits  CBG MONITORING, ED - Abnormal; Notable for the following:    Glucose-Capillary 158 (*)    All other components within normal limits  CBG MONITORING, ED - Abnormal; Notable for the following:    Glucose-Capillary 165 (*)    All other components within normal limits    EKG  EKG Interpretation None       Radiology No results found.  Procedures Procedures (including critical care time)  Medications Ordered in ED Medications - No data to display   Initial Impression / Assessment and Plan / ED Course  I have reviewed the triage vital signs and the nursing notes.  Pertinent labs & imaging results that were available during my care of the patient were reviewed by me and considered in my medical decision making (see chart for details).     Patient presents to the ED for hypoglycemia. He was immediately given a meal in the ED and his blood sugar was tested every hour.  Values are 110, 158, 176.  He is now medically clear and safe to go back to the jail.  Advised to have medical staff address his dosing and diet.  Final Clinical Impressions(s) / ED Diagnoses   Final diagnoses:  None    New Prescriptions New Prescriptions   No medications on file     Tomasita Crumble, MD 02/19/17 [phone]

## 2017-02-19 NOTE — ED Notes (Signed)
Patient is awake and alert-given ham sandwich, milk, cheese and applesauce

## 2017-02-19 NOTE — ED Notes (Signed)
Bed: RESA Expected date:  Expected time:  Means of arrival:  Comments: EMS hypoglycemia from jail   Armida SansDoster, Jacobus Colvin A, [place]RN 02/19/17 [phone]

## 2017-02-19 NOTE — ED Notes (Signed)
Patient ate all food given-given additional peanut butter and crackers and another cup of milk

## 2017-02-20 ENCOUNTER — Emergency Department (HOSPITAL_COMMUNITY): Payer: Medicare Other

## 2017-02-20 ENCOUNTER — Encounter (HOSPITAL_COMMUNITY): Payer: Self-pay | Admitting: Emergency Medicine

## 2017-02-20 ENCOUNTER — Observation Stay (HOSPITAL_COMMUNITY)
Admission: EM | Admit: 2017-02-20 | Discharge: 2017-02-21 | Disposition: A | Discharge status: Home / Self Care | Payer: Medicare Other | Attending: Internal Medicine | Admitting: Internal Medicine

## 2017-02-20 DIAGNOSIS — F1910 Other psychoactive substance abuse, uncomplicated: Secondary | ICD-10-CM | POA: Diagnosis not present

## 2017-02-20 DIAGNOSIS — F329 Major depressive disorder, single episode, unspecified: Secondary | ICD-10-CM | POA: Diagnosis not present

## 2017-02-20 DIAGNOSIS — E1100 Type 2 diabetes mellitus with hyperosmolarity without nonketotic hyperglycemic-hyperosmolar coma (NKHHC): Secondary | ICD-10-CM

## 2017-02-20 DIAGNOSIS — Z79899 Other long term (current) drug therapy: Secondary | ICD-10-CM | POA: Insufficient documentation

## 2017-02-20 DIAGNOSIS — E11649 Type 2 diabetes mellitus with hypoglycemia without coma: Principal | ICD-10-CM | POA: Diagnosis present

## 2017-02-20 DIAGNOSIS — E162 Hypoglycemia, unspecified: Secondary | ICD-10-CM | POA: Diagnosis present

## 2017-02-20 DIAGNOSIS — F17210 Nicotine dependence, cigarettes, uncomplicated: Secondary | ICD-10-CM | POA: Insufficient documentation

## 2017-02-20 DIAGNOSIS — N189 Chronic kidney disease, unspecified: Secondary | ICD-10-CM | POA: Insufficient documentation

## 2017-02-20 DIAGNOSIS — E785 Hyperlipidemia, unspecified: Secondary | ICD-10-CM | POA: Diagnosis not present

## 2017-02-20 DIAGNOSIS — E1140 Type 2 diabetes mellitus with diabetic neuropathy, unspecified: Secondary | ICD-10-CM | POA: Diagnosis not present

## 2017-02-20 DIAGNOSIS — Z794 Long term (current) use of insulin: Secondary | ICD-10-CM | POA: Insufficient documentation

## 2017-02-20 DIAGNOSIS — E1122 Type 2 diabetes mellitus with diabetic chronic kidney disease: Secondary | ICD-10-CM | POA: Insufficient documentation

## 2017-02-20 DIAGNOSIS — E1129 Type 2 diabetes mellitus with other diabetic kidney complication: Secondary | ICD-10-CM

## 2017-02-20 DIAGNOSIS — R55 Syncope and collapse: Secondary | ICD-10-CM | POA: Diagnosis not present

## 2017-02-20 LAB — GLUCOSE, CAPILLARY
GLUCOSE-CAPILLARY: 363 mg/dL — AB (ref 65–99)
Glucose-Capillary: 239 mg/dL — ABNORMAL HIGH (ref 65–99)
Glucose-Capillary: 314 mg/dL — ABNORMAL HIGH (ref 65–99)
Glucose-Capillary: 430 mg/dL — ABNORMAL HIGH (ref 65–99)

## 2017-02-20 LAB — CBC
HEMATOCRIT: 31.8 % — AB (ref 39.0–52.0)
Hemoglobin: 10.6 g/dL — ABNORMAL LOW (ref 13.0–17.0)
MCH: 26.7 pg (ref 26.0–34.0)
MCHC: 33.3 g/dL (ref 30.0–36.0)
MCV: 80.1 fL (ref 78.0–100.0)
Platelets: 154 10*3/uL (ref 150–400)
RBC: 3.97 MIL/uL — ABNORMAL LOW (ref 4.22–5.81)
RDW: 15.3 % (ref 11.5–15.5)
WBC: 3.2 10*3/uL — ABNORMAL LOW (ref 4.0–10.5)

## 2017-02-20 LAB — HEPATIC FUNCTION PANEL
ALK PHOS: 81 U/L (ref 38–126)
ALT: 70 U/L — AB (ref 17–63)
AST: 129 U/L — AB (ref 15–41)
Albumin: 3.2 g/dL — ABNORMAL LOW (ref 3.5–5.0)
Bilirubin, Direct: 0.2 mg/dL (ref 0.1–0.5)
Indirect Bilirubin: 0.3 mg/dL (ref 0.3–0.9)
TOTAL PROTEIN: 6.9 g/dL (ref 6.5–8.1)
Total Bilirubin: 0.5 mg/dL (ref 0.3–1.2)

## 2017-02-20 LAB — BASIC METABOLIC PANEL
Anion gap: 8 (ref 5–15)
BUN: 14 mg/dL (ref 6–20)
CALCIUM: 8.8 mg/dL — AB (ref 8.9–10.3)
CO2: 25 mmol/L (ref 22–32)
CREATININE: 0.93 mg/dL (ref 0.61–1.24)
Chloride: 109 mmol/L (ref 101–111)
GFR calc Af Amer: >60 mL/min (ref >60)
GFR calc non Af Amer: >60 mL/min (ref >60)
GLUCOSE: 38 mg/dL — AB (ref 65–99)
Potassium: 3.8 mmol/L (ref 3.5–5.1)
Sodium: 142 mmol/L (ref 135–145)

## 2017-02-20 LAB — CBG MONITORING, ED
Glucose-Capillary: 54 mg/dL — ABNORMAL LOW (ref 65–99)
Glucose-Capillary: 159 mg/dL — ABNORMAL HIGH (ref 65–99)
Glucose-Capillary: 32 mg/dL — CL (ref 65–99)
Glucose-Capillary: 234 mg/dL — ABNORMAL HIGH (ref 65–99)

## 2017-02-20 LAB — I-STAT TROPONIN, ED: Troponin i, poc: 0 ng/mL (ref 0.00–0.08)

## 2017-02-20 MED ORDER — SODIUM CHLORIDE 0.9 % IV SOLN: 250.0000 mL | INTRAVENOUS | Status: DC | PRN

## 2017-02-20 MED ORDER — GABAPENTIN 300 MG PO CAPS
600.0000 mg | ORAL_CAPSULE | Freq: Every day | ORAL | Status: DC
  Administered 2017-02-20: 600 mg via ORAL
  Filled 2017-02-20: qty 2

## 2017-02-20 MED ORDER — DIPHENHYDRAMINE HCL 25 MG PO CAPS
25.0000 mg | ORAL_CAPSULE | Freq: Every evening | ORAL | Status: DC | PRN
  Administered 2017-02-20: 25 mg via ORAL
  Filled 2017-02-20: qty 1

## 2017-02-20 MED ORDER — ACETAMINOPHEN 325 MG PO TABS: 650.0000 mg | ORAL_TABLET | Freq: Four times a day (QID) | ORAL | Status: DC | PRN

## 2017-02-20 MED ORDER — PANCRELIPASE (LIP-PROT-AMYL) 12000-38000 UNITS PO CPEP
24000.0000 [IU] | ORAL_CAPSULE | Freq: Three times a day (TID) | ORAL | Status: DC
  Administered 2017-02-20: 12000 [IU] via ORAL
  Administered 2017-02-20 – 2017-02-21 (×3): 24000 [IU] via ORAL
  Filled 2017-02-20 (×4): qty 2

## 2017-02-20 MED ORDER — MIRTAZAPINE 15 MG PO TABS
30.0000 mg | ORAL_TABLET | Freq: Every day | ORAL | Status: DC
  Administered 2017-02-20: 30 mg via ORAL
  Filled 2017-02-20: qty 2

## 2017-02-20 MED ORDER — DEXTROSE 10 % IV SOLN
INTRAVENOUS | Status: DC
  Filled 2017-02-20: qty 1000

## 2017-02-20 MED ORDER — TRAZODONE HCL 50 MG PO TABS: 50.0000 mg | ORAL_TABLET | Freq: Every evening | ORAL | Status: DC | PRN

## 2017-02-20 MED ORDER — ASPIRIN EC 81 MG PO TBEC
81.0000 mg | DELAYED_RELEASE_TABLET | Freq: Every day | ORAL | Status: DC
  Administered 2017-02-20 – 2017-02-21 (×2): 81 mg via ORAL
  Filled 2017-02-20 (×2): qty 1

## 2017-02-20 MED ORDER — POLYETHYLENE GLYCOL 3350 17 G PO PACK
17.0000 g | PACK | Freq: Every day | ORAL | Status: DC
  Administered 2017-02-20: 17 g via ORAL
  Filled 2017-02-20: qty 1

## 2017-02-20 MED ORDER — INSULIN ASPART 100 UNIT/ML ~~LOC~~ SOLN
0.0000 [IU] | Freq: Three times a day (TID) | SUBCUTANEOUS | Status: DC
  Administered 2017-02-21: 7 [IU] via SUBCUTANEOUS

## 2017-02-20 MED ORDER — DEXTROSE 50 % IV SOLN
1.0000 | Freq: Once | INTRAVENOUS | Status: AC
  Administered 2017-02-20: 50 mL via INTRAVENOUS
  Filled 2017-02-20: qty 50

## 2017-02-20 MED ORDER — ADULT MULTIVITAMIN W/MINERALS CH
1.0000 | ORAL_TABLET | Freq: Every day | ORAL | Status: DC
  Administered 2017-02-20 – 2017-02-21 (×2): 1 via ORAL
  Filled 2017-02-20 (×2): qty 1

## 2017-02-20 MED ORDER — INSULIN GLARGINE 100 UNIT/ML ~~LOC~~ SOLN
25.0000 [IU] | Freq: Every day | SUBCUTANEOUS | Status: DC
  Administered 2017-02-20: 25 [IU] via SUBCUTANEOUS
  Filled 2017-02-20: qty 0.25

## 2017-02-20 MED ORDER — ATORVASTATIN CALCIUM 40 MG PO TABS
40.0000 mg | ORAL_TABLET | Freq: Every day | ORAL | Status: DC
  Administered 2017-02-20 – 2017-02-21 (×2): 40 mg via ORAL
  Filled 2017-02-20 (×2): qty 1

## 2017-02-20 MED ORDER — ACETAMINOPHEN 650 MG RE SUPP: 650.0000 mg | Freq: Four times a day (QID) | RECTAL | Status: DC | PRN

## 2017-02-20 MED ORDER — SODIUM CHLORIDE 0.9% FLUSH: 3.0000 mL | INTRAVENOUS | Status: DC | PRN

## 2017-02-20 MED ORDER — SODIUM CHLORIDE 0.9% FLUSH
3.0000 mL | Freq: Two times a day (BID) | INTRAVENOUS | Status: DC
  Administered 2017-02-20 – 2017-02-21 (×2): 3 mL via INTRAVENOUS

## 2017-02-20 NOTE — ED Notes (Signed)
Pt provided with sandwich, cheese, peanut butter and OJ

## 2017-02-20 NOTE — ED Notes (Signed)
Bed: RESA Expected date:  Expected time:  Means of arrival:  Comments: 56 yo M hypoglycemia

## 2017-02-20 NOTE — ED Triage Notes (Signed)
[facility]IB [facility] from [facility] for cbg of 14. Patient found unresponsive, diaphoretic, hypoxic. Pt given 1 amp of D50 by EMS. Patient quickly came to, a/o x4 denies pain. [facility]  Patient normally takes Lantus but insulin was recently changed since being at the jail.

## 2017-02-20 NOTE — ED Notes (Signed)
Dr Erma HeritageIsaacs notified that pt CBG 32

## 2017-02-20 NOTE — ED Provider Notes (Signed)
[facility]-EMERGENCY DEPT Provider Note   CSN: [id number] Arrival date & time: 02/20/17  4098     History   Chief Complaint Chief Complaint  Patient presents with  . Hypoglycemia    HPI Raymond Winters is a 56 y.o. male.  HPI   56 yo M with h/o Hep C, CKD, DM here with hypoglycemia. Pt currently incarcerated. Per report from pt and police, he has been incarcerated x 1 month. Over the last week, pt has been having to adjust his SSI due to recurrent episodes of severe hypoglycemia. Yesterday, pt had BG in the 30s in the AM and was given glucagon, oral glucose, and D10. He was seen in the ED with stable BS and sent home. Pt reportedly had a sugar of 200s this AM, took his SSI, then immediately plummeted to BG of 14. At BG of 14, he was unresponsive, posturing, and required NRB. He was given D50 with improvement back to >200. Currently, he feels "shaky." Denies any other complaints. No pain at this time. No recent med changes, has reportedly been on insulin since incarceration >30 days ago, though he normally takes Lantus, not Novolin, at home.  Past Medical History:  Diagnosis Date  . Alcohol dependence (HCC) 01/05/2016  . Alcoholic liver disease, unspecified (HCC) 01/05/2016  . Arthritis    "lower extremeties, hands, shoulders, back" (08/20/2016)  . Chronic alcoholic pancreatitis (HCC) 01/05/2016  . Depression   . Family history of adverse reaction to anesthesia    pt's sister has hx. of post-op N/V  . Hepatitis C    "never treated" (08/20/2016)  . Heroin abuse   . High cholesterol   . Hypoalbuminemia due to protein-calorie malnutrition (HCC) 01/05/2016  . Insulin dependent diabetes mellitus (HCC)   . Lactic acid increased, CHRONIC 01/05/2016   Secondary to Liver Disease, Alcoholism, and Diabetes  . 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 Shoulder wound 01/2016   left    Patient Active Problem List   Diagnosis Date Noted  . Ankle pain, right   . Ankle cellulitis  12/15/2016  . Cellulitis of foot, right 12/14/2016  . Cellulitis of right ankle 12/14/2016  . Chronic kidney disease (CKD), stage II (mild) 12/14/2016  . Non compliance w medication regimen 12/08/2016  . Necrotizing fasciitis (HCC) 08/19/2016  . Hypoglycemia 06/22/2016  . Depression 02/03/2016  . Chronic hepatitis C (HCC)   . Alcoholic liver disease, unspecified (HCC) 01/05/2016  . Alcohol dependence (HCC) 01/05/2016  . Chronic alcoholic pancreatitis (HCC) 01/05/2016  . Hypoalbuminemia due to protein-calorie malnutrition (HCC) 01/05/2016  . Lactic acid increased, CHRONIC 01/05/2016  . Diarrhea 01/04/2016  . Chronic diarrhea 01/04/2016  . Pressure ulcer 01/04/2016  . Uncontrolled type 2 diabetes mellitus with complication, with long-term current use of insulin (HCC)   . Swelling   . Type 2 diabetes mellitus with complication, with long-term current use of insulin (HCC)   . Septic joint of left shoulder region (HCC)   . Abscess 12/24/2015  . Abscess of left shoulder   . Leukocytosis   . Type 2 diabetes mellitus with complication, without long-term current use of insulin (HCC)   . Glenohumeral arthritis 07/24/2015  . Left shoulder pain 07/24/2015  . 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 Tobacco abuse 07/26/2014  . Protein-calorie malnutrition, severe (HCC) 12/05/2013  . Hyperkalemia 12/04/2013  . Heroin withdrawal (HCC) 12/04/2013  . Alcohol abuse 12/04/2013  . Hyperglycemia 12/04/2013  . Metabolic acidosis with respiratory alkalosis 12/04/2013  .  Heroin abuse 04/04/2013  . DM2 (diabetes mellitus, type 2) (HCC) 03/20/2013    Past Surgical History:  Procedure Laterality Date  . APPLICATION OF WOUND VAC Left 12/24/2015   Procedure: APPLICATION OF WOUND VAC; LEFT SHOULDER;  Surgeon: Tarry Kos, MD;  Location: [facility] OR;  Service: Orthopedics;  Laterality: Left;  . APPLICATION OF WOUND VAC Left 01/29/2016   Procedure: LEFT SHOULDER SPLIT THICKNESS SKIN GRAFT, WOUND VAC;  Surgeon: Tarry Kos, MD;  Location: [facility];  Service: Orthopedics;  Laterality: Left;  . I&D EXTREMITY Right 03/20/2013   Procedure: IRRIGATION AND DEBRIDEMENT EXTREMITY;  Surgeon: Marlowe Shores, MD;  Location: [facility] OR;  Service: Orthopedics;  Laterality: Right;  . I&D EXTREMITY Left 12/24/2015   Procedure:  DEBRIDEMENT OF MUSCLE, SUBCUTANEOUS TISSUE LEFT SHOULDER;  Surgeon: Tarry Kos, MD;  Location: [facility] OR;  Service: Orthopedics;  Laterality: Left;  . I&D EXTREMITY Right 12/18/2016   Procedure: INCISION AND DRAINAGE ANKLE ABSCESS;  Surgeon: Sheral Apley, MD;  Location: [facility] OR;  Service: Orthopedics;  Laterality: Right;  . INCISION AND DRAINAGE ABSCESS Right 08/19/2016   Procedure: DEBRIDEMENT RIGHT BUTTOCK  ABSCESS;  Surgeon: Manus Rudd, MD;  Location: [facility] OR;  Service: General;  Laterality: Right;  . IRRIGATION AND DEBRIDEMENT ABSCESS Right 08/19/2016   buttocks  . IRRIGATION AND DEBRIDEMENT SHOULDER Left 12/27/2015   Procedure: IRRIGATION AND DEBRIDEMENT LEFT SHOULDER;  Surgeon: Tarry Kos, MD;  Location: [facility] OR;  Service: Orthopedics;  Laterality: Left;  . IRRIGATION AND DEBRIDEMENT SHOULDER Left 12/30/2015   Procedure: IRRIGATION AND DEBRIDEMENT LEFT SHOULDER;  Surgeon: Tarry Kos, MD;  Location: [facility] OR;  Service: Orthopedics;  Laterality: Left;  . SKIN SPLIT GRAFT Left 01/29/2016   Procedure: SKIN GRAFT SPLIT THICKNESS,;  Surgeon: Tarry Kos, MD;  Location: [facility];  Service: Orthopedics;  Laterality: Left;       Home Medications    Prior to Admission medications   Medication Sig Start Date End Date Taking? Authorizing Provider  insulin regular (NOVOLIN R,HUMULIN R) 100 units/mL injection Inject 18 Units into the skin 3 (three) times daily before meals.   Yes [provider]  mirtazapine (REMERON) 30 MG tablet TAKE 1 TABLET BY MOUTH AT BEDTIME 02/01/17  Yes Jaclyn Shaggy, MD  aspirin EC 81 MG tablet Take 1 tablet (81 mg total) by mouth daily.  04/04/14   Ambrose Finland, NP  atorvastatin (LIPITOR) 40 MG tablet Take 1 tablet (40 mg total) by mouth daily. 06/25/16   Jaclyn Shaggy, MD  Blood Glucose Monitoring Suppl (ACCU-CHEK AVIVA) device Use as instructed 3 times before meals 09/10/16   Jaclyn Shaggy, MD  ferrous sulfate 325 (65 FE) MG tablet Take 1 tablet (325 mg total) by mouth 2 (two) times daily with a meal. 12/23/16   Richarda Overlie, MD  gabapentin (NEURONTIN) 300 MG capsule TAKE 2 CAPSULE BY MOUTH AT BEDTIME FOR DIABETIC NERVE PAIN Patient taking differently: Take 600 mg by mouth at bedtime. FOR DIABETIC NERVE PAIN 12/11/16   Jaclyn Shaggy, MD  glucose blood (ACCU-CHEK AVIVA) test strip Use as instructed 3 times before meals 08/26/16   Jaclyn Shaggy, MD  Insulin Pen Needle 31G X 5 MM MISC Use 3 times daily before meals and at bedtime 12/08/16   Jaclyn Shaggy, MD  Lancet Devices [facility]) lancets Use as instructed 3 times before meals 08/26/16   Jaclyn Shaggy, MD  meloxicam (MOBIC) 7.5 MG tablet TAKE 1 TABLET(7.5 MG) BY  MOUTH DAILY AS NEEDED FOR PAIN 02/02/17   Jaclyn Shaggy, MD  Multiple Vitamins-Minerals (MULTIVITAMIN WITH MINERALS) tablet Take 1 tablet by mouth daily.    [provider]  Pancrelipase, Lip-Prot-Amyl, (ZENPEP) 25000 units CPEP Take 25,000 Units by mouth 3 (three) times daily before meals. 11/03/16   Jaclyn Shaggy, MD  polyethylene glycol (MIRALAX / GLYCOLAX) packet Take 17 g by mouth daily. 12/23/16   Richarda Overlie, MD    Family History Family History  Problem Relation Age of Onset  . Diabetes Mother   . Heart disease Mother   . Hypertension Mother   . Stroke Mother   . Vision loss Mother   . Heart disease Father   . Hypertension Father   . Anesthesia problems Sister        post-op N/V    Social History Social History  Substance Use Topics  . Smoking status: Current Every Day Smoker    Packs/day: 0.50    Years: 37.00    Types: Cigarettes  . Smokeless tobacco: Never Used  . Alcohol use No      Comment: 08/20/2016 "stopped ~ 1 month ago"     Allergies   Ativan [lorazepam]   Review of Systems Review of Systems  Constitutional: Positive for fatigue. Negative for chills and fever.  HENT: Negative for congestion and rhinorrhea.   Eyes: Negative for visual disturbance.  Respiratory: Negative for cough, shortness of breath and wheezing.   Cardiovascular: Negative for chest pain and leg swelling.  Gastrointestinal: Negative for abdominal pain, diarrhea, nausea and vomiting.  Genitourinary: Negative for dysuria and flank pain.  Musculoskeletal: Negative for neck pain and neck stiffness.  Skin: Negative for rash and wound.  Allergic/Immunologic: Negative for immunocompromised state.  Neurological: Positive for tremors and weakness. Negative for syncope and headaches.  All other systems reviewed and are negative.    Physical Exam Updated Vital Signs SpO2 100%   Physical Exam  Constitutional: He is oriented to person, place, and time. He appears well-developed and well-nourished. No distress.  HENT:  Head: Normocephalic and atraumatic.  Eyes: Conjunctivae are normal.  Neck: Neck supple.  Cardiovascular: Normal rate, regular rhythm and normal heart sounds.  Exam reveals no friction rub.   No murmur heard. Pulmonary/Chest: Effort normal and breath sounds normal. No respiratory distress. He has no wheezes. He has no rales.  Abdominal: Soft. Bowel sounds are normal. He exhibits no distension.  Musculoskeletal: He exhibits no edema.  Neurological: He is alert and oriented to person, place, and time. He exhibits normal muscle tone.  Skin: Skin is warm. Capillary refill takes less than 2 seconds.  Psychiatric: He has a normal mood and affect.  Nursing note and vitals reviewed.    ED Treatments / Results  Labs (all labs ordered are listed, but only abnormal results are displayed) Labs Reviewed  CBC - Abnormal; Notable for the following:       Result Value   WBC 3.2 (*)     RBC 3.97 (*)    Hemoglobin 10.6 (*)    HCT 31.8 (*)    All other components within normal limits  BASIC METABOLIC PANEL - Abnormal; Notable for the following:    Glucose, Bld 38 (*)    Calcium 8.8 (*)    All other components within normal limits  HEPATIC FUNCTION PANEL - Abnormal; Notable for the following:    Albumin 3.2 (*)    AST 129 (*)    ALT 70 (*)    All other  components within normal limits  CBG MONITORING, ED - Abnormal; Notable for the following:    Glucose-Capillary 54 (*)    All other components within normal limits  CBG MONITORING, ED - Abnormal; Notable for the following:    Glucose-Capillary 32 (*)    All other components within normal limits  CBG MONITORING, ED - Abnormal; Notable for the following:    Glucose-Capillary 159 (*)    All other components within normal limits  I-STAT TROPONIN, ED  CBG MONITORING, ED  CBG MONITORING, ED    EKG  EKG Interpretation None       Radiology Dg Chest 2 View  Result Date: 02/20/2017 CLINICAL DATA:  Recurrent hypoglycemia. Patient was found unresponsive, diaphoretic, and hypoxic 2 day. EXAM: CHEST  2 VIEW COMPARISON:  One-view chest x-ray 01/03/2016 FINDINGS: The heart size is normal. The lungs are hyperinflated. No focal airspace disease is present. There is no edema or effusion. The visualized soft tissues and bony thorax are unremarkable. IMPRESSION: Negative two view chest. Electronically Signed   By: Marin Robertshristopher  Mattern M.D.   On: 02/20/2017 08:27    Procedures .Critical Care Performed by: Shaune PollackISAACS, Takya Vandivier Authorized by: Shaune PollackISAACS, Aliviana Burdell   Critical care provider statement:    Critical care time (minutes):  35   Critical care time was exclusive of:  Separately billable procedures and treating other patients and teaching time   Critical care was necessary to treat or prevent imminent or life-threatening deterioration of the following conditions:  Endocrine crisis   Critical care was time spent personally by me on  the following activities:  Development of treatment plan with patient or surrogate, discussions with consultants, evaluation of patient's response to treatment, examination of patient, obtaining history from patient or surrogate, ordering and performing treatments and interventions, ordering and review of laboratory studies, ordering and review of radiographic studies, pulse oximetry, re-evaluation of patient's condition and review of old charts   I assumed direction of critical care for this patient from another provider in my specialty: no     (including critical care time)  Medications Ordered in ED Medications  dextrose 10 % infusion (not administered)  dextrose 50 % solution 50 mL (50 mLs Intravenous Given 02/20/17 0753)     Initial Impression / Assessment and Plan / ED Course  I have reviewed the triage vital signs and the nursing notes.  Pertinent labs & imaging results that were available during my care of the patient were reviewed by me and considered in my medical decision making (see chart for details).     56 yo M with h/o DM here with recurrent hypoglycemia. No pain. He is on Novolin in prison right now. On arrival, BG 54, repeat 32 even after eating [place] sandwich, drinking orange juice and d50 x 2. Suspect iatrogenic hypoglycemia, Possibly secondary to switching his insulin regimen while in prison. Patient was just seen yesterday for these symptoms. Given his persistent and worsening hypoglycemia despite eating complex carbohydrates as well as simple sugars, will admit for continued monitoring. Patient started on D10 drip. No apparent recent triggers. EKG nonischemic and troponin is negative. No recent infectious symptoms. He has a mild elevation in AST and ALT, which I suspect is secondary to his chronic alcohol dependence.  Final Clinical Impressions(s) / ED Diagnoses   Final diagnoses:  Hypoglycemia    New Prescriptions New Prescriptions   No medications on file       Shaune PollackIsaacs, Xerxes Agrusa, MD 02/20/17 [phone]

## 2017-02-20 NOTE — Consult Note (Signed)
[facility] Nurse wound consult note Reason for Consult: Healing I & D site to right posterior hip.  Early/partial granulation Wound type:Helaing inflammatory wound that was I & D's over a month ago.  Pressure Injury POA: NA Measurement: 1 cm x 0.5 cm x 0.1 cm nonintact with newly epithelialized scarring present circumferentially.   Wound WGN:FAOZbed:pink and moist Drainage (amount, consistency, odor) Scant serosanguinous  No odor.  Periwound:scarring Dressing procedure/placement/frequency:Cleanse wound to right lateral hip/buttock with NS and pat dry.  Apply silicone border foam.  Change every three days and PRN soilage.  Will not follow at this time.  Please re-consult if needed.  Maple HudsonKaren Bryce Cheever RN BSN [facility] Pager [phone]

## 2017-02-20 NOTE — H&P (Addendum)
History and Physical:    Raymond Winters   ZOX:[id number] DOB: 10/24/1960 DOA: 02/20/2017  Referring MD/provider: Dr Erma Heritage PCP: Jaclyn Shaggy, MD   [facility] from: Prison  Chief Complaint: Hypoglycemia  History of Present Illness:   Raymond Winters is an 56 y.o. male w pmh significant for poorly controlled IDDM, polysubstance abuse Is brought back in from [facility] with recurrent episodes of hypoglycemia. Of note patient was seen 24 hours ago with similar symptoms and treated symptomatically and discharged back to the jail however patient had another episode of profound hypoglycemia earlier in the jail again which did not resolve with eating and so was brought into the ED.  Patient himself states that he's been at the [facility] jail for about 30 days. He notes that he's been having hypoglycemia frequently about 4 times a week. He notes it can occur at anytime of the day although seems to be occurring in the early morning. He states he's been eating 3 meals a day per usual however that his insulin has been changed from his baseline Lantus 25 units at bedtime with every before meals regular insulin to what sounds like a 70/30 regimen although I'm unable to contact [facility] jail nurse to confirm.  Of note patient was noted to have very labile blood sugars on his last admission for cellulitis where it was stated that he was "very sensitive to short acting insulin" with resultant episodes of hypoglycemia while in house.  Patient denies any fevers or chills or any infection. Denies cough or shortness of breath. Denies any chest pain and dyspnea on exertion orthopnea or PND.  ED Course:  The patient was treated with D50 and with food however even while he was eating patient's blood sugar noted to drop down to 38. Patient was awake and alert but somewhat shaky during this episode.  ROS:   ROS 10 point review of systems otherwise negative unless as mentioned in history of present  illness.  Past Medical History:   Past Medical History:  Diagnosis Date  . Alcohol dependence (HCC) 01/05/2016  . Alcoholic liver disease, unspecified (HCC) 01/05/2016  . Arthritis    "lower extremeties, hands, shoulders, back" (08/20/2016)  . Chronic alcoholic pancreatitis (HCC) 01/05/2016  . Depression   . Family history of adverse reaction to anesthesia    pt's sister has hx. of post-op N/V  . Hepatitis C    "never treated" (08/20/2016)  . Heroin abuse   . High cholesterol   . Hypoalbuminemia due to protein-calorie malnutrition (HCC) 01/05/2016  . Insulin dependent diabetes mellitus (HCC)   . Lactic acid increased, CHRONIC 01/05/2016   Secondary to Liver Disease, Alcoholism, and Diabetes  . 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 Shoulder wound 01/2016   left    Past Surgical History:   Past Surgical History:  Procedure Laterality Date  . APPLICATION OF WOUND VAC Left 12/24/2015   Procedure: APPLICATION OF WOUND VAC; LEFT SHOULDER;  Surgeon: Tarry Kos, MD;  Location: [facility] OR;  Service: Orthopedics;  Laterality: Left;  . APPLICATION OF WOUND VAC Left 01/29/2016   Procedure: LEFT SHOULDER SPLIT THICKNESS SKIN GRAFT, WOUND VAC;  Surgeon: Tarry Kos, MD;  Location: [facility];  Service: Orthopedics;  Laterality: Left;  . I&D EXTREMITY Right 03/20/2013   Procedure: IRRIGATION AND DEBRIDEMENT EXTREMITY;  Surgeon: Marlowe Shores, MD;  Location: [facility] OR;  Service: Orthopedics;  Laterality: Right;  . I&D  EXTREMITY Left 12/24/2015   Procedure:  DEBRIDEMENT OF MUSCLE, SUBCUTANEOUS TISSUE LEFT SHOULDER;  Surgeon: Tarry Kos, MD;  Location: [facility] OR;  Service: Orthopedics;  Laterality: Left;  . I&D EXTREMITY Right 12/18/2016   Procedure: INCISION AND DRAINAGE ANKLE ABSCESS;  Surgeon: Sheral Apley, MD;  Location[facility] OR;  Service: Orthopedics;  Laterality: Right;  . INCISION AND DRAINAGE ABSCESS Right 08/19/2016   Procedure: DEBRIDEMENT RIGHT BUTTOCK   ABSCESS;  Surgeon: Manus Rudd, MD;  Location: [facility] OR;  Service: General;  Laterality: Right;  . IRRIGATION AND DEBRIDEMENT ABSCESS Right 08/19/2016   buttocks  . IRRIGATION AND DEBRIDEMENT SHOULDER Left 12/27/2015   Procedure: IRRIGATION AND DEBRIDEMENT LEFT SHOULDER;  Surgeon: Tarry Kos, MD;  Location: [facility] OR;  Service: Orthopedics;  Laterality: Left;  . IRRIGATION AND DEBRIDEMENT SHOULDER Left 12/30/2015   Procedure: IRRIGATION AND DEBRIDEMENT LEFT SHOULDER;  Surgeon: Tarry Kos, MD;  Location: [facility] OR;  Service: Orthopedics;  Laterality: Left;  . SKIN SPLIT GRAFT Left 01/29/2016   Procedure: SKIN GRAFT SPLIT THICKNESS,;  Surgeon: Tarry Kos, MD;  Location: [facility];  Service: Orthopedics;  Laterality: Left;    Social History:   Social History   Social History  . Marital status: Single    Spouse name: N/A  . Number of children: N/A  . Years of education: N/A   Occupational History  . Not on file.   Social History Main Topics  . Smoking status: Current Every Day Smoker    Packs/day: 0.50    Years: 37.00    Types: Cigarettes  . Smokeless tobacco: Never Used  . Alcohol use No     Comment: 08/20/2016 "stopped ~ 1 month ago"  . Drug use: Yes    Types: IV, Heroin     Comment: 3 weeks ago  . Sexual activity: Not Currently   Other Topics Concern  . Not on file   Social History Narrative  . No narrative on file    Allergies   Ativan [lorazepam]  Family history:   Family History  Problem Relation Age of Onset  . Diabetes Mother   . Heart disease Mother   . Hypertension Mother   . Stroke Mother   . Vision loss Mother   . Heart disease Father   . Hypertension Father   . Anesthesia problems Sister        post-op N/V    Current Medications:   Prior to Admission medications   Medication Sig Start Date End Date Taking? Authorizing Provider  aspirin EC 81 MG tablet Take 1 tablet (81 mg total) by mouth daily. 04/04/14  Yes Ambrose Finland, NP    atorvastatin (LIPITOR) 40 MG tablet Take 1 tablet (40 mg total) by mouth daily. 06/25/16  Yes Jaclyn Shaggy, MD  Blood Glucose Monitoring Suppl (ACCU-CHEK AVIVA) device Use as instructed 3 times before meals 09/10/16  Yes Amao, Odette Horns, MD  ferrous sulfate 325 (65 FE) MG tablet Take 1 tablet (325 mg total) by mouth 2 (two) times daily with a meal. 12/23/16  Yes Abrol, Germain Osgood, MD  gabapentin (NEURONTIN) 300 MG capsule TAKE 2 CAPSULE BY MOUTH AT BEDTIME FOR DIABETIC NERVE PAIN Patient taking differently: Take 600 mg by mouth at bedtime. FOR DIABETIC NERVE PAIN 12/11/16  Yes Jaclyn Shaggy, MD  glucose blood (ACCU-CHEK AVIVA) test strip Use as instructed 3 times before meals 08/26/16  Yes Amao, [name], MD  Insulin Pen Needle 31G X 5 MM MISC Use 3 times  daily before meals and at bedtime 12/08/16  Yes Amao, Odette Horns, MD  insulin regular (NOVOLIN R,HUMULIN R) 100 units/mL injection Inject 18 Units into the skin 3 (three) times daily before meals.   Yes [provider]  Lancet Devices [facility]) lancets Use as instructed 3 times before meals 08/26/16  Yes Amao, [name], MD  meloxicam (MOBIC) 7.5 MG tablet TAKE 1 TABLET(7.5 MG) BY MOUTH DAILY AS NEEDED FOR PAIN 02/02/17  Yes Jaclyn Shaggy, MD  mirtazapine (REMERON) 30 MG tablet TAKE 1 TABLET BY MOUTH AT BEDTIME 02/01/17  Yes Amao, Odette Horns, MD  Multiple Vitamins-Minerals (MULTIVITAMIN WITH MINERALS) tablet Take 1 tablet by mouth daily.   Yes [provider]  Pancrelipase, Lip-Prot-Amyl, (ZENPEP) 25000 units CPEP Take 25,000 Units by mouth 3 (three) times daily before meals. 11/03/16  Yes Jaclyn Shaggy, MD  polyethylene glycol (MIRALAX / GLYCOLAX) packet Take 17 g by mouth daily. 12/23/16  Yes Richarda Overlie, MD    Physical Exam:   Vitals:   02/20/17 0644 02/20/17 0654 02/20/17 1021 02/20/17 1044  BP:   132/88 137/83  Pulse:   88 84  Resp:   15 16  Temp:    98 F (36.7 C)  TempSrc:    Oral  SpO2: 100% 100% 100% 100%  Weight:    58.3 kg  (128 lb 8.5 oz)  Height:    5\' 7"  (1.702 m)     Physical Exam: Blood pressure 137/83, pulse 84, temperature 98 F (36.7 C), temperature source Oral, resp. rate 16, height 5\' 7"  (1.702 m), weight 58.3 kg (128 lb 8.5 oz), SpO2 100 %. Gen: Somewhat thin well-appearing gentleman sitting up in bed eating his breakfast with 2 guards at bedside. Eyes: Sclerae anicteric. Conjunctiva not injected Neck: Supple, no jugular venous distention. Chest: Moderately good air entry bilaterally with no adventitious sounds.  CV: Distant, regular, no audible murmurs. Abdomen: NABS, soft, nondistended, nontender. No tenderness to light or deep palpation. No rebound, no guarding. Extremities: No edema.  Skin: Warm and dry. No rashes, lesions or wounds. Neuro: Alert and oriented times 3; grossly nonfocal. Psych: Patient is cooperative, logical and coherent with appropriate mood and affect.  Data Review:    Labs: Basic Metabolic Panel:  Recent Labs Lab 02/20/17 0738  NA 142  K 3.8  CL 109  CO2 25  GLUCOSE 38*  BUN 14  CREATININE 0.93  CALCIUM 8.8*   Liver Function Tests:  Recent Labs Lab 02/20/17 0738  AST 129*  ALT 70*  ALKPHOS 81  BILITOT 0.5  PROT 6.9  ALBUMIN 3.2*   No results for input(s): LIPASE, AMYLASE in the last 168 hours. No results for input(s): AMMONIA in the last 168 hours. CBC:  Recent Labs Lab 02/20/17 0738  WBC 3.2*  HGB 10.6*  HCT 31.8*  MCV 80.1  PLT 154   Cardiac Enzymes: No results for input(s): CKTOTAL, CKMB, CKMBINDEX, TROPONINI in the last 168 hours.  BNP (last 3 results) No results for input(s): PROBNP in the last 8760 hours. CBG:  Recent Labs Lab 02/20/17 0656 02/20/17 0747 02/20/17 0828 02/20/17 1015 02/20/17 1118  GLUCAP 54* 32* 159* 234* 239*    Urinalysis    Component Value Date/Time   COLORURINE YELLOW 12/07/2016 1447   APPEARANCEUR HAZY (A) 12/07/2016 1447   LABSPEC 1.017 12/07/2016 1447   PHURINE 6.0 12/07/2016 1447    GLUCOSEU >=500 (A) 12/07/2016 1447   HGBUR SMALL (A) 12/07/2016 1447   BILIRUBINUR NEGATIVE 12/07/2016 1447   BILIRUBINUR neg  09/26/2015 1531   KETONESUR NEGATIVE 12/07/2016 1447   PROTEINUR 30 (A) 12/07/2016 1447   UROBILINOGEN 0.2 09/26/2015 1531   UROBILINOGEN 0.2 12/05/2013 0721   NITRITE NEGATIVE 12/07/2016 1447   LEUKOCYTESUR LARGE (A) 12/07/2016 1447      Radiographic Studies: Dg Chest 2 View  Result Date: 02/20/2017 CLINICAL DATA:  Recurrent hypoglycemia. Patient was found unresponsive, diaphoretic, and hypoxic 2 day. EXAM: CHEST  2 VIEW COMPARISON:  One-view chest x-ray 01/03/2016 FINDINGS: The heart size is normal. The lungs are hyperinflated. No focal airspace disease is present. There is no edema or effusion. The visualized soft tissues and bony thorax are unremarkable. IMPRESSION: Negative two view chest. Electronically Signed   By: Marin Robertshristopher  Mattern M.D.   On: 02/20/2017 08:27    Assessment/Plan:   Principal Problem:   Hypoglycemia associated with diabetes (HCC) Active Problems:   DM2 (diabetes mellitus, type 2) (HCC)   Hypoglycemia  HYPOGLYCEMIA IN DIABETES Patient with recurrent an almost daily hypoglycemia since [facility]. Patient has known IDDM for the past 25 years most likely secondary to recurrent alcoholic pancreatitis and pancreatic failure so he is likely entirely insulin dependent and likely has very few normal regulatory countermeasures to hypoglycemia.  Will admit patient to the hospital and allow him to eat. Check fingersticks every hour 4 and then every 2 hours 4. By this time the NPH and regular insulin he received this morning should be out of his system. Will give his usual Lantus 25 units at bedtime tonight. Will start low-dose sliding scale insulin coverage tomorrow morning.  [facility] They will need to find a way to procure this if this is not on their  formulary. Patient clearly is not tolerating 70/30 and clearly needs ultra long acting basal insulin.  PANCREATIC INSUFFICIENCY Continue pancrelipase.  HYPERLIPIDEMIA Continue atorvastatin  DIABETIC NEUROPATHY Continue gabapentin  DEPRESSION Continue mirtazapine.     Other information:   DVT prophylaxis: Not ordered as patient is easily ambulatory and should go home tomorrow. Code Status: Full code. Family Communication: No communication with family as [facility]  [facility] Consults called: None Admission status: Observation   The medical decision making on this patient was of high complexity and the patient is at high risk for clinical deterioration, therefore this is a level 3 visit.  The medical decision making is of moderate complexity, therefore this is a level 2 visit.  Horatio PelSrobona Orma Flamingublu Ilay Capshaw [facility] Pager [phone] Cell: [phone]   If 7PM-7AM, please contact night-coverage [email or web address] Password [email or web address] 02/20/2017, 12:58 PM

## 2017-02-21 DIAGNOSIS — E11649 Type 2 diabetes mellitus with hypoglycemia without coma: Secondary | ICD-10-CM | POA: Diagnosis not present

## 2017-02-21 LAB — COMPREHENSIVE METABOLIC PANEL
ALT: 60 U/L (ref 17–63)
AST: 97 U/L — ABNORMAL HIGH (ref 15–41)
Albumin: 2.9 g/dL — ABNORMAL LOW (ref 3.5–5.0)
Alkaline Phosphatase: 71 U/L (ref 38–126)
Anion gap: 6 (ref 5–15)
BUN: 16 mg/dL (ref 6–20)
CHLORIDE: 110 mmol/L (ref 101–111)
CO2: 29 mmol/L (ref 22–32)
CREATININE: 1.09 mg/dL (ref 0.61–1.24)
Calcium: 8.9 mg/dL (ref 8.9–10.3)
Glucose, Bld: 93 mg/dL (ref 65–99)
POTASSIUM: 4.5 mmol/L (ref 3.5–5.1)
SODIUM: 145 mmol/L (ref 135–145)
Total Bilirubin: 0.4 mg/dL (ref 0.3–1.2)
Total Protein: 6.4 g/dL — ABNORMAL LOW (ref 6.5–8.1)

## 2017-02-21 LAB — GLUCOSE, CAPILLARY
GLUCOSE-CAPILLARY: 105 mg/dL — AB (ref 65–99)
GLUCOSE-CAPILLARY: 89 mg/dL (ref 65–99)
GLUCOSE-CAPILLARY: 306 mg/dL — AB (ref 65–99)

## 2017-02-21 MED ORDER — INSULIN GLARGINE 100 UNIT/ML ~~LOC~~ SOLN
20.0000 [IU] | Freq: Every day | SUBCUTANEOUS | Status: DC
  Filled 2017-02-21: qty 0.2

## 2017-02-21 MED ORDER — INSULIN ASPART 100 UNIT/ML FLEXPEN: 0.0000 [IU] | PEN_INJECTOR | Freq: Three times a day (TID) | SUBCUTANEOUS | 0 refills | Status: DC

## 2017-02-21 MED ORDER — INSULIN GLARGINE 100 UNIT/ML SOLOSTAR PEN: 25.0000 [IU] | PEN_INJECTOR | Freq: Every day | SUBCUTANEOUS | 0 refills | Status: DC

## 2017-02-21 NOTE — Progress Notes (Signed)
Inpatient Diabetes Program Recommendations  AACE/ADA: New Consensus Statement on Inpatient Glycemic Control (2015)  Target Ranges:  Prepandial:   less than 140 mg/dL      Peak postprandial:   less than 180 mg/dL (1-2 hours)      Critically ill patients:  140 - 180 mg/dL   Lab Results  Component Value Date   GLUCAP 89 02/21/2017   HGBA1C 13.2 12/08/2016    Review of Glycemic Control Results for Raymond ButteryMIX, [place] (MRN [id number]) as of 02/21/2017 07:52  Ref. Range 02/20/2017 14:45 02/20/2017 16:40 02/20/2017 20:05 02/21/2017 04:51 02/21/2017 07:21  Glucose-Capillary Latest Ref Range: 65 - 99 mg/dL 782363 (H) 956314 (H) 213430 (H) 105 (H) 89   Diabetes history: DM Outpatient Diabetes medications: Basaglar 30 units qd + Novolog tid meal coverage Current orders for Inpatient glycemic control: Lantus 25 + Novolog sensitive tid  Inpatient Diabetes Program Recommendations:  Please consider: -Decrease Lantus to 20 units daily -Add Novolog 3 units tid meal coverage if eats 50% Will follow.  Thank you, Billy FischerJudy E. Jovi Zavadil, RN, MSN, CDE  Diabetes Coordinator Inpatient Glycemic Control Team Team Pager [phone] (8am-5pm) 02/21/2017 7:54 AM

## 2017-02-21 NOTE — Progress Notes (Addendum)
[facility] was asked to contact patient's sister to have her bring in Lantus and [employer] pens.  Sister Adele Dannsongya [phone]

## 2017-02-21 NOTE — Progress Notes (Signed)
RN [facility]

## 2017-02-21 NOTE — Discharge Summary (Signed)
[facility] Discharge Summary   Patient: Raymond Winters ZOX:[id number]   PCP: Jaclyn ShaggyAmao, [name], MD DOB: May 30, 1961   Date of admission: 02/20/2017   Date of discharge:  02/21/2017    Discharge Diagnoses:  Principal Problem:   Hypoglycemia associated with diabetes (HCC) Active Problems:   DM2 (diabetes mellitus, type 2) (HCC)   Hypoglycemia   Admitted From: [place]/ [facility] Disposition:  [facility]  Recommendations for Outpatient Follow-up:  1. Please follow up with PCP in 1 week with blood sugar log   Follow-up Information    Jaclyn ShaggyAmao, [name], MD. Schedule an appointment as soon as possible for a visit in 1 week(s).   Specialty:  Family Medicine Why:  with blood glucose log Contact information: [address] [place] [id number] [phone]          Diet recommendation: carb modified  Activity: The patient is advised to gradually reintroduce usual activities.  Discharge Condition: good  Code Status: full code  History of present illness: As per the H and P dictated on admission, "Raymond Winters is an 56 y.o. male w pmh significant for poorly controlled IDDM, polysubstance abuse [facility]. Of note patient was seen 24 hours ago with similar symptoms and treated symptomatically and discharged back to the jail however patient had another episode of profound hypoglycemia earlier in the jail again which did not resolve with eating and so was brought into the ED.  Patient himself [facility]. He notes that he's been having hypoglycemia frequently about 4 times a week. He notes it can occur at anytime of the day although seems to be occurring in the early morning. He states he's been eating 3 meals a day per usual however that his insulin has been changed from his baseline Lantus 25 units at bedtime with every before meals regular insulin  to what sounds like a 70/30 regimen although I'm [facility].  Of note patient was noted to have very labile blood sugars on his last admission for cellulitis where it was stated that he was "very sensitive to short acting insulin" with resultant episodes of hypoglycemia while in house.  Patient denies any fevers or chills or any infection. Denies cough or shortness of breath. Denies any chest pain and dyspnea on exertion orthopnea or PND."  Hospital Course:  Summary of his active problems in the hospital is as following.  HYPOGLYCEMIA IN DIABETES Patient with recurrent an almost daily hypoglycemia since [facility]. Patient has known IDDM for the past 25 years most likely secondary to recurrent alcoholic pancreatitis and pancreatic failure so he is likely entirely insulin dependent and likely has very few normal regulatory countermeasures to hypoglycemia.  Will give his usual Lantus 25 units at bedtime tonight. Patient will likely need to [facility]. They will need to find a way to procure this if this is not on their formulary. Patient clearly is not tolerating 70/30 and clearly needs ultra long acting basal insulin.  PANCREATIC INSUFFICIENCY Continue pancrelipase.  HYPERLIPIDEMIA Continue atorvastatin  DIABETIC NEUROPATHY Continue gabapentin  DEPRESSION Continue mirtazapine.   All other chronic medical condition were stable during the hospitalization.  Patient was ambulatory without any assistance. On the day of the discharge the patient's vitals were stable, and no other acute medical  condition were reported by patient. the patient was felt safe to be discharge at jail with poilice.  Procedures and Results:  none   Consultations:  none  DISCHARGE MEDICATION: Current Discharge Medication List    CONTINUE these medications which have CHANGED   Details  insulin  aspart (NOVOLOG FLEXPEN) 100 UNIT/ML FlexPen Inject 0-12 Units into the skin 3 (three) times daily with meals. 0-150 mg/dl give 0 units, 161- 096E give 2 units, 201-250 give 4 units, 251-300 give 6 units, 301-350 give 8 units, 351-400 give 10 units, greater than 400 give 12 units Qty: 15 mL, Refills: 0    Insulin Glargine (LANTUS SOLOSTAR) 100 UNIT/ML Solostar Pen Inject 25 Units into the skin daily at 10 pm. Qty: 15 mL, Refills: 0      CONTINUE these medications which have NOT CHANGED   Details  aspirin EC 81 MG tablet Take 1 tablet (81 mg total) by mouth daily. Qty: 30 tablet, Refills: 5   Associated Diagnoses: HLD (hyperlipidemia)    atorvastatin (LIPITOR) 40 MG tablet Take 1 tablet (40 mg total) by mouth daily. Qty: 30 tablet, Refills: 5   Associated Diagnoses: Type 2 diabetes mellitus without complication, with long-term current use of insulin (HCC)    Blood Glucose Monitoring Suppl (ACCU-CHEK AVIVA) device Use as instructed 3 times before meals Qty: 1 each, Refills: 0   Associated Diagnoses: Type 2 diabetes mellitus with complication, with long-term current use of insulin (HCC)    ferrous sulfate 325 (65 FE) MG tablet Take 1 tablet (325 mg total) by mouth 2 (two) times daily with a meal. Qty: 60 tablet, Refills: 3    gabapentin (NEURONTIN) 300 MG capsule TAKE 2 CAPSULE BY MOUTH AT BEDTIME FOR DIABETIC NERVE PAIN Qty: 120 capsule, Refills: 5   Associated Diagnoses: Diabetic polyneuropathy associated with type 2 diabetes mellitus (HCC)    glucose blood (ACCU-CHEK AVIVA) test strip Use as instructed 3 times before meals Qty: 100 each, Refills: 12   Associated Diagnoses: Type 2 diabetes mellitus with complication, with long-term current use of insulin (HCC)    Insulin Pen Needle 31G X 5 MM MISC Use 3 times daily before meals and at bedtime Qty: 120 each, Refills: 5    Lancet Devices (ACCU-CHEK SOFTCLIX) lancets Use as instructed 3 times before meals Qty: 1 each, Refills: 5    Associated Diagnoses: Type 2 diabetes mellitus with complication, with long-term current use of insulin (HCC)    meloxicam (MOBIC) 7.5 MG tablet TAKE 1 TABLET(7.5 MG) BY MOUTH DAILY AS NEEDED FOR PAIN Qty: 30 tablet, Refills: 0   Associated Diagnoses: Septic arthritis of right ankle, due to unspecified organism (HCC)    mirtazapine (REMERON) 30 MG tablet TAKE 1 TABLET BY MOUTH AT BEDTIME Qty: 30 tablet, Refills: 0    Multiple Vitamins-Minerals (MULTIVITAMIN WITH MINERALS) tablet Take 1 tablet by mouth daily.    Pancrelipase, Lip-Prot-Amyl, (ZENPEP) 25000 units CPEP Take 25,000 Units by mouth 3 (three) times daily before meals. Qty: 90 capsule, Refills: 3    polyethylene glycol (MIRALAX / GLYCOLAX) packet Take 17 g by mouth daily. Qty: 14 each, Refills: 0       Allergies  Allergen Reactions  . Ativan [Lorazepam] Other (See Comments)    HALLUCINATIONS   Discharge Instructions    Diet Carb Modified    Complete by:  As directed    Discharge instructions    Complete by:  As directed    It is important that you read following instructions as  well as go over your medication list with RN to help you understand your care after this hospitalization.  Discharge Instructions: Please follow-up with PCP in one week  Please request your primary care physician to go over all Hospital Tests and Procedure/Radiological results at the follow up,  Please get all Hospital records sent to your PCP by signing hospital release before you go home.   Do not take more than prescribed Pain, Sleep and Anxiety Medications. You were cared for by a hospitalist during your hospital stay. If you have any questions about your discharge medications or the care you received while you were in the hospital after you are discharged, you can call the unit and ask to speak with the hospitalist on call if the hospitalist that took care of you is not available.  Once you are discharged, your primary care physician will  handle any further medical issues. Please note that NO REFILLS for any discharge medications will be authorized once you are discharged, as it is imperative that you return to your primary care physician (or establish a relationship with a primary care physician if you do not have one) for your aftercare needs so that they can reassess your need for medications and monitor your lab values. You Must read complete instructions/literature along with all the possible adverse reactions/side effects for all the Medicines you take and that have been prescribed to you. Take any new Medicines after you have completely understood and accept all the possible adverse reactions/side effects. Wear Seat belts while driving. If you have smoked or chewed Tobacco in the last 2 yrs please stop smoking and/or stop any Recreational drug use.   Increase activity slowly    Complete by:  As directed      Discharge Exam: Filed Weights   02/20/17 1044 02/21/17 1325  Weight: 58.3 kg (128 lb 8.5 oz) 62.3 kg (137 lb 4.8 oz)   Vitals:   02/21/17 0454 02/21/17 1325  BP: 138/87 121/61  Pulse: 85 (!) 110  Resp: 15 16  Temp: 98.2 F (36.8 C) 98.1 F (36.7 C)  SpO2: 100% 100%   General: Appear in no distress, no Rash; Oral Mucosa moist. Cardiovascular: S1 and S2 Present, no Murmur, no JVD Respiratory: Bilateral Air entry present and Clear to Auscultation, no Crackles, no wheezes Abdomen: Bowel Sound present, Soft and no tenderness Extremities: no Pedal edema, no calf tenderness Neurology: Grossly no focal neuro deficit.  The results of significant diagnostics from this hospitalization (including imaging, microbiology, ancillary and laboratory) are listed below for reference.    Significant Diagnostic Studies: Dg Chest 2 View  Result Date: 02/20/2017 CLINICAL DATA:  Recurrent hypoglycemia. Patient was found unresponsive, diaphoretic, and hypoxic 2 day. EXAM: CHEST  2 VIEW COMPARISON:  One-view chest x-ray 01/03/2016  FINDINGS: The heart size is normal. The lungs are hyperinflated. No focal airspace disease is present. There is no edema or effusion. The visualized soft tissues and bony thorax are unremarkable. IMPRESSION: Negative two view chest. Electronically Signed   By: Marin Roberts M.D.   On: 02/20/2017 08:27    Microbiology: No results found for this or any previous visit (from the past 240 hour(s)).   Labs: CBC:  Recent Labs Lab 02/20/17 0738  WBC 3.2*  HGB 10.6*  HCT 31.8*  MCV 80.1  PLT 154   Basic Metabolic Panel:  Recent Labs Lab 02/20/17 0738 02/21/17 0542  NA 142 145  K 3.8 4.5  CL 109 110  CO2 25 29  GLUCOSE 38* 93  BUN 14 16  CREATININE 0.93 1.09  CALCIUM 8.8* 8.9   Liver Function Tests:  Recent Labs Lab 02/20/17 0738 02/21/17 0542  AST 129* 97*  ALT 70* 60  ALKPHOS 81 71  BILITOT 0.5 0.4  PROT 6.9 6.4*  ALBUMIN 3.2* 2.9*   CBG:  Recent Labs Lab 02/20/17 1640 02/20/17 2005 02/21/17 0451 02/21/17 0721 02/21/17 1114  GLUCAP 314* 430* 105* 89 306*   Time spent: 35 minutes  Signed:  Charnele Semple  [facility]  02/21/2017  , 1:33 PM

## 2017-02-21 NOTE — Care Management Note (Signed)
Case Management Note  Patient Details  Name: Raymond ButteryGary Winters MRN: [id number] Date of Birth: [birth year]  Subjective/Objective:   hypoglycemia                 Action/Plan: Discharge Planning: Spoke to pt and states his sister will pick up his Lantus and bring to jail. Sheriff states they do not carry Lantus. Pt has medications at home.   PCP Jaclyn ShaggyAMAO, [name] MD  Expected Discharge Date:  02/21/17               Expected Discharge Plan:  [facility]  In-House Referral:  NA  Discharge planning Services  CM Consult  Post Acute Care Choice:  NA Choice offered to:  NA  DME Arranged:  N/A DME Agency:  NA  HH Arranged:  NA HH Agency:  NA  Status of Service:  Completed, signed off  If discussed at Long Length of Stay Meetings, dates discussed:    Additional Comments:  Elliot CousinShavis, Recia Sons Ellen, RN 02/21/2017, 3:42 PM

## 2017-02-21 NOTE — Care Management Obs Status (Signed)
MEDICARE OBSERVATION STATUS NOTIFICATION   Patient Details  Name: Raymond Winters MRN: [id number] Date of Birth: 04-27-1961   Medicare Observation Status Notification Given:  Yes Delivered copy/police custody   Elliot CousinShavis, Mazie Fencl Ellen, RN 02/21/2017, 3:38 PM

## 2017-02-21 NOTE — Plan of Care (Signed)
[facility] If the patient is able to provide his home regimen medication, that can be approved by [facility] the [facility] and verified by the pharmacy patient can get Lantus at the jail. Informed the nurse as well as patient. Patient will contact his sister to get his Lantus from home which patient will carry to the jail and continue.

## 2017-02-23 MED FILL — NOVOLOG FLEXPEN SYRINGE: 100 | 33 days supply | Qty: 12 | Fill #1

## 2017-02-23 MED FILL — BASAGLAR 100 UNIT/ML KWIKPE: 100 | 34 days supply | Qty: 12 | Fill #1

## 2017-03-03 ENCOUNTER — Ambulatory Visit: Payer: Medicare Other | Admitting: Family Medicine

## 2017-03-09 ENCOUNTER — Other Ambulatory Visit: Payer: Self-pay | Admitting: Family Medicine

## 2017-03-09 DIAGNOSIS — M009 Pyogenic arthritis, unspecified: Secondary | ICD-10-CM

## 2017-03-16 ENCOUNTER — Other Ambulatory Visit: Payer: Self-pay | Admitting: Family Medicine

## 2017-03-29 ENCOUNTER — Other Ambulatory Visit: Payer: Self-pay | Admitting: Family Medicine

## 2017-04-06 ENCOUNTER — Other Ambulatory Visit: Payer: Self-pay | Admitting: Family Medicine

## 2017-04-06 DIAGNOSIS — M009 Pyogenic arthritis, unspecified: Secondary | ICD-10-CM

## 2017-04-13 ENCOUNTER — Other Ambulatory Visit: Payer: Self-pay

## 2017-04-13 MED ORDER — PANCRELIPASE (LIP-PROT-AMYL) 20000-63000 UNITS PO CPEP: 20000.0000 [IU] | ORAL_CAPSULE | Freq: Three times a day (TID) | ORAL | 0 refills | Status: DC

## 2017-05-06 ENCOUNTER — Ambulatory Visit (INDEPENDENT_AMBULATORY_CARE_PROVIDER_SITE_OTHER): Payer: Medicare Other | Admitting: Endocrinology

## 2017-05-06 ENCOUNTER — Encounter: Payer: Self-pay | Admitting: Endocrinology

## 2017-05-06 VITALS — BP 104/62 | HR 69 | Ht 67.0 in | Wt 115.0 lb

## 2017-05-06 DIAGNOSIS — Z794 Long term (current) use of insulin: Secondary | ICD-10-CM

## 2017-05-06 DIAGNOSIS — E43 Unspecified severe protein-calorie malnutrition: Secondary | ICD-10-CM

## 2017-05-06 DIAGNOSIS — E1165 Type 2 diabetes mellitus with hyperglycemia: Secondary | ICD-10-CM

## 2017-05-06 LAB — URINALYSIS, ROUTINE W REFLEX MICROSCOPIC
BILIRUBIN URINE: NEGATIVE
HGB URINE DIPSTICK: NEGATIVE
Leukocytes, UA: NEGATIVE
Nitrite: NEGATIVE
RBC / HPF: NONE SEEN (ref 0–?)
Specific Gravity, Urine: <=1.005 — AB (ref 1.000–1.030)
Total Protein, Urine: NEGATIVE
Urine Glucose: >=1000 — AB
Urobilinogen, UA: 0.2 (ref 0.0–1.0)
pH: 6 (ref 5.0–8.0)

## 2017-05-06 LAB — COMPREHENSIVE METABOLIC PANEL
ALT: 43 U/L (ref 0–53)
AST: 94 U/L — ABNORMAL HIGH (ref 0–37)
Albumin: 3.7 g/dL (ref 3.5–5.2)
Alkaline Phosphatase: 195 U/L — ABNORMAL HIGH (ref 39–117)
BUN: 15 mg/dL (ref 6–23)
CHLORIDE: 93 meq/L — AB (ref 96–112)
CO2: 26 meq/L (ref 19–32)
Calcium: 9.7 mg/dL (ref 8.4–10.5)
Creatinine, Ser: 1.4 mg/dL (ref 0.40–1.50)
GFR: 66.91 mL/min (ref >60.00)
GLUCOSE: 588 mg/dL — AB (ref 70–99)
POTASSIUM: 4.4 meq/L (ref 3.5–5.1)
SODIUM: 135 meq/L (ref 135–145)
TOTAL PROTEIN: 8.1 g/dL (ref 6.0–8.3)
Total Bilirubin: 0.9 mg/dL (ref 0.2–1.2)

## 2017-05-06 LAB — LIPID PANEL
CHOLESTEROL: 102 mg/dL (ref 0–200)
HDL: 22.9 mg/dL — AB (ref >39.00)
LDL Cholesterol: 48 mg/dL (ref 0–99)
NonHDL: 78.92
TRIGLYCERIDES: 153 mg/dL — AB (ref 0.0–149.0)
Total CHOL/HDL Ratio: 4
VLDL: 30.6 mg/dL (ref 0.0–40.0)

## 2017-05-06 LAB — GLUCOSE, POCT (MANUAL RESULT ENTRY): POC Glucose: 476 mg/dl — AB (ref 70–99)

## 2017-05-06 LAB — MICROALBUMIN / CREATININE URINE RATIO
CREATININE, U: 52.3 mg/dL
MICROALB/CREAT RATIO: 1.3 mg/g (ref 0.0–30.0)
Microalb, Ur: <0.7 mg/dL (ref 0.0–1.9)

## 2017-05-06 LAB — POCT GLYCOSYLATED HEMOGLOBIN (HGB A1C): HEMOGLOBIN A1C: 13.1

## 2017-05-06 NOTE — Progress Notes (Signed)
Patient ID: Raymond Winters, male   DOB: 02/01/1961, 56 y.o.   MRN: [id number]          Reason for Appointment: Consultation for Type 2 Diabetes  Referring physician: None   History of Present Illness:          Date of diagnosis of type 2 diabetes mellitus:  1994      Background history:   He thinks he was tried on metformin at the time of diagnosis.  He does not think his weight was excessive at the time of diagnosis He was probably started on insulin within a few years of his diagnosis because of poor control His blood sugar control has always been poor with A1c History indicating high levels except for twice when his A1c was below 8% He has been on various insulin regimens in the past Earlier this year when he was incarcerated he was taking 70/30 insulin and with this he was getting periodic hypoglycemia After his ER visit he was changed to Lantus and NovoLog  Recent history:   INSULIN regimen is:  15 Lantus hs     Current management, blood sugar patterns and problems identified:  He has not  been checking his blood sugar, has not had a glucose meter for probably a year  He was told to take NovoLog based on his blood sugar level but since he does not monitor he has not taken his NovoLog  Because of fear of hypoglycemia is taking only 15 units of Lantus at night instead of 25 that was recommended  He is not following any particular diet, may frequently fried food; he says his brother is cooking for him at home  In the morning sometimes he will have frosted flakes cereal  More recently has had weight loss despite good appetite  Does state thirsty a lot        Side effects from medications have been: None  Compliance with the medical regimen: Inadequate Hypoglycemia:   none recently  Glucose monitoring:  not done  Self-care: The diet that the patient has been following is: tries to limit sweets and sweet drinks .       Typical meal intake: Breakfast is late morning,  eggs/toast or cereal; dinner: fried chicken sometimes,  snacks at times              Dietician visit, most recent: never               Exercise:  minimal because of fatigue  Weight history:  Wt Readings from Last 3 Encounters:  05/06/17 115 lb (52.2 kg)  02/21/17 137 lb 4.8 oz (62.3 kg)  12/31/16 102 lb 12.8 oz (46.6 kg)    Glycemic control:   Lab Results  Component Value Date   HGBA1C 13.1 05/06/2017   HGBA1C 13.2 12/08/2016   HGBA1C 10.9 (H) 08/22/2016   Lab Results  Component Value Date   MICROALBUR 2.3 06/29/2016   LDLCALC 16 01/03/2014   CREATININE 1.09 02/21/2017   Lab Results  Component Value Date   MICRALBCREAT 21 06/29/2016    No results found for: FRUCTOSAMINE    Allergies as of 05/06/2017      Reactions   Ativan [lorazepam] Other (See Comments)   HALLUCINATIONS      Medication List        Accurate as of 05/06/17  2:48 PM. Always use your most recent med list.          ACCU-CHEK AVIVA device Use as  instructed 3 times before meals   accu-chek softclix lancets Use as instructed 3 times before meals   aspirin EC 81 MG tablet Take 1 tablet (81 mg total) by mouth daily.   atorvastatin 40 MG tablet Commonly known as:  LIPITOR Take 1 tablet (40 mg total) by mouth daily.   ferrous sulfate 325 (65 FE) MG tablet Take 1 tablet (325 mg total) by mouth 2 (two) times daily with a meal.   gabapentin 300 MG capsule Commonly known as:  NEURONTIN TAKE 2 CAPSULE BY MOUTH AT BEDTIME FOR DIABETIC NERVE PAIN   glucose blood test strip Commonly known as:  ACCU-CHEK AVIVA Use as instructed 3 times before meals   insulin aspart 100 UNIT/ML FlexPen Commonly known as:  NOVOLOG FLEXPEN Inject 0-12 Units into the skin 3 (three) times daily with meals. 0-150 mg/dl give 0 units, [id number] give 2 units, 201-250 give 4 units, 251-300 give 6 units, 301-350 give 8 units, 351-400 give 10 units, greater than 400 give 12 units   Insulin Glargine 100 UNIT/ML Solostar  Pen Commonly known as:  LANTUS SOLOSTAR Inject 25 Units into the skin daily at 10 pm.   Insulin Pen Needle 31G X 5 MM Misc Use 3 times daily before meals and at bedtime   meloxicam 7.5 MG tablet Commonly known as:  MOBIC TAKE 1 TABLET(7.5 MG) BY MOUTH DAILY AS NEEDED FOR PAIN   mirtazapine 30 MG tablet Commonly known as:  REMERON TAKE 1 TABLET BY MOUTH AT BEDTIME   multivitamin with minerals tablet Take 1 tablet by mouth daily.   Pancrelipase (Lip-Prot-Amyl) 25000 units Cpep Commonly known as:  ZENPEP Take 25,000 Units by mouth 3 (three) times daily before meals.   Pancrelipase (Lip-Prot-Amyl) 20000-63000 units Cpep Commonly known as:  ZENPEP Take 20,000 Units by mouth 3 (three) times daily.   polyethylene glycol packet Commonly known as:  MIRALAX / GLYCOLAX Take 17 g by mouth daily.       Allergies:  Allergies  Allergen Reactions  . Ativan [Lorazepam] Other (See Comments)    HALLUCINATIONS    Past Medical History:  Diagnosis Date  . Alcohol dependence (HCC) 01/05/2016  . Alcoholic liver disease, unspecified (HCC) 01/05/2016  . Arthritis    "lower extremeties, hands, shoulders, back" (08/20/2016)  . Chronic alcoholic pancreatitis (HCC) 01/05/2016  . Depression   . Family history of adverse reaction to anesthesia    pt's sister has hx. of post-op N/V  . Hepatitis C    "never treated" (08/20/2016)  . Heroin abuse (HCC)   . High cholesterol   . Hypoalbuminemia due to protein-calorie malnutrition (HCC) 01/05/2016  . Insulin dependent diabetes mellitus (HCC)   . Lactic acid increased, CHRONIC 01/05/2016   Secondary to Liver Disease, Alcoholism, and Diabetes  . 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 Shoulder wound 01/2016   left    Past Surgical History:  Procedure Laterality Date  . APPLICATION OF WOUND VAC Left 12/24/2015   Procedure: APPLICATION OF WOUND VAC; LEFT SHOULDER;  Surgeon: Tarry KosNaiping M Xu, MD;  Location: [facility] OR;  Service:  Orthopedics;  Laterality: Left;  . APPLICATION OF WOUND VAC Left 01/29/2016   Procedure: LEFT SHOULDER SPLIT THICKNESS SKIN GRAFT, WOUND VAC;  Surgeon: Tarry KosNaiping M Xu, MD;  Location: [facility];  Service: Orthopedics;  Laterality: Left;  . I&D EXTREMITY Right 03/20/2013   Procedure: IRRIGATION AND DEBRIDEMENT EXTREMITY;  Surgeon: Marlowe ShoresMatthew A Weingold, MD;  Location: [facility] OR;  Service: Orthopedics;  Laterality: Right;  . I&D EXTREMITY Left 12/24/2015   Procedure:  DEBRIDEMENT OF MUSCLE, SUBCUTANEOUS TISSUE LEFT SHOULDER;  Surgeon: Tarry KosNaiping M Xu, MD;  Location: [facility] OR;  Service: Orthopedics;  Laterality: Left;  . I&D EXTREMITY Right 12/18/2016   Procedure: INCISION AND DRAINAGE ANKLE ABSCESS;  Surgeon: Sheral ApleyMurphy, Timothy D, MD;  [facility];  Service: Orthopedics;  Laterality: Right;  . INCISION AND DRAINAGE ABSCESS Right 08/19/2016   Procedure: DEBRIDEMENT RIGHT BUTTOCK  ABSCESS;  Surgeon: Manus RuddMatthew Tsuei, MD;  [facility];  Service: General;  Laterality: Right;  . IRRIGATION AND DEBRIDEMENT ABSCESS Right 08/19/2016   buttocks  . IRRIGATION AND DEBRIDEMENT SHOULDER Left 12/27/2015   Procedure: IRRIGATION AND DEBRIDEMENT LEFT SHOULDER;  Surgeon: Tarry KosNaiping M Xu, MD;  Location: [facility];  Service: Orthopedics;  Laterality: Left;  . IRRIGATION AND DEBRIDEMENT SHOULDER Left 12/30/2015   Procedure: IRRIGATION AND DEBRIDEMENT LEFT SHOULDER;  Surgeon: Tarry KosNaiping M Xu, MD;  Location: [facility];  Service: Orthopedics;  Laterality: Left;  . SKIN SPLIT GRAFT Left 01/29/2016   Procedure: SKIN GRAFT SPLIT THICKNESS,;  Surgeon: Tarry KosNaiping M Xu, MD;  Location: [facility];  Service: Orthopedics;  Laterality: Left;    Family History  Problem Relation Age of Onset  . Diabetes Mother   . Heart disease Mother   . Hypertension Mother   . Stroke Mother   . Vision loss Mother   . Heart disease Father   . Hypertension Father   . Anesthesia problems Sister        post-op N/V  . Diabetes Sister     Social  History:  reports that he has been smoking cigarettes.  He has a 18.50 pack-year smoking history. he has never used smokeless tobacco. He reports that he uses drugs. Drugs: IV and Heroin. He reports that he does not drink alcohol.   Review of Systems  Constitutional: Positive for weight loss. Negative for reduced appetite.  HENT: Negative for trouble swallowing.   Eyes: Negative for blurred vision.  Respiratory: Negative for shortness of breath.   Cardiovascular: Negative for palpitations.  Gastrointestinal: Negative for abdominal pain.  Endocrine: Positive for fatigue and polydipsia. Negative for general weakness.  Genitourinary: Positive for frequency.  Musculoskeletal: Negative for joint pain.  Skin: Negative for itching.  Neurological: Positive for numbness and tingling.       He has mostly burning and stinging in his feet and not significant pains, taking gabapentin mostly at night     Lipid history: No history of hyperlipidemia    Lab Results  Component Value Date   CHOL 81 06/29/2016   HDL 38 (L) 06/29/2016   LDLCALC 16 01/03/2014   TRIG 101 06/29/2016   CHOLHDL 2.1 06/29/2016           Hypertension: None  Most recent eye exam was ?  5 years ago  Most recent foot exam: 11/18   Physical Examination:  BP 104/62   Pulse 69   Ht 5\' 7"  (1.702 m)   Wt 115 lb (52.2 kg)   BMI 18.01 kg/m   GENERAL:       he is asthenic-looking HEENT:         Eye exam shows normal external appearance. Fundus exam shows no retinopathy.  Oral exam shows normal mucosa .  NECK:   There is no lymphadenopathy Thyroid is not enlarged and no nodules felt.  Carotids are normal to palpation and no bruit heard LUNGS:  Chest is symmetrical. Lungs are clear to auscultation.Marland Kitchen   HEART:         Heart sounds:  S1 and S2 are normal. No murmur or click heard., no S3 or S4.   ABDOMEN:   There is no distention present. Liver and spleen are not palpable.  No other mass or tenderness present.    NEUROLOGICAL:   Ankle jerks are absent bilaterally.    Diabetic Foot Exam - Simple   Simple Foot Form Diabetic Foot exam was performed with the following findings:  Yes 05/06/2017  1:58 PM  Visual Inspection No deformities, no ulcerations, no other skin breakdown bilaterally:  Yes Sensation Testing See comments:  Yes Pulse Check See comments:  Yes Comments Absent monofilament sensation on the right great toe Absent dorsalis pedis pulses Left posterior tibialis 2/4             Vibration sense is nearly normal on the left and absent on the right great toe MUSCULOSKELETAL:  There is no swelling or deformity of the peripheral joints.   EXTREMITIES:     There is no edema. No skin lesions present.Marland Kitchen SKIN:       No rash or lesions of concern.        ASSESSMENT:  Diabetes type 2, nonobese and persistently uncontrolled He is insulin-dependent, maybe complicated by insulin deficiency related to chronic pancreatitis See history of present illness for detailed discussion of current diabetes management, blood sugar patterns and problems identified  Poor knowledge of diabetes management and self-care Currently has a poor diet with relatively high fat or high carbohydrate intake     Currently taking only basal insulin and blood sugars are totally out of control with A1c 13.1 today Nonfasting glucose 476 in the office  Complications of diabetes: Peripheral neuropathy, mild peripheral vascular disease, asymptomatic   History of chronic pancreatitis  Significant asthenia and malnutrition mostly related to poor control of diabetes  PLAN:     Start checking blood sugars with a One Touch Verio monitor that was given   Needs to check his sugars at least before meals and once postprandially  Discussed differences between basal and bolus insulins and actions of both of them  Most likely can do better with splitting the Lantus to 10 units twice a day instead of just 15 units once a day which  will give more consistent control over 24 hours  However this will need to be adjusted based on his blood sugar patterns especially fasting  Also indicated that he may need to reduce the dose by 2 units if he is having low normal or low sugars waking up  He needs comprehensive education of diabetes management with the CDE and dietitian and consultation requests have been made  Discussed need for mealtime insulin to cover all his meals and to take a minimum amount of insulin regardless of pre-meal blood sugar  He can start with 3-4 units for his meals of the NOVOLOG that he has, I emphasized the need to take this right before eating even when not at home  Also given him correction doses for blood sugars over 200  Discussed rotation of injection sites and not just in the central abdomen  Follow-up in 1 month  Strongly recommended influenza vaccine but he refuses  Needs annual eye exams, he will schedule now    Patient Instructions  BLOOD sugar testing:  Check blood sugar at least every other day on waking up and before supper time  Also  check blood sugars 2-3 hours after one of your meals daily  DIET:  Cut back on fried foods  Avoid eating high sugar cereal in the morning, try to get a protein like eggs or lean meat or cheese at breakfast  INSULIN:  Take 10 units of Lantus on waking up and bedtime If the morning sugars are below 80 week and reduce the dose to 8 units  NOVOLOG: Take at least 3-4 units before every meal regardless of blood sugar level  If the blood sugar before eating is over 200 add additional insulin as follows: 200-300 = 2 more units Over 300 = 4 more units  Please contact eye doctor for complete exam   Counseling time on subjects discussed in assessment and plan sections is over 50% of today's 60 minute visit  Consultation note has been sent to the referring physician  [facility] 05/06/2017, 2:48 PM   Note: This office note was prepared with  Dragon voice recognition system technology. Any transcriptional errors that result from this process are unintentional.

## 2017-05-06 NOTE — Patient Instructions (Addendum)
BLOOD sugar testing:  Check blood sugar at least every other day on waking up and before supper time  Also check blood sugars 2-3 hours after one of your meals daily  DIET:  Cut back on fried foods  Avoid eating high sugar cereal in the morning, try to get a protein like eggs or lean meat or cheese at breakfast  INSULIN:  Take 10 units of Lantus on waking up and bedtime If the morning sugars are below 80 week and reduce the dose to 8 units  NOVOLOG: Take at least 3-4 units before every meal regardless of blood sugar level  If the blood sugar before eating is over 200 add additional insulin as follows: 200-300 = 2 more units Over 300 = 4 more units  Please contact eye doctor for complete exam

## 2017-05-07 ENCOUNTER — Other Ambulatory Visit: Payer: Self-pay | Admitting: Family Medicine

## 2017-05-07 DIAGNOSIS — M009 Pyogenic arthritis, unspecified: Secondary | ICD-10-CM

## 2017-05-20 ENCOUNTER — Emergency Department (HOSPITAL_COMMUNITY): Payer: Medicare Other

## 2017-05-20 ENCOUNTER — Inpatient Hospital Stay (HOSPITAL_COMMUNITY)
Admission: EM | Admit: 2017-05-20 | Discharge: 2017-05-24 | DRG: 638 | Disposition: A | Discharge status: Home Health | Payer: Medicare Other | Attending: Internal Medicine | Admitting: Internal Medicine

## 2017-05-20 ENCOUNTER — Other Ambulatory Visit: Payer: Self-pay

## 2017-05-20 ENCOUNTER — Inpatient Hospital Stay (HOSPITAL_COMMUNITY): Payer: Medicare Other

## 2017-05-20 ENCOUNTER — Encounter (HOSPITAL_COMMUNITY): Payer: Self-pay | Admitting: Emergency Medicine

## 2017-05-20 DIAGNOSIS — E1010 Type 1 diabetes mellitus with ketoacidosis without coma: Secondary | ICD-10-CM | POA: Diagnosis not present

## 2017-05-20 DIAGNOSIS — Z794 Long term (current) use of insulin: Secondary | ICD-10-CM

## 2017-05-20 DIAGNOSIS — M1990 Unspecified osteoarthritis, unspecified site: Secondary | ICD-10-CM | POA: Diagnosis present

## 2017-05-20 DIAGNOSIS — B1920 Unspecified viral hepatitis C without hepatic coma: Secondary | ICD-10-CM | POA: Diagnosis present

## 2017-05-20 DIAGNOSIS — Z7982 Long term (current) use of aspirin: Secondary | ICD-10-CM | POA: Diagnosis not present

## 2017-05-20 DIAGNOSIS — R0602 Shortness of breath: Secondary | ICD-10-CM

## 2017-05-20 DIAGNOSIS — K860 Alcohol-induced chronic pancreatitis: Secondary | ICD-10-CM | POA: Diagnosis present

## 2017-05-20 DIAGNOSIS — Z79899 Other long term (current) drug therapy: Secondary | ICD-10-CM

## 2017-05-20 DIAGNOSIS — F1010 Alcohol abuse, uncomplicated: Secondary | ICD-10-CM | POA: Diagnosis present

## 2017-05-20 DIAGNOSIS — E1110 Type 2 diabetes mellitus with ketoacidosis without coma: Principal | ICD-10-CM

## 2017-05-20 DIAGNOSIS — E7800 Pure hypercholesterolemia, unspecified: Secondary | ICD-10-CM | POA: Diagnosis present

## 2017-05-20 DIAGNOSIS — Z9119 Patient's noncompliance with other medical treatment and regimen: Secondary | ICD-10-CM | POA: Diagnosis not present

## 2017-05-20 DIAGNOSIS — E1122 Type 2 diabetes mellitus with diabetic chronic kidney disease: Secondary | ICD-10-CM | POA: Diagnosis present

## 2017-05-20 DIAGNOSIS — Z8659 Personal history of other mental and behavioral disorders: Secondary | ICD-10-CM | POA: Diagnosis not present

## 2017-05-20 DIAGNOSIS — R531 Weakness: Secondary | ICD-10-CM | POA: Diagnosis not present

## 2017-05-20 DIAGNOSIS — E860 Dehydration: Secondary | ICD-10-CM | POA: Diagnosis present

## 2017-05-20 DIAGNOSIS — F1910 Other psychoactive substance abuse, uncomplicated: Secondary | ICD-10-CM | POA: Diagnosis present

## 2017-05-20 DIAGNOSIS — F17210 Nicotine dependence, cigarettes, uncomplicated: Secondary | ICD-10-CM | POA: Diagnosis present

## 2017-05-20 DIAGNOSIS — R079 Chest pain, unspecified: Secondary | ICD-10-CM | POA: Diagnosis not present

## 2017-05-20 DIAGNOSIS — K709 Alcoholic liver disease, unspecified: Secondary | ICD-10-CM | POA: Diagnosis present

## 2017-05-20 DIAGNOSIS — D649 Anemia, unspecified: Secondary | ICD-10-CM | POA: Diagnosis present

## 2017-05-20 DIAGNOSIS — R402413 Glasgow coma scale score 13-15, at hospital admission: Secondary | ICD-10-CM | POA: Diagnosis present

## 2017-05-20 DIAGNOSIS — N182 Chronic kidney disease, stage 2 (mild): Secondary | ICD-10-CM | POA: Diagnosis not present

## 2017-05-20 DIAGNOSIS — N179 Acute kidney failure, unspecified: Secondary | ICD-10-CM | POA: Diagnosis present

## 2017-05-20 DIAGNOSIS — T383X6A Underdosing of insulin and oral hypoglycemic [antidiabetic] drugs, initial encounter: Secondary | ICD-10-CM | POA: Diagnosis present

## 2017-05-20 DIAGNOSIS — Z720 Tobacco use: Secondary | ICD-10-CM | POA: Diagnosis present

## 2017-05-20 DIAGNOSIS — Z888 Allergy status to other drugs, medicaments and biological substances status: Secondary | ICD-10-CM

## 2017-05-20 DIAGNOSIS — R404 Transient alteration of awareness: Secondary | ICD-10-CM | POA: Diagnosis not present

## 2017-05-20 LAB — BASIC METABOLIC PANEL
ANION GAP: 27 — AB (ref 5–15)
ANION GAP: 17 — AB (ref 5–15)
Anion gap: 35 — ABNORMAL HIGH (ref 5–15)
BUN: 39 mg/dL — AB (ref 6–20)
BUN: 39 mg/dL — ABNORMAL HIGH (ref 6–20)
BUN: 34 mg/dL — AB (ref 6–20)
CALCIUM: 9.2 mg/dL (ref 8.9–10.3)
CALCIUM: 8.4 mg/dL — AB (ref 8.9–10.3)
CHLORIDE: 86 mmol/L — AB (ref 101–111)
CO2: 10 mmol/L — AB (ref 22–32)
CO2: 14 mmol/L — ABNORMAL LOW (ref 22–32)
CO2: 19 mmol/L — ABNORMAL LOW (ref 22–32)
CREATININE: 3.3 mg/dL — AB (ref 0.61–1.24)
Calcium: 9.1 mg/dL (ref 8.9–10.3)
Chloride: 98 mmol/L — ABNORMAL LOW (ref 101–111)
Chloride: 106 mmol/L (ref 101–111)
Creatinine, Ser: 2.98 mg/dL — ABNORMAL HIGH (ref 0.61–1.24)
Creatinine, Ser: 2.49 mg/dL — ABNORMAL HIGH (ref 0.61–1.24)
GFR calc Af Amer: 22 mL/min — ABNORMAL LOW (ref >60)
GFR calc Af Amer: 25 mL/min — ABNORMAL LOW (ref >60)
GFR calc Af Amer: 31 mL/min — ABNORMAL LOW (ref >60)
GFR calc non Af Amer: 19 mL/min — ABNORMAL LOW (ref >60)
GFR, EST NON AFRICAN AMERICAN: 22 mL/min — AB (ref >60)
GFR, EST NON AFRICAN AMERICAN: 27 mL/min — AB (ref >60)
GLUCOSE: 856 mg/dL — AB (ref 65–99)
Glucose, Bld: 322 mg/dL — ABNORMAL HIGH (ref 65–99)
Glucose, Bld: 182 mg/dL — ABNORMAL HIGH (ref 65–99)
POTASSIUM: 5.9 mmol/L — AB (ref 3.5–5.1)
POTASSIUM: 3.7 mmol/L (ref 3.5–5.1)
POTASSIUM: 3.4 mmol/L — AB (ref 3.5–5.1)
SODIUM: 131 mmol/L — AB (ref 135–145)
SODIUM: 139 mmol/L (ref 135–145)
SODIUM: 142 mmol/L (ref 135–145)

## 2017-05-20 LAB — MAGNESIUM: Magnesium: 2.3 mg/dL (ref 1.7–2.4)

## 2017-05-20 LAB — CBC WITH DIFFERENTIAL/PLATELET
Basophils Absolute: 0 10*3/uL (ref 0.0–0.1)
Basophils Relative: 0 %
EOS PCT: 0 %
Eosinophils Absolute: 0 10*3/uL (ref 0.0–0.7)
HCT: 43 % (ref 39.0–52.0)
Hemoglobin: 14.2 g/dL (ref 13.0–17.0)
LYMPHS ABS: 0.7 10*3/uL (ref 0.7–4.0)
LYMPHS PCT: 8 %
MCH: 26.7 pg (ref 26.0–34.0)
MCHC: 33 g/dL (ref 30.0–36.0)
MCV: 80.8 fL (ref 78.0–100.0)
MONOS PCT: 8 %
Monocytes Absolute: 0.7 10*3/uL (ref 0.1–1.0)
Neutro Abs: 7.5 10*3/uL (ref 1.7–7.7)
Neutrophils Relative %: 84 %
PLATELETS: 244 10*3/uL (ref 150–400)
RBC: 5.32 MIL/uL (ref 4.22–5.81)
RDW: 13.8 % (ref 11.5–15.5)
WBC: 8.9 10*3/uL (ref 4.0–10.5)

## 2017-05-20 LAB — RAPID URINE DRUG SCREEN, HOSP PERFORMED
Amphetamines: NOT DETECTED
Barbiturates: NOT DETECTED
Benzodiazepines: NOT DETECTED
Cocaine: NOT DETECTED
Opiates: POSITIVE — AB
Tetrahydrocannabinol: NOT DETECTED

## 2017-05-20 LAB — I-STAT VENOUS BLOOD GAS, ED
ACID-BASE DEFICIT: 18 mmol/L — AB (ref 0.0–2.0)
Bicarbonate: 10.4 mmol/L — ABNORMAL LOW (ref 20.0–28.0)
O2 SAT: 18 %
TCO2: 11 mmol/L — ABNORMAL LOW (ref 22–32)
pCO2, Ven: 32.4 mmHg — ABNORMAL LOW (ref 44.0–60.0)
pH, Ven: 7.113 — CL (ref 7.250–7.430)
pO2, Ven: 19 mmHg — CL (ref 32.0–45.0)

## 2017-05-20 LAB — I-STAT CG4 LACTIC ACID, ED
LACTIC ACID, VENOUS: 3.45 mmol/L — AB (ref 0.5–1.9)
Lactic Acid, Venous: 3.59 mmol/L (ref 0.5–1.9)

## 2017-05-20 LAB — URINALYSIS, ROUTINE W REFLEX MICROSCOPIC
BILIRUBIN URINE: NEGATIVE
Glucose, UA: >=500 mg/dL — AB
KETONES UR: 80 mg/dL — AB
Nitrite: NEGATIVE
Protein, ur: NEGATIVE mg/dL
SPECIFIC GRAVITY, URINE: 1.018 (ref 1.005–1.030)
pH: 5 (ref 5.0–8.0)

## 2017-05-20 LAB — I-STAT CHEM 8, ED
BUN: 36 mg/dL — AB (ref 6–20)
CALCIUM ION: 1.11 mmol/L — AB (ref 1.15–1.40)
CHLORIDE: 93 mmol/L — AB (ref 101–111)
CREATININE: 2.3 mg/dL — AB (ref 0.61–1.24)
HCT: 48 % (ref 39.0–52.0)
Hemoglobin: 16.3 g/dL (ref 13.0–17.0)
POTASSIUM: 5.7 mmol/L — AB (ref 3.5–5.1)
Sodium: 130 mmol/L — ABNORMAL LOW (ref 135–145)
TCO2: 11 mmol/L — ABNORMAL LOW (ref 22–32)

## 2017-05-20 LAB — HEPATIC FUNCTION PANEL
ALBUMIN: 3.5 g/dL (ref 3.5–5.0)
ALK PHOS: 193 U/L — AB (ref 38–126)
ALT: 45 U/L (ref 17–63)
AST: 49 U/L — AB (ref 15–41)
Bilirubin, Direct: 0.4 mg/dL (ref 0.1–0.5)
Indirect Bilirubin: 1.8 mg/dL — ABNORMAL HIGH (ref 0.3–0.9)
TOTAL PROTEIN: 8 g/dL (ref 6.5–8.1)
Total Bilirubin: 2.2 mg/dL — ABNORMAL HIGH (ref 0.3–1.2)

## 2017-05-20 LAB — OCCULT BLOOD GASTRIC / DUODENUM (SPECIMEN CUP): Occult Blood, Gastric: POSITIVE — AB

## 2017-05-20 LAB — CBG MONITORING, ED
GLUCOSE-CAPILLARY: 501 mg/dL — AB (ref 65–99)
GLUCOSE-CAPILLARY: 393 mg/dL — AB (ref 65–99)
GLUCOSE-CAPILLARY: 292 mg/dL — AB (ref 65–99)
Glucose-Capillary: >600 mg/dL (ref 65–99)
Glucose-Capillary: 275 mg/dL — ABNORMAL HIGH (ref 65–99)
Glucose-Capillary: 176 mg/dL — ABNORMAL HIGH (ref 65–99)
Glucose-Capillary: 164 mg/dL — ABNORMAL HIGH (ref 65–99)
Glucose-Capillary: 117 mg/dL — ABNORMAL HIGH (ref 65–99)

## 2017-05-20 LAB — ETHANOL

## 2017-05-20 MED ORDER — ZENPEP 25000 UNITS PO CPEP: 25000.0000 [IU] | ORAL_CAPSULE | Freq: Three times a day (TID) | ORAL | Status: DC

## 2017-05-20 MED ORDER — ASPIRIN EC 81 MG PO TBEC
81.0000 mg | DELAYED_RELEASE_TABLET | Freq: Every day | ORAL | Status: DC
  Administered 2017-05-21 – 2017-05-24 (×4): 81 mg via ORAL
  Filled 2017-05-20 (×4): qty 1

## 2017-05-20 MED ORDER — HYDRALAZINE HCL 20 MG/ML IJ SOLN: 10.0000 mg | Freq: Four times a day (QID) | INTRAMUSCULAR | Status: DC | PRN

## 2017-05-20 MED ORDER — DIAZEPAM 5 MG/ML IJ SOLN: 2.5000 mg | Freq: Four times a day (QID) | INTRAMUSCULAR | Status: DC | PRN

## 2017-05-20 MED ORDER — SODIUM CHLORIDE 0.9 % IV BOLUS (SEPSIS)
2000.0000 mL | Freq: Once | INTRAVENOUS | Status: AC
  Administered 2017-05-20: 2000 mL via INTRAVENOUS

## 2017-05-20 MED ORDER — SODIUM CHLORIDE 0.9 % IV SOLN: INTRAVENOUS | Status: DC

## 2017-05-20 MED ORDER — SODIUM CHLORIDE 0.9 % IV BOLUS (SEPSIS)
1000.0000 mL | Freq: Once | INTRAVENOUS | Status: AC
Start: 2017-05-20 — End: 2017-05-21
  Administered 2017-05-20: 1000 mL via INTRAVENOUS

## 2017-05-20 MED ORDER — ATORVASTATIN CALCIUM 40 MG PO TABS
40.0000 mg | ORAL_TABLET | Freq: Every day | ORAL | Status: DC
  Administered 2017-05-20 – 2017-05-24 (×5): 40 mg via ORAL
  Filled 2017-05-20 (×5): qty 1

## 2017-05-20 MED ORDER — HEPARIN SODIUM (PORCINE) 5000 UNIT/ML IJ SOLN: 5000.0000 [IU] | Freq: Three times a day (TID) | INTRAMUSCULAR | Status: DC

## 2017-05-20 MED ORDER — ONDANSETRON HCL 4 MG/2ML IJ SOLN
4.0000 mg | Freq: Four times a day (QID) | INTRAMUSCULAR | Status: DC | PRN
  Administered 2017-05-20 – 2017-05-21 (×3): 4 mg via INTRAVENOUS
  Filled 2017-05-20 (×3): qty 2

## 2017-05-20 MED ORDER — LACTATED RINGERS IV BOLUS (SEPSIS)
1000.0000 mL | Freq: Once | INTRAVENOUS | Status: AC
  Administered 2017-05-21: 1000 mL via INTRAVENOUS

## 2017-05-20 MED ORDER — DEXTROSE-NACL 5-0.45 % IV SOLN
INTRAVENOUS | Status: DC
  Administered 2017-05-21: via INTRAVENOUS

## 2017-05-20 MED ORDER — SODIUM CHLORIDE 0.9 % IV SOLN
INTRAVENOUS | Status: DC
  Administered 2017-05-20: 5.4 [IU]/h via INTRAVENOUS
  Filled 2017-05-20: qty 1

## 2017-05-20 MED ORDER — SODIUM CHLORIDE 0.9 % IV BOLUS (SEPSIS)
1000.0000 mL | Freq: Once | INTRAVENOUS | Status: AC
  Administered 2017-05-20: 1000 mL via INTRAVENOUS

## 2017-05-20 MED ORDER — POLYETHYLENE GLYCOL 3350 17 G PO PACK
17.0000 g | PACK | Freq: Every day | ORAL | Status: DC
  Administered 2017-05-21 – 2017-05-24 (×4): 17 g via ORAL
  Filled 2017-05-20 (×4): qty 1

## 2017-05-20 MED ORDER — GABAPENTIN 300 MG PO CAPS
600.0000 mg | ORAL_CAPSULE | Freq: Every day | ORAL | Status: DC
Start: 2017-05-20 — End: 2017-05-24
  Administered 2017-05-21 – 2017-05-23 (×3): 600 mg via ORAL
  Filled 2017-05-20 (×3): qty 2

## 2017-05-20 MED ORDER — PANCRELIPASE (LIP-PROT-AMYL) 12000-38000 UNITS PO CPEP
24000.0000 [IU] | ORAL_CAPSULE | Freq: Three times a day (TID) | ORAL | Status: DC
Start: 2017-05-20 — End: 2017-05-24
  Administered 2017-05-20 – 2017-05-24 (×11): 24000 [IU] via ORAL
  Filled 2017-05-20 (×12): qty 2

## 2017-05-20 MED ORDER — ADULT MULTIVITAMIN W/MINERALS CH
1.0000 | ORAL_TABLET | Freq: Every day | ORAL | Status: DC
  Administered 2017-05-20 – 2017-05-24 (×5): 1 via ORAL
  Filled 2017-05-20 (×5): qty 1

## 2017-05-20 MED ORDER — MIRTAZAPINE 30 MG PO TABS
30.0000 mg | ORAL_TABLET | Freq: Every day | ORAL | Status: DC
  Administered 2017-05-21 – 2017-05-23 (×3): 30 mg via ORAL
  Filled 2017-05-20 (×5): qty 1

## 2017-05-20 MED ORDER — DEXTROSE-NACL 5-0.45 % IV SOLN
INTRAVENOUS | Status: DC
  Administered 2017-05-20: 21:00:00 via INTRAVENOUS

## 2017-05-20 MED ORDER — FOLIC ACID 1 MG PO TABS
1.0000 mg | ORAL_TABLET | Freq: Every day | ORAL | Status: DC
  Administered 2017-05-21 – 2017-05-24 (×4): 1 mg via ORAL
  Filled 2017-05-20 (×4): qty 1

## 2017-05-20 MED ORDER — SODIUM CHLORIDE 0.9 % IV SOLN
INTRAVENOUS | Status: DC
  Administered 2017-05-21: 20:00:00 via INTRAVENOUS

## 2017-05-20 MED ORDER — PANTOPRAZOLE SODIUM 40 MG IV SOLR
40.0000 mg | Freq: Two times a day (BID) | INTRAVENOUS | Status: DC
  Administered 2017-05-20 – 2017-05-22 (×5): 40 mg via INTRAVENOUS
  Filled 2017-05-20 (×5): qty 40

## 2017-05-20 MED ORDER — FERROUS SULFATE 325 (65 FE) MG PO TABS
325.0000 mg | ORAL_TABLET | Freq: Two times a day (BID) | ORAL | Status: DC
  Administered 2017-05-20 – 2017-05-24 (×8): 325 mg via ORAL
  Filled 2017-05-20 (×9): qty 1

## 2017-05-20 MED ORDER — SODIUM CHLORIDE 0.9 % IV SOLN
INTRAVENOUS | Status: DC
  Administered 2017-05-21: 0.5 [IU]/h via INTRAVENOUS
  Administered 2017-05-21: 3.7 [IU]/h via INTRAVENOUS
  Administered 2017-05-21: 0.8 [IU]/h via INTRAVENOUS
  Administered 2017-05-21 (×2): 1 [IU]/h via INTRAVENOUS
  Administered 2017-05-21: 2.9 [IU]/h via INTRAVENOUS

## 2017-05-20 MED ORDER — METOCLOPRAMIDE HCL 5 MG/ML IJ SOLN
10.0000 mg | Freq: Once | INTRAMUSCULAR | Status: AC
  Administered 2017-05-20: 10 mg via INTRAVENOUS
  Filled 2017-05-20: qty 2

## 2017-05-20 MED ORDER — VITAMIN B-1 100 MG PO TABS
100.0000 mg | ORAL_TABLET | Freq: Every day | ORAL | Status: DC
  Administered 2017-05-21 – 2017-05-24 (×4): 100 mg via ORAL
  Filled 2017-05-20 (×4): qty 1

## 2017-05-20 NOTE — ED Notes (Addendum)
Attempted to collect UA. Pt unable to provide at this time. Urinal at bedside 

## 2017-05-20 NOTE — ED Notes (Signed)
Attempted both oral and axillary temp. Unable to register

## 2017-05-20 NOTE — ED Notes (Signed)
CBG >600 °

## 2017-05-20 NOTE — H&P (Signed)
Patient Demographics:    Raymond Winters, is a 56 y.o. male  MRN: [id number]   DOB - [birth year]  Admit Date - 05/20/2017  Outpatient Primary MD for the patient is Jaclyn ShaggyAmao, [name], MD   Assessment & Plan:    Principal Problem:   DKA (diabetic ketoacidoses) (HCC) Active Problems:   Tobacco abuse   Chronic kidney disease (CKD), stage II (mild)   Polysubstance abuse (HCC)   1)DKA-in the noncompliant patient, anion gap is 35, corrected sodium is around 140, potassium is 5.9, serum glucose initially was 856, Venous PH is 7.1, aggressive IV hydration with saline solution, IV insulin drip per DKA protocol, adjust IV fluids type and rate based on glucose levels and electrolytes.  BMP every 4 hours, point-of-care glucose per protocol.   2)Nausea/Vomiting-Gastroccult positive, no gross bleeding, no BM in almost a week, suspect nausea/vomiting secondary to DKA, IV Protonix as ordered, initial hemoglobin over 16, suspect this is due to hemoconcentration in the setting of DKA/dehydration, baseline hemoglobin usually around 10.  Check serial H&H, consider GI w/up if significant drop in H&H compared to baseline  3)Alcohol and Polysubstance Abuse-urine drug screen pending, Valium per [facility]WA protocol, Thiamine and Folic acid as ordered,  4)FEN- NPO for now due to nausea vomiting the setting of DKA, history of recurrent pancreatitis, sodium and potassium levels as noted in #1 above, adjust IV fluids type and rate based on glucose levels and electrolytes.  Check serum magnesium in this alcoholic male  5)AKI- creatinine on admission 3.3, baseline just above 1, suspect this is due to DKA with significant dehydration, should improve with hydration.  Avoid Nephrotoxic agents  With History of - Reviewed by me  Past Medical History:  Diagnosis  Date  . Alcohol dependence (HCC) 01/05/2016  . Alcoholic liver disease, unspecified (HCC) 01/05/2016  . Arthritis    "lower extremeties, hands, shoulders, back" (08/20/2016)  . Chronic alcoholic pancreatitis (HCC) 01/05/2016  . Depression   . Family history of adverse reaction to anesthesia    pt's sister has hx. of post-op N/V  . Hepatitis C    "never treated" (08/20/2016)  . Heroin abuse (HCC)   . High cholesterol   . Hypoalbuminemia due to protein-calorie malnutrition (HCC) 01/05/2016  . Insulin dependent diabetes mellitus (HCC)   . Lactic acid increased, CHRONIC 01/05/2016   Secondary to Liver Disease, Alcoholism, and Diabetes  . 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 Shoulder wound 01/2016   left      Past Surgical History:  Procedure Laterality Date  . APPLICATION OF WOUND VAC Left 12/24/2015   Procedure: APPLICATION OF WOUND VAC; LEFT SHOULDER;  Surgeon: Tarry KosNaiping M Xu, MD;  Location: [facility] OR;  Service: Orthopedics;  Laterality: Left;  . APPLICATION OF WOUND VAC Left 01/29/2016   Procedure: LEFT SHOULDER SPLIT THICKNESS SKIN GRAFT, WOUND VAC;  Surgeon: Tarry KosNaiping M Xu, MD;  Location: [facility];  Service: Orthopedics;  Laterality: Left;  . I&D EXTREMITY Right 03/20/2013   Procedure: IRRIGATION AND DEBRIDEMENT EXTREMITY;  Surgeon: Marlowe ShoresMatthew A Weingold, MD;  Location: [facility] OR;  Service: Orthopedics;  Laterality: Right;  . I&D EXTREMITY Left 12/24/2015   Procedure:  DEBRIDEMENT OF MUSCLE, SUBCUTANEOUS TISSUE LEFT SHOULDER;  Surgeon: Tarry KosNaiping M Xu, MD;  Location: [facility] OR;  Service: Orthopedics;  Laterality: Left;  . I&D EXTREMITY Right 12/18/2016   Procedure: INCISION AND DRAINAGE ANKLE ABSCESS;  Surgeon: Sheral ApleyMurphy, Timothy D, MD;  [facility];  Service: Orthopedics;  Laterality: Right;  . INCISION AND DRAINAGE ABSCESS Right 08/19/2016   Procedure: DEBRIDEMENT RIGHT BUTTOCK  ABSCESS;  Surgeon: Manus RuddMatthew Tsuei, MD;  [facility];  Service: General;  Laterality: Right;    . IRRIGATION AND DEBRIDEMENT ABSCESS Right 08/19/2016   buttocks  . IRRIGATION AND DEBRIDEMENT SHOULDER Left 12/27/2015   Procedure: IRRIGATION AND DEBRIDEMENT LEFT SHOULDER;  Surgeon: Tarry KosNaiping M Xu, MD;  Location: [facility];  Service: Orthopedics;  Laterality: Left;  . IRRIGATION AND DEBRIDEMENT SHOULDER Left 12/30/2015   Procedure: IRRIGATION AND DEBRIDEMENT LEFT SHOULDER;  Surgeon: Tarry KosNaiping M Xu, MD;  Location: [facility];  Service: Orthopedics;  Laterality: Left;  . SKIN SPLIT GRAFT Left 01/29/2016   Procedure: SKIN GRAFT SPLIT THICKNESS,;  Surgeon: Tarry KosNaiping M Xu, MD;  Location: [facility];  Service: Orthopedics;  Laterality: Left;      Chief Complaint  Patient presents with  . Hyperglycemia      HPI:    Raymond Winters  is a 56 y.o. male with past medical history relevant for polysubstance abuse, alcohol abuse, requiring temperature septic arthritis and uncontrolled diabetes with noncompliance who presents with persistent nausea vomiting, abdominal discomfort, generalized malaise and weakness.    Emesis is without bile or blood however it is Gastroccult positive, no bowel movement in almost a week  No fever or chills, No chest pains palpitations or dizziness.  In ED.... Patient complains of being hungry and wants to be fed  Serum glucose is 856, with a bicarb of 10, potassium is 5.9, corrected sodium is around 140, PH on VBG is 7.1  Patient was started on IV insulin and IV fluids per DKA protocol in the ED  Patient states he has not drank alcohol or use drugs in > 1 week but I am not sure whether to believe him or not, patient admits he has not taken his insulin in over 2 weeks.      Review of systems:    In addition to the HPI above,   A full 12 point Review of 10 Systems was done, except as stated above, all other Review of 10 Systems were negative.    Social History:  Reviewed by me    Social History   Tobacco Use  . Smoking status: Current Every Day Smoker     Packs/day: 0.50    Years: 37.00    Pack years: 18.50    Types: Cigarettes  . Smokeless tobacco: Never Used  Substance Use Topics  . Alcohol use: No    Alcohol/week: 0.0 oz    Comment: 08/20/2016 "stopped ~ 1 month ago"     Family History :  Reviewed by me    Family History  Problem Relation Age of Onset  . Diabetes Mother   . Heart disease Mother   . Hypertension Mother   . Stroke Mother   . Vision loss Mother   . Heart disease Father   . Hypertension Father   .  Anesthesia problems Sister        post-op N/V  . Diabetes Sister      Home Medications:   Prior to Admission medications   Medication Sig Start Date End Date Taking? Authorizing Provider  aspirin EC 81 MG tablet Take 1 tablet (81 mg total) by mouth daily. 04/04/14  Yes Ambrose Finland, NP  atorvastatin (LIPITOR) 40 MG tablet Take 1 tablet (40 mg total) by mouth daily. 06/25/16  Yes Jaclyn Shaggy, MD  Blood Glucose Monitoring Suppl (ACCU-CHEK AVIVA) device Use as instructed 3 times before meals 09/10/16  Yes Amao, Odette Horns, MD  ferrous sulfate 325 (65 FE) MG tablet Take 1 tablet (325 mg total) by mouth 2 (two) times daily with a meal. 12/23/16  Yes Abrol, Germain Osgood, MD  gabapentin (NEURONTIN) 300 MG capsule TAKE 2 CAPSULE BY MOUTH AT BEDTIME FOR DIABETIC NERVE PAIN Patient taking differently: Take 600 mg by mouth at bedtime. FOR DIABETIC NERVE PAIN 12/11/16  Yes Jaclyn Shaggy, MD  glucose blood (ACCU-CHEK AVIVA) test strip Use as instructed 3 times before meals 08/26/16  Yes Amao, [name], MD  insulin aspart (NOVOLOG FLEXPEN) 100 UNIT/ML FlexPen Inject 0-12 Units into the skin 3 (three) times daily with meals. 0-150 mg/dl give 0 units, 956- 213Y give 2 units, 201-250 give 4 units, 251-300 give 6 units, 301-350 give 8 units, 351-400 give 10 units, greater than 400 give 12 units 02/21/17  Yes Rolly Salter, MD  Insulin Glargine (LANTUS SOLOSTAR) 100 UNIT/ML Solostar Pen Inject 25 Units into the skin daily at 10 pm. 02/21/17  Yes Rolly Salter, MD  Insulin Pen Needle 31G X 5 MM MISC Use 3 times daily before meals and at bedtime 12/08/16  Yes Jaclyn Shaggy, MD  Lancet Devices [facility]) lancets Use as instructed 3 times before meals 08/26/16  Yes Amao, [name], MD  meloxicam (MOBIC) 7.5 MG tablet TAKE 1 TABLET(7.5 MG) BY MOUTH DAILY AS NEEDED FOR PAIN 05/11/17  Yes Jaclyn Shaggy, MD  mirtazapine (REMERON) 30 MG tablet TAKE 1 TABLET BY MOUTH AT BEDTIME 03/17/17  Yes Jaclyn Shaggy, MD  Multiple Vitamins-Minerals (MULTIVITAMIN WITH MINERALS) tablet Take 1 tablet by mouth daily.   Yes [provider]  Pancrelipase, Lip-Prot-Amyl, (ZENPEP) 25000 units CPEP Take 25,000 Units by mouth 3 (three) times daily before meals. 11/03/16  Yes Jaclyn Shaggy, MD  polyethylene glycol (MIRALAX / GLYCOLAX) packet Take 17 g by mouth daily. 12/23/16  Yes Richarda Overlie, MD     Allergies:     Allergies  Allergen Reactions  . Ativan [Lorazepam] Other (See Comments)    HALLUCINATIONS     Physical Exam:   Vitals  Blood pressure (!) 141/98, pulse (!) 125, resp. rate 15, height 5\' 7"  (1.702 m), weight 44.9 kg (99 lb), SpO2 96 %.  Physical Examination: General appearance - alert, cachectic and in no distress  Mental status - alert, oriented to person, place, and time Eyes - sclera anicteric Neck - supple, no JVD elevation , Chest - clear  to auscultation bilaterally, symmetrical air movement,  Heart - S1 and S2 normal,  Abdomen - soft, non-tender (No epigastric tenderness), nondistended, no CVA tenderness Neurological - screening mental status exam normal, neck supple without rigidity, cranial nerves II through XII intact, DTR's normal and symmetric, no tremors Extremities - no pedal edema noted, intact peripheral pulses  Skin - warm, dry Psych-appropriate affect   Data Review:    CBC Recent Labs  Lab 05/20/17 1211 05/20/17 1227  WBC  8.9  --   HGB 14.2 16.3  HCT 43.0 48.0  PLT 244  --   MCV 80.8  --   MCH 26.7  --     MCHC 33.0  --   RDW 13.8  --   LYMPHSABS 0.7  --   MONOABS 0.7  --   EOSABS 0.0  --   BASOSABS 0.0  --    ------------------------------------------------------------------------------------------------------------------  Chemistries  Recent Labs  Lab 05/20/17 1211 05/20/17 1227  NA 131* 130*  K 5.9* 5.7*  CL 86* 93*  CO2 10*  --   GLUCOSE 856* >700*  BUN 39* 36*  CREATININE 3.30* 2.30*  CALCIUM 9.1  --   AST 49*  --   ALT 45  --   ALKPHOS 193*  --   BILITOT 2.2*  --    ------------------------------------------------------------------------------------------------------------------ estimated creatinine clearance is 22.2 mL/min (A) (by C-G formula based on SCr of 2.3 mg/dL (H)). ------------------------------------------------------------------------------------------------------------------ No results for input(s): TSH, T4TOTAL, T3FREE, THYROIDAB in the last 72 hours.  Invalid input(s): FREET3   Coagulation profile No results for input(s): INR, PROTIME in the last 168 hours. ------------------------------------------------------------------------------------------------------------------- No results for input(s): DDIMER in the last 72 hours. -------------------------------------------------------------------------------------------------------------------  Cardiac Enzymes No results for input(s): CKMB, TROPONINI, MYOGLOBIN in the last 168 hours.  Invalid input(s): CK ------------------------------------------------------------------------------------------------------------------ No results found for: BNP   ---------------------------------------------------------------------------------------------------------------  Urinalysis    Component Value Date/Time   COLORURINE YELLOW 05/20/2017 1138   APPEARANCEUR HAZY (A) 05/20/2017 1138   LABSPEC 1.018 05/20/2017 1138   PHURINE 5.0 05/20/2017 1138   GLUCOSEU >=500 (A) 05/20/2017 1138   GLUCOSEU >=1000 (A)  05/06/2017 1359   HGBUR MODERATE (A) 05/20/2017 1138   BILIRUBINUR NEGATIVE 05/20/2017 1138   BILIRUBINUR neg 09/26/2015 1531   KETONESUR 80 (A) 05/20/2017 1138   PROTEINUR NEGATIVE 05/20/2017 1138   UROBILINOGEN 0.2 05/06/2017 1359   NITRITE NEGATIVE 05/20/2017 1138   LEUKOCYTESUR SMALL (A) 05/20/2017 1138    ----------------------------------------------------------------------------------------------------------------   Imaging Results:    Dg Chest Port 1 View  Result Date: 05/20/2017 CLINICAL DATA:  Shortness of breath 2 weeks. Nausea and vomiting today. EXAM: PORTABLE CHEST 1 VIEW COMPARISON:  Two-view chest x-ray 02/20/2017. FINDINGS: The heart size and mediastinal contours are within normal limits. Both lungs are clear. The visualized skeletal structures are unremarkable. IMPRESSION: Negative two view chest x-ray Electronically Signed   By: Marin Roberts M.D.   On: 05/20/2017 12:49    Radiological Exams on Admission: Dg Chest Port 1 View  Result Date: 05/20/2017 CLINICAL DATA:  Shortness of breath 2 weeks. Nausea and vomiting today. EXAM: PORTABLE CHEST 1 VIEW COMPARISON:  Two-view chest x-ray 02/20/2017. FINDINGS: The heart size and mediastinal contours are within normal limits. Both lungs are clear. The visualized skeletal structures are unremarkable. IMPRESSION: Negative two view chest x-ray Electronically Signed   By: Marin Roberts M.D.   On: 05/20/2017 12:49    DVT Prophylaxis -SCD  (gastrocult +ve) AM Labs Ordered, also please review Full Orders  Family Communication: Admission, patients condition and plan of care including tests being ordered have been discussed with the patient  who indicate understanding and agree with the plan   Code Status - Full Code  Likely DC to  home  Condition   fair  Shon Hale M.D on 05/20/2017 at 3:05 PM   Between 7am to 7pm - Pager - [phone] After 7pm go to [email or web address] - password [facility]  [facility] - Office  [phone]  Voice Recognition /  Dragon dictation system was used to create this note, attempts have been made to correct errors. Please contact the author with questions and/or clarifications.

## 2017-05-20 NOTE — ED Notes (Signed)
[name] [phone]

## 2017-05-20 NOTE — ED Notes (Signed)
I stat lactic reported to Dr. Particia NearingHaviland by B. Bing PlumeHaynes, EMT

## 2017-05-20 NOTE — ED Provider Notes (Signed)
[facility] EMERGENCY DEPARTMENT Provider Note  CSN: [id number] Arrival date & time: 05/20/17 1056  Chief Complaint(s) Hyperglycemia  HPI Raymond Winters is a 56 y.o. male with an extensive past medical history including alcohol abuse, polysubstance abuse, hep C, insulin-dependent diabetes who presents to the emergency department with 2 weeks of daily nausea and vomiting.  He also endorses poor p.o. tolerance.  Patient and family report that he has been having worsening shortness of breath with ambulation.  Denies any diarrhea.  No chest pain.  No abdominal pain.  No urinary symptoms.  Patient endorses noncompliance with his insulin due inability to tolerate oral intake.  He denies any recent fevers or infections.  Last drug use was 3 weeks ago per the patient.    HPI  Past Medical History Past Medical History:  Diagnosis Date  . Alcohol dependence (HCC) 01/05/2016  . Alcoholic liver disease, unspecified (HCC) 01/05/2016  . Arthritis    "lower extremeties, hands, shoulders, back" (08/20/2016)  . Chronic alcoholic pancreatitis (HCC) 01/05/2016  . Depression   . Family history of adverse reaction to anesthesia    pt's sister has hx. of post-op N/V  . Hepatitis C    "never treated" (08/20/2016)  . Heroin abuse (HCC)   . High cholesterol   . Hypoalbuminemia due to protein-calorie malnutrition (HCC) 01/05/2016  . Insulin dependent diabetes mellitus (HCC)   . Lactic acid increased, CHRONIC 01/05/2016   Secondary to Liver Disease, Alcoholism, and Diabetes  . 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 Shoulder wound 01/2016   left   Patient Active Problem List   Diagnosis Date Noted  . Hypoglycemia associated with diabetes (HCC) 02/20/2017  . Ankle pain, right   . Ankle cellulitis 12/15/2016  . Cellulitis of foot, right 12/14/2016  . Cellulitis of right ankle 12/14/2016  . Chronic kidney disease (CKD), stage II (mild) 12/14/2016  . Non compliance w  medication regimen 12/08/2016  . Necrotizing fasciitis (HCC) 08/19/2016  . Hypoglycemia 06/22/2016  . Depression 02/03/2016  . Chronic hepatitis C (HCC)   . Alcoholic liver disease, unspecified (HCC) 01/05/2016  . Alcohol dependence (HCC) 01/05/2016  . Chronic alcoholic pancreatitis (HCC) 01/05/2016  . Hypoalbuminemia due to protein-calorie malnutrition (HCC) 01/05/2016  . Lactic acid increased, CHRONIC 01/05/2016  . Diarrhea 01/04/2016  . Chronic diarrhea 01/04/2016  . Pressure ulcer 01/04/2016  . Uncontrolled type 2 diabetes mellitus with complication, with long-term current use of insulin (HCC)   . Swelling   . Type 2 diabetes mellitus with complication, with long-term current use of insulin (HCC)   . Septic joint of left shoulder region (HCC)   . Abscess 12/24/2015  . Abscess of left shoulder   . Leukocytosis   . Type 2 diabetes mellitus with complication, without long-term current use of insulin (HCC)   . Glenohumeral arthritis 07/24/2015  . Left shoulder pain 07/24/2015  . 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 Tobacco abuse 07/26/2014  . Protein-calorie malnutrition, severe (HCC) 12/05/2013  . Hyperkalemia 12/04/2013  . Heroin withdrawal (HCC) 12/04/2013  . Alcohol abuse 12/04/2013  . Hyperglycemia 12/04/2013  . Metabolic acidosis with respiratory alkalosis 12/04/2013  . Heroin abuse (HCC) 04/04/2013  . DM2 (diabetes mellitus, type 2) (HCC) 03/20/2013   Home Medication(s) Prior to Admission medications   Medication Sig Start Date End Date Taking? Authorizing Provider  aspirin EC 81 MG tablet Take 1 tablet (81 mg total) by mouth daily. 04/04/14   Ambrose Finland, NP  atorvastatin (LIPITOR) 40 MG tablet Take 1 tablet (40 mg total) by mouth daily. 06/25/16   Jaclyn ShaggyAmao, [name], MD  Blood Glucose Monitoring Suppl (ACCU-CHEK AVIVA) device Use as instructed 3 times before meals 09/10/16   Jaclyn ShaggyAmao, [name], MD  ferrous sulfate 325 (65 FE) MG tablet Take 1 tablet (325 mg total) by mouth 2  (two) times daily with a meal. 12/23/16   Richarda OverlieAbrol, Nayana, MD  gabapentin (NEURONTIN) 300 MG capsule TAKE 2 CAPSULE BY MOUTH AT BEDTIME FOR DIABETIC NERVE PAIN Patient taking differently: Take 600 mg by mouth at bedtime. FOR DIABETIC NERVE PAIN 12/11/16   Jaclyn ShaggyAmao, [name], MD  glucose blood (ACCU-CHEK AVIVA) test strip Use as instructed 3 times before meals 08/26/16   Jaclyn ShaggyAmao, [name], MD  insulin aspart (NOVOLOG FLEXPEN) 100 UNIT/ML FlexPen Inject 0-12 Units into the skin 3 (three) times daily with meals. 0-150 mg/dl give 0 units, [id number] give 2 units, 201-250 give 4 units, 251-300 give 6 units, 301-350 give 8 units, 351-400 give 10 units, greater than 400 give 12 units 02/21/17   Rolly SalterPatel, Pranav M, MD  Insulin Glargine (LANTUS SOLOSTAR) 100 UNIT/ML Solostar Pen Inject 25 Units into the skin daily at 10 pm. 02/21/17   Rolly SalterPatel, Pranav M, MD  Insulin Pen Needle 31G X 5 MM MISC Use 3 times daily before meals and at bedtime 12/08/16   Jaclyn ShaggyAmao, [name], MD  Lancet Devices [facility](ACCU-CHEK SOFTCLIX) lancets Use as instructed 3 times before meals 08/26/16   Jaclyn ShaggyAmao, [name], MD  meloxicam (MOBIC) 7.5 MG tablet TAKE 1 TABLET(7.5 MG) BY MOUTH DAILY AS NEEDED FOR PAIN 05/11/17   Jaclyn ShaggyAmao, [name], MD  mirtazapine (REMERON) 30 MG tablet TAKE 1 TABLET BY MOUTH AT BEDTIME 03/17/17   Jaclyn ShaggyAmao, [name], MD  Multiple Vitamins-Minerals (MULTIVITAMIN WITH MINERALS) tablet Take 1 tablet by mouth daily.    [provider]  Pancrelipase, Lip-Prot-Amyl, (ZENPEP) 20000-63000 units CPEP Take 20,000 Units by mouth 3 (three) times daily. 04/13/17   Jaclyn ShaggyAmao, [name], MD  Pancrelipase, Lip-Prot-Amyl, (ZENPEP) 25000 units CPEP Take 25,000 Units by mouth 3 (three) times daily before meals. 11/03/16   Jaclyn ShaggyAmao, [name], MD  polyethylene glycol (MIRALAX / GLYCOLAX) packet Take 17 g by mouth daily. 12/23/16   Richarda OverlieAbrol, Nayana, MD                                                                                                                                    Past Surgical  History Past Surgical History:  Procedure Laterality Date  . APPLICATION OF WOUND VAC Left 12/24/2015   Procedure: APPLICATION OF WOUND VAC; LEFT SHOULDER;  Surgeon: Tarry KosNaiping M Xu, MD;  Location: [facility] OR;  Service: Orthopedics;  Laterality: Left;  . APPLICATION OF WOUND VAC Left 01/29/2016   Procedure: LEFT SHOULDER SPLIT THICKNESS SKIN GRAFT, WOUND VAC;  Surgeon: Tarry KosNaiping M Xu, MD;  Location: [facility];  Service: Orthopedics;  Laterality: Left;  . I&D EXTREMITY Right 03/20/2013  Procedure: IRRIGATION AND DEBRIDEMENT EXTREMITY;  Surgeon: Marlowe ShoresMatthew A Weingold, MD;  Location: [facility] OR;  Service: Orthopedics;  Laterality: Right;  . I&D EXTREMITY Left 12/24/2015   Procedure:  DEBRIDEMENT OF MUSCLE, SUBCUTANEOUS TISSUE LEFT SHOULDER;  Surgeon: Tarry KosNaiping M Xu, MD;  Location: [facility] OR;  Service: Orthopedics;  Laterality: Left;  . I&D EXTREMITY Right 12/18/2016   Procedure: INCISION AND DRAINAGE ANKLE ABSCESS;  Surgeon: Sheral ApleyMurphy, Timothy D, MD;  [facility];  Service: Orthopedics;  Laterality: Right;  . INCISION AND DRAINAGE ABSCESS Right 08/19/2016   Procedure: DEBRIDEMENT RIGHT BUTTOCK  ABSCESS;  Surgeon: Manus RuddMatthew Tsuei, MD;  [facility];  Service: General;  Laterality: Right;  . IRRIGATION AND DEBRIDEMENT ABSCESS Right 08/19/2016   buttocks  . IRRIGATION AND DEBRIDEMENT SHOULDER Left 12/27/2015   Procedure: IRRIGATION AND DEBRIDEMENT LEFT SHOULDER;  Surgeon: Tarry KosNaiping M Xu, MD;  Location: [facility];  Service: Orthopedics;  Laterality: Left;  . IRRIGATION AND DEBRIDEMENT SHOULDER Left 12/30/2015   Procedure: IRRIGATION AND DEBRIDEMENT LEFT SHOULDER;  Surgeon: Tarry KosNaiping M Xu, MD;  Location: [facility];  Service: Orthopedics;  Laterality: Left;  . SKIN SPLIT GRAFT Left 01/29/2016   Procedure: SKIN GRAFT SPLIT THICKNESS,;  Surgeon: Tarry KosNaiping M Xu, MD;  Location: [facility];  Service: Orthopedics;  Laterality: Left;   Family History Family History  Problem Relation Age of Onset  . Diabetes Mother   .  Heart disease Mother   . Hypertension Mother   . Stroke Mother   . Vision loss Mother   . Heart disease Father   . Hypertension Father   . Anesthesia problems Sister        post-op N/V  . Diabetes Sister     Social History Social History   Tobacco Use  . Smoking status: Current Every Day Smoker    Packs/day: 0.50    Years: 37.00    Pack years: 18.50    Types: Cigarettes  . Smokeless tobacco: Never Used  Substance Use Topics  . Alcohol use: No    Alcohol/week: 0.0 oz    Comment: 08/20/2016 "stopped ~ 1 month ago"  . Drug use: Yes    Types: IV, Heroin    Comment: 3 weeks ago   Allergies Ativan [lorazepam]  Review of Systems Review of Systems All other systems are reviewed and are negative for acute change except as noted in the HPI  Physical Exam Vital Signs  I have reviewed the triage vital signs BP 123/87   Pulse (!) 108   Resp 13   Ht 5\' 7"  (1.702 m)   Wt 44.9 kg (99 lb)   SpO2 100%   BMI 15.51 kg/m   Physical Exam  Constitutional: He is oriented to person, place, and time. He appears well-developed. He appears cachectic. He has a sickly appearance. No distress.  HENT:  Head: Normocephalic and atraumatic.  Nose: Nose normal.  Eyes: Conjunctivae and EOM are normal. Pupils are equal, round, and reactive to light. Right eye exhibits no discharge. Left eye exhibits no discharge. No scleral icterus.  Neck: Normal range of motion. Neck supple.  Cardiovascular: Normal rate and regular rhythm. Exam reveals no gallop and no friction rub.  No murmur heard. Pulmonary/Chest: Effort normal and breath sounds normal. No stridor. No respiratory distress. He has no rales.  Abdominal: Soft. He exhibits no distension. There is no tenderness.  Musculoskeletal: He exhibits no edema or tenderness.  Neurological: He is alert and oriented to person, place, and time.  Skin: Skin  is warm and dry. No rash noted. He is not diaphoretic. No erythema.  Psychiatric: He has a normal mood  and affect.  Vitals reviewed.   ED Results and Treatments Labs (all labs ordered are listed, but only abnormal results are displayed) Labs Reviewed  BASIC METABOLIC PANEL - Abnormal; Notable for the following components:      Result Value   Sodium 131 (*)    Potassium 5.9 (*)    Chloride 86 (*)    CO2 10 (*)    Glucose, Bld 856 (*)    BUN 39 (*)    Creatinine, Ser 3.30 (*)    GFR calc non Af Amer 19 (*)    GFR calc Af Amer 22 (*)    Anion gap 35 (*)    All other components within normal limits  I-STAT CHEM 8, ED - Abnormal; Notable for the following components:   Sodium 130 (*)    Potassium 5.7 (*)    Chloride 93 (*)    BUN 36 (*)    Creatinine, Ser 2.30 (*)    Glucose, Bld >700 (*)    Calcium, Ion 1.11 (*)    TCO2 11 (*)    All other components within normal limits  I-STAT CG4 LACTIC ACID, ED - Abnormal; Notable for the following components:   Lactic Acid, Venous 3.59 (*)    All other components within normal limits  I-STAT VENOUS BLOOD GAS, ED - Abnormal; Notable for the following components:   pH, Ven 7.113 (*)    pCO2, Ven 32.4 (*)    pO2, Ven 19.0 (*)    Bicarbonate 10.4 (*)    TCO2 11 (*)    Acid-base deficit 18.0 (*)    All other components within normal limits  CBC WITH DIFFERENTIAL/PLATELET  ETHANOL  URINALYSIS, ROUTINE W REFLEX MICROSCOPIC  RAPID URINE DRUG SCREEN, HOSP PERFORMED  HEPATIC FUNCTION PANEL  OCCULT BLOOD GASTRIC / DUODENUM (SPECIMEN CUP)                                                                                                                         EKG  EKG Interpretation  Date/Time:  Thursday May 20 2017 11:37:37 EST Ventricular Rate:  122 PR Interval:    QRS Duration: 142 QT Interval:  335 QTC Calculation: 478 R Axis:   0 Text Interpretation:  Sinus tachycardia Ventricular premature complex Consider right atrial enlargement Nonspecific intraventricular conduction delay Baseline wander in lead(s) I II aVR aVL aVF Otherwise  no significant change Confirmed by Drema Pry [phone]) on 05/20/2017 11:57:24 AM      Radiology Dg Chest Port 1 View  Result Date: 05/20/2017 CLINICAL DATA:  Shortness of breath 2 weeks. Nausea and vomiting today. EXAM: PORTABLE CHEST 1 VIEW COMPARISON:  Two-view chest x-ray 02/20/2017. FINDINGS: The heart size and mediastinal contours are within normal limits. Both lungs are clear. The visualized skeletal structures are unremarkable. IMPRESSION: Negative two view chest x-ray Electronically Signed   By:  Marin Roberts M.D.   On: 05/20/2017 12:49   Pertinent labs & imaging results that were available during my care of the patient were reviewed by me and considered in my medical decision making (see chart for details).  Medications Ordered in ED Medications  sodium chloride 0.9 % bolus 1,000 mL (1,000 mLs Intravenous New Bag/Given 05/20/17 1229)    And  0.9 %  sodium chloride infusion (not administered)  insulin regular (NOVOLIN R,HUMULIN R) 100 Units in sodium chloride 0.9 % 100 mL (1 Units/mL) infusion (not administered)  dextrose 5 %-0.45 % sodium chloride infusion (not administered)                                                                                                                                    Procedures Procedures CRITICAL CARE Performed by: Amadeo Garnet Delona Clasby Total critical care time: 35 minutes Critical care time was exclusive of separately billable procedures and treating other patients. Critical care was necessary to treat or prevent imminent or life-threatening deterioration. Critical care was time spent personally by me on the following activities: development of treatment plan with patient and/or surrogate as well as nursing, discussions with consultants, evaluation of patient's response to treatment, examination of patient, obtaining history from patient or surrogate, ordering and performing treatments and interventions, ordering and review of  laboratory studies, ordering and review of radiographic studies, pulse oximetry and re-evaluation of patient's condition.   (including critical care time)  Medical Decision Making / ED Course I have reviewed the nursing notes for this encounter and the patient's prior records (if available in EHR or on provided paperwork).    Workup consistent with diabetic ketoacidosis with anion gap of 35.  EKG without acute ischemic changes.  Chest x-ray without evidence of pneumonia or pulmonary edema.  No other infectious process identified.  Patient started on IV fluids and insulin drip.  Potassium at 5.7.  Will require admission for further management.  Final Clinical Impression(s) / ED Diagnoses Final diagnoses:  SOB (shortness of breath)  Diabetic ketoacidosis without coma associated with type 2 diabetes mellitus (HCC)      This chart was dictated using voice recognition software.  Despite best efforts to proofread,  errors can occur which can change the documentation meaning.   Nira Conn, MD 05/20/17 [phone]

## 2017-05-20 NOTE — ED Triage Notes (Signed)
[facility]- pt coming from home c/o hyperglycemia. Meter reading high. Pt has had n/v. Has been unable to take his diabetic meds. Short of breath with exertion. Vitals with EMS, 155/77, HR 111, RR 16 93% room air. CBG high.

## 2017-05-20 NOTE — ED Notes (Signed)
Report given to Huntley DecSara on 5W

## 2017-05-20 NOTE — ED Notes (Signed)
Pt unable to tolerate PO meds at this time due to c/o of nausea and episode of vomiting per Selena BattenKim, RN

## 2017-05-20 NOTE — ED Provider Notes (Signed)
CENTRAL LINE Performed by: Shanon AceNate Dulcey Riederer Consent: The procedure was performed in an emergent situation. Required items: required blood products, implants, devices, and special equipment available Patient identity confirmed: arm band and provided demographic data Time out: Immediately prior to procedure a "time out" was called to verify the correct patient, procedure, equipment, support staff and site/side marked as required. Indications: vascular access Anesthesia: local infiltration Local anesthetic: lidocaine 1% with epinephrine Anesthetic total: 3 ml Patient sedated: no Preparation: skin prepped with 2% chlorhexidine Skin prep agent dried: skin prep agent completely dried prior to procedure Sterile barriers: all five maximum sterile barriers used - cap, mask, sterile gown, sterile gloves, and large sterile sheet Hand hygiene: hand hygiene performed prior to central venous catheter insertion  Location details: Right internal jugular  Catheter type: triple lumen Catheter size: 8 Fr Pre-procedure: landmarks identified Ultrasound guidance: yes Successful placement: yes Post-procedure: line sutured and dressing applied Assessment: blood return through all parts, free fluid flow, placement verified by x-ray and no pneumothorax on x-ray Patient tolerance: Patient tolerated the procedure well with no immediate complications.     Con Arganbright Italyhad, MD 05/20/17 1616    Jacalyn LefevreHaviland, Julie, MD 05/20/17 [phone]

## 2017-05-20 NOTE — ED Notes (Signed)
Critical Lab Glucose 856 Dr. Eudelia Bunchardama made aware

## 2017-05-20 NOTE — ED Notes (Signed)
Dr. Particia NearingHaviland at bedside attempting central line at this time

## 2017-05-21 ENCOUNTER — Other Ambulatory Visit: Payer: Self-pay

## 2017-05-21 DIAGNOSIS — E1010 Type 1 diabetes mellitus with ketoacidosis without coma: Secondary | ICD-10-CM

## 2017-05-21 DIAGNOSIS — F1910 Other psychoactive substance abuse, uncomplicated: Secondary | ICD-10-CM

## 2017-05-21 DIAGNOSIS — E1110 Type 2 diabetes mellitus with ketoacidosis without coma: Principal | ICD-10-CM

## 2017-05-21 DIAGNOSIS — N182 Chronic kidney disease, stage 2 (mild): Secondary | ICD-10-CM

## 2017-05-21 LAB — BASIC METABOLIC PANEL
ANION GAP: 5 (ref 5–15)
Anion gap: 9 (ref 5–15)
BUN: 27 mg/dL — ABNORMAL HIGH (ref 6–20)
BUN: 31 mg/dL — AB (ref 6–20)
CALCIUM: 8 mg/dL — AB (ref 8.9–10.3)
CHLORIDE: 108 mmol/L (ref 101–111)
CHLORIDE: 108 mmol/L (ref 101–111)
CO2: 24 mmol/L (ref 22–32)
CO2: 25 mmol/L (ref 22–32)
CREATININE: 2 mg/dL — AB (ref 0.61–1.24)
CREATININE: 1.78 mg/dL — AB (ref 0.61–1.24)
Calcium: 8.2 mg/dL — ABNORMAL LOW (ref 8.9–10.3)
GFR calc non Af Amer: 35 mL/min — ABNORMAL LOW (ref >60)
GFR calc non Af Amer: 40 mL/min — ABNORMAL LOW (ref >60)
GFR, EST AFRICAN AMERICAN: 41 mL/min — AB (ref >60)
GFR, EST AFRICAN AMERICAN: 47 mL/min — AB (ref >60)
GLUCOSE: 143 mg/dL — AB (ref 65–99)
Glucose, Bld: 85 mg/dL (ref 65–99)
Potassium: 3.1 mmol/L — ABNORMAL LOW (ref 3.5–5.1)
Potassium: 3.5 mmol/L (ref 3.5–5.1)
SODIUM: 138 mmol/L (ref 135–145)
Sodium: 141 mmol/L (ref 135–145)

## 2017-05-21 LAB — GLUCOSE, CAPILLARY
GLUCOSE-CAPILLARY: 107 mg/dL — AB (ref 65–99)
GLUCOSE-CAPILLARY: 142 mg/dL — AB (ref 65–99)
GLUCOSE-CAPILLARY: 156 mg/dL — AB (ref 65–99)
GLUCOSE-CAPILLARY: 160 mg/dL — AB (ref 65–99)
GLUCOSE-CAPILLARY: 179 mg/dL — AB (ref 65–99)
GLUCOSE-CAPILLARY: 205 mg/dL — AB (ref 65–99)
GLUCOSE-CAPILLARY: 90 mg/dL (ref 65–99)
Glucose-Capillary: 183 mg/dL — ABNORMAL HIGH (ref 65–99)
Glucose-Capillary: 111 mg/dL — ABNORMAL HIGH (ref 65–99)
Glucose-Capillary: 175 mg/dL — ABNORMAL HIGH (ref 65–99)

## 2017-05-21 LAB — CBC
HCT: 29.1 % — ABNORMAL LOW (ref 39.0–52.0)
HEMOGLOBIN: 9.8 g/dL — AB (ref 13.0–17.0)
MCH: 26 pg (ref 26.0–34.0)
MCHC: 33.7 g/dL (ref 30.0–36.0)
MCV: 77.2 fL — AB (ref 78.0–100.0)
PLATELETS: 165 10*3/uL (ref 150–400)
RBC: 3.77 MIL/uL — AB (ref 4.22–5.81)
RDW: 13.3 % (ref 11.5–15.5)
WBC: 9.9 10*3/uL (ref 4.0–10.5)

## 2017-05-21 LAB — HEMOGLOBIN AND HEMATOCRIT, BLOOD
HEMATOCRIT: 24.7 % — AB (ref 39.0–52.0)
HEMOGLOBIN: 8.6 g/dL — AB (ref 13.0–17.0)

## 2017-05-21 MED ORDER — INSULIN ASPART 100 UNIT/ML ~~LOC~~ SOLN
0.0000 [IU] | Freq: Three times a day (TID) | SUBCUTANEOUS | Status: DC
  Administered 2017-05-21 (×2): 2 [IU] via SUBCUTANEOUS
  Administered 2017-05-22 (×3): 1 [IU] via SUBCUTANEOUS
  Administered 2017-05-23 (×2): 3 [IU] via SUBCUTANEOUS
  Administered 2017-05-23: 1 [IU] via SUBCUTANEOUS
  Administered 2017-05-24: 7 [IU] via SUBCUTANEOUS

## 2017-05-21 MED ORDER — INSULIN GLARGINE 100 UNIT/ML ~~LOC~~ SOLN
30.0000 [IU] | Freq: Every day | SUBCUTANEOUS | Status: DC
Start: 2017-05-21 — End: 2017-05-21

## 2017-05-21 MED ORDER — INSULIN GLARGINE 100 UNIT/ML ~~LOC~~ SOLN
12.0000 [IU] | Freq: Every day | SUBCUTANEOUS | Status: DC
  Administered 2017-05-21: 12 [IU] via SUBCUTANEOUS
  Filled 2017-05-21: qty 0.12

## 2017-05-21 MED ORDER — INSULIN GLARGINE 100 UNIT/ML ~~LOC~~ SOLN
25.0000 [IU] | Freq: Every day | SUBCUTANEOUS | Status: DC
  Administered 2017-05-21: 25 [IU] via SUBCUTANEOUS
  Filled 2017-05-21: qty 0.25

## 2017-05-21 MED ORDER — POTASSIUM CHLORIDE 10 MEQ/100ML IV SOLN
10.0000 meq | INTRAVENOUS | Status: AC
  Administered 2017-05-21 (×4): 10 meq via INTRAVENOUS
  Filled 2017-05-21 (×4): qty 100

## 2017-05-21 MED ORDER — GLUCERNA SHAKE PO LIQD
237.0000 mL | Freq: Three times a day (TID) | ORAL | Status: DC
  Administered 2017-05-21 – 2017-05-24 (×9): 237 mL via ORAL
  Filled 2017-05-21: qty 237

## 2017-05-21 MED ORDER — SODIUM CHLORIDE 0.9% FLUSH
10.0000 mL | INTRAVENOUS | Status: DC | PRN
  Administered 2017-05-21 (×2): 10 mL
  Filled 2017-05-21 (×2): qty 40

## 2017-05-21 NOTE — Progress Notes (Signed)
Initial Nutrition Assessment  DOCUMENTATION CODES:   Severe malnutrition in context of social or environmental circumstances  INTERVENTION:   Glucerna Shake (prefers strawberry) po TID, each supplement provides 220 kcal and 10 grams of protein  Continue multivitamin, thiamine and folic acid   NUTRITION DIAGNOSIS:   Severe Malnutrition related to social / environmental circumstances(polysubstance abuse) as evidenced by severe muscle depletion, severe fat depletion.  GOAL:   Patient will meet greater than or equal to 90% of their needs  MONITOR:   PO intake, Supplement acceptance, Weight trends, I & O's  REASON FOR ASSESSMENT:   Malnutrition Screening Tool    ASSESSMENT:   Pt with PMH of polysubstance abuse, hepatitis C, and insulin dependent diabetes mellitus (noncompliant) presents with DKA   Per chart, pt noncompliant with insulin.  Urine drug screen positive for opiates (11/29) on CIWA protocol  Pt endorses weight loss, unsure of amount or time frame. Stating a UBW of 120 lbs. Per chart pt shows a decline in weight status.  Pt reports a decreased appetite but did not elaborate. RD inquired about time frame or amount pt states "over a month"  Pt with history of being identified with malnutrition Pt reports enjoying nutritional supplements and is amenable to supplementation while admitted.  Pt reports experiencing nausea this morning but no other nutrition impact symptoms at this time.  Pt ordered lunch meal tray at time of visit. Pt ordered baked fish, coleslaw, cheesy grits and strawberry ice cream.  Labs reviewed; CBG 107-292, BUN 27, Creatinine 1.78, Hemoglobin 9.8, Total bilirubin 2.2 (11/29) Medications reviewed; ferrous sulfate, folic acid, sliding scale insulin, Creon, multivitamin, Protonix, Miralax, thiamine   NUTRITION - FOCUSED PHYSICAL EXAM:    Most Recent Value  Orbital Region  Moderate depletion  Upper Arm Region  Severe depletion  Thoracic and  Lumbar Region  Severe depletion  Buccal Region  Severe depletion  Temple Region  Severe depletion  Clavicle Bone Region  Severe depletion  Clavicle and Acromion Bone Region  Severe depletion  Scapular Bone Region  Unable to assess  Dorsal Hand  Moderate depletion  Patellar Region  Severe depletion  Anterior Thigh Region  Severe depletion  Posterior Calf Region  Severe depletion  Edema (RD Assessment)  None  Hair  Reviewed  Eyes  Other (Comment) [yellow color (jaundice)]  Mouth  Unable to assess  Skin  Reviewed  Nails  Reviewed      Diet Order:  Diet Carb Modified Fluid consistency: Thin; Room service appropriate? Yes  EDUCATION NEEDS:   No education needs have been identified at this time  Skin:  Skin Assessment: Reviewed RN Assessment  Last BM:  No BM recorded  Height:   Ht Readings from Last 1 Encounters:  05/20/17 5\' 7"  (1.702 m)    Weight:   Wt Readings from Last 1 Encounters:  05/20/17 99 lb (44.9 kg)    Ideal Body Weight:  67 kg  BMI:  Body mass index is 15.51 kg/m.  Estimated Nutritional Needs:   Kcal:  1500-1700  Protein:  75-85 grams  Fluid:  >/= 1.5 L/d  Fransisca KaufmannAllison Ioannides, MS, RDN, LDN 05/21/2017 11:58 AM

## 2017-05-21 NOTE — Progress Notes (Addendum)
Inpatient Diabetes Program Recommendations  AACE/ADA: New Consensus Statement on Inpatient Glycemic Control (2015)  Target Ranges:  Prepandial:   less than 140 mg/dL      Peak postprandial:   less than 180 mg/dL (1-2 hours)      Critically ill patients:  140 - 180 mg/dL   Results for Raymond Winters, Raymond Winters (MRN [id number]) as of 05/21/2017 10:26  Ref. Range 05/06/2017 14:20  Hemoglobin A1C Unknown 13.1    Admit with: DKA (Glucose 856 mg/dl, Anion Gap 35, CO2 10 on admission)  History: DM, Polysubstance Abuse, CKD  Home DM Meds: Lantus 25 units QHS       Novolog 0-12 units TID  Current Insulin Orders: Novolog Sensitive Correction Scale/ SSI (0-9 units) TID AC        MD- Note patient saw Dr. Reather LittlerAjay Kumar [facility] on 05/06/17.  At that visit, patient was instructed to take the following insulin regimen at home:  Take 10 units of Lantus on waking up and bedtime (BID dosing) If the morning sugars are below 80 week and reduce the dose to 8 units NOVOLOG: Take at least 3-4 units before every meal regardless of blood sugar level If the blood sugar before eating is over 200 add additional insulin as follows: 200-300 = 2 more units Over 300 = 4 more units  Last A1c was 13.1%- Extremely poor control at home.  Has follow-up appt with Dr. Lucianne MussKumar on 06/01/17 and 06/04/17.     Note patient transitioned off the IV Insulin drip this AM.  Lantus 12 units given at 2am.  MD- Please consider the following:  1. Start Lantus 20 units daily (please start this afternoon)  2. Start Novolog Meal Coverage: Novolog 3 units TID with meals (hold if pt eats <50% of meal)   Addendum 3:30pm- Spoke to pt today about his home insulin regimen.  Pt was very sleepy and did not make much eye contact during conversation.  Reviewed home insulin regimen.  Reminded pt that Dr. Lucianne MussKumar had advised him to take Lantus 10 units BID + Novolog 3-4 units TID with meals + Novolog SSI.  Pt stated he had not taken his  insulin for 2 weeks prior to admission b/c he was "too busy".  Has CBG meter at home.  Has Insulin pens at home.  Checking CBGs BID.  Asked pt if he had any questions regarding his diabetes care at home.  Pt stated he did not.      --Will follow patient during hospitalization--  Ambrose FinlandJeannine Johnston Rafaelita Foister RN, MSN, CDE Diabetes Coordinator Inpatient Glycemic Control Team Team Pager: [phone] (8a-5p)

## 2017-05-21 NOTE — Progress Notes (Signed)
PROGRESS NOTE    Raymond Winters  UEA:[id number] DOB: [birth year] DOA: 05/20/2017 PCP: Jaclyn ShaggyAmao, [name], MD  Brief Narrative: [age] year old male with diabetes on insulin, long history of noncompliance, polysubstance abuse and alcohol. Admitted with DKA and acute kidney injury, admits to poor compliance and extremely poorly motivated to change   Assessment & Plan:   Principal Problem:   DKA (diabetic ketoacidoses) (HCC) -Due to noncompliance -Corrected with insulin drip, IV fluids -Restarted Lantus, educated on compliance -Continue sliding scale  Nausea and vomiting -Due to above, supportive care, PPI advance diet as tolerated    Polysubstance abuse (HCC)/tobacco abuse and alcohol abuse -Counseled, continue thiamine and multivitamins -Monitor on CIWA protocol    Acute kidney injury on chronic kidney disease stage II -Due to dehydration/DKA improving -Continue IV fluids today    Chronic anemia -Baseline hemoglobin is 9.5, hemoglobin was significantly hemoconcentrated on admission due to dehydration and DKA now improved back to baseline, monitor -Check anemia panel, no overt bleeding noted at this time   DVT prophylaxis: SCDs Code Status: Full code Family Communication: No family at bedside Disposition Plan: Home pending improvement   Procedures:   Antimicrobials:    Subjective: Some nausea, otherwise feels okay  Objective: Vitals:   05/21/17 0208 05/21/17 0531 05/21/17 0847 05/21/17 1207  BP: (!) 104/50 115/73 106/69   Pulse: 99 (!) 101 (!) 106   Resp:  17 17   Temp:   99.6 F (37.6 C) 98.8 F (37.1 C)  TempSrc:   Oral Axillary  SpO2: 99% 99% 98%   Weight:      Height:        Intake/Output Summary (Last 24 hours) at 05/21/2017 1212 Last data filed at 05/21/2017 1006 Gross per 24 hour  Intake 1754.13 ml  Output 700 ml  Net 1054.13 ml   Filed Weights   05/20/17 1140  Weight: 44.9 kg (99 lb)    Examination:  General exam: Thinly built African-American male  alert awake, no distress Respiratory system: Clear to auscultation. Respiratory effort normal. Cardiovascular system: S1 & S2 heard, RRR. No JVD, murmurs, rubs, gallops Gastrointestinal system: Abdomen is nondistended, soft and nontender.Normal bowel sounds heard. Central nervous system: Alert and oriented. No focal neurological deficits. Extremities: Symmetric 5 x 5 power. Skin: No rashes, lesions or ulcers Psychiatry: Judgement and insight appear normal. Mood & affect appropriate.     Data Reviewed:   CBC: Recent Labs  Lab 05/20/17 1211 05/20/17 1227 05/21/17 0504  WBC 8.9  --  9.9  NEUTROABS 7.5  --   --   HGB 14.2 16.3 9.8*  HCT 43.0 48.0 29.1*  MCV 80.8  --  77.2*  PLT 244  --  165   Basic Metabolic Panel: Recent Labs  Lab 05/20/17 1211 05/20/17 1227 05/20/17 1755 05/20/17 2115 05/21/17 0027 05/21/17 0504  NA 131* 130* 139 142 141 138  K 5.9* 5.7* 3.7 3.4* 3.1* 3.5  CL 86* 93* 98* 106 108 108  CO2 10*  --  14* 19* 24 25  GLUCOSE 856* >700* 322* 182* 143* 85  BUN 39* 36* 39* 34* 31* 27*  CREATININE 3.30* 2.30* 2.98* 2.49* 2.00* 1.78*  CALCIUM 9.1  --  9.2 8.4* 8.0* 8.2*  MG  --   --  2.3  --   --   --    GFR: Estimated Creatinine Clearance: 28.7 mL/min (A) (by C-G formula based on SCr of 1.78 mg/dL (H)). Liver Function Tests: Recent Labs  Lab 05/20/17 1211  AST 49*  ALT 45  ALKPHOS 193*  BILITOT 2.2*  PROT 8.0  ALBUMIN 3.5   No results for input(s): LIPASE, AMYLASE in the last 168 hours. No results for input(s): AMMONIA in the last 168 hours. Coagulation Profile: No results for input(s): INR, PROTIME in the last 168 hours. Cardiac Enzymes: No results for input(s): CKTOTAL, CKMB, CKMBINDEX, TROPONINI in the last 168 hours. BNP (last 3 results) No results for input(s): PROBNP in the last 8760 hours. HbA1C: No results for input(s): HGBA1C in the last 72 hours. CBG: Recent Labs  Lab 05/21/17 0329 05/21/17 0438 05/21/17 0533 05/21/17 0800  05/21/17 1132  GLUCAP 183* 111* 107* 175* 90   Lipid Profile: No results for input(s): CHOL, HDL, LDLCALC, TRIG, CHOLHDL, LDLDIRECT in the last 72 hours. Thyroid Function Tests: No results for input(s): TSH, T4TOTAL, FREET4, T3FREE, THYROIDAB in the last 72 hours. Anemia Panel: No results for input(s): VITAMINB12, FOLATE, FERRITIN, TIBC, IRON, RETICCTPCT in the last 72 hours. Urine analysis:    Component Value Date/Time   COLORURINE YELLOW 05/20/2017 1138   APPEARANCEUR HAZY (A) 05/20/2017 1138   LABSPEC 1.018 05/20/2017 1138   PHURINE 5.0 05/20/2017 1138   GLUCOSEU >=500 (A) 05/20/2017 1138   GLUCOSEU >=1000 (A) 05/06/2017 1359   HGBUR MODERATE (A) 05/20/2017 1138   BILIRUBINUR NEGATIVE 05/20/2017 1138   BILIRUBINUR neg 09/26/2015 1531   KETONESUR 80 (A) 05/20/2017 1138   PROTEINUR NEGATIVE 05/20/2017 1138   UROBILINOGEN 0.2 05/06/2017 1359   NITRITE NEGATIVE 05/20/2017 1138   LEUKOCYTESUR SMALL (A) 05/20/2017 1138   Sepsis Labs: @LABRCNTIP (procalcitonin:4,lacticidven:4)  )No results found for this or any previous visit (from the past 240 hour(s)).       Radiology Studies: Dg Chest Portable 1 View  Result Date: 05/20/2017 CLINICAL DATA:  Initial evaluation for central line placement. EXAM: PORTABLE CHEST 1 VIEW COMPARISON:  A prior radiograph from earlier the same day. FINDINGS: Cardiac and mediastinal silhouettes are stable in size and contour, and remain within normal limits. Aortic atherosclerosis. There has been interval placement of a right IJ approach centra venous catheter with tip seen overlying the distal SVC. Lungs are well inflated. No pulmonary edema or fluid fusion. No focal infiltrates. No pneumothorax. Osseous structures unchanged. IMPRESSION: 1. Interval placement of right IJ approach centra venous catheter with tip overlying the distal SVC. No complication. 2. No new active cardiopulmonary disease. Electronically Signed   By: Rise MuBenjamin  McClintock M.D.   On:  05/20/2017 16:20   Dg Chest Port 1 View  Result Date: 05/20/2017 CLINICAL DATA:  Shortness of breath 2 weeks. Nausea and vomiting today. EXAM: PORTABLE CHEST 1 VIEW COMPARISON:  Two-view chest x-ray 02/20/2017. FINDINGS: The heart size and mediastinal contours are within normal limits. Both lungs are clear. The visualized skeletal structures are unremarkable. IMPRESSION: Negative two view chest x-ray Electronically Signed   By: Marin Robertshristopher  Mattern M.D.   On: 05/20/2017 12:49        Scheduled Meds: . aspirin EC  81 mg Oral Daily  . atorvastatin  40 mg Oral Daily  . feeding supplement (GLUCERNA SHAKE)  237 mL Oral TID BM  . ferrous sulfate  325 mg Oral BID WC  . folic acid  1 mg Oral Daily  . gabapentin  600 mg Oral QHS  . insulin aspart  0-9 Units Subcutaneous TID WC  . lipase/protease/amylase  24,000 Units Oral TID AC  . mirtazapine  30 mg Oral QHS  . multivitamin with minerals  1 tablet Oral Daily  . pantoprazole (  PROTONIX) IV  40 mg Intravenous Q12H  . polyethylene glycol  17 g Oral Daily  . thiamine  100 mg Oral Daily   Continuous Infusions: . sodium chloride    . lactated ringers       LOS: 1 day    Time spent:    Zannie Cove, MD [facility] Page via [email or web address], password [email or web address] After 7PM please contact night-coverage  05/21/2017, 12:12 PM

## 2017-05-21 NOTE — Progress Notes (Signed)
Notified MD of last 3 glucose and BMET drawn.  Orders to continue insulin drip until lab results back.  Also, Pt requests drink and N/V has subsided. Ok to give ice chips and non-caloric drink at this time.

## 2017-05-22 LAB — CBC
HCT: 27.5 % — ABNORMAL LOW (ref 39.0–52.0)
HEMOGLOBIN: 9.4 g/dL — AB (ref 13.0–17.0)
MCH: 26.7 pg (ref 26.0–34.0)
MCHC: 34.2 g/dL (ref 30.0–36.0)
MCV: 78.1 fL (ref 78.0–100.0)
PLATELETS: 121 10*3/uL — AB (ref 150–400)
RBC: 3.52 MIL/uL — AB (ref 4.22–5.81)
RDW: 13.4 % (ref 11.5–15.5)
WBC: 7.5 10*3/uL (ref 4.0–10.5)

## 2017-05-22 LAB — GLUCOSE, CAPILLARY
GLUCOSE-CAPILLARY: 40 mg/dL — AB (ref 65–99)
GLUCOSE-CAPILLARY: 139 mg/dL — AB (ref 65–99)
GLUCOSE-CAPILLARY: 135 mg/dL — AB (ref 65–99)
GLUCOSE-CAPILLARY: 299 mg/dL — AB (ref 65–99)
Glucose-Capillary: 163 mg/dL — ABNORMAL HIGH (ref 65–99)
Glucose-Capillary: 145 mg/dL — ABNORMAL HIGH (ref 65–99)

## 2017-05-22 LAB — BASIC METABOLIC PANEL
Anion gap: 5 (ref 5–15)
BUN: 18 mg/dL (ref 6–20)
CALCIUM: 8.6 mg/dL — AB (ref 8.9–10.3)
CO2: 27 mmol/L (ref 22–32)
CREATININE: 1.37 mg/dL — AB (ref 0.61–1.24)
Chloride: 110 mmol/L (ref 101–111)
GFR calc Af Amer: >60 mL/min (ref >60)
GFR, EST NON AFRICAN AMERICAN: 55 mL/min — AB (ref >60)
GLUCOSE: 35 mg/dL — AB (ref 65–99)
POTASSIUM: 3.1 mmol/L — AB (ref 3.5–5.1)
SODIUM: 142 mmol/L (ref 135–145)

## 2017-05-22 MED ORDER — DEXTROSE 50 % IV SOLN
INTRAVENOUS | Status: AC
  Administered 2017-05-22: 50 mL
  Filled 2017-05-22: qty 50

## 2017-05-22 MED ORDER — POTASSIUM CHLORIDE CRYS ER 20 MEQ PO TBCR
40.0000 meq | EXTENDED_RELEASE_TABLET | Freq: Once | ORAL | Status: AC
  Administered 2017-05-22: 40 meq via ORAL
  Filled 2017-05-22: qty 2

## 2017-05-22 MED ORDER — INSULIN GLARGINE 100 UNIT/ML ~~LOC~~ SOLN
20.0000 [IU] | Freq: Every day | SUBCUTANEOUS | Status: DC
  Administered 2017-05-22 – 2017-05-23 (×2): 20 [IU] via SUBCUTANEOUS
  Filled 2017-05-22 (×2): qty 0.2

## 2017-05-22 NOTE — Progress Notes (Signed)
PROGRESS NOTE    Raymond ButteryGary Sanderson  [id number] DOB: Oct 22, 1960 DOA: 05/20/2017 PCP: Jaclyn ShaggyAmao, [name], MD  Brief Narrative: 56 year old male with diabetes on insulin, long history of noncompliance, polysubstance abuse and alcohol. Admitted with DKA and acute kidney injury, admits to poor compliance and extremely poorly motivated to change. cBGs fluctuating, some nausea and intermittently poor by mouth intake   Assessment & Plan:     DKA (diabetic ketoacidoses) (HCC) -Due to noncompliance -Corrected with insulin drip, IV fluids -Restarted Lantus, educated on compliance -Continue sliding scale -will cut down Lantus dose, supposed to be on 10 units twice a day with meal correction per endocrinologist, will resume this regimen at discharge, PO intake still poor at this time -out of bed, ambulate  Nausea and vomiting -Due to above, supportive care, PPI advance diet as tolerated    Polysubstance abuse (HCC)/tobacco abuse and alcohol abuse -Counseled, continue thiamine and multivitamins -no withdrawal noted    Acute kidney injury on chronic kidney disease stage II -Due to dehydration/DKA improving -cut down IV Fluids Today    Chronic anemia -Baseline hemoglobin is 9.5, hemoglobin was significantly hemoconcentrated on admission due to dehydration and DKA now improved back to baseline, monitor -stable, defer to PCP for workup  Noncompliance -main limiting factor  DVT prophylaxis: SCDs Code Status: Full code Family Communication: No family at bedside Disposition Plan: home tomorrow if stable   Procedures:   Antimicrobials:    Subjective: -CBG is low this morning, still reports nausea, eating less  Objective: Vitals:   05/21/17 2021 05/22/17 0100 05/22/17 0559 05/22/17 0822  BP: (!) 86/58 111/69 (!) 107/53 120/80  Pulse: (!) 103 61 99 87  Resp:  18 17 18   Temp: 98.1 F (36.7 C) 98.2 F (36.8 C) 98 F (36.7 C) 98.2 F (36.8 C)  TempSrc: Oral Oral Oral Oral  SpO2: 100% 100%  97% 100%  Weight:      Height:        Intake/Output Summary (Last 24 hours) at 05/22/2017 1250 Last data filed at 05/22/2017 0947 Gross per 24 hour  Intake 1090 ml  Output 800 ml  Net 290 ml   Filed Weights   05/20/17 1140  Weight: 44.9 kg (99 lb)    Examination:  Gen: Awake, Alert, Oriented X 3, thinly built African-American male, no distress HEENT: PERRLA, Neck supple, no JVD Lungs: Good air movement bilaterally, CTAB CVS: RRR,No Gallops,Rubs or new Murmurs Abd: soft, Non tender, non distended, BS present Extremities: No Cyanosis, Clubbing or edema Skin: no new rashes  Data Reviewed:   CBC: Recent Labs  Lab 05/20/17 1211 05/20/17 1227 05/21/17 0504 05/21/17 1852 05/22/17 0428  WBC 8.9  --  9.9  --  7.5  NEUTROABS 7.5  --   --   --   --   HGB 14.2 16.3 9.8* 8.6* 9.4*  HCT 43.0 48.0 29.1* 24.7* 27.5*  MCV 80.8  --  77.2*  --  78.1  PLT 244  --  165  --  121*   Basic Metabolic Panel: Recent Labs  Lab 05/20/17 1755 05/20/17 2115 05/21/17 0027 05/21/17 0504 05/22/17 0428  NA 139 142 141 138 142  K 3.7 3.4* 3.1* 3.5 3.1*  CL 98* 106 108 108 110  CO2 14* 19* 24 25 27   GLUCOSE 322* 182* 143* 85 35*  BUN 39* 34* 31* 27* 18  CREATININE 2.98* 2.49* 2.00* 1.78* 1.37*  CALCIUM 9.2 8.4* 8.0* 8.2* 8.6*  MG 2.3  --   --   --   --  GFR: Estimated Creatinine Clearance: 37.3 mL/min (A) (by C-G formula based on SCr of 1.37 mg/dL (H)). Liver Function Tests: Recent Labs  Lab 05/20/17 1211  AST 49*  ALT 45  ALKPHOS 193*  BILITOT 2.2*  PROT 8.0  ALBUMIN 3.5   No results for input(s): LIPASE, AMYLASE in the last 168 hours. No results for input(s): AMMONIA in the last 168 hours. Coagulation Profile: No results for input(s): INR, PROTIME in the last 168 hours. Cardiac Enzymes: No results for input(s): CKTOTAL, CKMB, CKMBINDEX, TROPONINI in the last 168 hours. BNP (last 3 results) No results for input(s): PROBNP in the last 8760 hours. HbA1C: No results for  input(s): HGBA1C in the last 72 hours. CBG: Recent Labs  Lab 05/21/17 2053 05/22/17 0620 05/22/17 0645 05/22/17 0731 05/22/17 1157  GLUCAP 179* 40* 163* 145* 139*   Lipid Profile: No results for input(s): CHOL, HDL, LDLCALC, TRIG, CHOLHDL, LDLDIRECT in the last 72 hours. Thyroid Function Tests: No results for input(s): TSH, T4TOTAL, FREET4, T3FREE, THYROIDAB in the last 72 hours. Anemia Panel: No results for input(s): VITAMINB12, FOLATE, FERRITIN, TIBC, IRON, RETICCTPCT in the last 72 hours. Urine analysis:    Component Value Date/Time   COLORURINE YELLOW 05/20/2017 1138   APPEARANCEUR HAZY (A) 05/20/2017 1138   LABSPEC 1.018 05/20/2017 1138   PHURINE 5.0 05/20/2017 1138   GLUCOSEU >=500 (A) 05/20/2017 1138   GLUCOSEU >=1000 (A) 05/06/2017 1359   HGBUR MODERATE (A) 05/20/2017 1138   BILIRUBINUR NEGATIVE 05/20/2017 1138   BILIRUBINUR neg 09/26/2015 1531   KETONESUR 80 (A) 05/20/2017 1138   PROTEINUR NEGATIVE 05/20/2017 1138   UROBILINOGEN 0.2 05/06/2017 1359   NITRITE NEGATIVE 05/20/2017 1138   LEUKOCYTESUR SMALL (A) 05/20/2017 1138   Sepsis Labs: @LABRCNTIP (procalcitonin:4,lacticidven:4)  )No results found for this or any previous visit (from the past 240 hour(s)).       Radiology Studies: Dg Chest Portable 1 View  Result Date: 05/20/2017 CLINICAL DATA:  Initial evaluation for central line placement. EXAM: PORTABLE CHEST 1 VIEW COMPARISON:  A prior radiograph from earlier the same day. FINDINGS: Cardiac and mediastinal silhouettes are stable in size and contour, and remain within normal limits. Aortic atherosclerosis. There has been interval placement of a right IJ approach centra venous catheter with tip seen overlying the distal SVC. Lungs are well inflated. No pulmonary edema or fluid fusion. No focal infiltrates. No pneumothorax. Osseous structures unchanged. IMPRESSION: 1. Interval placement of right IJ approach centra venous catheter with tip overlying the distal  SVC. No complication. 2. No new active cardiopulmonary disease. Electronically Signed   By: Rise Mu M.D.   On: 05/20/2017 16:20        Scheduled Meds: . aspirin EC  81 mg Oral Daily  . atorvastatin  40 mg Oral Daily  . feeding supplement (GLUCERNA SHAKE)  237 mL Oral TID BM  . ferrous sulfate  325 mg Oral BID WC  . folic acid  1 mg Oral Daily  . gabapentin  600 mg Oral QHS  . insulin aspart  0-9 Units Subcutaneous TID WC  . insulin glargine  20 Units Subcutaneous QHS  . lipase/protease/amylase  24,000 Units Oral TID AC  . mirtazapine  30 mg Oral QHS  . multivitamin with minerals  1 tablet Oral Daily  . pantoprazole (PROTONIX) IV  40 mg Intravenous Q12H  . polyethylene glycol  17 g Oral Daily  . thiamine  100 mg Oral Daily   Continuous Infusions:    LOS: 2 days  Time spent: 35min    Zannie CovePreetha Wheeler Incorvaia, MD [facility] Page via [email or web address], password [email or web address] After 7PM please contact night-coverage  05/22/2017, 12:50 PM

## 2017-05-22 NOTE — Progress Notes (Signed)
CRITICAL VALUE ALERT  Critical Value:  Glucose 35  Date & Time Notied:  05/22/17 0610  Provider Notified: X. Blount  Orders Received/Actions taken: Pt given orange juice, 50mL D50.  CBG recheck at 0645 163mg /dL.

## 2017-05-23 LAB — GLUCOSE, CAPILLARY
GLUCOSE-CAPILLARY: 145 mg/dL — AB (ref 65–99)
GLUCOSE-CAPILLARY: 207 mg/dL — AB (ref 65–99)
GLUCOSE-CAPILLARY: 141 mg/dL — AB (ref 65–99)
GLUCOSE-CAPILLARY: 215 mg/dL — AB (ref 65–99)
GLUCOSE-CAPILLARY: 413 mg/dL — AB (ref 65–99)

## 2017-05-23 MED ORDER — PANTOPRAZOLE SODIUM 40 MG PO TBEC
40.0000 mg | DELAYED_RELEASE_TABLET | Freq: Two times a day (BID) | ORAL | Status: DC
  Administered 2017-05-23 – 2017-05-24 (×3): 40 mg via ORAL
  Filled 2017-05-23 (×3): qty 1

## 2017-05-23 MED ORDER — INSULIN ASPART 100 UNIT/ML ~~LOC~~ SOLN
6.0000 [IU] | Freq: Once | SUBCUTANEOUS | Status: AC
  Administered 2017-05-23: 6 [IU] via SUBCUTANEOUS

## 2017-05-23 MED ORDER — INSULIN GLARGINE 100 UNIT/ML SOLOSTAR PEN: 10.0000 [IU] | PEN_INJECTOR | Freq: Two times a day (BID) | SUBCUTANEOUS | 0 refills | Status: DC

## 2017-05-23 NOTE — Evaluation (Signed)
Physical Therapy Evaluation Patient Details Name: Raymond Winters MRN: [id number] DOB: 1961/02/23 Today's Date: 05/23/2017   History of Present Illness   56 yo male with DM on insulin, long hx noncompliance, polysubstance abuse and ETOH. Admitted 11/29 with DKA and AKI, admits to poor compliance and extremely poorly motivated to change (per MD).     Clinical Impression  Pt presents with mild limitations to functional mobility due to balance dysfunction and generalized weakness, requires at least supervision up to minimal assistance for safety during dynamic activities and moderate to high risk for falling.  Recommend ambulate 2-3x/day with nursing assistance and PT will see acutely to address balance/gait improvement and use of device.  Per chart pt has RW and cane at home, as well as help 24/7.  Recommend consider HHPT if pt does not demonstrate steady gait and improved balance prior to transition to home.  See below for details of exam findings.      Follow Up Recommendations Home health PT;Supervision - Intermittent    Equipment Recommendations    (has; needs to use his cane at home)   Recommendations for Other Services       Precautions / Restrictions Precautions Precautions: Fall Precaution Comments: unsteady on feet; chair/bed alarm Restrictions Weight Bearing Restrictions: No      Mobility  Bed Mobility Overal bed mobility: Independent                Transfers Overall transfer level: Needs assistance Equipment used: None Transfers: Sit to/from Stand Sit to Stand: Supervision         General transfer comment: mildly unsteady with transitioning sit<>stand, standby assist for safety and pt confidence  Ambulation/Gait Ambulation/Gait assistance: Supervision;Min guard Ambulation Distance (Feet): 300 Feet Assistive device: None Gait Pattern/deviations: Step-through pattern;Staggering right;Staggering left     General Gait Details: pt generally mildly unsteady using  stepping strategy with any LOB, noted primarily during gait challenges (see DGI); slow/multistep turning, difficulty deviating from straigth path, occasionally runs into obstacles in hall  Stairs            Wheelchair Mobility    Modified Rankin (Stroke Patients Only)       Balance Overall balance assessment: Needs assistance Sitting-balance support: Feet supported;No upper extremity supported Sitting balance-Leahy Scale: Good     Standing balance support: During functional activity;No upper extremity supported Standing balance-Leahy Scale: Good Standing balance comment: dynamic with some unsteadiness noted in gait, static accepts mild challenge                  Standardized Balance Assessment Standardized Balance Assessment : Dynamic Gait Index   Dynamic Gait Index Level Surface: Normal Change in Gait Speed: Mild Impairment Gait with Horizontal Head Turns: Moderate Impairment Gait with Vertical Head Turns: Moderate Impairment Gait and Pivot Turn: Mild Impairment Step Over Obstacle: Moderate Impairment Step Around Obstacles: Mild Impairment Steps: Mild Impairment Total Score: 14       Pertinent Vitals/Pain Pain Assessment: No/denies pain    Home Living Family/patient expects to be discharged to:: Private residence Living Arrangements: Other relatives Available Help at Discharge: Family Type of Home: Apartment Home Access: Stairs to enter Entrance Stairs-Rails: None Entrance Stairs-Number of Steps: 1 Home Layout: One level Home Equipment: Grab bars - tub/shower;Walker - 2 wheels;Cane - single point      Prior Function Level of Independence: Independent         Comments: Pt was independent with ADLs, and iADLs and community ambulator     Hand  Dominance   Dominant Hand: Right    Extremity/Trunk Assessment   Upper Extremity Assessment Upper Extremity Assessment: Defer to OT evaluation;Overall WFL for tasks assessed    Lower Extremity  Assessment Lower Extremity Assessment: Overall WFL for tasks assessed    Cervical / Trunk Assessment Cervical / Trunk Assessment: Normal  Communication   Communication: No difficulties  Cognition Arousal/Alertness: Awake/alert Behavior During Therapy: Flat affect Overall Cognitive Status: Within Functional Limits for tasks assessed                                 General Comments: unable to detect over cognitive issues, pt minimally slowed in respones, but seems accurate compared to previous chart info      General Comments General comments (skin integrity, edema, etc.): strong body odor; cooperative and patient    Exercises     Assessment/Plan    PT Assessment Patient needs continued PT services  PT Problem List Decreased balance;Decreased mobility;Decreased cognition       PT Treatment Interventions DME instruction;Gait training;Functional mobility training;Patient/family education;Therapeutic exercise;Therapeutic activities;Balance training    PT Goals (Current goals can be found in the Care Plan section)  Acute Rehab PT Goals Patient Stated Goal: walk better, then go home PT Goal Formulation: With patient Time For Goal Achievement: 06/06/17 Potential to Achieve Goals: Good    Frequency Min 3X/week   Barriers to discharge        Co-evaluation               AM-PAC PT "6 Clicks" Daily Activity  Outcome Measure Difficulty turning over in bed (including adjusting bedclothes, sheets and blankets)?: None Difficulty moving from lying on back to sitting on the side of the bed? : None Difficulty sitting down on and standing up from a chair with arms (e.g., wheelchair, bedside commode, etc,.)?: A Little Help needed moving to and from a bed to chair (including a wheelchair)?: A Little Help needed walking in hospital room?: A Lot Help needed climbing 3-5 steps with a railing? : Total 6 Click Score: 17    End of Session   Activity Tolerance: Patient  tolerated treatment well Patient left: in chair;with call bell/phone within reach;with chair alarm set Nurse Communication: Mobility status PT Visit Diagnosis: Unsteadiness on feet (R26.81)    Time: 0940-1005 PT Time Calculation (min) (ACUTE ONLY): 25 min   Charges:   PT Evaluation $PT Eval Low Complexity: 1 Low PT Treatments $Gait Training: 8-22 mins   PT G Codes:   PT G-Codes **NOT FOR INPATIENT CLASS** Functional Assessment Tool Used: Dynamic Gait Index Functional Limitation: Mobility: Walking and moving around Mobility: Walking and Moving Around Current Status ([id number](G8978): At least 40 percent but less than 60 percent impaired, limited or restricted Mobility: Walking and Moving Around Goal Status [phone](G8979): At least 1 percent but less than 20 percent impaired, limited or restricted    Narda AmberJennifer Osher Oettinger, PT, DPT, MS Board Certified Geriatric Clinical Specialist  Narda AmberMartin, Jerri Glauser [facility] [place] 05/23/2017, 10:15 AM

## 2017-05-23 NOTE — Progress Notes (Signed)
Patient said brother  is on the way to pick up him from hospital.

## 2017-05-23 NOTE — Progress Notes (Signed)
NURSING PROGRESS NOTE  Raymond ButteryGary Winters [id number] Discharge Data: 05/23/2017 3:25 PM Attending Provider: Zannie CoveJoseph, Preetha, MD WUJ:[id number]:Amao, Odette HornsEnobong, MD   Raymond ButteryGary Winters to be D/C'd Home per MD order.    All IV's will be discontinued and monitored for bleeding.  All belongings will be returned to patient for patient to take home. Patient is waiting for his brother to pick him up.  Last Documented Vital Signs:  Blood pressure (!) 101/59, pulse (!) 106, temperature 99.3 F (37.4 C), temperature source Oral, resp. rate 18, height 5\' 7"  (1.702 m), weight 44.9 kg (99 lb), SpO2 98 %.  Monico BlitzJameela RN

## 2017-05-23 NOTE — Progress Notes (Signed)
Patient's sister called the hospital and she said, she is not able to come and pick him up today and she might come tomorrow morning and pick him up . Patient is aware about that and he don't have the changing cloth for ride to go home by transportation. Called Dr.karkandy updated the information .Charge Nurse vicky aware of that.

## 2017-05-23 NOTE — Care Management Note (Signed)
Case Management Note Donn PieriniKristi Jeremih Dearmas RN, BSN Unit 4E-Case Manager-- 5W weekend coverage [phone]  Patient Details  Name: Raymond ButteryGary Winters MRN: [id number] Date of Birth: [birth year]  Subjective/Objective:  Pt admitted with DKA                  Action/Plan: PTA pt lived at home with family- has hx of non-compliance and polysubstance abuse- per conversation with pt he has RW at home- choice offered for HHRN/PT services- pt states he does not have a preference and is fine [facility] address and phone # confirmed in epic- [facility]-    Expected Discharge Date:  05/23/17               Expected Discharge Plan:  Home w Home Health Services  In-House Referral:  NA  Discharge planning Services  CM Consult  Post Acute Care Choice:  Home Health Choice offered to:  Patient  DME Arranged:  N/A DME Agency:  NA  HH Arranged:  RN, PT HH Agency:  [facility]  Status of Service:  Completed, signed off  If discussed at Long Length of Stay Meetings, dates discussed:    Discharge Disposition: home/home health   Additional Comments:  Darrold SpanWebster, Valisha Heslin Hall, RN 05/23/2017, 11:36 AM

## 2017-05-23 NOTE — Progress Notes (Signed)
Discharge instruction given.Dischrarge papers given.Peripheral line removed.Patient is waiting for the ride to go home.

## 2017-05-23 NOTE — Progress Notes (Signed)
Patient is discharging today.Paged Care Management Kristy regarding home care management .

## 2017-05-23 NOTE — Progress Notes (Signed)
Patient is ready for the discharge today .Patient said his family members Rockey Situ/brother will come and pick him up around 5 to 6 pm  Today. MD aware of that.

## 2017-05-23 NOTE — Progress Notes (Signed)
Patient is alert and oriented, CIWA was assessed during the night, no changes noted. Pt ambulated in the hallway without distress. Blood sugar within normal limit. Will continue to monitor

## 2017-05-24 ENCOUNTER — Other Ambulatory Visit: Payer: Self-pay | Admitting: Family Medicine

## 2017-05-24 ENCOUNTER — Telehealth: Payer: Self-pay | Admitting: Endocrinology

## 2017-05-24 ENCOUNTER — Other Ambulatory Visit: Payer: Self-pay

## 2017-05-24 LAB — GLUCOSE, CAPILLARY
Glucose-Capillary: 317 mg/dL — ABNORMAL HIGH (ref 65–99)
Glucose-Capillary: 333 mg/dL — ABNORMAL HIGH (ref 65–99)

## 2017-05-24 MED ORDER — ONETOUCH VERIO FLEX SYSTEM W/DEVICE KIT: 1.0000 | PACK | Freq: Three times a day (TID) | 1 refills | Status: DC

## 2017-05-24 MED ORDER — INSULIN GLARGINE 100 UNIT/ML ~~LOC~~ SOLN
10.0000 [IU] | Freq: Every day | SUBCUTANEOUS | Status: DC
  Administered 2017-05-24: 10 [IU] via SUBCUTANEOUS
  Filled 2017-05-24: qty 0.1

## 2017-05-24 MED ORDER — GLUCOSE BLOOD VI STRP: ORAL_STRIP | 3 refills | Status: DC

## 2017-05-24 MED ORDER — INSULIN GLARGINE 100 UNIT/ML SOLOSTAR PEN: 15.0000 [IU] | PEN_INJECTOR | Freq: Two times a day (BID) | SUBCUTANEOUS | 0 refills | Status: DC

## 2017-05-24 MED FILL — NOVOLOG FLEXPEN SYRINGE: 100 | 33 days supply | Qty: 12 | Fill #2

## 2017-05-24 MED FILL — FERROUS SULFATE 325 MG TAB: 325 (65 FE) | 30 days supply | Qty: 60 | Fill #1

## 2017-05-24 MED FILL — GABAPENTIN 300 MG CAPSULE: 300 | 30 days supply | Qty: 60 | Fill #1

## 2017-05-24 MED FILL — ZENPEP 25000-79000 UNIT CAP: 25000-79000 | 33 days supply | Qty: 100 | Fill #1

## 2017-05-24 MED FILL — BASAGLAR 100 UNIT/ML KWIKPE: 100 | 34 days supply | Qty: 12 | Fill #2

## 2017-05-24 MED FILL — ATORVASTATIN 40 MG TABLET: 40 | 30 days supply | Qty: 30 | Fill #2

## 2017-05-24 NOTE — Discharge Summary (Addendum)
Physician Discharge Summary  Raymond ButteryGary Kosak [id number] DOB: [birth year] DOA: 05/20/2017  PCP: Jaclyn ShaggyAmao, [name], MD  Admit date: 05/20/2017 Discharge date: 05/23/2017  Time spent: 35 minutes  Recommendations for Outpatient Follow-up:  1. FU with Dr.Kumar for DM, FU, pt has an appt this week 2. [facility] 3. Home Health PT and RN set up  Discharge Diagnoses:  Principal Problem:   DKA (diabetic ketoacidoses) (HCC) Active Problems:   Tobacco abuse   Chronic kidney disease (CKD), stage II (mild)   Polysubstance abuse (HCC)   Non compliance  Discharge Condition: stable  Diet recommendation: diabetic  Filed Weights   05/20/17 1140  Weight: 44.9 kg (99 lb)    History of present illness:  56 year old male with diabetes on insulin, long history of noncompliance, polysubstance abuse and alcohol. Admitted with DKA and acute kidney injury, admits to poor compliance and extremely poorly motivated to change. CBGs fluctuating, some nausea and intermittently poor by mouth intake  Hospital Course:   DKA (diabetic ketoacidoses) (HCC) -Due to noncompliance -Corrected with insulin drip, IV fluids -Restarted Lantus, educated on compliance -According to Endocrine Dr.Kumar's recent note he is supposed to be on 10 units twice a day with meal correction -increased lantus to 15units BID and meal correction, his main problem is poor compliance, which I suspect will continue to be an issue -discharged home in a stable condition  Nausea and vomiting -Due to above, supportive care, PPI advance diet as tolerated    Polysubstance abuse (HCC)/tobacco abuse and alcohol abuse -Counseled, continue thiamine and multivitamins -no withdrawal noted    Acute kidney injury on chronic kidney disease stage II -Due to dehydration/DKA - improved -stopped IVF    Chronic anemia -Baseline hemoglobin is 9.5, hemoglobin was significantly hemoconcentrated on admission due to dehydration and DKA now  improved back to baseline, monitor -stable, defer to [facility] for workup  Noncompliance -main limiting factor    Discharge Exam: Vitals:   05/23/17 2133 05/24/17 0508  BP: (!) 100/59 107/63  Pulse: 85 93  Resp: 18 18  Temp:  98.3 F (36.8 C)  SpO2: 100% 100%    General: AAOx3 Cardiovascular: S1S2/RRR Respiratory: CTAB  Discharge Instructions   Discharge Instructions    Diet Carb Modified   Complete by:  As directed    Increase activity slowly   Complete by:  As directed      Allergies as of 05/24/2017      Reactions   Ativan [lorazepam] Other (See Comments)   HALLUCINATIONS      Medication List    STOP taking these medications   meloxicam 7.5 MG tablet Commonly known as:  MOBIC     TAKE these medications   ACCU-CHEK AVIVA device Use as instructed 3 times before meals   accu-chek softclix lancets Use as instructed 3 times before meals   aspirin EC 81 MG tablet Take 1 tablet (81 mg total) by mouth daily.   atorvastatin 40 MG tablet Commonly known as:  LIPITOR Take 1 tablet (40 mg total) by mouth daily.   ferrous sulfate 325 (65 FE) MG tablet Take 1 tablet (325 mg total) by mouth 2 (two) times daily with a meal.   gabapentin 300 MG capsule Commonly known as:  NEURONTIN TAKE 2 CAPSULE BY MOUTH AT BEDTIME FOR DIABETIC NERVE PAIN What changed:    how much to take  how to take this  when to take this  additional instructions   glucose  blood test strip Commonly known as:  ACCU-CHEK AVIVA Use as instructed 3 times before meals   insulin aspart 100 UNIT/ML FlexPen Commonly known as:  NOVOLOG FLEXPEN Inject 0-12 Units into the skin 3 (three) times daily with meals. 0-150 mg/dl give 0 units, 401151- [id number] give 2 units, 201-250 give 4 units, 251-300 give 6 units, 301-350 give 8 units, 351-400 give 10 units, greater than 400 give 12 units   Insulin Glargine 100 UNIT/ML Solostar Pen Commonly known as:  LANTUS  SOLOSTAR Inject 10 Units into the skin 2 (two) times daily. What changed:    how much to take  when to take this   Insulin Pen Needle 31G X 5 MM Misc Use 3 times daily before meals and at bedtime   mirtazapine 30 MG tablet Commonly known as:  REMERON TAKE 1 TABLET BY MOUTH AT BEDTIME   multivitamin with minerals tablet Take 1 tablet by mouth daily.   Pancrelipase (Lip-Prot-Amyl) 25000 units Cpep Commonly known as:  ZENPEP Take 25,000 Units by mouth 3 (three) times daily before meals.   polyethylene glycol packet Commonly known as:  MIRALAX / GLYCOLAX Take 17 g by mouth daily.      Allergies  Allergen Reactions  . Ativan [Lorazepam] Other (See Comments)    HALLUCINATIONS   Follow-up Information    Jaclyn ShaggyAmao, [name], MD. Schedule an appointment as soon as possible for a visit in 1 week(s).   Specialty:  Family Medicine Contact information: [address] [place] [address] [phone]        [facility] Follow [facility].   Specialty:  Home Health Services Why:  HHRN/PT arranged- they will call you to set up home visits Contact information: [address] [place] [address] [phone]            The results of significant diagnostics from this hospitalization (including imaging, microbiology, ancillary and laboratory) are listed below for reference.    Significant Diagnostic Studies: Dg Chest Portable 1 View  Result Date: 05/20/2017 CLINICAL DATA:  Initial evaluation for central line placement. EXAM: PORTABLE CHEST 1 VIEW COMPARISON:  A prior radiograph from earlier the same day. FINDINGS: Cardiac and mediastinal silhouettes are stable in size and contour, and remain within normal limits. Aortic atherosclerosis. There has been interval placement of a right IJ approach centra venous catheter with tip seen overlying the distal SVC. Lungs are well inflated. No pulmonary edema or fluid fusion. No focal infiltrates. No pneumothorax.  Osseous structures unchanged. IMPRESSION: 1. Interval placement of right IJ approach centra venous catheter with tip overlying the distal SVC. No complication. 2. No new active cardiopulmonary disease. Electronically Signed   By: Rise MuBenjamin  McClintock M.D.   On: 05/20/2017 16:20   Dg Chest Port 1 View  Result Date: 05/20/2017 CLINICAL DATA:  Shortness of breath 2 weeks. Nausea and vomiting today. EXAM: PORTABLE CHEST 1 VIEW COMPARISON:  Two-view chest x-ray 02/20/2017. FINDINGS: The heart size and mediastinal contours are within normal limits. Both lungs are clear. The visualized skeletal structures are unremarkable. IMPRESSION: Negative two view chest x-ray Electronically Signed   By: Marin Robertshristopher  Mattern M.D.   On: 05/20/2017 12:49    Microbiology: No results found for this or any previous visit (from the past 240 hour(s)).   Labs: Basic Metabolic Panel: Recent Labs  Lab 05/20/17 1755 05/20/17 2115 05/21/17 0027 05/21/17 0504 05/22/17 0428  NA 139 142 141 138 142  K 3.7 3.4* 3.1* 3.5 3.1*  CL 98* 106  108 108 110  CO2 14* 19* 24 25 27   GLUCOSE 322* 182* 143* 85 35*  BUN 39* 34* 31* 27* 18  CREATININE 2.98* 2.49* 2.00* 1.78* 1.37*  CALCIUM 9.2 8.4* 8.0* 8.2* 8.6*  MG 2.3  --   --   --   --    Liver Function Tests: Recent Labs  Lab 05/20/17 1211  AST 49*  ALT 45  ALKPHOS 193*  BILITOT 2.2*  PROT 8.0  ALBUMIN 3.5   No results for input(s): LIPASE, AMYLASE in the last 168 hours. No results for input(s): AMMONIA in the last 168 hours. CBC: Recent Labs  Lab 05/20/17 1211 05/20/17 1227 05/21/17 0504 05/21/17 1852 05/22/17 0428  WBC 8.9  --  9.9  --  7.5  NEUTROABS 7.5  --   --   --   --   HGB 14.2 16.3 9.8* 8.6* 9.4*  HCT 43.0 48.0 29.1* 24.7* 27.5*  MCV 80.8  --  77.2*  --  78.1  PLT 244  --  165  --  121*   Cardiac Enzymes: No results for input(s): CKTOTAL, CKMB, CKMBINDEX, TROPONINI in the last 168 hours. BNP: BNP (last 3 results) No results for input(s): BNP  in the last 8760 hours.  ProBNP (last 3 results) No results for input(s): PROBNP in the last 8760 hours.  CBG: Recent Labs  Lab 05/23/17 0809 05/23/17 1237 05/23/17 1623 05/23/17 2131 05/24/17 0022  GLUCAP 207* 141* 215* 413* 317*       Signed:  Zannie Cove MD.  [facility] 05/24/2017, 7:37 AM

## 2017-05-24 NOTE — Care Management Important Message (Signed)
Important Message  Patient Details  Name: Raymond ButteryGary Winters MRN: [id number] Date of Birth: [birth year]   Medicare Important Message Given:  Yes    [name] 05/24/2017, 10:53 AM

## 2017-05-24 NOTE — Telephone Encounter (Signed)
Called patient and was not able to leave a message. I have sent both the meter and the strips to the pharmacy he requested.

## 2017-05-24 NOTE — Progress Notes (Signed)
Patient discharge teaching given reviewed again with patient and sister, including activity, diet, follow-up appoints, and medications. Patient verbalized understanding of all discharge instructions. Vitals are stable. Skin is intact except as charted in most recent assessments. Pt to be escorted out by NT, to be driven home by family.

## 2017-05-24 NOTE — Telephone Encounter (Signed)
Patient need the One touch verio meter and test strips flex   send to  [facility] [phone] [place] - [address] AT [facility] [address] DEA #:  [id number]

## 2017-05-25 MED FILL — MIRTAZAPINE 15 MG TAB: 15 | 30 days supply | Qty: 30 | Fill #0

## 2017-05-26 ENCOUNTER — Ambulatory Visit: Payer: Medicare Other | Attending: Internal Medicine | Admitting: Licensed Clinical Social Worker

## 2017-05-26 ENCOUNTER — Encounter: Payer: Self-pay | Admitting: Family Medicine

## 2017-05-26 ENCOUNTER — Ambulatory Visit: Payer: Medicare Other | Attending: Family Medicine | Admitting: Family Medicine

## 2017-05-26 VITALS — BP 133/80 | HR 116 | Temp 98.2°F | Ht 67.0 in | Wt 119.0 lb

## 2017-05-26 DIAGNOSIS — B1920 Unspecified viral hepatitis C without hepatic coma: Secondary | ICD-10-CM | POA: Diagnosis not present

## 2017-05-26 DIAGNOSIS — Z9114 Patient's other noncompliance with medication regimen: Secondary | ICD-10-CM | POA: Insufficient documentation

## 2017-05-26 DIAGNOSIS — Z9119 Patient's noncompliance with other medical treatment and regimen: Secondary | ICD-10-CM | POA: Insufficient documentation

## 2017-05-26 DIAGNOSIS — Z794 Long term (current) use of insulin: Secondary | ICD-10-CM | POA: Diagnosis not present

## 2017-05-26 DIAGNOSIS — Z888 Allergy status to other drugs, medicaments and biological substances status: Secondary | ICD-10-CM | POA: Diagnosis not present

## 2017-05-26 DIAGNOSIS — K529 Noninfective gastroenteritis and colitis, unspecified: Secondary | ICD-10-CM

## 2017-05-26 DIAGNOSIS — E1142 Type 2 diabetes mellitus with diabetic polyneuropathy: Secondary | ICD-10-CM | POA: Diagnosis not present

## 2017-05-26 DIAGNOSIS — E7800 Pure hypercholesterolemia, unspecified: Secondary | ICD-10-CM | POA: Diagnosis not present

## 2017-05-26 DIAGNOSIS — Z9889 Other specified postprocedural states: Secondary | ICD-10-CM | POA: Insufficient documentation

## 2017-05-26 DIAGNOSIS — Z79899 Other long term (current) drug therapy: Secondary | ICD-10-CM | POA: Insufficient documentation

## 2017-05-26 DIAGNOSIS — F1020 Alcohol dependence, uncomplicated: Secondary | ICD-10-CM | POA: Insufficient documentation

## 2017-05-26 DIAGNOSIS — F329 Major depressive disorder, single episode, unspecified: Secondary | ICD-10-CM | POA: Insufficient documentation

## 2017-05-26 DIAGNOSIS — F1110 Opioid abuse, uncomplicated: Secondary | ICD-10-CM

## 2017-05-26 DIAGNOSIS — E1165 Type 2 diabetes mellitus with hyperglycemia: Secondary | ICD-10-CM | POA: Diagnosis present

## 2017-05-26 DIAGNOSIS — Z7982 Long term (current) use of aspirin: Secondary | ICD-10-CM | POA: Insufficient documentation

## 2017-05-26 DIAGNOSIS — E118 Type 2 diabetes mellitus with unspecified complications: Secondary | ICD-10-CM

## 2017-05-26 DIAGNOSIS — K860 Alcohol-induced chronic pancreatitis: Secondary | ICD-10-CM | POA: Insufficient documentation

## 2017-05-26 LAB — GLUCOSE, POCT (MANUAL RESULT ENTRY): POC Glucose: 137 mg/dl — AB (ref 70–99)

## 2017-05-26 LAB — POCT GLYCOSYLATED HEMOGLOBIN (HGB A1C): Hemoglobin A1C: 13.2

## 2017-05-26 MED ORDER — MIRTAZAPINE 30 MG PO TABS: 30.0000 mg | ORAL_TABLET | Freq: Every day | ORAL | 5 refills | Status: DC

## 2017-05-26 MED ORDER — ATORVASTATIN CALCIUM 40 MG PO TABS: 40.0000 mg | ORAL_TABLET | Freq: Every day | ORAL | 5 refills | Status: DC

## 2017-05-26 MED ORDER — GABAPENTIN 300 MG PO CAPS: ORAL_CAPSULE | ORAL | 5 refills | Status: DC

## 2017-05-26 NOTE — BH Specialist Note (Signed)
Integrated Behavioral Health Initial Visit  MRN: [id number] Name: Larinda ButteryGary Vaness  Number of Integrated Behavioral Health Clinician visits:: 1/6 Session Start time: 2:50  Session End time: 3:10 PM Total time: 20 minutes  Type of Service: Integrated Behavioral Health- Individual/Family Interpretor:No. Interpretor Name and Language: N/A   Warm Hand Off Completed.       SUBJECTIVE: Larinda ButteryGary Neyman is a 56 y.o. male accompanied by Sibling Patient was referred by Dr. Venetia NightAmao for depression, anxiety, and resources for personal care. Patient reports the following symptoms/concerns: Pt reports difficulty managing diabetes. He shared needing assistance with checking his blood sugars on a daily basis Duration of problem: Ongoing; Severity of problem: moderate  OBJECTIVE: Mood: Anxious and Irritable and Affect: Appropriate Risk of harm to self or others: No plan to harm self or others  LIFE CONTEXT: Family and Social: Pt is currently residing with sister, who accompanied pt at appointment. He has a brother who resides locally School/Work: Pt receives disability ($500) Self-Care: Pt has hx of ongoing substance use Life Changes: Pt was recently hospitalized from 05/20/17 through 05/23/17 for diabetic ketoacidosis. He has difficulty managing ongoing medical conditions  GOALS ADDRESSED: Patient will: 1. Reduce symptoms of: anxiety and depression 2. Increase knowledge and/or ability of: coping skills  3. Demonstrate ability to: Increase adequate support systems for patient/family and Decrease self-medicating behaviors  INTERVENTIONS: Interventions utilized: Supportive Counseling and Psychoeducation and/or Health Education  Standardized Assessments completed: GAD-7 and PHQ 2&9  ASSESSMENT: Patient currently experiencing depression and anxiety triggered by ongoing medical concerns and substance use. Pt reports difficulty managing diabetes. He shared needing assistance with checking his blood sugars on a daily  basis. He has support of his family; however, sister reports caregiver stress. Supportive resources were provided.    Patient may benefit from psychotherapy and substance use treatment. LCSWA discussed correlation between physical and mental health, in addition, to how substance use can negatively impact both. Pt identified coping skills that he can utilize to decrease symptoms. He is not interested in initiating therapy or substance use treatment. Pt refused referral to [facility] stating he only needs assistance with checking blood sugars.   PLAN: 1. Follow up with behavioral health clinician on : Pt was encouraged to contact LCSWA if symptoms worsen or fail to improve to schedule [facility] appointments at [facility] 2. Behavioral recommendations: LCSWA recommends that pt apply healthy coping skills discussed. Pt is encouraged to schedule follow up appointment with LCSWA 3. Referral(s): NA 4. "From scale of 1-10, how likely are you to follow plan?":   Bridgett LarssonJasmine D Lewis, LCSW 05/27/17 4:12 PM

## 2017-05-26 NOTE — Progress Notes (Signed)
Subjective:  Patient ID: Raymond Winters, male    DOB: 1961-01-30  Age: 56 y.o. MRN: [id number]  CC: Diabetes   HPI Raymond Winters is a 56 year old male with history of type 2 diabetes mellitus (A1c 13.2), diabetic neuropathy, chronic alcoholic pancreatitis, substance abuse, noncompliance who presents today after hospitalization at [facility] from 05/20/17 through 05/23/17 for diabetic ketoacidosis.  He was commenced on IV fluids, insulin drip and antiemetics; he also had acute renal insufficiency which  responded to hydration. His Lantus was increased from 10 units twice daily to 15 units twice daily and he was subsequently discharged once he was stabilized.  He presents today accompanied by his sister who is requesting the patient be placed in a skilled nursing facility as he has poor motivation and has been nonadherent with his medications.  He does have hyperglycemia with blood sugars in the 300s and at other times has low blood sugars in the 50-60 range. He has chronic diarrhea with his meals and has to use depends. She is fatigued due to caring for her brother. The patient appears to be nonchalant and informs me that he takes his NovoLog first and depending on his sugar then takes his Lantus. He has an upcoming appointment with his endocrinologist-Dr. Dwyane Dee.  He is active polysubstance use and is not willing to quit and is not open to rehab either.  Past Medical History:  Diagnosis Date  . Alcohol dependence ([place]) 01/05/2016  . Alcoholic liver disease, unspecified ([place]) 01/05/2016  . Arthritis    "lower extremeties, hands, shoulders, back" (08/20/2016)  . Chronic alcoholic pancreatitis ([place]) 01/05/2016  . Depression   . Family history of adverse reaction to anesthesia    pt's sister has hx. of post-op N/V  . Hepatitis C    "never treated" (08/20/2016)  . Heroin abuse ([place])   . High cholesterol   . Hypoalbuminemia due to protein-calorie malnutrition ([place]) 01/05/2016  . Insulin dependent  diabetes mellitus ([place])   . Lactic acid increased, CHRONIC 01/05/2016   Secondary to Liver Disease, Alcoholism, and Diabetes  . 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 Shoulder wound 01/2016   left    Past Surgical History:  Procedure Laterality Date  . APPLICATION OF WOUND VAC Left 12/24/2015   Procedure: APPLICATION OF WOUND VAC; LEFT SHOULDER;  Surgeon: Leandrew Koyanagi, MD;  Location: [place];  Service: Orthopedics;  Laterality: Left;  . APPLICATION OF WOUND VAC Left 01/29/2016   Procedure: LEFT SHOULDER SPLIT THICKNESS SKIN GRAFT, WOUND VAC;  Surgeon: Leandrew Koyanagi, MD;  Location: [place];  Service: Orthopedics;  Laterality: Left;  . I&D EXTREMITY Right 03/20/2013   Procedure: IRRIGATION AND DEBRIDEMENT EXTREMITY;  Surgeon: Schuyler Amor, MD;  Location: [place];  Service: Orthopedics;  Laterality: Right;  . I&D EXTREMITY Left 12/24/2015   Procedure:  DEBRIDEMENT OF MUSCLE, SUBCUTANEOUS TISSUE LEFT SHOULDER;  Surgeon: Leandrew Koyanagi, MD;  Location: [place];  Service: Orthopedics;  Laterality: Left;  . I&D EXTREMITY Right 12/18/2016   Procedure: INCISION AND DRAINAGE ANKLE ABSCESS;  Surgeon: Renette Butters, MD;  Location: [place];  Service: Orthopedics;  Laterality: Right;  . INCISION AND DRAINAGE ABSCESS Right 08/19/2016   Procedure: DEBRIDEMENT RIGHT BUTTOCK  ABSCESS;  Surgeon: Donnie Mesa, MD;  Location: [place];  Service: General;  Laterality: Right;  . IRRIGATION AND DEBRIDEMENT ABSCESS Right 08/19/2016   buttocks  . IRRIGATION AND DEBRIDEMENT SHOULDER Left 12/27/2015   Procedure:  IRRIGATION AND DEBRIDEMENT LEFT SHOULDER;  Surgeon: Naiping M Xu, MD;  [facility];  Service: Orthopedics;  Laterality: Left;  . IRRIGATION AND DEBRIDEMENT SHOULDER Left 12/30/2015   Procedure: IRRIGATION AND DEBRIDEMENT LEFT SHOULDER;  Surgeon: Naiping M Xu, MD;  Location: [facility];  Service: Orthopedics;  Laterality: Left;  . SKIN SPLIT GRAFT Left 01/29/2016   Procedure: SKIN  GRAFT SPLIT THICKNESS,;  Surgeon: Naiping M Xu, MD;  Location: [facility];  Service: Orthopedics;  Laterality: Left;    Allergies  Allergen Reactions  . Ativan [Lorazepam] Other (See Comments)    HALLUCINATIONS     Outpatient Medications Prior to Visit  Medication Sig Dispense Refill  . aspirin EC 81 MG tablet Take 1 tablet (81 mg total) by mouth daily. 30 tablet 5  . Blood Glucose Monitoring Suppl (ACCU-CHEK AVIVA) device Use as instructed 3 times before meals 1 each 0  . Blood Glucose Monitoring Suppl (ONETOUCH VERIO FLEX SYSTEM) w/Device KIT 1 Device by Other route 3 (three) times daily. Use as instructed to check blood sugars 3 times daily before meals 1 kit 1  . ferrous sulfate 325 (65 FE) MG tablet Take 1 tablet (325 mg total) by mouth 2 (two) times daily with a meal. 60 tablet 3  . glucose blood (ACCU-CHEK AVIVA) test strip Use as instructed 3 times before meals 100 each 12  . glucose blood (ONETOUCH VERIO) test strip Use as instructed 3 times before meals. 100 each 3  . insulin aspart (NOVOLOG FLEXPEN) 100 UNIT/ML FlexPen Inject 0-12 Units into the skin 3 (three) times daily with meals. 0-150 mg/dl give 0 units, 151- 200s give 2 units, 201-250 give 4 units, 251-300 give 6 units, 301-350 give 8 units, 351-400 give 10 units, greater than 400 give 12 units 15 mL 0  . Insulin Glargine (LANTUS SOLOSTAR) 100 UNIT/ML Solostar Pen Inject 15 Units into the skin 2 (two) times daily. 15 mL 0  . Insulin Pen Needle 31G X 5 MM MISC Use 3 times daily before meals and at bedtime 120 each 5  . Lancet Devices (ACCU-CHEK SOFTCLIX) lancets Use as instructed 3 times before meals 1 each 5  . Pancrelipase, Lip-Prot-Amyl, (ZENPEP) 25000 units CPEP Take 25,000 Units by mouth 3 (three) times daily before meals. 90 capsule 3  . polyethylene glycol (MIRALAX / GLYCOLAX) packet Take 17 g by mouth daily. 14 each 0  . atorvastatin (LIPITOR) 40 MG tablet Take 1 tablet (40 mg total) by mouth daily. 30  tablet 5  . gabapentin (NEURONTIN) 300 MG capsule TAKE 2 CAPSULE BY MOUTH AT BEDTIME FOR DIABETIC NERVE PAIN (Patient taking differently: Take 600 mg by mouth at bedtime. FOR DIABETIC NERVE PAIN) 120 capsule 5  . mirtazapine (REMERON) 30 MG tablet TAKE 1 TABLET BY MOUTH AT BEDTIME 30 tablet 0  . Multiple Vitamins-Minerals (MULTIVITAMIN WITH MINERALS) tablet Take 1 tablet by mouth daily.    . mirtazapine (REMERON) 15 MG tablet TAKE 1 TABLET BY MOUTH AT BEDTIME. (Patient not taking: Reported on 05/26/2017) 30 tablet 2   No facility-administered medications prior to visit.     ROS Review of Systems  Constitutional: Negative for activity change and appetite change.  HENT: Negative for sinus pressure and sore throat.   Eyes: Negative for visual disturbance.  Respiratory: Negative for cough, chest tightness and shortness of breath.   Cardiovascular: Negative for chest pain and leg swelling.  Gastrointestinal: Positive for diarrhea. Negative for abdominal distention, abdominal pain and constipation.  Endocrine:   Negative.   Genitourinary: Negative for dysuria.  Musculoskeletal: Negative for joint swelling and myalgias.  Skin: Negative for rash.  Allergic/Immunologic: Negative.   Neurological: Negative for weakness, light-headedness and numbness.  Psychiatric/Behavioral: Negative for dysphoric mood and suicidal ideas.    Objective:  BP 133/80   Pulse (!) 116   Temp 98.2 F (36.8 C) (Oral)   Ht 5' 7" (1.702 m)   Wt 119 lb (54 kg)   SpO2 100%   BMI 18.64 kg/m   BP/Weight 05/26/2017 05/24/2017 05/20/2017  Systolic BP 133 107 -  Diastolic BP 80 63 -  Wt. (Lbs) 119 - 99  BMI 18.64 - 15.51      Physical Exam  Constitutional: He is oriented to person, place, and time. He appears well-developed and well-nourished.  Cardiovascular: Normal rate, normal heart sounds and intact distal pulses.  No murmur heard. Pulmonary/Chest: Effort normal and breath sounds normal. He has no wheezes. He  has no rales. He exhibits no tenderness.  Abdominal: Soft. Bowel sounds are normal. He exhibits no distension and no mass. There is no tenderness.  Musculoskeletal: Normal range of motion.  Neurological: He is alert and oriented to person, place, and time.  Skin: Skin is warm and dry.  Psychiatric: He has a normal mood and affect.    Lab Results  Component Value Date   HGBA1C 13.2 05/26/2017     Assessment & Plan:   1. Type 2 diabetes mellitus with complication, without long-term current use of insulin (HCC) Uncontrolled with A1c of 13.2 He has been highly noncompliant and his blood sugars have been labile We will apply for home health services given he has difficulties with administration of his medication, poor motivation and his sister is largely responsible however caregiver stress is playing a role. Educated on proper administration of Lantus and NovoLog; Lantus is long-acting and NovoLog is short acting; Lantus should be taken regardless and then his NovoLog should be used according to sliding scale Follow-up with his endocrinologist- Dr Kumar - POCT glucose (manual entry) - POCT glycosylated hemoglobin (Hb A1C) - atorvastatin (LIPITOR) 40 MG tablet; Take 1 tablet (40 mg total) by mouth daily.  Dispense: 30 tablet; Refill: 5  2. Diabetic polyneuropathy associated with type 2 diabetes mellitus (HCC) Stable - gabapentin (NEURONTIN) 300 MG capsule; TAKE 2 CAPSULE BY MOUTH AT BEDTIME FOR DIABETIC NERVE PAIN  Dispense: 120 capsule; Refill: 5  3. Chronic alcoholic pancreatitis (HCC) Currently on Zenpep - Ambulatory referral to Gastroenterology  4. Heroin abuse (HCC) Discussed rehab and patient is not open to it at this time; states he has been to rehab several times all to no avail.  5. Chronic diarrhea Likely secondary to pancreatic insufficiency Will refer to GI for optimization - Ambulatory referral to Gastroenterology   Meds ordered this encounter  Medications  .  atorvastatin (LIPITOR) 40 MG tablet    Sig: Take 1 tablet (40 mg total) by mouth daily.    Dispense:  30 tablet    Refill:  5  . gabapentin (NEURONTIN) 300 MG capsule    Sig: TAKE 2 CAPSULE BY MOUTH AT BEDTIME FOR DIABETIC NERVE PAIN    Dispense:  120 capsule    Refill:  5    Decrease previous dose  . mirtazapine (REMERON) 30 MG tablet    Sig: Take 1 tablet (30 mg total) by mouth at bedtime.    Dispense:  30 tablet    Refill:  5    Discontinue previous dose      Follow-up: Return in about 3 months (around 08/24/2017) for Follow-up of diabetes and pancreatitis.   Arnoldo Morale MD

## 2017-05-26 NOTE — Patient Instructions (Signed)
Diabetes Mellitus and Food It is important for you to manage your blood sugar (glucose) level. Your blood glucose level can be greatly affected by what you eat. Eating healthier foods in the appropriate amounts throughout the day at about the same time each day will help you control your blood glucose level. It can also help slow or prevent worsening of your diabetes mellitus. Healthy eating may even help you improve the level of your blood pressure and reach or maintain a healthy weight. General recommendations for healthful eating and cooking habits include:  Eating meals and snacks regularly. Avoid going long periods of time without eating to lose weight.  Eating a diet that consists mainly of plant-based foods, such as fruits, vegetables, nuts, legumes, and whole grains.  Using low-heat cooking methods, such as baking, instead of high-heat cooking methods, such as deep frying.  Work with your dietitian to make sure you understand how to use the Nutrition Facts information on food labels. How can food affect me? Carbohydrates Carbohydrates affect your blood glucose level more than any other type of food. Your dietitian will help you determine how many carbohydrates to eat at each meal and teach you how to count carbohydrates. Counting carbohydrates is important to keep your blood glucose at a healthy level, especially if you are using insulin or taking certain medicines for diabetes mellitus. Alcohol Alcohol can cause sudden decreases in blood glucose (hypoglycemia), especially if you use insulin or take certain medicines for diabetes mellitus. Hypoglycemia can be a life-threatening condition. Symptoms of hypoglycemia (sleepiness, dizziness, and disorientation) are similar to symptoms of having too much alcohol. If your health care provider has given you approval to drink alcohol, do so in moderation and use the following guidelines:  Women should not have more than one drink per day, and men  should not have more than two drinks per day. One drink is equal to: ? 12 oz of beer. ? 5 oz of wine. ? 1 oz of hard liquor.  Do not drink on an empty stomach.  Keep yourself hydrated. Have water, diet soda, or unsweetened iced tea.  Regular soda, juice, and other mixers might contain a lot of carbohydrates and should be counted.  What foods are not recommended? As you make food choices, it is important to remember that all foods are not the same. Some foods have fewer nutrients per serving than other foods, even though they might have the same number of calories or carbohydrates. It is difficult to get your body what it needs when you eat foods with fewer nutrients. Examples of foods that you should avoid that are high in calories and carbohydrates but low in nutrients include:  Trans fats (most processed foods list trans fats on the Nutrition Facts label).  Regular soda.  Juice.  Candy.  Sweets, such as cake, pie, doughnuts, and cookies.  Fried foods.  What foods can I eat? Eat nutrient-rich foods, which will nourish your body and keep you healthy. The food you should eat also will depend on several factors, including:  The calories you need.  The medicines you take.  Your weight.  Your blood glucose level.  Your blood pressure level.  Your cholesterol level.  You should eat a variety of foods, including:  Protein. ? Lean cuts of meat. ? Proteins low in saturated fats, such as fish, egg whites, and beans. Avoid processed meats.  Fruits and vegetables. ? Fruits and vegetables that may help control blood glucose levels, such as apples,   mangoes, and yams.  Dairy products. ? Choose fat-free or low-fat dairy products, such as milk, yogurt, and cheese.  Grains, bread, pasta, and rice. ? Choose whole grain products, such as multigrain bread, whole oats, and brown rice. These foods may help control blood pressure.  Fats. ? Foods containing healthful fats, such as  nuts, avocado, olive oil, canola oil, and fish.  Does everyone with diabetes mellitus have the same meal plan? Because every person with diabetes mellitus is different, there is not one meal plan that works for everyone. It is very important that you meet with a dietitian who will help you create a meal plan that is just right for you. This information is not intended to replace advice given to you by your health care provider. Make sure you discuss any questions you have with your health care provider. Document Released: 03/05/2005 Document Revised: 11/14/2015 Document Reviewed: 05/05/2013 Elsevier Interactive Patient Education  2017 [employer]  

## 2017-05-27 ENCOUNTER — Encounter: Payer: Self-pay | Admitting: Family Medicine

## 2017-05-30 ENCOUNTER — Other Ambulatory Visit: Payer: Self-pay | Admitting: Endocrinology

## 2017-05-30 DIAGNOSIS — Z794 Long term (current) use of insulin: Principal | ICD-10-CM

## 2017-05-30 DIAGNOSIS — E1165 Type 2 diabetes mellitus with hyperglycemia: Secondary | ICD-10-CM

## 2017-06-01 ENCOUNTER — Other Ambulatory Visit: Payer: Medicare Other

## 2017-06-04 ENCOUNTER — Encounter: Payer: Self-pay | Admitting: Endocrinology

## 2017-06-04 ENCOUNTER — Telehealth: Payer: Self-pay | Admitting: Family Medicine

## 2017-06-04 ENCOUNTER — Ambulatory Visit (INDEPENDENT_AMBULATORY_CARE_PROVIDER_SITE_OTHER): Payer: Medicare Other | Admitting: Endocrinology

## 2017-06-04 VITALS — BP 98/56 | HR 128 | Ht 67.0 in | Wt 128.0 lb

## 2017-06-04 DIAGNOSIS — Z794 Long term (current) use of insulin: Secondary | ICD-10-CM

## 2017-06-04 DIAGNOSIS — E1165 Type 2 diabetes mellitus with hyperglycemia: Secondary | ICD-10-CM | POA: Diagnosis not present

## 2017-06-04 LAB — BASIC METABOLIC PANEL
BUN: 14 mg/dL (ref 6–23)
CO2: 26 meq/L (ref 19–32)
Calcium: 8.2 mg/dL — ABNORMAL LOW (ref 8.4–10.5)
Chloride: 111 mEq/L (ref 96–112)
Creatinine, Ser: 1.12 mg/dL (ref 0.40–1.50)
GFR: 86.53 mL/min (ref >60.00)
GLUCOSE: 107 mg/dL — AB (ref 70–99)
Potassium: 4.5 mEq/L (ref 3.5–5.1)
SODIUM: 142 meq/L (ref 135–145)

## 2017-06-04 NOTE — Telephone Encounter (Signed)
Call placed to patient on both house and cell number. Spoke with his sister both times and she informed me that patient is not with her at the moment. He is at his brother's house but she will give him my message and have him return my call.   Wanted to confirm with patient if he would like home health services and if he had an agency of choice.

## 2017-06-04 NOTE — Progress Notes (Signed)
Patient ID: Raymond Winters, male   DOB: 1960/10/31, 56 y.o.   MRN: [id number]          Reason for Appointment: follow-up for Type 2 Diabetes  Referring physician: None   History of Present Illness:          Date of diagnosis of type 2 diabetes mellitus:  1994      Background history:   He thinks he was tried on metformin at the time of diagnosis.  He does not think his weight was excessive at the time of diagnosis He was probably started on insulin within a few years of his diagnosis because of poor control His blood sugar control has always been poor with A1c History indicating high levels except for twice when his A1c was below 8% He has been on various insulin regimens in the past Earlier this year when he was incarcerated he was taking 70/30 insulin and with this he was getting periodic hypoglycemia After his ER visit he was changed to Lantus and NovoLog  Recent history:   INSULIN regimen is:  10 Lantus bid, NovoLog when necessary      Current management, blood sugar patterns and problems identified:  He was told to take NovoLog consistently before every meal starting with 3-4 units but he is not doing so for fear of hypoglycemia  He is sometimes taking Novolog based on his blood sugar level but he thinks he has taken this only occasionally and does not know how much  His friend tells [place] that he is up all night and eating constantly all night  causing most of his morning sugars to be significantly high  He has had low blood sugars twice around 8:30 AM preceded by very high blood sugars but does not know how much NovoLog he would have taken, he has very poor recollection of what he is doing  With his blood sugar going down to 27 he had decreased mentation and EMS was called  The last couple of days his fasting blood sugars have been around 140-150  and also blood sugars before supper have been below 140 at least  He does not want to take NovoLog because of fear of hypoglycemia and  has not taken it since his low sugar episode; however again he does not know how much he is taking; he is supposed to take 12 units for blood sugars over 400  He was supposed to see the dietitian and nurse educator but they have been unable to contact him for appointment        Side effects from medications have been: None  Compliance with the medical regimen: Inadequate Hypoglycemia:   as above recently  Glucose monitoring:     Mean values apply above for all meters except median for One Touch  PRE-MEAL Fasting Lunch Dinner Overnight  Overall  Glucose range:  1 42-391   77-312   220-406    Mean/median:    340  208+/-134    POST-MEAL PC Breakfast PC Lunch PC Dinner  Glucose range:  27-277     Mean/median:        Self-care: The diet that the patient has been following is: tries to limit sweets and sweet drinks .       Typical meal intake: Breakfast is late morning, eggs/toast or cereal; dinner: fried chicken sometimes,  snacks at times              Dietician visit, most recent: never  Exercise:  minimal because of fatigue  Weight history:  Wt Readings from Last 3 Encounters:  06/04/17 128 lb (58.1 kg)  05/26/17 119 lb (54 kg)  05/20/17 99 lb (44.9 kg)    Glycemic control:   Lab Results  Component Value Date   HGBA1C 13.2 05/26/2017   HGBA1C 13.1 05/06/2017   HGBA1C 13.2 12/08/2016   Lab Results  Component Value Date   MICROALBUR <0.7 05/06/2017   LDLCALC 48 05/06/2017   CREATININE 1.12 06/04/2017   Lab Results  Component Value Date   MICRALBCREAT 1.3 05/06/2017    No results found for: FRUCTOSAMINE    Allergies as of 06/04/2017      Reactions   Ativan [lorazepam] Other (See Comments)   HALLUCINATIONS      Medication List        Accurate as of 06/04/17  4:39 PM. Always use your most recent med list.          accu-chek softclix lancets Use as instructed 3 times before meals   aspirin EC 81 MG tablet Take 1 tablet (81 mg total)  by mouth daily.   atorvastatin 40 MG tablet Commonly known as:  LIPITOR Take 1 tablet (40 mg total) by mouth daily.   ferrous sulfate 325 (65 FE) MG tablet Take 1 tablet (325 mg total) by mouth 2 (two) times daily with a meal.   gabapentin 300 MG capsule Commonly known as:  NEURONTIN TAKE 2 CAPSULE BY MOUTH AT BEDTIME FOR DIABETIC NERVE PAIN   glucose blood test strip Commonly known as:  ONETOUCH VERIO Use as instructed 3 times before meals.   Insulin Glargine 100 UNIT/ML Solostar Pen Commonly known as:  LANTUS SOLOSTAR Inject 15 Units into the skin 2 (two) times daily.   Insulin Pen Needle 31G X 5 MM Misc Use 3 times daily before meals and at bedtime   mirtazapine 30 MG tablet Commonly known as:  REMERON Take 1 tablet (30 mg total) by mouth at bedtime.   multivitamin with minerals tablet Take 1 tablet by mouth daily.   [place] w/Device Kit 1 Device by Other route 3 (three) times daily. Use as instructed to check blood sugars 3 times daily before meals   Pancrelipase (Lip-Prot-Amyl) 25000 units Cpep Commonly known as:  ZENPEP Take 25,000 Units by mouth 3 (three) times daily before meals.   polyethylene glycol packet Commonly known as:  MIRALAX / GLYCOLAX Take 17 g by mouth daily.       Allergies:  Allergies  Allergen Reactions  . Ativan [Lorazepam] Other (See Comments)    HALLUCINATIONS    Past Medical History:  Diagnosis Date  . Alcohol dependence ([place]) 01/05/2016  . Alcoholic liver disease, unspecified ([place]) 01/05/2016  . Arthritis    "lower extremeties, hands, shoulders, back" (08/20/2016)  . Chronic alcoholic pancreatitis ([place]) 01/05/2016  . Depression   . Family history of adverse reaction to anesthesia    pt's sister has hx. of post-op N/V  . Hepatitis C    "never treated" (08/20/2016)  . Heroin abuse ([place])   . High cholesterol   . Hypoalbuminemia due to protein-calorie malnutrition ([place]) 01/05/2016  . Insulin dependent diabetes  mellitus ([place])   . Lactic acid increased, CHRONIC 01/05/2016   Secondary to Liver Disease, Alcoholism, and Diabetes  . 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 Shoulder wound 01/2016   left    Past Surgical History:  Procedure Laterality Date  . APPLICATION  OF WOUND VAC Left 12/24/2015   Procedure: APPLICATION OF WOUND VAC; LEFT SHOULDER;  Surgeon: Leandrew Koyanagi, MD;  Location: [facility];  Service: Orthopedics;  Laterality: Left;  . APPLICATION OF WOUND VAC Left 01/29/2016   Procedure: LEFT SHOULDER SPLIT THICKNESS SKIN GRAFT, WOUND VAC;  Surgeon: Leandrew Koyanagi, MD;  Location: [facility];  Service: Orthopedics;  Laterality: Left;  . I&D EXTREMITY Right 03/20/2013   Procedure: IRRIGATION AND DEBRIDEMENT EXTREMITY;  Surgeon: Schuyler Amor, MD;  Location: [facility];  Service: Orthopedics;  Laterality: Right;  . I&D EXTREMITY Left 12/24/2015   Procedure:  DEBRIDEMENT OF MUSCLE, SUBCUTANEOUS TISSUE LEFT SHOULDER;  Surgeon: Leandrew Koyanagi, MD;  Location: [place] [facility];  Service: Orthopedics;  Laterality: Left;  . I&D EXTREMITY Right 12/18/2016   Procedure: INCISION AND DRAINAGE ANKLE ABSCESS;  Surgeon: Renette Butters, MD;  Location: [facility];  Service: Orthopedics;  Laterality: Right;  . INCISION AND DRAINAGE ABSCESS Right 08/19/2016   Procedure: DEBRIDEMENT RIGHT BUTTOCK  ABSCESS;  Surgeon: Donnie Mesa, MD;  Location: [facility];  Service: General;  Laterality: Right;  . IRRIGATION AND DEBRIDEMENT ABSCESS Right 08/19/2016   buttocks  . IRRIGATION AND DEBRIDEMENT SHOULDER Left 12/27/2015   Procedure: IRRIGATION AND DEBRIDEMENT LEFT SHOULDER;  Surgeon: Leandrew Koyanagi, MD;  Location: [facility];  Service: Orthopedics;  Laterality: Left;  . IRRIGATION AND DEBRIDEMENT SHOULDER Left 12/30/2015   Procedure: IRRIGATION AND DEBRIDEMENT LEFT SHOULDER;  Surgeon: Leandrew Koyanagi, MD;  Location: [facility];  Service: Orthopedics;  Laterality: Left;  . SKIN SPLIT GRAFT Left 01/29/2016   Procedure: SKIN GRAFT SPLIT  THICKNESS,;  Surgeon: Leandrew Koyanagi, MD;  Location: [facility];  Service: Orthopedics;  Laterality: Left;    Family History  Problem Relation Age of Onset  . Diabetes Mother   . Heart disease Mother   . Hypertension Mother   . Stroke Mother   . Vision loss Mother   . Heart disease Father   . Hypertension Father   . Anesthesia problems Sister        post-op N/V  . Diabetes Sister     Social History:  reports that he has been smoking cigarettes.  He has a 18.50 pack-year smoking history. he has never used smokeless tobacco. He reports that he uses drugs. Drugs: IV and Heroin. He reports that he does not drink alcohol.   Review of Systems   Lipid history: No history of hyperlipidemia    Lab Results  Component Value Date   CHOL 102 05/06/2017   HDL 22.90 (L) 05/06/2017   LDLCALC 48 05/06/2017   TRIG 153.0 (H) 05/06/2017   CHOLHDL 4 05/06/2017           Hypertension: None  He is recently having some lower leg and pedal edema, has not discussed with PCP.  He reportedly has a relatively high salt intake  Most recent eye exam was ?  5 years ago  Most recent foot exam: 11/18   Physical Examination:  BP (!) 98/56   Pulse (!) 128   Ht '5\' 7"'$  (1.702 m)   Wt 128 lb (58.1 kg)   SpO2 98%   BMI 20.05 kg/m  1+ edema on his legs  ASSESSMENT:  Diabetes type 2, nonobese and persistently uncontrolled  He is insulin-dependent, may have insulin deficiency related to chronic pancreatitis  See history of present illness for detailed discussion of current diabetes management, blood sugar patterns and problems identified  Although his  blood sugars on an average or somewhat better with taking Lantus twice a day his blood sugars average waiting considerably Most of this is related to poor eating habits and eating a lot of snacks during the night causing blood sugars to be as high as 400 in the morning  He has not taken mealtime insulin to cover his meals and refuses  to take this because of fear of hypoglycemia  Poor knowledge of diabetes management and self-care and has not been seen by dietitian or educators as yet He has had hypoglycemia related to probably taking excessive doses of NovoLog early morning when his blood sugars were high and not accompanied by corresponding meal intake  Mild lower leg edema: He needs to follow-up with his PCP for this However will recheck his chemistry panel today   PLAN:     Start checking blood sugars at least once a day after a meal by rotation instead of just before eating  He will need to stop eating snacks during the night  Given detailed instructions for him to take the NovoLog only before a meal and will start his correction doses with blood sugars over 180  However he needs to check his blood sugars after meals to help assess the NovoLog dosage or the need to have minimum coverage for his meal intake  Since recently he may be eating a little better and overnight readings are improving he can cut down his Lantus to 8 units twice a day  Advised him not to take NovoLog unless he is eating a meal  Check fructosamine to assess his overall control  Given him the contact information for the dietitian and nurse educator to make an appointment    Patient Instructions  Reduce Lantus to 8 units  Check blood sugars on waking up  4-5/7 days  Also check blood sugars before other meals at times about 2 hours after a meal and do this after different meals by rotation  Recommended blood sugar levels on waking up is 90-130 and about 2 hours after meal is 130-180  Please bring your blood sugar monitor to each visit, thank you  Low carb meals and avoid snacking at night  NOVOLOG: Take this only right before a full meal as follows:  Blood sugar under 180: No insulin Blood sugar of 180-220 = 2 units  Blood sugar of 220-300 = 3 units  Blood sugar over 300 = 5 units  Please call the following: [facility] Address: [address], [place], [place] [address] Phone: [phone]     Elayne Snare 06/04/2017, 4:39 PM   Note: This office note was prepared with Dragon voice recognition system technology. Any transcriptional errors that result from this process are unintentional.

## 2017-06-04 NOTE — Patient Instructions (Addendum)
Reduce Lantus to 8 units  Check blood sugars on waking up  4-5/7 days  Also check blood sugars before other meals at times about 2 hours after a meal and do this after different meals by rotation  Recommended blood sugar levels on waking up is 90-130 and about 2 hours after meal is 130-180  Please bring your blood sugar monitor to each visit, thank you  Low carb meals and avoid snacking at night  NOVOLOG: Take this only right before a full meal as follows:  Blood sugar under 180: No insulin Blood sugar of 180-220 = 2 units  Blood sugar of 220-300 = 3 units  Blood sugar over 300 = 5 units  Please call the following: [facility] Address: [address], [place], [place] [address] Phone: [phone]

## 2017-06-05 LAB — FRUCTOSAMINE: Fructosamine: 391 umol/L — ABNORMAL HIGH (ref 0–285)

## 2017-06-08 NOTE — Telephone Encounter (Signed)
Call placed to patient #[phone] to confirm if he will would like  home health services to be sent and if he had an agency of choice. No answer. Left message asking patient to return my call at [phone].

## 2017-06-10 ENCOUNTER — Telehealth: Payer: Self-pay | Admitting: Family Medicine

## 2017-06-10 NOTE — Telephone Encounter (Signed)
Call placed to patient # [phone], regarding home health referral. Spoke with patient's sister and she informed me that patient is still at his brother's house. Ansonga expressed that she gave my message to patient and that's all she can do.

## 2017-06-12 ENCOUNTER — Other Ambulatory Visit: Payer: Self-pay | Admitting: Family Medicine

## 2017-06-12 DIAGNOSIS — M009 Pyogenic arthritis, unspecified: Secondary | ICD-10-CM

## 2017-06-17 NOTE — Telephone Encounter (Signed)
Called Olegario MessierKathy and gave her diagnosis code for diabetic supplies-this has been resolved

## 2017-06-17 NOTE — Telephone Encounter (Signed)
[facility] Medicare dept Calling about DWO.   She is needing the Diagnosis code.  She said Can be left on her VM so she doesn't have to keep calling back and forth for the code   Please advise  Jodelle GreenKathy [facility] Medicare Dept [phone]

## 2017-06-23 ENCOUNTER — Telehealth: Payer: Self-pay | Admitting: Family Medicine

## 2017-06-23 NOTE — Telephone Encounter (Signed)
Call placed to patient on both numbers #[phone] and #[phone] regarding home health and if he has a preference in any agencies. No answer on either numbers. Left messages asking patient to return my call at [phone].

## 2017-06-23 NOTE — Telephone Encounter (Signed)
Will notify provider.

## 2017-06-23 NOTE — Telephone Encounter (Signed)
Tried to reach patient on different occasions regarding home health but haven't heard back from him. I've spoke to patient's sister since that's whose number we have on Epic, and she stated that she has given patient my messages but that's all she can do.

## 2017-07-02 ENCOUNTER — Other Ambulatory Visit (INDEPENDENT_AMBULATORY_CARE_PROVIDER_SITE_OTHER): Payer: Medicare Other

## 2017-07-02 DIAGNOSIS — E1165 Type 2 diabetes mellitus with hyperglycemia: Secondary | ICD-10-CM | POA: Diagnosis not present

## 2017-07-02 DIAGNOSIS — Z794 Long term (current) use of insulin: Secondary | ICD-10-CM | POA: Diagnosis not present

## 2017-07-02 LAB — LIPID PANEL
CHOL/HDL RATIO: 3
Cholesterol: 90 mg/dL (ref 0–200)
HDL: 30.1 mg/dL — ABNORMAL LOW (ref >39.00)
LDL Cholesterol: 44 mg/dL (ref 0–99)
NONHDL: 59.64
TRIGLYCERIDES: 80 mg/dL (ref 0.0–149.0)
VLDL: 16 mg/dL (ref 0.0–40.0)

## 2017-07-02 LAB — GLUCOSE, RANDOM: Glucose, Bld: 132 mg/dL — ABNORMAL HIGH (ref 70–99)

## 2017-07-03 LAB — FRUCTOSAMINE: FRUCTOSAMINE: 413 umol/L — AB (ref 0–285)

## 2017-07-07 ENCOUNTER — Ambulatory Visit: Payer: Medicare Other | Admitting: Endocrinology

## 2017-07-07 DIAGNOSIS — Z0289 Encounter for other administrative examinations: Secondary | ICD-10-CM

## 2017-08-25 ENCOUNTER — Ambulatory Visit: Payer: Medicare Other | Admitting: Family Medicine

## 2017-08-25 MED FILL — MELOXICAM 7.5 MG TABLET: 7.5 | 30 days supply | Qty: 30 | Fill #0

## 2017-08-25 MED FILL — NOVOLOG FLEXPEN SYRINGE: 100 | 33 days supply | Qty: 12 | Fill #3

## 2017-08-25 MED FILL — BASAGLAR 100 UNIT/ML KWIKPE: 100 | 34 days supply | Qty: 12 | Fill #3

## 2017-08-25 MED FILL — MIRTAZAPINE 30 MG TABLET: 30 | 30 days supply | Qty: 30 | Fill #0

## 2017-08-25 MED FILL — ATORVASTATIN 40 MG TABLET: 40 | 30 days supply | Qty: 30 | Fill #0

## 2017-08-26 ENCOUNTER — Other Ambulatory Visit: Payer: Self-pay

## 2017-08-26 ENCOUNTER — Emergency Department (HOSPITAL_COMMUNITY)
Admission: EM | Admit: 2017-08-26 | Discharge: 2017-08-26 | Disposition: A | Discharge status: Home / Self Care | Payer: Medicare Other | Attending: Physician Assistant | Admitting: Physician Assistant

## 2017-08-26 ENCOUNTER — Encounter (HOSPITAL_COMMUNITY): Payer: Self-pay

## 2017-08-26 DIAGNOSIS — F329 Major depressive disorder, single episode, unspecified: Secondary | ICD-10-CM | POA: Insufficient documentation

## 2017-08-26 DIAGNOSIS — Z794 Long term (current) use of insulin: Secondary | ICD-10-CM | POA: Diagnosis not present

## 2017-08-26 DIAGNOSIS — F1110 Opioid abuse, uncomplicated: Secondary | ICD-10-CM | POA: Diagnosis not present

## 2017-08-26 DIAGNOSIS — Z23 Encounter for immunization: Secondary | ICD-10-CM | POA: Insufficient documentation

## 2017-08-26 DIAGNOSIS — F17210 Nicotine dependence, cigarettes, uncomplicated: Secondary | ICD-10-CM | POA: Insufficient documentation

## 2017-08-26 DIAGNOSIS — Z79899 Other long term (current) drug therapy: Secondary | ICD-10-CM | POA: Diagnosis not present

## 2017-08-26 DIAGNOSIS — E119 Type 2 diabetes mellitus without complications: Secondary | ICD-10-CM | POA: Diagnosis not present

## 2017-08-26 DIAGNOSIS — N182 Chronic kidney disease, stage 2 (mild): Secondary | ICD-10-CM | POA: Insufficient documentation

## 2017-08-26 DIAGNOSIS — R238 Other skin changes: Secondary | ICD-10-CM | POA: Diagnosis not present

## 2017-08-26 DIAGNOSIS — R2231 Localized swelling, mass and lump, right upper limb: Secondary | ICD-10-CM | POA: Diagnosis present

## 2017-08-26 DIAGNOSIS — L02413 Cutaneous abscess of right upper limb: Secondary | ICD-10-CM | POA: Diagnosis not present

## 2017-08-26 DIAGNOSIS — Z7982 Long term (current) use of aspirin: Secondary | ICD-10-CM | POA: Diagnosis not present

## 2017-08-26 LAB — COMPREHENSIVE METABOLIC PANEL
ALT: 20 U/L (ref 17–63)
ANION GAP: 10 (ref 5–15)
AST: 35 U/L (ref 15–41)
Albumin: 3 g/dL — ABNORMAL LOW (ref 3.5–5.0)
Alkaline Phosphatase: 97 U/L (ref 38–126)
BILIRUBIN TOTAL: 0.8 mg/dL (ref 0.3–1.2)
BUN: 10 mg/dL (ref 6–20)
CO2: 21 mmol/L — ABNORMAL LOW (ref 22–32)
Calcium: 8.5 mg/dL — ABNORMAL LOW (ref 8.9–10.3)
Chloride: 101 mmol/L (ref 101–111)
Creatinine, Ser: 1.62 mg/dL — ABNORMAL HIGH (ref 0.61–1.24)
GFR calc Af Amer: 52 mL/min — ABNORMAL LOW (ref >60)
GFR calc non Af Amer: 45 mL/min — ABNORMAL LOW (ref >60)
Glucose, Bld: 524 mg/dL (ref 65–99)
POTASSIUM: 5.1 mmol/L (ref 3.5–5.1)
Sodium: 132 mmol/L — ABNORMAL LOW (ref 135–145)
TOTAL PROTEIN: 6.8 g/dL (ref 6.5–8.1)

## 2017-08-26 LAB — CBC WITH DIFFERENTIAL/PLATELET
BASOS ABS: 0 10*3/uL (ref 0.0–0.1)
Basophils Relative: 0 %
EOS ABS: 0.1 10*3/uL (ref 0.0–0.7)
Eosinophils Relative: 2 %
HEMATOCRIT: 31.2 % — AB (ref 39.0–52.0)
Hemoglobin: 10.3 g/dL — ABNORMAL LOW (ref 13.0–17.0)
LYMPHS ABS: 1.4 10*3/uL (ref 0.7–4.0)
Lymphocytes Relative: 29 %
MCH: 25.1 pg — ABNORMAL LOW (ref 26.0–34.0)
MCHC: 33 g/dL (ref 30.0–36.0)
MCV: 76.1 fL — ABNORMAL LOW (ref 78.0–100.0)
MONOS PCT: 9 %
Monocytes Absolute: 0.4 10*3/uL (ref 0.1–1.0)
Neutro Abs: 2.8 10*3/uL (ref 1.7–7.7)
Neutrophils Relative %: 60 %
Platelets: 171 10*3/uL (ref 150–400)
RBC: 4.1 MIL/uL — AB (ref 4.22–5.81)
RDW: 13.3 % (ref 11.5–15.5)
WBC: 4.7 10*3/uL (ref 4.0–10.5)

## 2017-08-26 LAB — I-STAT CG4 LACTIC ACID, ED: Lactic Acid, Venous: 1.61 mmol/L (ref 0.5–1.9)

## 2017-08-26 LAB — CBG MONITORING, ED: GLUCOSE-CAPILLARY: 477 mg/dL — AB (ref 65–99)

## 2017-08-26 MED ORDER — TETANUS-DIPHTH-ACELL PERTUSSIS 5-2.5-18.5 LF-MCG/0.5 IM SUSP
0.5000 mL | Freq: Once | INTRAMUSCULAR | Status: AC
  Administered 2017-08-26: 0.5 mL via INTRAMUSCULAR
  Filled 2017-08-26: qty 0.5

## 2017-08-26 MED ORDER — DOXYCYCLINE HYCLATE 100 MG PO CAPS: 100.0000 mg | ORAL_CAPSULE | Freq: Two times a day (BID) | ORAL | 0 refills | Status: AC

## 2017-08-26 MED ORDER — INSULIN ASPART 100 UNIT/ML ~~LOC~~ SOLN
10.0000 [IU] | Freq: Once | SUBCUTANEOUS | Status: AC
  Administered 2017-08-26: 10 [IU] via SUBCUTANEOUS
  Filled 2017-08-26: qty 1

## 2017-08-26 MED ORDER — SODIUM CHLORIDE 0.9 % IV BOLUS (SEPSIS): 1000.0000 mL | Freq: Once | INTRAVENOUS | Status: DC

## 2017-08-26 MED ORDER — ACETAMINOPHEN 325 MG PO TABS
650.0000 mg | ORAL_TABLET | Freq: Once | ORAL | Status: AC
  Administered 2017-08-26: 650 mg via ORAL
  Filled 2017-08-26: qty 2

## 2017-08-26 MED ORDER — LIDOCAINE-EPINEPHRINE (PF) 2 %-1:200000 IJ SOLN
20.0000 mL | Freq: Once | INTRAMUSCULAR | Status: AC
  Administered 2017-08-26: 20 mL
  Filled 2017-08-26: qty 20

## 2017-08-26 MED ORDER — DOXYCYCLINE HYCLATE 100 MG PO TABS
100.0000 mg | ORAL_TABLET | Freq: Once | ORAL | Status: AC
Start: 2017-08-26 — End: 2017-08-26
  Administered 2017-08-26: 100 mg via ORAL
  Filled 2017-08-26: qty 1

## 2017-08-26 MED FILL — ZENPEP 25000-79000 UNIT CAP: 25000-79000 | 33 days supply | Qty: 100 | Fill #2

## 2017-08-26 NOTE — ED Provider Notes (Signed)
[facility] EMERGENCY DEPARTMENT Provider Note   CSN: [id number] Arrival date & time: 08/26/17  1017     History   Chief Complaint Chief Complaint  Patient presents with  . Abscess    HPI Raymond Winters is a 57 y.o. male.  HPI  Patient is a 57 year old male with a history of insulin-dependent diabetes mellitus, IVDU, alcohol dependence, depression, presenting for skin and soft tissue infection.  Patient reports that he is continuing to use IV heroin, and he has run out of veins so he began injecting subcutaneously.  Patient reports over the past week he had a progressively enlarging soft tissue abscess in the posterior aspect of the right upper arm.  Patient reports that it began spontaneously draining last night.  Patient reports that it continues to actively drain he wanted it checked out.  Patient also reports that superior to the cyst that drained last night, he had small areas of fluctuance reassess injected subcutaneous A.  Patient reports he felt chilled yesterday but denies any recorded fevers, nausea, vomiting, erythematous streaks down the arm, or other systemic symptoms.  Patient reports he did not take his Lantus last night or this morning.  Patient reports he typically takes it twice a day.  Patient denies desire to receive peers support consult at this time.  Patient does report that his primary care provider is attempting to get him into an intensive outpatient program.  Past Medical History:  Diagnosis Date  . Alcohol dependence ([place]) 01/05/2016  . Alcoholic liver disease, unspecified ([place]) 01/05/2016  . Arthritis    "lower extremeties, hands, shoulders, back" (08/20/2016)  . Chronic alcoholic pancreatitis ([place]) 01/05/2016  . Depression   . Family history of adverse reaction to anesthesia    pt's sister has hx. of post-op N/V  . Hepatitis C    "never treated" (08/20/2016)  . Heroin abuse ([place])   . High cholesterol   . Hypoalbuminemia due to protein-calorie  malnutrition ([place]) 01/05/2016  . Insulin dependent diabetes mellitus ([place])   . Lactic acid increased, CHRONIC 01/05/2016   Secondary to Liver Disease, Alcoholism, and Diabetes  . 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 Shoulder wound 01/2016   left    Patient Active Problem List   Diagnosis Date Noted  . DKA (diabetic ketoacidoses) ([place]) 05/20/2017  . Polysubstance abuse ([place]) 05/20/2017  . Hypoglycemia associated with diabetes ([place]) 02/20/2017  . Ankle pain, right   . Ankle cellulitis 12/15/2016  . Cellulitis of foot, right 12/14/2016  . Cellulitis of right ankle 12/14/2016  . Chronic kidney disease (CKD), stage II (mild) 12/14/2016  . Non compliance w medication regimen 12/08/2016  . Necrotizing fasciitis ([place]) 08/19/2016  . Hypoglycemia 06/22/2016  . Depression 02/03/2016  . Chronic hepatitis C ([place])   . Alcoholic liver disease, unspecified ([place]) 01/05/2016  . Alcohol dependence ([place]) 01/05/2016  . Chronic alcoholic pancreatitis ([place]) 01/05/2016  . Hypoalbuminemia due to protein-calorie malnutrition ([place]) 01/05/2016  . Lactic acid increased, CHRONIC 01/05/2016  . Diarrhea 01/04/2016  . Chronic diarrhea 01/04/2016  . Pressure ulcer 01/04/2016  . Uncontrolled type 2 diabetes mellitus with complication, with long-term current use of insulin ([place])   . Swelling   . Type 2 diabetes mellitus with complication, with long-term current use of insulin ([place])   . Septic joint of left shoulder region ([place])   . Abscess 12/24/2015  . Abscess of left shoulder   . Leukocytosis   . Type 2 diabetes mellitus  with complication, without long-term current use of insulin ([place])   . Glenohumeral arthritis 07/24/2015  . Left shoulder pain 07/24/2015  . 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 Tobacco abuse 07/26/2014  . Protein-calorie malnutrition, severe ([place]) 12/05/2013  . Hyperkalemia 12/04/2013  . Heroin withdrawal ([place]) 12/04/2013  . Alcohol abuse 12/04/2013  .  Hyperglycemia 12/04/2013  . Metabolic acidosis with respiratory alkalosis 12/04/2013  . Heroin abuse ([place]) 04/04/2013  . DM2 (diabetes mellitus, type 2) ([place]) 03/20/2013    Past Surgical History:  Procedure Laterality Date  . APPLICATION OF WOUND VAC Left 12/24/2015   Procedure: APPLICATION OF WOUND VAC; LEFT SHOULDER;  Surgeon: Leandrew Koyanagi, MD;  Location: [facility];  Service: Orthopedics;  Laterality: Left;  . APPLICATION OF WOUND VAC Left 01/29/2016   Procedure: LEFT SHOULDER SPLIT THICKNESS SKIN GRAFT, WOUND VAC;  Surgeon: Leandrew Koyanagi, MD;  Location: [facility];  Service: Orthopedics;  Laterality: Left;  . I&D EXTREMITY Right 03/20/2013   Procedure: IRRIGATION AND DEBRIDEMENT EXTREMITY;  Surgeon: Schuyler Amor, MD;  Location: [facility];  Service: Orthopedics;  Laterality: Right;  . I&D EXTREMITY Left 12/24/2015   Procedure:  DEBRIDEMENT OF MUSCLE, SUBCUTANEOUS TISSUE LEFT SHOULDER;  Surgeon: Leandrew Koyanagi, MD;  Location: [facility];  Service: Orthopedics;  Laterality: Left;  . I&D EXTREMITY Right 12/18/2016   Procedure: INCISION AND DRAINAGE ANKLE ABSCESS;  Surgeon: Renette Butters, MD;  Location: [facility];  Service: Orthopedics;  Laterality: Right;  . INCISION AND DRAINAGE ABSCESS Right 08/19/2016   Procedure: DEBRIDEMENT RIGHT BUTTOCK  ABSCESS;  Surgeon: Donnie Mesa, MD;  Location: [facility];  Service: General;  Laterality: Right;  . IRRIGATION AND DEBRIDEMENT ABSCESS Right 08/19/2016   buttocks  . IRRIGATION AND DEBRIDEMENT SHOULDER Left 12/27/2015   Procedure: IRRIGATION AND DEBRIDEMENT LEFT SHOULDER;  Surgeon: Leandrew Koyanagi, MD;  Location: [facility];  Service: Orthopedics;  Laterality: Left;  . IRRIGATION AND DEBRIDEMENT SHOULDER Left 12/30/2015   Procedure: IRRIGATION AND DEBRIDEMENT LEFT SHOULDER;  Surgeon: Leandrew Koyanagi, MD;  Location: [facility];  Service: Orthopedics;  Laterality: Left;  . SKIN SPLIT GRAFT Left 01/29/2016   Procedure: SKIN GRAFT SPLIT THICKNESS,;  Surgeon: Leandrew Koyanagi, MD;   Location: [facility];  Service: Orthopedics;  Laterality: Left;       Home Medications    Prior to Admission medications   Medication Sig Start Date End Date Taking? Authorizing Provider  aspirin EC 81 MG tablet Take 1 tablet (81 mg total) by mouth daily. 04/04/14   Lance Bosch, NP  atorvastatin (LIPITOR) 40 MG tablet Take 1 tablet (40 mg total) by mouth daily. 05/26/17   Charlott Rakes, MD  Blood Glucose Monitoring Suppl (ONETOUCH VERIO FLEX SYSTEM) w/Device KIT 1 Device by Other route 3 (three) times daily. Use as instructed to check blood sugars 3 times daily before meals 05/24/17   Elayne Snare, MD  ferrous sulfate 325 (65 FE) MG tablet Take 1 tablet (325 mg total) by mouth 2 (two) times daily with a meal. 12/23/16   Reyne Dumas, MD  gabapentin (NEURONTIN) 300 MG capsule TAKE 2 CAPSULE BY MOUTH AT BEDTIME FOR DIABETIC NERVE PAIN 05/26/17   Charlott Rakes, MD  glucose blood (ONETOUCH VERIO) test strip Use as instructed 3 times before meals. 05/24/17   Elayne Snare, MD  Insulin Glargine (LANTUS SOLOSTAR) 100 UNIT/ML Solostar Pen Inject 15 Units into the skin 2 (two) times daily. 05/24/17   Domenic Polite, MD  Insulin Pen  Needle 31G X 5 MM MISC Use 3 times daily before meals and at bedtime 12/08/16   Charlott Rakes, MD  Lancet Devices [facility]) lancets Use as instructed 3 times before meals 08/26/16   Newlin, Charlane Ferretti, MD  meloxicam (MOBIC) 7.5 MG tablet TAKE 1 TABLET(7.5 MG) BY MOUTH DAILY AS NEEDED FOR PAIN 06/17/17   Charlott Rakes, MD  mirtazapine (REMERON) 30 MG tablet Take 1 tablet (30 mg total) by mouth at bedtime. 05/26/17   Charlott Rakes, MD  Multiple Vitamins-Minerals (MULTIVITAMIN WITH MINERALS) tablet Take 1 tablet by mouth daily.    [provider]  Pancrelipase, Lip-Prot-Amyl, (ZENPEP) 25000 units CPEP Take 25,000 Units by mouth 3 (three) times daily before meals. 11/03/16   Charlott Rakes, MD  polyethylene glycol (MIRALAX / GLYCOLAX) packet  Take 17 g by mouth daily. 12/23/16   Reyne Dumas, MD    Family History Family History  Problem Relation Age of Onset  . Diabetes Mother   . Heart disease Mother   . Hypertension Mother   . Stroke Mother   . Vision loss Mother   . Heart disease Father   . Hypertension Father   . Anesthesia problems Sister        post-op N/V  . Diabetes Sister     Social History Social History   Tobacco Use  . Smoking status: Current Every Day Smoker    Packs/day: 0.50    Years: 37.00    Pack years: 18.50    Types: Cigarettes  . Smokeless tobacco: Never Used  Substance Use Topics  . Alcohol use: No    Alcohol/week: 0.0 oz    Comment: 08/20/2016 "stopped ~ 1 month ago"  . Drug use: Yes    Types: IV, Heroin    Comment: Heroin, last used 08/25/17     Allergies   Ativan [lorazepam]   Review of Systems Review of Systems  Constitutional: Negative for chills and fever.  HENT: Negative for congestion and rhinorrhea.   Respiratory: Negative for shortness of breath.   Cardiovascular: Negative for chest pain.  Gastrointestinal: Negative for abdominal pain, nausea and vomiting.  Skin: Positive for color change and wound.     Physical Exam Updated Vital Signs BP 116/66 (BP Location: Right Arm)   Pulse 78   Temp 98.1 F (36.7 C) (Oral)   Resp 12   SpO2 100%   Physical Exam  Constitutional: He appears well-developed and well-nourished. No distress.  Thin and chronically ill-appearing.  Appears older than stated age.  HENT:  Head: Normocephalic and atraumatic.  Mouth/Throat: Oropharynx is clear and moist.  Eyes: Conjunctivae and EOM are normal. Pupils are equal, round, and reactive to light.  Neck: Normal range of motion. Neck supple.  Cardiovascular: Normal rate, regular rhythm, S1 normal and S2 normal.  No murmur heard. Pulmonary/Chest: Effort normal and breath sounds normal. He has no wheezes. He has no rales.  Abdominal: There is no tenderness.  Musculoskeletal: Normal range of  motion. He exhibits no edema or deformity.  Lymphadenopathy:    He has no cervical adenopathy.  Neurological: He is alert.  Cranial nerves grossly intact. Patient moves extremities symmetrically and with good coordination.  Skin: Skin is warm and dry. No rash noted. No erythema.  There is an area of active drainage on the posterior aspect of the right upper arm with minimal surrounding erythema.  No erythematous streaks down the arm.  There are multiple punctate areas of fluctuance more proximal to this active drainage.  Psychiatric:  He has a normal mood and affect. His behavior is normal. Judgment and thought content normal.  Nursing note and vitals reviewed.    ED Treatments / Results  Labs (all labs ordered are listed, but only abnormal results are displayed) Labs Reviewed  COMPREHENSIVE METABOLIC PANEL - Abnormal; Notable for the following components:      Result Value   Sodium 132 (*)    CO2 21 (*)    Glucose, Bld 524 (*)    Creatinine, Ser 1.62 (*)    Calcium 8.5 (*)    Albumin 3.0 (*)    GFR calc non Af Amer 45 (*)    GFR calc Af Amer 52 (*)    All other components within normal limits  CBC WITH DIFFERENTIAL/PLATELET - Abnormal; Notable for the following components:   RBC 4.10 (*)    Hemoglobin 10.3 (*)    HCT 31.2 (*)    MCV 76.1 (*)    MCH 25.1 (*)    All other components within normal limits  CBG MONITORING, ED - Abnormal; Notable for the following components:   Glucose-Capillary 477 (*)    All other components within normal limits  I-STAT CG4 LACTIC ACID, ED  I-STAT CG4 LACTIC ACID, ED  CBG MONITORING, ED    EKG  EKG Interpretation None       Radiology No results found.  Procedures .Marland KitchenIncision and Drainage Date/Time: 08/26/2017 5:45 PM Performed by: Albesa Seen, PA-C Authorized by: Albesa Seen, PA-C   Consent:    Consent obtained:  Verbal   Consent given by:  Patient   Risks discussed:  Bleeding, incomplete drainage and infection    Alternatives discussed:  No treatment Location:    Type:  Abscess   Location:  Upper extremity   Upper extremity location:  Arm   Arm location:  R upper arm Pre-procedure details:    Skin preparation:  Betadine Anesthesia (see MAR for exact dosages):    Anesthesia method:  Local infiltration   Local anesthetic:  Lidocaine 2% WITH epi Procedure type:    Complexity:  Simple Procedure details:    Incision types:  Stab incision   Incision depth:  Dermal   Scalpel blade:  11   Wound management:  Probed and deloculated   Drainage:  Purulent   Drainage amount:  Moderate   Wound treatment:  Wound left open   Packing materials:  None Post-procedure details:    Patient tolerance of procedure:  Tolerated well, no immediate complications   (including critical care time)  Medications Ordered in ED Medications  insulin aspart (novoLOG) injection 10 Units (not administered)  acetaminophen (TYLENOL) tablet 650 mg (not administered)     Initial Impression / Assessment and Plan / ED Course  I have reviewed the triage vital signs and the nursing notes.  Pertinent labs & imaging results that were available during my care of the patient were reviewed by me and considered in my medical decision making (see chart for details).     Patient is nontoxic-appearing, afebrile, and in no acute distress.  Patient exhibits a actively draining abscess of the right upper arm.  Will perform bedside ultrasound to reassess need for further I&D.  Place patient on doxycycline.  No evidence of DKA on lab work today.  Patient does exhibit elevated glucose of 477.  Patient did not take Lantus yesterday or today.  Do not feel the patient needs further intervention for hyperglycemia at this time.  Patient with hemglobin of 10.3.  This is per patient's prior baseline.  Hemoglobin  Date Value Ref Range Status  08/26/2017 10.3 (L) 13.0 - 17.0 g/dL Final  05/22/2017 9.4 (L) 13.0 - 17.0 g/dL Final  05/21/2017 8.6  (L) 13.0 - 17.0 g/dL Final  05/21/2017 9.8 (L) 13.0 - 17.0 g/dL Final    Comment:    REPEATED TO VERIFY DELTA CHECK NOTED   12/31/2016 9.8 (L) 13.0 - 17.7 g/dL Final   I&D performed at bedside.  Large abscess the patient noted earlier today actively draining.  No need for further I&D.  Patient placed on doxycycline.  Patient to follow-up in 48 hours emergency department for recheck of his wounds.  Patient can follow-up with his primary care provider soon as possible.  Return process given for any erythema, drainage, fever chills, or other systemic symptoms patient is understanding agrees with plan of care.  This is a supervised visit with Dr. Zenovia Jarred. Evaluation, management, and discharge planning discussed with this attending physician.  Final Clinical Impressions(s) / ED Diagnoses   Final diagnoses:  Abscess of arm, right    ED Discharge Orders        Ordered    doxycycline (VIBRAMYCIN) 100 MG capsule  2 times daily     08/26/17 1740       Albesa Seen, PA-C 08/26/17 1746    Macarthur Critchley, MD 08/26/17 2351

## 2017-08-26 NOTE — ED Notes (Signed)
Patient is now in the room getting undress waiting for provider with call bell in reach

## 2017-08-26 NOTE — ED Triage Notes (Signed)
Pt states he is an IV drug user and that he has been injecting into the subq tissue instead of the veins. Pt has what appears to be a healing abscess to the right outer abscess. Pt states the area is sore and has been there X2 weeks. Pt afebrile in triage.

## 2017-08-26 NOTE — ED Notes (Signed)
Hook patient up the monitor gave a warm blanket patient is resting with call bell in reach

## 2017-08-26 NOTE — Clinical Social Work Note (Addendum)
Clinical Social Work Assessment  Patient Details  Name: Raymond Winters MRN: [id number] Date of Birth: Mar 19, 1961  Date of referral:  08/26/17               Reason for consult:  Substance Use/ETOH Abuse                Permission sought to share information with:  Case Manager Permission granted to share information::  Yes, Verbal Permission Granted  Name::        Agency::     Relationship::     Contact Information:     Housing/Transportation Living arrangements for the past 2 months:  Apartment Source of Information:  Patient Patient Interpreter Needed:  None Criminal Activity/Legal Involvement Pertinent to Current Situation/Hospitalization:    Significant Relationships:  Siblings Lives with:  Siblings Do you feel safe going back to the place where you live?  Yes Need for family participation in patient care:  Yes (Comment)  Care giving concerns:  CSW consulted due to pt expressing needing out patient drug rehab.   Social Worker assessment / plan:  CSW met with pt at pt's bedside. Pt was pleasant and explained to CSW that he is looking to go to inpatient rehab. Pt stated he had gone to an inpatient facility in [place] before and will be going back there. Pt stated he is working with his PCP to arrange that and did not require additional assistance from me.   Pt explained to CSW that he receives a disability check, but it is not very much. Pt is concerned for the future if something happens to his older brother, whom he lives with. CSW encouraged pt to work with [place] to see if his check be increased. Pt is a English as a second language teacher. CSW encouraged pt to reach out to the [facility] and see what services they have available. Pt informed CSW that he has been to the [facility] in [place] before.   Pt is agreeable to Home Health. CSW will notify CM.   Employment status:  Disabled (Comment on whether or not currently receiving Disability)(Receiving Disability) Insurance information:  Medicare PT  Recommendations:  Not assessed at this time Information / Referral to community resources:  Outpatient Substance Abuse Treatment Options, Other (Comment Required)([facility] [place])  Patient/Family's Response to care:  Pt is agreeable to plan of care.   Patient/Family's Understanding of and Emotional Response to Diagnosis, Current Treatment, and Prognosis:  Pt did not have any questions or concerns for this CSW at this time.   Emotional Assessment Appearance:  Appears stated age Attitude/Demeanor/Rapport:    Affect (typically observed):  Accepting, Calm, Pleasant, Appropriate Orientation:  Oriented to Self, Oriented to Place, Oriented to  Time, Oriented to Situation Alcohol / Substance use:  Illicit Drugs Psych involvement (Current and /or in the community):  No (Comment)  Discharge Needs  Concerns to be addressed:  Substance Abuse Concerns Readmission within the last 30 days:  No Current discharge risk:  Substance Abuse Barriers to Discharge:  No Barriers Identified   Wendelyn Breslow, LCSW 08/26/2017, 5:01 PM

## 2017-08-26 NOTE — ED Notes (Signed)
CRITICAL VALUE ALERT  Critical Value:  Glucose 524  Date & Time Notied:  08/26/2017 1215  Provider Notified: Dr. Denton LankSteinl

## 2017-08-26 NOTE — ED Notes (Signed)
Pt's absess bandaged and supplies were provided before discharge

## 2017-08-26 NOTE — Discharge Instructions (Signed)
Please see the information and instructions below regarding your visit.  Your diagnoses today include:  1. Abscess of arm, right     Abscesses form when an infection in your skin starts to collect bacteria and white blood cells, walling it off from the rest of your body to protect you from a bigger infection. Risk factors for this type of infection include:  ?Break in the skin ?Diabetes ?Swollen areas  Sometimes the infection starts to spread to surrounding tissue, causing redness and swelling. We call this cellulitis.   Tests performed today include: See side panel of your discharge paperwork for testing performed today. Vital signs are listed at the bottom of these instructions.   Medications prescribed:    Take any prescribed medications only as prescribed, and any over the counter medications only as directed on the packaging.  1. Please take all of your antibiotics until finished.   You may develop abdominal discomfort or nausea from the antibiotic. If this occurs, you may take it with food. Some patients also get diarrhea with antibiotics. You may help offset this with probiotics which you can buy or get in yogurt. Do not eat or take the probiotics until 2 hours after your antibiotic. Some women develop vaginal yeast infections after antibiotics. If you develop unusual vaginal discharge after being on this medication, please see your primary care provider.   Some people develop allergies to antibiotics. Symptoms of antibiotic allergy can be mild and include a flat rash and itching. They can also be more serious and include:  ?Hives - Hives are raised, red patches of skin that are usually very itchy.  ?Lip or tongue swelling  ?Trouble swallowing or breathing  ?Blistering of the skin or mouth.  If you have any of these serious symptoms, please seek emergency medical care immediately.  Tylenol for pain.  Please do not exceed 4 g of Tylenol 1 day.  Home care instructions:   Please follow any educational materials contained in this packet.   Some things that may promote healing of your wound and infection include:  Raise your arm or leg to reduce swelling - Raise the arm or leg up above the level of your heart 3 or 4 times a day, for 30 minutes each time. Keep the infected area clean and dry. You can take a shower or bath, but be sure to pat the area dry with a towel afterward. Do not put any antibiotic ointments or creams on the area. Reapply a dry gauze dressing any time the bandage has become soaked with drainage, or after cleansing the wound.  Apply warm compresses to the wound 3-4 times daily to encourage drainage.   Return instructions:  Please return to the Emergency Department if you experience worsening symptoms. You should return for reevaluation of your infection if you notice spreading redness, increased swelling, an abscess develops, or you develop signs and symptoms of a systemic illness such as fever and chills.  Please return if you have any other emergent concerns.  Additional Information:   Your vital signs today were: BP 116/66 (BP Location: Right Arm)    Pulse 78    Temp 98.1 F (36.7 C) (Oral)    Resp 12    SpO2 100%  If your blood pressure (BP) was elevated on multiple readings during this visit above 130 for the top number or above 80 for the bottom number, please have this repeated by your primary care provider within one month. --------------  Thank you  for allowing us to participate in your care today.

## 2017-09-14 ENCOUNTER — Ambulatory Visit: Payer: Medicare Other | Attending: Family Medicine | Admitting: Family Medicine

## 2017-09-14 ENCOUNTER — Encounter: Payer: Self-pay | Admitting: Family Medicine

## 2017-09-14 VITALS — BP 72/40 | HR 75 | Temp 97.4°F | Ht 67.0 in | Wt 102.4 lb

## 2017-09-14 DIAGNOSIS — B1920 Unspecified viral hepatitis C without hepatic coma: Secondary | ICD-10-CM | POA: Diagnosis not present

## 2017-09-14 DIAGNOSIS — Z888 Allergy status to other drugs, medicaments and biological substances status: Secondary | ICD-10-CM | POA: Insufficient documentation

## 2017-09-14 DIAGNOSIS — Z9889 Other specified postprocedural states: Secondary | ICD-10-CM | POA: Insufficient documentation

## 2017-09-14 DIAGNOSIS — E7800 Pure hypercholesterolemia, unspecified: Secondary | ICD-10-CM | POA: Insufficient documentation

## 2017-09-14 DIAGNOSIS — Z1211 Encounter for screening for malignant neoplasm of colon: Secondary | ICD-10-CM | POA: Insufficient documentation

## 2017-09-14 DIAGNOSIS — F1910 Other psychoactive substance abuse, uncomplicated: Secondary | ICD-10-CM

## 2017-09-14 DIAGNOSIS — E1140 Type 2 diabetes mellitus with diabetic neuropathy, unspecified: Secondary | ICD-10-CM | POA: Diagnosis present

## 2017-09-14 DIAGNOSIS — K860 Alcohol-induced chronic pancreatitis: Secondary | ICD-10-CM | POA: Diagnosis not present

## 2017-09-14 DIAGNOSIS — I9589 Other hypotension: Secondary | ICD-10-CM | POA: Diagnosis not present

## 2017-09-14 DIAGNOSIS — F1020 Alcohol dependence, uncomplicated: Secondary | ICD-10-CM | POA: Diagnosis not present

## 2017-09-14 DIAGNOSIS — E1165 Type 2 diabetes mellitus with hyperglycemia: Secondary | ICD-10-CM | POA: Diagnosis not present

## 2017-09-14 DIAGNOSIS — Z9119 Patient's noncompliance with other medical treatment and regimen: Secondary | ICD-10-CM | POA: Insufficient documentation

## 2017-09-14 DIAGNOSIS — Z9114 Patient's other noncompliance with medication regimen: Secondary | ICD-10-CM | POA: Diagnosis not present

## 2017-09-14 DIAGNOSIS — Z794 Long term (current) use of insulin: Secondary | ICD-10-CM | POA: Insufficient documentation

## 2017-09-14 DIAGNOSIS — Z7982 Long term (current) use of aspirin: Secondary | ICD-10-CM | POA: Diagnosis not present

## 2017-09-14 DIAGNOSIS — Z79899 Other long term (current) drug therapy: Secondary | ICD-10-CM | POA: Insufficient documentation

## 2017-09-14 LAB — POCT GLYCOSYLATED HEMOGLOBIN (HGB A1C): HEMOGLOBIN A1C: 13.6

## 2017-09-14 LAB — GLUCOSE, POCT (MANUAL RESULT ENTRY): POC Glucose: 285 mg/dl — AB (ref 70–99)

## 2017-09-14 NOTE — Progress Notes (Signed)
Subjective:  Patient ID: Raymond Winters, male    DOB: November 23, 1960  Age: 57 y.o. MRN: [id number]  CC: Diabetes   HPI Raymond Winters is a 57 year old male with history of type 2 diabetes mellitus (A1c 13.6), diabetic neuropathy, chronic alcoholic pancreatitis, substance abuse, noncompliance who presents today for follow-up visit. He had an ED visit for right arm abscess status post incision and drainage 2 weeks ago and he reports healing of the abscess.  Informs me he last used heroin 2 weeks ago. Denies fevers. Blood pressure is 72/40 and he denies lightheadedness.  Previous blood pressure has been in the 332-951 range systolic.  Diabetes mellitus is followed by endocrine and he claims he has been compliant with his Lantus and reports fasting sugars have been in the 250 range.  He is not compliant with a diabetic diet and does not exercise.  Past Medical History:  Diagnosis Date  . Alcohol dependence ([place]) 01/05/2016  . Alcoholic liver disease, unspecified ([place]) 01/05/2016  . Arthritis    "lower extremeties, hands, shoulders, back" (08/20/2016)  . Chronic alcoholic pancreatitis ([place]) 01/05/2016  . Depression   . Family history of adverse reaction to anesthesia    pt's sister has hx. of post-op N/V  . Hepatitis C    "never treated" (08/20/2016)  . Heroin abuse ([place])   . High cholesterol   . Hypoalbuminemia due to protein-calorie malnutrition ([place]) 01/05/2016  . Insulin dependent diabetes mellitus ([place])   . Lactic acid increased, CHRONIC 01/05/2016   Secondary to Liver Disease, Alcoholism, and Diabetes  . 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 Shoulder wound 01/2016   left    Past Surgical History:  Procedure Laterality Date  . APPLICATION OF WOUND VAC Left 12/24/2015   Procedure: APPLICATION OF WOUND VAC; LEFT SHOULDER;  Surgeon: Leandrew Koyanagi, MD;  Location: [facility];  Service: Orthopedics;  Laterality: Left;  . APPLICATION OF WOUND VAC Left 01/29/2016   Procedure: LEFT  SHOULDER SPLIT THICKNESS SKIN GRAFT, WOUND VAC;  Surgeon: Leandrew Koyanagi, MD;  Location: [place];  Service: Orthopedics;  Laterality: Left;  . I&D EXTREMITY Right 03/20/2013   Procedure: IRRIGATION AND DEBRIDEMENT EXTREMITY;  Surgeon: Schuyler Amor, MD;  Location: [facility];  Service: Orthopedics;  Laterality: Right;  . I&D EXTREMITY Left 12/24/2015   Procedure:  DEBRIDEMENT OF MUSCLE, SUBCUTANEOUS TISSUE LEFT SHOULDER;  Surgeon: Leandrew Koyanagi, MD;  Location: [facility];  Service: Orthopedics;  Laterality: Left;  . I&D EXTREMITY Right 12/18/2016   Procedure: INCISION AND DRAINAGE ANKLE ABSCESS;  Surgeon: Renette Butters, MD;  Location: [facility];  Service: Orthopedics;  Laterality: Right;  . INCISION AND DRAINAGE ABSCESS Right 08/19/2016   Procedure: DEBRIDEMENT RIGHT BUTTOCK  ABSCESS;  Surgeon: Donnie Mesa, MD;  Location: [facility];  Service: General;  Laterality: Right;  . IRRIGATION AND DEBRIDEMENT ABSCESS Right 08/19/2016   buttocks  . IRRIGATION AND DEBRIDEMENT SHOULDER Left 12/27/2015   Procedure: IRRIGATION AND DEBRIDEMENT LEFT SHOULDER;  Surgeon: Leandrew Koyanagi, MD;  Location: [facility];  Service: Orthopedics;  Laterality: Left;  . IRRIGATION AND DEBRIDEMENT SHOULDER Left 12/30/2015   Procedure: IRRIGATION AND DEBRIDEMENT LEFT SHOULDER;  Surgeon: Leandrew Koyanagi, MD;  Location: [facility];  Service: Orthopedics;  Laterality: Left;  . SKIN SPLIT GRAFT Left 01/29/2016   Procedure: SKIN GRAFT SPLIT THICKNESS,;  Surgeon: Leandrew Koyanagi, MD;  Location: [facility];  Service: Orthopedics;  Laterality: Left;  Allergies  Allergen Reactions  . Ativan [Lorazepam] Other (See Comments)    HALLUCINATIONS     Outpatient Medications Prior to Visit  Medication Sig Dispense Refill  . aspirin EC 81 MG tablet Take 1 tablet (81 mg total) by mouth daily. 30 tablet 5  . atorvastatin (LIPITOR) 40 MG tablet Take 1 tablet (40 mg total) by mouth daily. 30 tablet 5  . Blood Glucose Monitoring Suppl (ONETOUCH  VERIO FLEX SYSTEM) w/Device KIT 1 Device by Other route 3 (three) times daily. Use as instructed to check blood sugars 3 times daily before meals 1 kit 1  . ferrous sulfate 325 (65 FE) MG tablet Take 1 tablet (325 mg total) by mouth 2 (two) times daily with a meal. 60 tablet 3  . gabapentin (NEURONTIN) 300 MG capsule TAKE 2 CAPSULE BY MOUTH AT BEDTIME FOR DIABETIC NERVE PAIN 120 capsule 5  . glucose blood (ONETOUCH VERIO) test strip Use as instructed 3 times before meals. 100 each 3  . Insulin Glargine (LANTUS SOLOSTAR) 100 UNIT/ML Solostar Pen Inject 15 Units into the skin 2 (two) times daily. (Patient taking differently: Inject 10 Units into the skin 2 (two) times daily. ) 15 mL 0  . Insulin Pen Needle 31G X 5 MM MISC Use 3 times daily before meals and at bedtime 120 each 5  . Lancet Devices (ACCU-CHEK SOFTCLIX) lancets Use as instructed 3 times before meals 1 each 5  . mirtazapine (REMERON) 30 MG tablet Take 1 tablet (30 mg total) by mouth at bedtime. 30 tablet 5  . Multiple Vitamins-Minerals (MULTIVITAMIN WITH MINERALS) tablet Take 1 tablet by mouth daily.    Marland Kitchen NOVOLOG FLEXPEN 100 UNIT/ML FlexPen Inject 0-12 Units into the skin See admin instructions. Inject 0-12 units subcutaneously before meals as per sliding scale as directed  3  . polyethylene glycol (MIRALAX / GLYCOLAX) packet Take 17 g by mouth daily. (Patient taking differently: Take 17 g by mouth daily as needed for mild constipation. ) 14 each 0  . ZENPEP 25000-79000 units CPEP Take 1 capsule by mouth 3 (three) times daily.  3  . meloxicam (MOBIC) 7.5 MG tablet TAKE 1 TABLET(7.5 MG) BY MOUTH DAILY AS NEEDED FOR PAIN (Patient not taking: Reported on 08/26/2017) 30 tablet 0  . Pancrelipase, Lip-Prot-Amyl, (ZENPEP) 25000 units CPEP Take 25,000 Units by mouth 3 (three) times daily before meals. (Patient not taking: Reported on 09/14/2017) 90 capsule 3   No facility-administered medications prior to visit.     ROS Review of Systems    Constitutional: Negative for activity change and appetite change.  HENT: Negative for sinus pressure and sore throat.   Eyes: Negative for visual disturbance.  Respiratory: Negative for cough, chest tightness and shortness of breath.   Cardiovascular: Negative for chest pain and leg swelling.  Gastrointestinal: Negative for abdominal distention, abdominal pain, constipation and diarrhea.  Endocrine: Negative.   Genitourinary: Negative for dysuria.  Musculoskeletal: Negative for joint swelling and myalgias.  Skin: Negative for rash.  Allergic/Immunologic: Negative.   Neurological: Negative for weakness, light-headedness and numbness.  Psychiatric/Behavioral: Negative for dysphoric mood and suicidal ideas.    Objective:  BP (!) 72/40   Pulse 75   Temp (!) 97.4 F (36.3 C) (Oral)   Ht '5\' 7"'$  (1.702 m)   Wt 102 lb 6.4 oz (46.4 kg)   SpO2 99%   BMI 16.04 kg/m   BP/Weight 09/14/2017 08/26/2017 16/03/9603  Systolic BP 72 540 98  Diastolic BP 40 76 56  Wt. (  Lbs) 102.4 - 128  BMI 16.04 - 20.05      Physical Exam  Constitutional: He is oriented to person, place, and time. He appears well-developed.  Thin  Cardiovascular: Normal rate, normal heart sounds and intact distal pulses.  No murmur heard. Pulmonary/Chest: Effort normal and breath sounds normal. He has no wheezes. He has no rales. He exhibits no tenderness.  Abdominal: Soft. Bowel sounds are normal. He exhibits no distension and no mass. There is no tenderness.  Musculoskeletal: Normal range of motion.  Neurological: He is alert and oriented to person, place, and time.  Skin: Skin is warm and dry.  Psychiatric: He has a normal mood and affect.    CMP Latest Ref Rng & Units 08/26/2017 07/02/2017 06/04/2017  Glucose 65 - 99 mg/dL 524(HH) 132(H) 107(H)  BUN 6 - 20 mg/dL 10 - 14  Creatinine 0.61 - 1.24 mg/dL 1.62(H) - 1.12  Sodium 135 - 145 mmol/L 132(L) - 142  Potassium 3.5 - 5.1 mmol/L 5.1 - 4.5  Chloride 101 - 111 mmol/L  101 - 111  CO2 22 - 32 mmol/L 21(L) - 26  Calcium 8.9 - 10.3 mg/dL 8.5(L) - 8.2(L)  Total Protein 6.5 - 8.1 g/dL 6.8 - -  Total Bilirubin 0.3 - 1.2 mg/dL 0.8 - -  Alkaline Phos 38 - 126 U/L 97 - -  AST 15 - 41 U/L 35 - -  ALT 17 - 63 U/L 20 - -    Lab Results  Component Value Date   HGBA1C 13.6 09/14/2017    Assessment & Plan:   1. Uncontrolled type 2 diabetes mellitus with hyperglycemia, with long-term current use of insulin (H[facility]) Uncontrolled with A1c of 13.6; reports some home blood sugar reading is in keeping with noncompliance Endorses compliance with insulin regimen Advised to take 15 units of Lantus twice daily as prescribed rather than 10 units which he is taking and he needs to schedule an appointment with his endocrinologist ASAP - POCT glucose (manual entry) - POCT glycosylated hemoglobin (Hb A1C)  2. Polysubstance abuse ([place]) Last used heroin 2 weeks ago as per patient Discussed dangers of polysubstance abuse but he is nonchalant about this.  3. Other specified hypotension  Soft blood pressure but he is asymptomatic We will hold off on placing on midodrine as his blood pressures have been in the 716-967 range systolic at previous visits  4. Screening for colon cancer - Ambulatory referral to Gastroenterology   No orders of the defined types were placed in this encounter.   Follow-up: Return in about 3 months (around 12/15/2017) for follow up of chronic medical conditions.   Charlott Rakes MD

## 2017-09-15 ENCOUNTER — Other Ambulatory Visit: Payer: Self-pay | Admitting: Endocrinology

## 2017-09-24 ENCOUNTER — Telehealth: Payer: Self-pay

## 2017-09-24 NOTE — Telephone Encounter (Signed)
Pt sister called and rescheduled appt that was missed in Jan for pt and she wanted to also have pt labs done that day.Appointment scheduled for 11/03/17 @3 :15 and lab appt @ 2:45 can you please order follow up labs for pt prior to appt?

## 2017-09-26 ENCOUNTER — Other Ambulatory Visit: Payer: Self-pay | Admitting: Endocrinology

## 2017-09-26 ENCOUNTER — Other Ambulatory Visit: Payer: Self-pay | Admitting: Family Medicine

## 2017-09-26 DIAGNOSIS — E1165 Type 2 diabetes mellitus with hyperglycemia: Secondary | ICD-10-CM

## 2017-09-26 DIAGNOSIS — Z794 Long term (current) use of insulin: Principal | ICD-10-CM

## 2017-09-26 NOTE — Telephone Encounter (Signed)
Lab has been ordered.  He needs to check his sugar 3 times a day and bring his monitor when he comes in.  Also will refer him to dietitian Oran ReinLaura Jobe but he needs to make an appointment to see her also

## 2017-09-27 NOTE — Telephone Encounter (Signed)
Since his blood sugars are out of control would like to see him as possible, same day labs

## 2017-09-27 NOTE — Telephone Encounter (Signed)
No availability on day of appt so scheduled pt on 10/12/17 with Oran ReinLaura Jobe and left vm for his sister Ms. Short with appointment details

## 2017-10-05 ENCOUNTER — Emergency Department (HOSPITAL_COMMUNITY): Payer: Medicare Other

## 2017-10-05 ENCOUNTER — Other Ambulatory Visit: Payer: Self-pay

## 2017-10-05 ENCOUNTER — Encounter (HOSPITAL_COMMUNITY): Payer: Self-pay | Admitting: Emergency Medicine

## 2017-10-05 ENCOUNTER — Inpatient Hospital Stay (HOSPITAL_COMMUNITY)
Admission: EM | Admit: 2017-10-05 | Discharge: 2017-10-08 | DRG: 602 | Disposition: A | Discharge status: Home Health | Payer: Medicare Other | Attending: Family Medicine | Admitting: Family Medicine

## 2017-10-05 DIAGNOSIS — E1129 Type 2 diabetes mellitus with other diabetic kidney complication: Secondary | ICD-10-CM | POA: Diagnosis present

## 2017-10-05 DIAGNOSIS — L02414 Cutaneous abscess of left upper limb: Secondary | ICD-10-CM | POA: Diagnosis not present

## 2017-10-05 DIAGNOSIS — E1122 Type 2 diabetes mellitus with diabetic chronic kidney disease: Secondary | ICD-10-CM | POA: Diagnosis present

## 2017-10-05 DIAGNOSIS — Z79899 Other long term (current) drug therapy: Secondary | ICD-10-CM

## 2017-10-05 DIAGNOSIS — E785 Hyperlipidemia, unspecified: Secondary | ICD-10-CM | POA: Diagnosis present

## 2017-10-05 DIAGNOSIS — Z794 Long term (current) use of insulin: Secondary | ICD-10-CM

## 2017-10-05 DIAGNOSIS — N183 Chronic kidney disease, stage 3 unspecified: Secondary | ICD-10-CM

## 2017-10-05 DIAGNOSIS — Z821 Family history of blindness and visual loss: Secondary | ICD-10-CM

## 2017-10-05 DIAGNOSIS — E43 Unspecified severe protein-calorie malnutrition: Secondary | ICD-10-CM | POA: Diagnosis present

## 2017-10-05 DIAGNOSIS — E11649 Type 2 diabetes mellitus with hypoglycemia without coma: Secondary | ICD-10-CM | POA: Diagnosis present

## 2017-10-05 DIAGNOSIS — Z7151 Drug abuse counseling and surveillance of drug abuser: Secondary | ICD-10-CM

## 2017-10-05 DIAGNOSIS — K709 Alcoholic liver disease, unspecified: Secondary | ICD-10-CM | POA: Diagnosis present

## 2017-10-05 DIAGNOSIS — K860 Alcohol-induced chronic pancreatitis: Secondary | ICD-10-CM | POA: Diagnosis present

## 2017-10-05 DIAGNOSIS — Z8249 Family history of ischemic heart disease and other diseases of the circulatory system: Secondary | ICD-10-CM

## 2017-10-05 DIAGNOSIS — F1010 Alcohol abuse, uncomplicated: Secondary | ICD-10-CM | POA: Diagnosis present

## 2017-10-05 DIAGNOSIS — L0291 Cutaneous abscess, unspecified: Secondary | ICD-10-CM | POA: Diagnosis not present

## 2017-10-05 DIAGNOSIS — F17210 Nicotine dependence, cigarettes, uncomplicated: Secondary | ICD-10-CM | POA: Diagnosis present

## 2017-10-05 DIAGNOSIS — Z7982 Long term (current) use of aspirin: Secondary | ICD-10-CM

## 2017-10-05 DIAGNOSIS — D509 Iron deficiency anemia, unspecified: Secondary | ICD-10-CM | POA: Diagnosis present

## 2017-10-05 DIAGNOSIS — F1020 Alcohol dependence, uncomplicated: Secondary | ICD-10-CM | POA: Diagnosis present

## 2017-10-05 DIAGNOSIS — F329 Major depressive disorder, single episode, unspecified: Secondary | ICD-10-CM | POA: Diagnosis present

## 2017-10-05 DIAGNOSIS — D638 Anemia in other chronic diseases classified elsewhere: Secondary | ICD-10-CM | POA: Diagnosis present

## 2017-10-05 DIAGNOSIS — L02413 Cutaneous abscess of right upper limb: Secondary | ICD-10-CM | POA: Diagnosis not present

## 2017-10-05 DIAGNOSIS — F1110 Opioid abuse, uncomplicated: Secondary | ICD-10-CM | POA: Diagnosis present

## 2017-10-05 DIAGNOSIS — E7800 Pure hypercholesterolemia, unspecified: Secondary | ICD-10-CM | POA: Diagnosis present

## 2017-10-05 DIAGNOSIS — Z716 Tobacco abuse counseling: Secondary | ICD-10-CM

## 2017-10-05 DIAGNOSIS — Z823 Family history of stroke: Secondary | ICD-10-CM

## 2017-10-05 DIAGNOSIS — Z888 Allergy status to other drugs, medicaments and biological substances status: Secondary | ICD-10-CM

## 2017-10-05 DIAGNOSIS — F1910 Other psychoactive substance abuse, uncomplicated: Secondary | ICD-10-CM | POA: Diagnosis present

## 2017-10-05 DIAGNOSIS — Z681 Body mass index (BMI) 19 or less, adult: Secondary | ICD-10-CM

## 2017-10-05 DIAGNOSIS — B1920 Unspecified viral hepatitis C without hepatic coma: Secondary | ICD-10-CM | POA: Diagnosis present

## 2017-10-05 DIAGNOSIS — Z720 Tobacco use: Secondary | ICD-10-CM | POA: Diagnosis present

## 2017-10-05 DIAGNOSIS — Z833 Family history of diabetes mellitus: Secondary | ICD-10-CM

## 2017-10-05 DIAGNOSIS — I129 Hypertensive chronic kidney disease with stage 1 through stage 4 chronic kidney disease, or unspecified chronic kidney disease: Secondary | ICD-10-CM | POA: Diagnosis present

## 2017-10-05 DIAGNOSIS — Z791 Long term (current) use of non-steroidal anti-inflammatories (NSAID): Secondary | ICD-10-CM

## 2017-10-05 HISTORY — DX: Chronic kidney disease, stage 3 unspecified: N18.30

## 2017-10-05 HISTORY — DX: Chronic kidney disease, stage 3 (moderate): N18.3

## 2017-10-05 LAB — CBC WITH DIFFERENTIAL/PLATELET
BASOS ABS: 0 10*3/uL (ref 0.0–0.1)
BASOS PCT: 0 %
EOS ABS: 0.1 10*3/uL (ref 0.0–0.7)
Eosinophils Relative: 1 %
HCT: 33.1 % — ABNORMAL LOW (ref 39.0–52.0)
Hemoglobin: 10.8 g/dL — ABNORMAL LOW (ref 13.0–17.0)
Lymphocytes Relative: 29 %
Lymphs Abs: 1.7 10*3/uL (ref 0.7–4.0)
MCH: 25.5 pg — ABNORMAL LOW (ref 26.0–34.0)
MCHC: 32.6 g/dL (ref 30.0–36.0)
MCV: 78.3 fL (ref 78.0–100.0)
MONOS PCT: 5 %
Monocytes Absolute: 0.3 10*3/uL (ref 0.1–1.0)
NEUTROS PCT: 65 %
Neutro Abs: 3.8 10*3/uL (ref 1.7–7.7)
Platelets: 157 10*3/uL (ref 150–400)
RBC: 4.23 MIL/uL (ref 4.22–5.81)
RDW: 14.4 % (ref 11.5–15.5)
WBC: 5.9 10*3/uL (ref 4.0–10.5)

## 2017-10-05 LAB — BASIC METABOLIC PANEL
ANION GAP: 11 (ref 5–15)
BUN: 11 mg/dL (ref 6–20)
CALCIUM: 8.4 mg/dL — AB (ref 8.9–10.3)
CO2: 21 mmol/L — ABNORMAL LOW (ref 22–32)
CREATININE: 1.4 mg/dL — AB (ref 0.61–1.24)
Chloride: 101 mmol/L (ref 101–111)
GFR, EST NON AFRICAN AMERICAN: 54 mL/min — AB (ref >60)
Glucose, Bld: 216 mg/dL — ABNORMAL HIGH (ref 65–99)
Potassium: 5 mmol/L (ref 3.5–5.1)
SODIUM: 133 mmol/L — AB (ref 135–145)

## 2017-10-05 LAB — I-STAT CHEM 8, ED
BUN: 12 mg/dL (ref 6–20)
Calcium, Ion: 1.08 mmol/L — ABNORMAL LOW (ref 1.15–1.40)
Chloride: 101 mmol/L (ref 101–111)
Creatinine, Ser: 1.4 mg/dL — ABNORMAL HIGH (ref 0.61–1.24)
Glucose, Bld: 262 mg/dL — ABNORMAL HIGH (ref 65–99)
HEMATOCRIT: 35 % — AB (ref 39.0–52.0)
Hemoglobin: 11.9 g/dL — ABNORMAL LOW (ref 13.0–17.0)
POTASSIUM: 5.3 mmol/L — AB (ref 3.5–5.1)
SODIUM: 134 mmol/L — AB (ref 135–145)
TCO2: 23 mmol/L (ref 22–32)

## 2017-10-05 LAB — CBG MONITORING, ED: GLUCOSE-CAPILLARY: 162 mg/dL — AB (ref 65–99)

## 2017-10-05 MED ORDER — IOPAMIDOL (ISOVUE-300) INJECTION 61%
100.0000 mL | Freq: Once | INTRAVENOUS | Status: AC | PRN
  Administered 2017-10-05: 100 mL via INTRAVENOUS

## 2017-10-05 MED ORDER — PIPERACILLIN-TAZOBACTAM 3.375 G IVPB 30 MIN
3.3750 g | Freq: Once | INTRAVENOUS | Status: AC
  Administered 2017-10-05: 3.375 g via INTRAVENOUS
  Filled 2017-10-05: qty 50

## 2017-10-05 MED ORDER — VANCOMYCIN HCL IN DEXTROSE 1-5 GM/200ML-% IV SOLN
1000.0000 mg | Freq: Once | INTRAVENOUS | Status: AC
  Administered 2017-10-05: 1000 mg via INTRAVENOUS
  Filled 2017-10-05: qty 200

## 2017-10-05 NOTE — ED Notes (Signed)
Waiting for IV team °

## 2017-10-05 NOTE — ED Notes (Signed)
Called CT to let them know pt has an IV

## 2017-10-05 NOTE — ED Triage Notes (Addendum)
Pt states he developed an abscess on his left arm and right arm about a week ago. Both are painful and red. Relatively small. Pt uses IV heroine and believes the abscess is from the drug use

## 2017-10-05 NOTE — ED Provider Notes (Signed)
Patient with history of heroin abuse, has been injecting into the muscles of the upper arm. Patient with induration and cellulitic appearance of left upper arm. Previous large abscess that required surgical intervention. Patient awaiting completion of CT to evaluate.  11:25 PM Results for orders placed or performed during the hospital encounter of 10/05/17  CBC with Differential  Result Value Ref Range   WBC 5.9 4.0 - 10.5 K/uL   RBC 4.23 4.22 - 5.81 MIL/uL   Hemoglobin 10.8 (L) 13.0 - 17.0 g/dL   HCT 16.1 (L) 09.6 - 04.5 %   MCV 78.3 78.0 - 100.0 fL   MCH 25.5 (L) 26.0 - 34.0 pg   MCHC 32.6 30.0 - 36.0 g/dL   RDW 40.9 81.1 - 91.4 %   Platelets 157 150 - 400 K/uL   Neutrophils Relative % 65 %   Neutro Abs 3.8 1.7 - 7.7 K/uL   Lymphocytes Relative 29 %   Lymphs Abs 1.7 0.7 - 4.0 K/uL   Monocytes Relative 5 %   Monocytes Absolute 0.3 0.1 - 1.0 K/uL   Eosinophils Relative 1 %   Eosinophils Absolute 0.1 0.0 - 0.7 K/uL   Basophils Relative 0 %   Basophils Absolute 0.0 0.0 - 0.1 K/uL  CBG monitoring, ED  Result Value Ref Range   Glucose-Capillary 162 (H) 65 - 99 mg/dL  I-stat Chem 8, ED  Result Value Ref Range   Sodium 134 (L) 135 - 145 mmol/L   Potassium 5.3 (H) 3.5 - 5.1 mmol/L   Chloride 101 101 - 111 mmol/L   BUN 12 6 - 20 mg/dL   Creatinine, Ser 7.82 (H) 0.61 - 1.24 mg/dL   Glucose, Bld 956 (H) 65 - 99 mg/dL   Calcium, Ion 2.13 (L) 1.15 - 1.40 mmol/L   TCO2 23 22 - 32 mmol/L   Hemoglobin 11.9 (L) 13.0 - 17.0 g/dL   HCT 08.6 (L) 57.8 - 46.9 %   Ct Extrem Up Entire Arm L W/cm  Result Date: 10/05/2017 CLINICAL DATA:  Acute onset of left arm pain. Evaluate for recurrent left arm abscess. EXAM: CT OF THE UPPER LEFT EXTREMITY WITH CONTRAST TECHNIQUE: Multidetector CT imaging of the upper left extremity was performed according to the standard protocol following intravenous contrast administration. COMPARISON:  Left shoulder CT performed 12/24/2015 CONTRAST:  ISOVUE-300  IOPAMIDOL (ISOVUE-300) INJECTION 61% FINDINGS: Bones/Joint/Cartilage There is no evidence of fracture or dislocation. No osseous erosions are seen to suggest osteomyelitis. No elbow joint effusion is identified. The left humeral head remains seated at the glenoid fossa. The cartilage is not well assessed on CT. Ligaments Suboptimally assessed by CT. Muscles and Tendons An abscess is noted extending into the musculature of the left lateral upper arm, measuring approximately 4.9 x 2.9 x 1.9 cm, multiloculated in appearance. This is partially subcutaneous in nature. The remaining musculature is unremarkable appearance. No definite tendon abnormalities are characterized. Soft tissues The visualized vasculature is grossly unremarkable. Soft tissue swelling and inflammation are noted along the left lateral upper arm. Visualized left axillary nodes are normal in size. IMPRESSION: Abscess extending into the musculature of the left lateral upper arm, measuring 4.9 x 2.9 x 1.9 cm, with mild loculations. This is partially subcutaneous in nature. Surrounding soft tissue swelling and inflammation. Electronically Signed   By: Roanna Raider M.D.   On: 10/05/2017 22:58   Radiology results reviewed and shared with patient.   Patient with multiloculated abscess of left upper arm extending into the musculature. Patient  discussed with Dr. Hilliard ClarkMesseck. Will start vancomycin and zosyn. Consult with orthopedics. Will see patient in the morning. Make NPO after noon tomorrow. Admit to hospitialist service.    Felicie MornSmith, Unique Sillas, NP 10/06/17 0021    Wynetta FinesMessick, Peter C, MD 10/06/17 [phone]

## 2017-10-05 NOTE — ED Provider Notes (Signed)
[facility] EMERGENCY DEPARTMENT Provider Note   CSN: [id number] Arrival date & time: 10/05/17  1520     History   Chief Complaint Chief Complaint  Patient presents with  . Abscess    HPI Raymond Winters is a 57 y.o. male with a past medical history of alcohol abuse, heroin use, hepatitis C, who presents to ED for evaluation of 8-day history of abscesses in the left upper and right upper arms.  Patient continues to use IV and IM heroin.  He does have a history of large abscess of the left shoulder which had to be drained surgically and skin graft placement in July and August 2017.  He states that last time the symptoms began similarly but that he "waited too long to do anything about it."  He has taken aspirin with improvement in his symptoms.  Denies any drainage, fevers.  HPI  Past Medical History:  Diagnosis Date  . Alcohol dependence ([place]) 01/05/2016  . Alcoholic liver disease, unspecified ([place]) 01/05/2016  . Arthritis    "lower extremeties, hands, shoulders, back" (08/20/2016)  . Chronic alcoholic pancreatitis ([place]) 01/05/2016  . Depression   . Family history of adverse reaction to anesthesia    pt's sister has hx. of post-op N/V  . Hepatitis C    "never treated" (08/20/2016)  . Heroin abuse ([place])   . High cholesterol   . Hypoalbuminemia due to protein-calorie malnutrition ([place]) 01/05/2016  . Insulin dependent diabetes mellitus ([place])   . Lactic acid increased, CHRONIC 01/05/2016   Secondary to Liver Disease, Alcoholism, and Diabetes  . 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 Shoulder wound 01/2016   left    Patient Active Problem List   Diagnosis Date Noted  . DKA (diabetic ketoacidoses) ([place]) 05/20/2017  . Polysubstance abuse ([place]) 05/20/2017  . Hypoglycemia associated with diabetes ([place]) 02/20/2017  . Ankle pain, right   . Ankle cellulitis 12/15/2016  . Cellulitis of foot, right 12/14/2016  . Cellulitis of right ankle 12/14/2016    . Chronic kidney disease (CKD), stage II (mild) 12/14/2016  . Non compliance w medication regimen 12/08/2016  . Necrotizing fasciitis ([place]) 08/19/2016  . Hypoglycemia 06/22/2016  . Depression 02/03/2016  . Chronic hepatitis C ([place])   . Alcoholic liver disease, unspecified ([place]) 01/05/2016  . Alcohol dependence ([place]) 01/05/2016  . Chronic alcoholic pancreatitis ([place]) 01/05/2016  . Hypoalbuminemia due to protein-calorie malnutrition ([place]) 01/05/2016  . Lactic acid increased, CHRONIC 01/05/2016  . Diarrhea 01/04/2016  . Chronic diarrhea 01/04/2016  . Pressure ulcer 01/04/2016  . Uncontrolled type 2 diabetes mellitus with complication, with long-term current use of insulin ([place])   . Swelling   . Type 2 diabetes mellitus with complication, with long-term current use of insulin ([place])   . Septic joint of left shoulder region ([place])   . Abscess 12/24/2015  . Abscess of left shoulder   . Leukocytosis   . Type 2 diabetes mellitus with complication, without long-term current use of insulin ([place])   . Glenohumeral arthritis 07/24/2015  . Left shoulder pain 07/24/2015  . 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 Tobacco abuse 07/26/2014  . Protein-calorie malnutrition, severe ([place]) 12/05/2013  . Hyperkalemia 12/04/2013  . Heroin withdrawal ([place]) 12/04/2013  . Alcohol abuse 12/04/2013  . Hyperglycemia 12/04/2013  . Metabolic acidosis with respiratory alkalosis 12/04/2013  . Heroin abuse ([place]) 04/04/2013  . DM2 (diabetes mellitus, type 2) ([place]) 03/20/2013    Past Surgical History:  Procedure Laterality Date  .  APPLICATION OF WOUND VAC Left 12/24/2015   Procedure: APPLICATION OF WOUND VAC; LEFT SHOULDER;  Surgeon: Leandrew Koyanagi, MD;  Location: [facility];  Service: Orthopedics;  Laterality: Left;  . APPLICATION OF WOUND VAC Left 01/29/2016   Procedure: LEFT SHOULDER SPLIT THICKNESS SKIN GRAFT, WOUND VAC;  Surgeon: Leandrew Koyanagi, MD;  Location: [facility];  Service: Orthopedics;  Laterality:  Left;  . I&D EXTREMITY Right 03/20/2013   Procedure: IRRIGATION AND DEBRIDEMENT EXTREMITY;  Surgeon: Schuyler Amor, MD;  Location: [facility];  Service: Orthopedics;  Laterality: Right;  . I&D EXTREMITY Left 12/24/2015   Procedure:  DEBRIDEMENT OF MUSCLE, SUBCUTANEOUS TISSUE LEFT SHOULDER;  Surgeon: Leandrew Koyanagi, MD;  Location: [facility];  Service: Orthopedics;  Laterality: Left;  . I&D EXTREMITY Right 12/18/2016   Procedure: INCISION AND DRAINAGE ANKLE ABSCESS;  Surgeon: Renette Butters, MD;  Location: [facility];  Service: Orthopedics;  Laterality: Right;  . INCISION AND DRAINAGE ABSCESS Right 08/19/2016   Procedure: DEBRIDEMENT RIGHT BUTTOCK  ABSCESS;  Surgeon: Donnie Mesa, MD;  Location: [facility];  Service: General;  Laterality: Right;  . IRRIGATION AND DEBRIDEMENT ABSCESS Right 08/19/2016   buttocks  . IRRIGATION AND DEBRIDEMENT SHOULDER Left 12/27/2015   Procedure: IRRIGATION AND DEBRIDEMENT LEFT SHOULDER;  Surgeon: Leandrew Koyanagi, MD;  Location: [facility];  Service: Orthopedics;  Laterality: Left;  . IRRIGATION AND DEBRIDEMENT SHOULDER Left 12/30/2015   Procedure: IRRIGATION AND DEBRIDEMENT LEFT SHOULDER;  Surgeon: Leandrew Koyanagi, MD;  Location: [facility];  Service: Orthopedics;  Laterality: Left;  . SKIN SPLIT GRAFT Left 01/29/2016   Procedure: SKIN GRAFT SPLIT THICKNESS,;  Surgeon: Leandrew Koyanagi, MD;  Location: [facility];  Service: Orthopedics;  Laterality: Left;        Home Medications    Prior to Admission medications   Medication Sig Start Date End Date Taking? Authorizing Provider  aspirin EC 81 MG tablet Take 1 tablet (81 mg total) by mouth daily. 04/04/14   Lance Bosch, NP  atorvastatin (LIPITOR) 40 MG tablet Take 1 tablet (40 mg total) by mouth daily. 05/26/17   Charlott Rakes, MD  Blood Glucose Monitoring Suppl (ONETOUCH VERIO FLEX SYSTEM) w/Device KIT 1 Device by Other route 3 (three) times daily. Use as instructed to check blood sugars 3 times daily before meals 05/24/17   Elayne Snare, MD  ferrous sulfate 325 (65 FE) MG tablet Take 1 tablet (325 mg total) by mouth 2 (two) times daily with a meal. 12/23/16   Reyne Dumas, MD  gabapentin (NEURONTIN) 300 MG capsule TAKE 2 CAPSULE BY MOUTH AT BEDTIME FOR DIABETIC NERVE PAIN 05/26/17   Charlott Rakes, MD  Insulin Glargine (LANTUS SOLOSTAR) 100 UNIT/ML Solostar Pen Inject 15 Units into the skin 2 (two) times daily. Patient taking differently: Inject 10 Units into the skin 2 (two) times daily.  05/24/17   Domenic Polite, MD  Insulin Pen Needle (B-D UF III MINI PEN NEEDLES) 31G X 5 MM MISC USE AS DIRECTED THREE TIMES DAILY BEFORE MEALS AND AT BEDTIME 09/27/17   Charlott Rakes, MD  Lancet Devices [facility]) lancets Use as instructed 3 times before meals 08/26/16   Newlin, Charlane Ferretti, MD  meloxicam (MOBIC) 7.5 MG tablet TAKE 1 TABLET(7.5 MG) BY MOUTH DAILY AS NEEDED FOR PAIN Patient not taking: Reported on 08/26/2017 06/17/17   Charlott Rakes, MD  mirtazapine (REMERON) 30 MG tablet Take 1 tablet (30 mg total) by mouth at bedtime. 05/26/17   Charlott Rakes,  MD  Multiple Vitamins-Minerals (MULTIVITAMIN WITH MINERALS) tablet Take 1 tablet by mouth daily.    [provider]  NOVOLOG FLEXPEN 100 UNIT/ML FlexPen Inject 0-12 Units into the skin See admin instructions. Inject 0-12 units subcutaneously before meals as per sliding scale as directed 08/25/17   [provider]  ONETOUCH VERIO test strip USE AS DIRECTED THREE TIMES DAILY BEFORE MEALS 09/15/17   Elayne Snare, MD  Pancrelipase, Lip-Prot-Amyl, (ZENPEP) 25000 units CPEP Take 25,000 Units by mouth 3 (three) times daily before meals. Patient not taking: Reported on 09/14/2017 11/03/16   Charlott Rakes, MD  polyethylene glycol (MIRALAX / GLYCOLAX) packet Take 17 g by mouth daily. Patient taking differently: Take 17 g by mouth daily as needed for mild constipation.  12/23/16   Reyne Dumas, MD  ZENPEP 25000-79000 units CPEP Take 1 capsule by mouth 3 (three) times daily.  05/24/17   [provider]    Family History Family History  Problem Relation Age of Onset  . Diabetes Mother   . Heart disease Mother   . Hypertension Mother   . Stroke Mother   . Vision loss Mother   . Heart disease Father   . Hypertension Father   . Anesthesia problems Sister        post-op N/V  . Diabetes Sister     Social History Social History   Tobacco Use  . Smoking status: Current Every Day Smoker    Packs/day: 0.50    Years: 37.00    Pack years: 18.50    Types: Cigarettes  . Smokeless tobacco: Never Used  Substance Use Topics  . Alcohol use: No    Alcohol/week: 0.0 oz    Comment: 08/20/2016 "stopped ~ 1 month ago"  . Drug use: Yes    Types: IV, Heroin    Comment: Heroin, last used 08/25/17     Allergies   Ativan [lorazepam]   Review of Systems Review of Systems  Constitutional: Negative for appetite change, chills and fever.  HENT: Negative for ear pain, rhinorrhea, sneezing and sore throat.   Eyes: Negative for photophobia and visual disturbance.  Respiratory: Negative for cough, chest tightness, shortness of breath and wheezing.   Cardiovascular: Negative for chest pain and palpitations.  Gastrointestinal: Negative for abdominal pain, blood in stool, constipation, diarrhea, nausea and vomiting.  Genitourinary: Negative for dysuria, hematuria and urgency.  Musculoskeletal: Negative for myalgias.  Skin: Positive for color change. Negative for rash.  Neurological: Negative for dizziness, weakness and light-headedness.     Physical Exam Updated Vital Signs BP 127/85 (BP Location: Right Arm)   Pulse (!) 113   Temp 98.4 F (36.9 C) (Oral)   Resp 15   Ht '5\' 7"'$  (1.702 m)   Wt 49.9 kg (110 lb)   SpO2 100%   BMI 17.23 kg/m   Physical Exam  Constitutional: He appears well-developed and well-nourished. No distress.  Cachectic. NAD.  HENT:  Head: Normocephalic and atraumatic.  Nose: Nose normal.  Eyes: Conjunctivae and EOM are normal.  Left eye exhibits no discharge. No scleral icterus.  Neck: Normal range of motion. Neck supple.  Cardiovascular: Normal rate, regular rhythm, normal heart sounds and intact distal pulses. Exam reveals no gallop and no friction rub.  No murmur heard. Pulmonary/Chest: Effort normal and breath sounds normal. No respiratory distress.  Abdominal: Soft. Bowel sounds are normal. He exhibits no distension. There is no tenderness. There is no guarding.  Musculoskeletal: Normal range of motion. He exhibits no edema.  Arms: Neurological: He is alert. He exhibits normal muscle tone. Coordination normal.  Skin: Skin is warm and dry. No rash noted. There is erythema.  Large area of significant induration and erythema over the triceps musculature of the L arm. Induration appears to extend upwards toward shoulder. No fluctuance noted. Pain with shoulder abduction posteriorly. Similar induration on R upper arm although not extending towards shoulder. No fluctuance noted here.  Psychiatric: He has a normal mood and affect.  Nursing note and vitals reviewed.    ED Treatments / Results  Labs (all labs ordered are listed, but only abnormal results are displayed) Labs Reviewed  CBG MONITORING, ED - Abnormal; Notable for the following components:      Result Value   Glucose-Capillary 162 (*)    All other components within normal limits  BASIC METABOLIC PANEL  CBC WITH DIFFERENTIAL/PLATELET  I-STAT CHEM 8, ED    EKG None  Radiology No results found.   EMERGENCY DEPARTMENT US SOFT TISSUE INTERPRETATION "Study: Limited Soft Tissue Ultrasound"  INDICATIONS: Soft tissue infection Multiple views of the body part were obtained in real-time with a multi-frequency linear probe  PERFORMED BY: Myself IMAGES ARCHIVED?: No SIDE:Left and Right  BODY PART:Upper extremity INTERPRETATION:  Cellulitis present     Procedures Procedures (including critical care time)  Medications Ordered in  ED Medications - No data to display   Initial Impression / Assessment and Plan / ED Course  I have reviewed the triage vital signs and the nursing notes.  Pertinent labs & imaging results that were available during my care of the patient were reviewed by me and considered in my medical decision making (see chart for details).     Patient presents to ED for evaluation of 8-day history of bilateral upper extremity abscesses.  Patient has a history of IV and IM heroin use.  In September and August 2017, patient was admitted for similar presentation and had to have surgical I&D for large abscess in L upper shoulder/arm.  On physical exam there is a significant area of induration noted of the left upper arm in the area of the triceps muscle.  Pain with movement of the arm. No palpable fluctuance or notable abscess on U/S.  Patient is afebrile.  He appears overall well.  CT scan of left upper extremity will be done to rule out large abscess or extension into muscle. CT and labwork pending. Care handed off to oncoming provider, Charlsie Quest, who will dispo accordingly. If negative for abscess or severe infection, will d/c home with po abx to cover for MRSA since patient unfortunately continues to inject heroin.  Patient advised to be NPO for remainder of ED course.   Portions of this note were generated with [employer]. Dictation errors may occur despite best attempts at proofreading.  Final Clinical Impressions(s) / ED Diagnoses   Final diagnoses:  None    ED Discharge Orders    None       Delia Heady, PA-C 10/05/17 1709    Valarie Merino, MD 10/06/17 1304

## 2017-10-06 ENCOUNTER — Inpatient Hospital Stay (HOSPITAL_COMMUNITY): Payer: Medicare Other

## 2017-10-06 ENCOUNTER — Encounter (HOSPITAL_COMMUNITY): Payer: Self-pay | Admitting: Internal Medicine

## 2017-10-06 DIAGNOSIS — E785 Hyperlipidemia, unspecified: Secondary | ICD-10-CM

## 2017-10-06 DIAGNOSIS — L02413 Cutaneous abscess of right upper limb: Secondary | ICD-10-CM | POA: Diagnosis not present

## 2017-10-06 DIAGNOSIS — E119 Type 2 diabetes mellitus without complications: Secondary | ICD-10-CM | POA: Diagnosis not present

## 2017-10-06 DIAGNOSIS — F1010 Alcohol abuse, uncomplicated: Secondary | ICD-10-CM | POA: Diagnosis not present

## 2017-10-06 DIAGNOSIS — Z833 Family history of diabetes mellitus: Secondary | ICD-10-CM | POA: Diagnosis not present

## 2017-10-06 DIAGNOSIS — N183 Chronic kidney disease, stage 3 unspecified: Secondary | ICD-10-CM | POA: Insufficient documentation

## 2017-10-06 DIAGNOSIS — Z720 Tobacco use: Secondary | ICD-10-CM | POA: Diagnosis not present

## 2017-10-06 DIAGNOSIS — L0291 Cutaneous abscess, unspecified: Secondary | ICD-10-CM | POA: Diagnosis present

## 2017-10-06 DIAGNOSIS — E11649 Type 2 diabetes mellitus with hypoglycemia without coma: Secondary | ICD-10-CM | POA: Diagnosis present

## 2017-10-06 DIAGNOSIS — K709 Alcoholic liver disease, unspecified: Secondary | ICD-10-CM | POA: Diagnosis present

## 2017-10-06 DIAGNOSIS — E1122 Type 2 diabetes mellitus with diabetic chronic kidney disease: Secondary | ICD-10-CM | POA: Diagnosis present

## 2017-10-06 DIAGNOSIS — Z681 Body mass index (BMI) 19 or less, adult: Secondary | ICD-10-CM | POA: Diagnosis not present

## 2017-10-06 DIAGNOSIS — K860 Alcohol-induced chronic pancreatitis: Secondary | ICD-10-CM | POA: Diagnosis present

## 2017-10-06 DIAGNOSIS — F1110 Opioid abuse, uncomplicated: Secondary | ICD-10-CM

## 2017-10-06 DIAGNOSIS — Z888 Allergy status to other drugs, medicaments and biological substances status: Secondary | ICD-10-CM | POA: Diagnosis not present

## 2017-10-06 DIAGNOSIS — D509 Iron deficiency anemia, unspecified: Secondary | ICD-10-CM | POA: Insufficient documentation

## 2017-10-06 DIAGNOSIS — Z821 Family history of blindness and visual loss: Secondary | ICD-10-CM | POA: Diagnosis not present

## 2017-10-06 DIAGNOSIS — E43 Unspecified severe protein-calorie malnutrition: Secondary | ICD-10-CM | POA: Diagnosis not present

## 2017-10-06 DIAGNOSIS — F1910 Other psychoactive substance abuse, uncomplicated: Secondary | ICD-10-CM

## 2017-10-06 DIAGNOSIS — E7800 Pure hypercholesterolemia, unspecified: Secondary | ICD-10-CM | POA: Diagnosis present

## 2017-10-06 DIAGNOSIS — L02414 Cutaneous abscess of left upper limb: Secondary | ICD-10-CM | POA: Diagnosis not present

## 2017-10-06 DIAGNOSIS — D638 Anemia in other chronic diseases classified elsewhere: Secondary | ICD-10-CM | POA: Diagnosis present

## 2017-10-06 DIAGNOSIS — I129 Hypertensive chronic kidney disease with stage 1 through stage 4 chronic kidney disease, or unspecified chronic kidney disease: Secondary | ICD-10-CM | POA: Diagnosis present

## 2017-10-06 DIAGNOSIS — F17210 Nicotine dependence, cigarettes, uncomplicated: Secondary | ICD-10-CM | POA: Diagnosis present

## 2017-10-06 DIAGNOSIS — Z8249 Family history of ischemic heart disease and other diseases of the circulatory system: Secondary | ICD-10-CM | POA: Diagnosis not present

## 2017-10-06 DIAGNOSIS — B1920 Unspecified viral hepatitis C without hepatic coma: Secondary | ICD-10-CM | POA: Diagnosis present

## 2017-10-06 DIAGNOSIS — Z823 Family history of stroke: Secondary | ICD-10-CM | POA: Diagnosis not present

## 2017-10-06 DIAGNOSIS — Z794 Long term (current) use of insulin: Secondary | ICD-10-CM | POA: Diagnosis not present

## 2017-10-06 DIAGNOSIS — F1020 Alcohol dependence, uncomplicated: Secondary | ICD-10-CM | POA: Diagnosis present

## 2017-10-06 LAB — BASIC METABOLIC PANEL
Anion gap: 9 (ref 5–15)
BUN: 10 mg/dL (ref 6–20)
CHLORIDE: 106 mmol/L (ref 101–111)
CO2: 21 mmol/L — AB (ref 22–32)
CREATININE: 1.23 mg/dL (ref 0.61–1.24)
Calcium: 7.5 mg/dL — ABNORMAL LOW (ref 8.9–10.3)
GFR calc Af Amer: >60 mL/min (ref >60)
GFR calc non Af Amer: >60 mL/min (ref >60)
Glucose, Bld: 204 mg/dL — ABNORMAL HIGH (ref 65–99)
POTASSIUM: 4 mmol/L (ref 3.5–5.1)
Sodium: 136 mmol/L (ref 135–145)

## 2017-10-06 LAB — CBG MONITORING, ED
GLUCOSE-CAPILLARY: 61 mg/dL — AB (ref 65–99)
Glucose-Capillary: 322 mg/dL — ABNORMAL HIGH (ref 65–99)
Glucose-Capillary: 116 mg/dL — ABNORMAL HIGH (ref 65–99)
Glucose-Capillary: 85 mg/dL (ref 65–99)

## 2017-10-06 LAB — RAPID URINE DRUG SCREEN, HOSP PERFORMED
Amphetamines: NOT DETECTED
BARBITURATES: NOT DETECTED
BENZODIAZEPINES: NOT DETECTED
COCAINE: POSITIVE — AB
Opiates: POSITIVE — AB
TETRAHYDROCANNABINOL: NOT DETECTED

## 2017-10-06 LAB — HEPATIC FUNCTION PANEL
ALT: 19 U/L (ref 17–63)
AST: 35 U/L (ref 15–41)
Albumin: 2.7 g/dL — ABNORMAL LOW (ref 3.5–5.0)
Alkaline Phosphatase: 92 U/L (ref 38–126)
BILIRUBIN DIRECT: 0.1 mg/dL (ref 0.1–0.5)
BILIRUBIN TOTAL: 0.5 mg/dL (ref 0.3–1.2)
Indirect Bilirubin: 0.4 mg/dL (ref 0.3–0.9)
Total Protein: 6.5 g/dL (ref 6.5–8.1)

## 2017-10-06 LAB — CBC
HEMATOCRIT: 27.4 % — AB (ref 39.0–52.0)
Hemoglobin: 8.8 g/dL — ABNORMAL LOW (ref 13.0–17.0)
MCH: 24.9 pg — AB (ref 26.0–34.0)
MCHC: 32.1 g/dL (ref 30.0–36.0)
MCV: 77.6 fL — AB (ref 78.0–100.0)
PLATELETS: 157 10*3/uL (ref 150–400)
RBC: 3.53 MIL/uL — ABNORMAL LOW (ref 4.22–5.81)
RDW: 14.2 % (ref 11.5–15.5)
WBC: 4.5 10*3/uL (ref 4.0–10.5)

## 2017-10-06 LAB — PROTIME-INR
INR: 1.1
PROTHROMBIN TIME: 14.1 s (ref 11.4–15.2)

## 2017-10-06 LAB — HIV ANTIBODY (ROUTINE TESTING W REFLEX): HIV Screen 4th Generation wRfx: NONREACTIVE

## 2017-10-06 LAB — LACTIC ACID, PLASMA: LACTIC ACID, VENOUS: 3.7 mmol/L — AB (ref 0.5–1.9)

## 2017-10-06 LAB — APTT: aPTT: 31 seconds (ref 24–36)

## 2017-10-06 LAB — PROCALCITONIN: PROCALCITONIN: 0.28 ng/mL

## 2017-10-06 MED ORDER — ACETAMINOPHEN 325 MG PO TABS
650.0000 mg | ORAL_TABLET | Freq: Four times a day (QID) | ORAL | Status: DC | PRN
  Administered 2017-10-07: 650 mg via ORAL
  Filled 2017-10-06: qty 2

## 2017-10-06 MED ORDER — INSULIN GLARGINE 100 UNIT/ML ~~LOC~~ SOLN
10.0000 [IU] | Freq: Two times a day (BID) | SUBCUTANEOUS | Status: DC
  Administered 2017-10-06 (×2): 10 [IU] via SUBCUTANEOUS
  Filled 2017-10-06 (×4): qty 0.1

## 2017-10-06 MED ORDER — ASPIRIN EC 81 MG PO TBEC
81.0000 mg | DELAYED_RELEASE_TABLET | Freq: Every day | ORAL | Status: DC
  Administered 2017-10-06 – 2017-10-08 (×3): 81 mg via ORAL
  Filled 2017-10-06 (×3): qty 1

## 2017-10-06 MED ORDER — LIDOCAINE HCL (PF) 1 % IJ SOLN
INTRAMUSCULAR | Status: AC
  Filled 2017-10-06: qty 30

## 2017-10-06 MED ORDER — GABAPENTIN 300 MG PO CAPS
600.0000 mg | ORAL_CAPSULE | Freq: Every day | ORAL | Status: DC
  Administered 2017-10-06 – 2017-10-07 (×3): 600 mg via ORAL
  Filled 2017-10-06 (×3): qty 2

## 2017-10-06 MED ORDER — THIAMINE HCL 100 MG/ML IJ SOLN: 100.0000 mg | Freq: Every day | INTRAMUSCULAR | Status: DC

## 2017-10-06 MED ORDER — SODIUM CHLORIDE 0.9 % IV BOLUS
1500.0000 mL | Freq: Once | INTRAVENOUS | Status: AC
  Administered 2017-10-06: 1500 mL via INTRAVENOUS

## 2017-10-06 MED ORDER — SODIUM CHLORIDE 0.9 % IV SOLN
INTRAVENOUS | Status: DC
  Administered 2017-10-06 – 2017-10-07 (×4): via INTRAVENOUS

## 2017-10-06 MED ORDER — ATORVASTATIN CALCIUM 40 MG PO TABS
40.0000 mg | ORAL_TABLET | Freq: Every day | ORAL | Status: DC
  Administered 2017-10-06 – 2017-10-08 (×3): 40 mg via ORAL
  Filled 2017-10-06 (×4): qty 1

## 2017-10-06 MED ORDER — FENTANYL CITRATE (PF) 100 MCG/2ML IJ SOLN
50.0000 ug | Freq: Once | INTRAMUSCULAR | Status: AC
  Administered 2017-10-06: 50 ug via INTRAVENOUS
  Filled 2017-10-06: qty 2

## 2017-10-06 MED ORDER — FERROUS SULFATE 325 (65 FE) MG PO TABS
325.0000 mg | ORAL_TABLET | Freq: Two times a day (BID) | ORAL | Status: DC
  Administered 2017-10-06 – 2017-10-08 (×5): 325 mg via ORAL
  Filled 2017-10-06 (×6): qty 1

## 2017-10-06 MED ORDER — NICOTINE 21 MG/24HR TD PT24
21.0000 mg | MEDICATED_PATCH | Freq: Every day | TRANSDERMAL | Status: DC
  Administered 2017-10-07: 21 mg via TRANSDERMAL
  Filled 2017-10-06 (×3): qty 1

## 2017-10-06 MED ORDER — SENNOSIDES-DOCUSATE SODIUM 8.6-50 MG PO TABS: 1.0000 | ORAL_TABLET | Freq: Every evening | ORAL | Status: DC | PRN

## 2017-10-06 MED ORDER — PIPERACILLIN-TAZOBACTAM 3.375 G IVPB
3.3750 g | Freq: Three times a day (TID) | INTRAVENOUS | Status: DC
  Administered 2017-10-06 – 2017-10-07 (×5): 3.375 g via INTRAVENOUS
  Filled 2017-10-06 (×6): qty 50

## 2017-10-06 MED ORDER — ONDANSETRON HCL 4 MG/2ML IJ SOLN
4.0000 mg | Freq: Four times a day (QID) | INTRAMUSCULAR | Status: DC | PRN
Start: 2017-10-06 — End: 2017-10-08
  Administered 2017-10-07: 4 mg via INTRAVENOUS
  Filled 2017-10-06: qty 2

## 2017-10-06 MED ORDER — VANCOMYCIN HCL 500 MG IV SOLR
500.0000 mg | Freq: Two times a day (BID) | INTRAVENOUS | Status: DC
  Administered 2017-10-06 – 2017-10-08 (×4): 500 mg via INTRAVENOUS
  Filled 2017-10-06 (×5): qty 500

## 2017-10-06 MED ORDER — ADULT MULTIVITAMIN W/MINERALS CH
1.0000 | ORAL_TABLET | Freq: Every day | ORAL | Status: DC
  Administered 2017-10-06 – 2017-10-08 (×3): 1 via ORAL
  Filled 2017-10-06 (×3): qty 1

## 2017-10-06 MED ORDER — ONDANSETRON HCL 4 MG PO TABS: 4.0000 mg | ORAL_TABLET | Freq: Four times a day (QID) | ORAL | Status: DC | PRN

## 2017-10-06 MED ORDER — MIRTAZAPINE 15 MG PO TABS
30.0000 mg | ORAL_TABLET | Freq: Every day | ORAL | Status: DC
  Administered 2017-10-06 – 2017-10-07 (×3): 30 mg via ORAL
  Filled 2017-10-06 (×3): qty 2

## 2017-10-06 MED ORDER — INSULIN GLARGINE 100 UNIT/ML ~~LOC~~ SOLN
5.0000 [IU] | Freq: Two times a day (BID) | SUBCUTANEOUS | Status: DC
Start: 2017-10-06 — End: 2017-10-07
  Filled 2017-10-06 (×3): qty 0.05

## 2017-10-06 MED ORDER — INSULIN ASPART 100 UNIT/ML ~~LOC~~ SOLN
0.0000 [IU] | Freq: Three times a day (TID) | SUBCUTANEOUS | Status: DC
  Administered 2017-10-07 – 2017-10-08 (×2): 3 [IU] via SUBCUTANEOUS
  Administered 2017-10-08: 5 [IU] via SUBCUTANEOUS

## 2017-10-06 MED ORDER — OXYCODONE-ACETAMINOPHEN 5-325 MG PO TABS
1.0000 | ORAL_TABLET | ORAL | Status: DC | PRN
  Administered 2017-10-06 – 2017-10-08 (×10): 1 via ORAL
  Filled 2017-10-06 (×10): qty 1

## 2017-10-06 MED ORDER — SODIUM CHLORIDE 0.9 % IV BOLUS
500.0000 mL | Freq: Once | INTRAVENOUS | Status: AC
  Administered 2017-10-06: 500 mL via INTRAVENOUS

## 2017-10-06 MED ORDER — POLYETHYLENE GLYCOL 3350 17 G PO PACK
17.0000 g | PACK | Freq: Every day | ORAL | Status: DC | PRN
Start: 2017-10-06 — End: 2017-10-08

## 2017-10-06 MED ORDER — VITAMIN B-1 100 MG PO TABS
100.0000 mg | ORAL_TABLET | Freq: Every day | ORAL | Status: DC
  Administered 2017-10-06 – 2017-10-08 (×3): 100 mg via ORAL
  Filled 2017-10-06 (×4): qty 1

## 2017-10-06 MED ORDER — LIDOCAINE-EPINEPHRINE 1 %-1:100000 IJ SOLN
10.0000 mL | Freq: Once | INTRAMUSCULAR | Status: AC
  Administered 2017-10-06: 10 mL via INTRADERMAL
  Filled 2017-10-06: qty 10

## 2017-10-06 MED ORDER — DIAZEPAM 5 MG/ML IJ SOLN
2.5000 mg | Freq: Four times a day (QID) | INTRAMUSCULAR | Status: DC | PRN
  Administered 2017-10-08: 2.5 mg via INTRAVENOUS
  Filled 2017-10-06: qty 2

## 2017-10-06 MED ORDER — ACETAMINOPHEN 650 MG RE SUPP: 650.0000 mg | Freq: Four times a day (QID) | RECTAL | Status: DC | PRN

## 2017-10-06 MED ORDER — ZOLPIDEM TARTRATE 5 MG PO TABS: 5.0000 mg | ORAL_TABLET | Freq: Every evening | ORAL | Status: DC | PRN

## 2017-10-06 MED ORDER — FOLIC ACID 1 MG PO TABS
1.0000 mg | ORAL_TABLET | Freq: Every day | ORAL | Status: DC
  Administered 2017-10-06 – 2017-10-08 (×3): 1 mg via ORAL
  Filled 2017-10-06 (×3): qty 1

## 2017-10-06 MED ORDER — PANCRELIPASE (LIP-PROT-AMYL) 12000-38000 UNITS PO CPEP
24000.0000 [IU] | ORAL_CAPSULE | Freq: Three times a day (TID) | ORAL | Status: DC
  Administered 2017-10-06 – 2017-10-08 (×7): 24000 [IU] via ORAL
  Filled 2017-10-06 (×11): qty 2

## 2017-10-06 NOTE — ED Notes (Signed)
CBG-67. Pt given coke and graham crackers. Will check again in 15 minutes

## 2017-10-06 NOTE — Consult Note (Signed)
Reason for Consult:Arm abscess Referring Physician: [name] is an 58 y.o. male with IVDU, DM, chronic pancreatitis, dyslipidemia. HPI: Raymond Winters has been struggling with left upper arm pain and swelling for about 2 weeks. It has waxed and waned in severity but got worse last night and he decided to seek evaluation in the ED. He denies any constitutional symptoms such as fevers, chills, sweats, N/V. He injects heroin almost daily, usually in the left arm. He is RHD.  Past Medical History:  Diagnosis Date  . Alcohol dependence ([place]) 01/05/2016  . Alcoholic liver disease, unspecified ([place]) 01/05/2016  . Arthritis    "lower extremeties, hands, shoulders, back" (08/20/2016)  . Chronic alcoholic pancreatitis ([place]) 01/05/2016  . CKD (chronic kidney disease), stage III ([place])   . Depression   . Family history of adverse reaction to anesthesia    pt's sister has hx. of post-op N/V  . Hepatitis C    "never treated" (08/20/2016)  . Heroin abuse ([place])   . High cholesterol   . Hypoalbuminemia due to protein-calorie malnutrition ([place]) 01/05/2016  . Insulin dependent diabetes mellitus ([place])   . Lactic acid increased, CHRONIC 01/05/2016   Secondary to Liver Disease, Alcoholism, and Diabetes  . 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 Shoulder wound 01/2016   left    Past Surgical History:  Procedure Laterality Date  . APPLICATION OF WOUND VAC Left 12/24/2015   Procedure: APPLICATION OF WOUND VAC; LEFT SHOULDER;  Surgeon: Leandrew Koyanagi, MD;  Location: [facility];  Service: Orthopedics;  Laterality: Left;  . APPLICATION OF WOUND VAC Left 01/29/2016   Procedure: LEFT SHOULDER SPLIT THICKNESS SKIN GRAFT, WOUND VAC;  Surgeon: Leandrew Koyanagi, MD;  Location: [facility];  Service: Orthopedics;  Laterality: Left;  . I&D EXTREMITY Right 03/20/2013   Procedure: IRRIGATION AND DEBRIDEMENT EXTREMITY;  Surgeon: Schuyler Amor, MD;  Location: [facility];  Service: Orthopedics;   Laterality: Right;  . I&D EXTREMITY Left 12/24/2015   Procedure:  DEBRIDEMENT OF MUSCLE, SUBCUTANEOUS TISSUE LEFT SHOULDER;  Surgeon: Leandrew Koyanagi, MD;  Location: [facility];  Service: Orthopedics;  Laterality: Left;  . I&D EXTREMITY Right 12/18/2016   Procedure: INCISION AND DRAINAGE ANKLE ABSCESS;  Surgeon: Renette Butters, MD;  Location: [facility];  Service: Orthopedics;  Laterality: Right;  . INCISION AND DRAINAGE ABSCESS Right 08/19/2016   Procedure: DEBRIDEMENT RIGHT BUTTOCK  ABSCESS;  Surgeon: Donnie Mesa, MD;  Location: [facility];  Service: General;  Laterality: Right;  . IRRIGATION AND DEBRIDEMENT ABSCESS Right 08/19/2016   buttocks  . IRRIGATION AND DEBRIDEMENT SHOULDER Left 12/27/2015   Procedure: IRRIGATION AND DEBRIDEMENT LEFT SHOULDER;  Surgeon: Leandrew Koyanagi, MD;  Location: [facility];  Service: Orthopedics;  Laterality: Left;  . IRRIGATION AND DEBRIDEMENT SHOULDER Left 12/30/2015   Procedure: IRRIGATION AND DEBRIDEMENT LEFT SHOULDER;  Surgeon: Leandrew Koyanagi, MD;  Location: [facility];  Service: Orthopedics;  Laterality: Left;  . SKIN SPLIT GRAFT Left 01/29/2016   Procedure: SKIN GRAFT SPLIT THICKNESS,;  Surgeon: Leandrew Koyanagi, MD;  Location: [facility];  Service: Orthopedics;  Laterality: Left;    Family History  Problem Relation Age of Onset  . Diabetes Mother   . Heart disease Mother   . Hypertension Mother   . Stroke Mother   . Vision loss Mother   . Heart disease Father   . Hypertension Father   . Anesthesia problems Sister  post-op N/V  . Diabetes Sister     Social History:  reports that he has been smoking cigarettes.  He has a 18.50 pack-year smoking history. He has never used smokeless tobacco. He reports that he has current or past drug history. Drugs: IV and Heroin. He reports that he does not drink alcohol.  Allergies:  Allergies  Allergen Reactions  . Ativan [Lorazepam] Other (See Comments)    HALLUCINATIONS    Medications: I have reviewed the patient's  current medications.  Results for orders placed or performed during the hospital encounter of 10/05/17 (from the past 48 hour(s))  CBG monitoring, ED     Status: Abnormal   Collection Time: 10/05/17  3:37 PM  Result Value Ref Range   Glucose-Capillary 162 (H) 65 - 99 mg/dL  I-stat Chem 8, ED     Status: Abnormal   Collection Time: 10/05/17  5:28 PM  Result Value Ref Range   Sodium 134 (L) 135 - 145 mmol/L   Potassium 5.3 (H) 3.5 - 5.1 mmol/L   Chloride 101 101 - 111 mmol/L   BUN 12 6 - 20 mg/dL   Creatinine, Ser 1.40 (H) 0.61 - 1.24 mg/dL   Glucose, Bld 262 (H) 65 - 99 mg/dL   Calcium, Ion 1.08 (L) 1.15 - 1.40 mmol/L   TCO2 23 22 - 32 mmol/L   Hemoglobin 11.9 (L) 13.0 - 17.0 g/dL   HCT 35.0 (L) 39.0 - 85.2 %  Basic metabolic panel     Status: Abnormal   Collection Time: 10/05/17  9:20 PM  Result Value Ref Range   Sodium 133 (L) 135 - 145 mmol/L   Potassium 5.0 3.5 - 5.1 mmol/L    Comment: SLIGHT HEMOLYSIS   Chloride 101 101 - 111 mmol/L   CO2 21 (L) 22 - 32 mmol/L   Glucose, Bld 216 (H) 65 - 99 mg/dL   BUN 11 6 - 20 mg/dL   Creatinine, Ser 1.40 (H) 0.61 - 1.24 mg/dL   Calcium 8.4 (L) 8.9 - 10.3 mg/dL   GFR calc non Af Amer 54 (L) >60 mL/min   GFR calc Af Amer >60 >60 mL/min    Comment: (NOTE) The eGFR has been calculated using the CKD EPI equation. This calculation has not been validated in all clinical situations. eGFR's persistently <60 mL/min signify possible Chronic Kidney Disease.    Anion gap 11 5 - 15    Comment: Performed at [facility] [address] [place], [place] [address]  CBC with Differential     Status: Abnormal   Collection Time: 10/05/17  9:20 PM  Result Value Ref Range   WBC 5.9 4.0 - 10.5 K/uL   RBC 4.23 4.22 - 5.81 MIL/uL   Hemoglobin 10.8 (L) 13.0 - 17.0 g/dL   HCT 33.1 (L) 39.0 - 52.0 %   MCV 78.3 78.0 - 100.0 fL   MCH 25.5 (L) 26.0 - 34.0 pg   MCHC 32.6 30.0 - 36.0 g/dL   RDW 14.4 11.5 - 15.5 %   Platelets 157 150 - 400 K/uL    Neutrophils Relative % 65 %   Neutro Abs 3.8 1.7 - 7.7 K/uL   Lymphocytes Relative 29 %   Lymphs Abs 1.7 0.7 - 4.0 K/uL   Monocytes Relative 5 %   Monocytes Absolute 0.3 0.1 - 1.0 K/uL   Eosinophils Relative 1 %   Eosinophils Absolute 0.1 0.0 - 0.7 K/uL   Basophils Relative 0 %   Basophils Absolute  0.0 0.0 - 0.1 K/uL    Comment: Performed at [facility], [place] [address] [place], [place] [address]  Lactic acid, plasma     Status: Abnormal   Collection Time: 10/06/17  1:52 AM  Result Value Ref Range   Lactic Acid, Venous 3.7 (HH) 0.5 - 1.9 mmol/L    Comment: CRITICAL RESULT CALLED TO, READ BACK BY AND VERIFIED WITH: PRUETT K,RN 10/06/17 0247 WAYK Performed at [facility] [address], [place], [place] [address]   Procalcitonin     Status: None   Collection Time: 10/06/17  1:52 AM  Result Value Ref Range   Procalcitonin 0.28 ng/mL    Comment:        Interpretation: PCT (Procalcitonin) <= 0.5 ng/mL: Systemic infection (sepsis) is not likely. Local bacterial infection is possible. (NOTE)       Sepsis PCT Algorithm           Lower Respiratory Tract                                      Infection PCT Algorithm    ----------------------------     ----------------------------         PCT < 0.25 ng/mL                PCT < 0.10 ng/mL         Strongly encourage             Strongly discourage   discontinuation of antibiotics    initiation of antibiotics    ----------------------------     -----------------------------       PCT 0.25 - 0.50 ng/mL            PCT 0.10 - 0.25 ng/mL               OR       >80% decrease in PCT            Discourage initiation of                                            antibiotics      Encourage discontinuation           of antibiotics    ----------------------------     -----------------------------         PCT >= 0.50 ng/mL              PCT 0.26 - 0.50 ng/mL               AND        <80% decrease in PCT             Encourage initiation  of                                             antibiotics       Encourage continuation           of antibiotics    ----------------------------     -----------------------------        PCT >= 0.50 ng/mL  PCT > 0.50 ng/mL               AND         increase in PCT                  Strongly encourage                                      initiation of antibiotics    Strongly encourage escalation           of antibiotics                                     -----------------------------                                           PCT <= 0.25 ng/mL                                                 OR                                        > 80% decrease in PCT                                     Discontinue / Do not initiate                                             antibiotics Performed at [facility], [address], [place], [place] [address]   Protime-INR     Status: None   Collection Time: 10/06/17  1:52 AM  Result Value Ref Range   Prothrombin Time 14.1 11.4 - 15.2 seconds   INR 1.10     Comment: Performed at [facility] [address], [place], [place] [address]  APTT     Status: None   Collection Time: 10/06/17  1:52 AM  Result Value Ref Range   aPTT 31 24 - 36 seconds    Comment: Performed at [facility] [address], [place], [place] [address]  Hepatic function panel     Status: Abnormal   Collection Time: 10/06/17  1:52 AM  Result Value Ref Range   Total Protein 6.5 6.5 - 8.1 g/dL   Albumin 2.7 (L) 3.5 - 5.0 g/dL   AST 35 15 - 41 U/L   ALT 19 17 - 63 U/L   Alkaline Phosphatase 92 38 - 126 U/L   Total Bilirubin 0.5 0.3 - 1.2 mg/dL   Bilirubin, Direct 0.1 0.1 - 0.5 mg/dL   Indirect Bilirubin 0.4 0.3 - 0.9 mg/dL    Comment: Performed at [facility] [address], [place],  [address]  HIV antibody     Status: None  Collection Time: 10/06/17  1:52 AM  Result Value Ref Range   HIV Screen 4th Generation wRfx Non  Reactive Non Reactive    Comment: (NOTE) Performed At: [facility] [place], [place] [id number] Rush Farmer MD [id number] Performed at [facility], [place] [address] [place], [place] [address]   CBG monitoring, ED     Status: Abnormal   Collection Time: 10/06/17  2:33 AM  Result Value Ref Range   Glucose-Capillary 322 (H) 65 - 99 mg/dL  Basic metabolic panel     Status: Abnormal   Collection Time: 10/06/17  3:52 AM  Result Value Ref Range   Sodium 136 135 - 145 mmol/L   Potassium 4.0 3.5 - 5.1 mmol/L   Chloride 106 101 - 111 mmol/L   CO2 21 (L) 22 - 32 mmol/L   Glucose, Bld 204 (H) 65 - 99 mg/dL   BUN 10 6 - 20 mg/dL   Creatinine, Ser 1.23 0.61 - 1.24 mg/dL   Calcium 7.5 (L) 8.9 - 10.3 mg/dL   GFR calc non Af Amer >60 >60 mL/min   GFR calc Af Amer >60 >60 mL/min    Comment: (NOTE) The eGFR has been calculated using the CKD EPI equation. This calculation has not been validated in all clinical situations. eGFR's persistently <60 mL/min signify possible Chronic Kidney Disease.    Anion gap 9 5 - 15    Comment: Performed at [facility] [address], [place], [place] [address]  CBC     Status: Abnormal   Collection Time: 10/06/17  3:52 AM  Result Value Ref Range   WBC 4.5 4.0 - 10.5 K/uL   RBC 3.53 (L) 4.22 - 5.81 MIL/uL   Hemoglobin 8.8 (L) 13.0 - 17.0 g/dL   HCT 27.4 (L) 39.0 - 52.0 %   MCV 77.6 (L) 78.0 - 100.0 fL   MCH 24.9 (L) 26.0 - 34.0 pg   MCHC 32.1 30.0 - 36.0 g/dL   RDW 14.2 11.5 - 15.5 %   Platelets 157 150 - 400 K/uL    Comment: Performed at [facility], [facility] [address] [place],  [address]  Rapid urine drug screen (hospital performed)     Status: Abnormal   Collection Time: 10/06/17  5:44 AM  Result Value Ref Range   Opiates POSITIVE (A) NONE DETECTED   Cocaine POSITIVE (A) NONE DETECTED   Benzodiazepines NONE DETECTED NONE DETECTED   Amphetamines NONE DETECTED NONE DETECTED   Tetrahydrocannabinol  NONE DETECTED NONE DETECTED   Barbiturates NONE DETECTED NONE DETECTED    Comment: (NOTE) DRUG SCREEN FOR MEDICAL PURPOSES ONLY.  IF CONFIRMATION IS NEEDED FOR ANY PURPOSE, NOTIFY LAB WITHIN 5 DAYS. LOWEST DETECTABLE LIMITS FOR URINE DRUG SCREEN Drug Class                     Cutoff (ng/mL) Amphetamine and metabolites    1000 Barbiturate and metabolites    200 Benzodiazepine                 106 Tricyclics and metabolites     300 Opiates and metabolites        300 Cocaine and metabolites        300 THC                            50 Performed at [facility], [place] [address],  [place], [place] [address]   CBG monitoring, ED     Status: Abnormal   Collection Time: 10/06/17  7:33 AM  Result Value Ref Range   Glucose-Capillary 116 (H) 65 - 99 mg/dL  CBG monitoring, ED     Status: None   Collection Time: 10/06/17 11:28 AM  Result Value Ref Range   Glucose-Capillary 85 65 - 99 mg/dL    Ct Extrem Up Entire Arm L W/cm  Result Date: 10/05/2017 CLINICAL DATA:  Acute onset of left arm pain. Evaluate for recurrent left arm abscess. EXAM: CT OF THE UPPER LEFT EXTREMITY WITH CONTRAST TECHNIQUE: Multidetector CT imaging of the upper left extremity was performed according to the standard protocol following intravenous contrast administration. COMPARISON:  Left shoulder CT performed 12/24/2015 CONTRAST:  145m ISOVUE-300 IOPAMIDOL (ISOVUE-300) INJECTION 61% FINDINGS: Bones/Joint/Cartilage There is no evidence of fracture or dislocation. No osseous erosions are seen to suggest osteomyelitis. No elbow joint effusion is identified. The left humeral head remains seated at the glenoid fossa. The cartilage is not well assessed on CT. Ligaments Suboptimally assessed by CT. Muscles and Tendons An abscess is noted extending into the musculature of the left lateral upper arm, measuring approximately 4.9 x 2.9 x 1.9 cm, multiloculated in appearance. This is partially subcutaneous in nature. The remaining  musculature is unremarkable appearance. No definite tendon abnormalities are characterized. Soft tissues The visualized vasculature is grossly unremarkable. Soft tissue swelling and inflammation are noted along the left lateral upper arm. Visualized left axillary nodes are normal in size. IMPRESSION: Abscess extending into the musculature of the left lateral upper arm, measuring 4.9 x 2.9 x 1.9 cm, with mild loculations. This is partially subcutaneous in nature. Surrounding soft tissue swelling and inflammation. Electronically Signed   By: JGarald BaldingM.D.   On: 10/05/2017 22:58    Review of Systems  Constitutional: Negative for weight loss.  HENT: Negative for ear discharge, ear pain, hearing loss and tinnitus.   Eyes: Negative for blurred vision, double vision, photophobia and pain.  Respiratory: Negative for cough, sputum production and shortness of breath.   Cardiovascular: Negative for chest pain.  Gastrointestinal: Negative for abdominal pain, nausea and vomiting.  Genitourinary: Negative for dysuria, flank pain, frequency and urgency.  Musculoskeletal: Positive for joint pain (Left upper arm). Negative for back pain, falls, myalgias and neck pain.  Neurological: Negative for dizziness, tingling, sensory change, focal weakness, loss of consciousness and headaches.  Endo/Heme/Allergies: Does not bruise/bleed easily.  Psychiatric/Behavioral: Negative for depression, memory loss and substance abuse. The patient is not nervous/anxious.    Blood pressure 129/90, pulse 69, temperature 98.5 F (36.9 C), temperature source Oral, resp. rate 16, height 5' 7" (1.702 m), weight 49.9 kg (110 lb), SpO2 100 %. Physical Exam  Constitutional: He appears well-developed and well-nourished. No distress.  HENT:  Head: Normocephalic and atraumatic.  Eyes: Conjunctivae are normal. Right eye exhibits no discharge. Left eye exhibits no discharge. No scleral icterus.  Neck: Normal range of motion.   Cardiovascular: Normal rate and regular rhythm.  Respiratory: Effort normal. No respiratory distress.  Musculoskeletal:  Left shoulder, elbow, wrist, digits- Indurated area posterolateral upper arm 7x4cm, small area of fluctuance on anteromedial aspect of induration, mod TTP, no instability, no blocks to motion  Sens  Ax/R/M/U intact  Mot   Ax/ R/ PIN/ M/ AIN/ U intact  Rad 2+  Neurological: He is alert.  Skin: Skin is warm and dry. He is not diaphoretic.  Psychiatric: He has a normal mood and  affect. His behavior is normal.    Assessment/Plan: Left upper arm infection -- Will I&D area that seems fluctuant. Suspect rest of mass is just induration. Will get [place] to make sure no deeper collections that need draining. Continue empiric IV abx, will send culture from I&D. OK for diet now.    Lisette Abu, PA-C Orthopedic Surgery [phone] 10/06/2017, 3:32 PM

## 2017-10-06 NOTE — ED Notes (Signed)
Upon arrival to the floor pt was not axo unable to follow commands or answer many question. Admitting was called an notified of possible usage of drugs. Upon returning to the room pt is now axox4 and able to answer questions without any problems and follows commands. Admitting will be notified.

## 2017-10-06 NOTE — ED Notes (Signed)
Pt told he needs urine. He was given a urinal at bedside.

## 2017-10-06 NOTE — Progress Notes (Signed)
Inpatient Diabetes Program Recommendations  AACE/ADA: New Consensus Statement on Inpatient Glycemic Control (2015)  Target Ranges:  Prepandial:   less than 140 mg/dL      Peak postprandial:   less than 180 mg/dL (1-2 hours)      Critically ill patients:  140 - 180 mg/dL    Spoke with patient about diabetes and home regimen for diabetes control. Patient reports that he is followed by Dr. Lucianne MussKumar, Endocrinologist for diabetes management. Patient reports taking insulin as prescribed and not missing doses. [facility] A1c of 13.6% on 3/26. Follow up appointment made with Dr. Lucianne MussKumar on 5/15 per office CMA note. Patient also has an outpatient education appointment with Oran ReinLaura Jobe for Diet education and counseling for 4/23.   Spoke with patient about current A1c level. Discussed glucose and A1C goals. Discussed importance of checking CBGs and maintaining good CBG control to prevent long-term and short-term complications.   Patient reports checking his glucose twice a day and reports his glucose runs in the 100-200 range. Patient reports not following a DM diet, eating a lot of fried foods and eating sweets everyday (snickers bar, ice cream). Asked patient if he has received education in the past. Patient said yes. I asked patient give me an example of things he needs to change with his diet. Patient stated he needed more veggies and watch his portion sizes. Patient already reports drinking water and a lot of fruit juices. I asked patient if the juices were diet juices. He said yes. Discussed with patient to continue to check his glucose and take his medications and make sure he follows up with the education appointment and Dr. Remus BlakeKumar's visit in May.  Thanks,.  Christena DeemShannon Rachella Basden RN, MSN, BC-ADM, [facility] Inpatient Diabetes Coordinator Team Pager [phone] (8a-5p)

## 2017-10-06 NOTE — Progress Notes (Signed)
Pharmacy Antibiotic Note  Raymond ButteryGary Rohrig is a 57 y.o. male admitted on 10/05/2017 with LUE cellulitis/abcess.  Pharmacy has been consulted for Vancomycin and Zosyn  Dosing.  Vancomycin 1 g IV given in ED at  2345  Plan: Vancomycin 500 mg IV q12h Zosyn 3.375 g IV q8h    Height: 5\' 7"  (170.2 cm) Weight: 110 lb (49.9 kg) IBW/kg (Calculated) : 66.1  Temp (24hrs), Avg:98.4 F (36.9 C), Min:98.4 F (36.9 C), Max:98.4 F (36.9 C)  Recent Labs  Lab 10/05/17 1728 10/05/17 2120  WBC  --  5.9  CREATININE 1.40* 1.40*    Estimated Creatinine Clearance: 40.6 mL/min (A) (by C-G formula based on SCr of 1.4 mg/dL (H)).    Allergies  Allergen Reactions  . Ativan [Lorazepam] Other (See Comments)    HALLUCINATIONS    Aydon Swamy, Aero FleetGregory Vernon 10/06/2017 1:27 AM

## 2017-10-06 NOTE — Progress Notes (Signed)
  PROGRESS NOTE  Raymond ButteryGary Pergola LKG:[id number] DOB: 04-29-1961 DOA: 10/05/2017 PCP: Hoy RegisterNewlin, [name], MD  Brief Narrative: 57 year old man PMH diabetes mellitus, IV drug use (heroin),  large abscess left shoulder in the past that had to be surgically drained and required skin graft placement 2017, presented with left upper arm pain for about 2 weeks.  May for left arm abscess.  Assessment/Plan Left arm abscess secondary to IV drug use. --Continue IV antibiotics.  Follow-up orthopedic evaluation later today.  Keep n.p.o. for now.  IV drug use (heroin).  Urine drug screen positive for cocaine. --Recommend cessation.  Alcohol dependence per chart --CIWA.  Monitor for withdrawal.  Hepatitis C --Follow-up as an outpatient.  Diabetes mellitus type 2  --CABG labile.  Continue Lantus, sliding scale insulin.   Anemia of chronic disease.  Stable.   DVT prophylaxis: SCDs Code Status: Full Family Communication: none Disposition Plan: home    Brendia Sacksaniel Ming Kunka, MD  [facility] Direct contact: [phone] --Via amion app OR  --[email or web address]; password [email or web address]  7PM-7AM contact night coverage as above 10/06/2017, 2:29 PM  LOS: 0 days   Consultants:  Orthopedics   Procedures:    Antimicrobials:  Zosyn 4/16 >>  Vancomycin 4/16 >>  Interval history/Subjective: Feels okay.  Pain controlled.  Objective: Vitals:  Vitals:   10/06/17 1300 10/06/17 1330  BP: 132/81 129/90  Pulse:  69  Resp: 14 16  Temp:    SpO2:  100%    Exam:  Constitutional:  . Appears calm and comfortable Respiratory:  . CTA bilaterally, no w/r/r.  . Respiratory effort normal Cardiovascular:  . RRR, no m/r/g . No LE extremity edema   Skin:  . Hard area of induration left upper arm overlying the lateral aspect of the triceps. Psychiatric:  . Mental status o Mood, affect appropriate   I have personally reviewed the following:   Labs:  Basic metabolic panel unremarkable.  Hemoglobin stable,  8.8.  WBC 4.5.  Scheduled Meds: . aspirin EC  81 mg Oral Daily  . atorvastatin  40 mg Oral Daily  . ferrous sulfate  325 mg Oral BID WC  . folic acid  1 mg Oral Daily  . gabapentin  600 mg Oral QHS  . insulin aspart  0-9 Units Subcutaneous TID WC  . insulin glargine  10 Units Subcutaneous BID  . lipase/protease/amylase  24,000 Units Oral TID AC  . mirtazapine  30 mg Oral QHS  . multivitamin with minerals  1 tablet Oral Daily  . nicotine  21 mg Transdermal Daily  . thiamine  100 mg Oral Daily   Or  . thiamine  100 mg Intravenous Daily   Continuous Infusions: . sodium chloride 125 mL/hr at 10/06/17 1317  . piperacillin-tazobactam (ZOSYN)  IV 3.375 g (10/06/17 1317)  . vancomycin      Principal Problem:   Abscess of arm, left Active Problems:   Type II diabetes mellitus with renal manifestations (HCC)   Heroin abuse (HCC)   Alcohol abuse   Tobacco abuse   Polysubstance abuse (HCC)   LOS: 0 days

## 2017-10-06 NOTE — ED Notes (Signed)
Pt currently eating dinner, will check CBG again.

## 2017-10-06 NOTE — ED Notes (Signed)
Pt's CBG-44. Pt given coke. Will recheck in 15 minutes.

## 2017-10-06 NOTE — ED Notes (Signed)
Patient transported to Ultrasound 

## 2017-10-06 NOTE — Procedures (Signed)
Procedure: Left upper arm I&D  Indication: Left upper arm infection  Surgeon: Charma IgoMichael Jalysa Swopes, PA-C  Assist: None  Anesthesia: 1% plain lidocaine  EBL: None  Complications: None  Findings: After risks/benefits explained patient desires to undergo procedure. Consent obtained and time out performed. The left upper arm was sterilely prepped. A 1cm incision was made over anteromedial aspect of swelling. Small amount (<405ml) purulence encountered and obtained for culture. Needle aspiration performed over other parts of induration without withdrawal of pus. Wound packed with 1/4" iodoform packing. Pt tolerated the procedure well.    Freeman CaldronMichael J. Martina Brodbeck, PA-C Orthopedic Surgery [phone]

## 2017-10-06 NOTE — H&P (Signed)
History and Physical    Raymond Winters OYD:[id number] DOB: 1960-10-11 DOA: 10/05/2017  Referring MD/NP/PA:   PCP: Charlott Rakes, MD   Patient coming from:  The patient is coming from home.  At baseline, pt is independent for most of ADL.   Chief Complaint: left upper arm pain  HPI: Raymond Winters is a 57 y.o. male with medical history significant of hypertension, diabetes mellitus, depression, IV drug abuse, polysubstance abuse (heroin, alcohol, tobacco), iron deficiency anemia, CKD-3, depression, who presents with left upper arm pain.  Pt states that he use IV and IM heroin almost every day. He developed left upper arm pain which he thinks it is abscess about 2 week ago. He had a history of large abscess of the left shoulder which had to be drained surgically and skin graft placement in July and August 2017. The pain is constant, sharp, 8 out of 10 in severity, nonradiating. Patient denies fever, chills. No chest pain, shortness breath, cough, nausea, vomiting, diarrhea, abdominal pain, symptoms of UTI.  ED Course: pt was found to have WBC 5.9, temperature normal, tachycardia, no tachypnea, oxygen saturation 100% on room air, stable renal function. CT scan of left arm showed abscess 4.9x2.9x 1.9 cm in size. Pt is admitted to telemetry bed as inpatient. Orthopedic surgeon, Dr. Marlou Sa was consulted.  Review of Systems:   General: no fevers, chills, no body weight gain, has fatigue HEENT: no blurry vision, hearing changes or sore throat Respiratory: no dyspnea, coughing, wheezing CV: no chest pain, no palpitations GI: no nausea, vomiting, abdominal pain, diarrhea, constipation GU: no dysuria, burning on urination, increased urinary frequency, hematuria  Ext: no leg edema Neuro: no unilateral weakness, numbness, or tingling, no vision change or hearing loss Skin: has left upper arm pain MSK: No muscle spasm, no deformity, no limitation of range of movement in spin Heme: No easy bruising.  Travel  history: No recent long distant travel.  Allergy:  Allergies  Allergen Reactions  . Ativan [Lorazepam] Other (See Comments)    HALLUCINATIONS    Past Medical History:  Diagnosis Date  . Alcohol dependence ([place]) 01/05/2016  . Alcoholic liver disease, unspecified ([place]) 01/05/2016  . Arthritis    "lower extremeties, hands, shoulders, back" (08/20/2016)  . Chronic alcoholic pancreatitis ([place]) 01/05/2016  . CKD (chronic kidney disease), stage III ([place])   . Depression   . Family history of adverse reaction to anesthesia    pt's sister has hx. of post-op N/V  . Hepatitis C    "never treated" (08/20/2016)  . Heroin abuse ([place])   . High cholesterol   . Hypoalbuminemia due to protein-calorie malnutrition ([place]) 01/05/2016  . Insulin dependent diabetes mellitus ([place])   . Lactic acid increased, CHRONIC 01/05/2016   Secondary to Liver Disease, Alcoholism, and Diabetes  . 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 Shoulder wound 01/2016   left    Past Surgical History:  Procedure Laterality Date  . APPLICATION OF WOUND VAC Left 12/24/2015   Procedure: APPLICATION OF WOUND VAC; LEFT SHOULDER;  Surgeon: Leandrew Koyanagi, MD;  Location: [place];  Service: Orthopedics;  Laterality: Left;  . APPLICATION OF WOUND VAC Left 01/29/2016   Procedure: LEFT SHOULDER SPLIT THICKNESS SKIN GRAFT, WOUND VAC;  Surgeon: Leandrew Koyanagi, MD;  Location: [place];  Service: Orthopedics;  Laterality: Left;  . I&D EXTREMITY Right 03/20/2013   Procedure: IRRIGATION AND DEBRIDEMENT EXTREMITY;  Surgeon: Schuyler Amor, MD;  Location: [place];  Service: Orthopedics;  Laterality: Right;  . I&D EXTREMITY Left 12/24/2015   Procedure:  DEBRIDEMENT OF MUSCLE, SUBCUTANEOUS TISSUE LEFT SHOULDER;  Surgeon: Leandrew Koyanagi, MD;  Location: [place];  Service: Orthopedics;  Laterality: Left;  . I&D EXTREMITY Right 12/18/2016   Procedure: INCISION AND DRAINAGE ANKLE ABSCESS;  Surgeon: Renette Butters, MD;  Location:  [place];  Service: Orthopedics;  Laterality: Right;  . INCISION AND DRAINAGE ABSCESS Right 08/19/2016   Procedure: DEBRIDEMENT RIGHT BUTTOCK  ABSCESS;  Surgeon: Donnie Mesa, MD;  Location: [facility];  Service: General;  Laterality: Right;  . IRRIGATION AND DEBRIDEMENT ABSCESS Right 08/19/2016   buttocks  . IRRIGATION AND DEBRIDEMENT SHOULDER Left 12/27/2015   Procedure: IRRIGATION AND DEBRIDEMENT LEFT SHOULDER;  Surgeon: Leandrew Koyanagi, MD;  Location: [place];  Service: Orthopedics;  Laterality: Left;  . IRRIGATION AND DEBRIDEMENT SHOULDER Left 12/30/2015   Procedure: IRRIGATION AND DEBRIDEMENT LEFT SHOULDER;  Surgeon: Leandrew Koyanagi, MD;  Location: [place];  Service: Orthopedics;  Laterality: Left;  . SKIN SPLIT GRAFT Left 01/29/2016   Procedure: SKIN GRAFT SPLIT THICKNESS,;  Surgeon: Leandrew Koyanagi, MD;  Location: [facility];  Service: Orthopedics;  Laterality: Left;    Social History:  reports that he has been smoking cigarettes.  He has a 18.50 pack-year smoking history. He has never used smokeless tobacco. He reports that he has current or past drug history. Drugs: IV and Heroin. He reports that he does not drink alcohol.  Family History:  Family History  Problem Relation Age of Onset  . Diabetes Mother   . Heart disease Mother   . Hypertension Mother   . Stroke Mother   . Vision loss Mother   . Heart disease Father   . Hypertension Father   . Anesthesia problems Sister        post-op N/V  . Diabetes Sister      Prior to Admission medications   Medication Sig Start Date End Date Taking? Authorizing Provider  aspirin EC 81 MG tablet Take 1 tablet (81 mg total) by mouth daily. 04/04/14  Yes Lance Bosch, NP  atorvastatin (LIPITOR) 40 MG tablet Take 1 tablet (40 mg total) by mouth daily. 05/26/17  Yes Charlott Rakes, MD  ferrous sulfate 325 (65 FE) MG tablet Take 1 tablet (325 mg total) by mouth 2 (two) times daily with a meal. 12/23/16  Yes Abrol, Ascencion Dike, MD  gabapentin (NEURONTIN)  300 MG capsule TAKE 2 CAPSULE BY MOUTH AT BEDTIME FOR DIABETIC NERVE PAIN Patient taking differently: Take 600 mg by mouth at bedtime. TAKE 2 CAPSULE BY MOUTH AT BEDTIME FOR DIABETIC NERVE PAIN 05/26/17  Yes Charlott Rakes, MD  Insulin Glargine (LANTUS SOLOSTAR) 100 UNIT/ML Solostar Pen Inject 15 Units into the skin 2 (two) times daily. Patient taking differently: Inject 10 Units into the skin 2 (two) times daily.  05/24/17  Yes Domenic Polite, MD  meloxicam (MOBIC) 7.5 MG tablet TAKE 1 TABLET(7.5 MG) BY MOUTH DAILY AS NEEDED FOR PAIN 06/17/17  Yes Charlott Rakes, MD  mirtazapine (REMERON) 30 MG tablet Take 1 tablet (30 mg total) by mouth at bedtime. 05/26/17  Yes Charlott Rakes, MD  Multiple Vitamins-Minerals (MULTIVITAMIN WITH MINERALS) tablet Take 1 tablet by mouth daily.   Yes [provider]  Pancrelipase, Lip-Prot-Amyl, (ZENPEP) 25000 units CPEP Take 25,000 Units by mouth 3 (three) times daily before meals. 11/03/16  Yes Charlott Rakes, MD  polyethylene glycol (MIRALAX / GLYCOLAX) packet Take 17 g by mouth  daily. Patient taking differently: Take 17 g by mouth daily as needed for mild constipation.  12/23/16  Yes Reyne Dumas, MD  NOVOLOG FLEXPEN 100 UNIT/ML FlexPen Inject 0-12 Units into the skin See admin instructions. Inject 0-12 units subcutaneously before meals as per sliding scale as directed 08/25/17   [provider]    Physical Exam: Vitals:   10/06/17 0115 10/06/17 0145 10/06/17 0230 10/06/17 0322  BP: 104/72 100/70 114/73 120/79  Pulse: 74 75 76 74  Resp:    18  Temp:    98.5 F (36.9 C)  TempSrc:    Oral  SpO2: 100% 100% 100% 100%  Weight:      Height:       General: Not in acute distress HEENT:       Eyes: PERRL, EOMI, no scleral icterus.       ENT: No discharge from the ears and nose, no pharynx injection, no tonsillar enlargement.        Neck: No JVD, no bruit, no mass felt. Heme: No neck lymph node enlargement. Cardiac: S1/S2, RRR, No murmurs, No  gallops or rubs. Respiratory: No rales, wheezing, rhonchi or rubs. GI: Soft, nondistended, nontender, no rebound pain, no organomegaly, BS present. GU: No hematuria Ext: No pitting leg edema bilaterally. 2+DP/PT pulse bilaterally. Musculoskeletal: No joint deformities, No joint redness or warmth, no limitation of ROM in spin. Skin: In lateral side of left upper arm, there is a large area of induration, which is tender, non fluctuant. Has a similar induration on R upper arm. Neuro: Alert, oriented X3, cranial nerves II-XII grossly intact, moves all extremities normally. Psych: Patient is not psychotic, no suicidal or hemocidal ideation.  Labs on Admission: I have personally reviewed following labs and imaging studies  CBC: Recent Labs  Lab 10/05/17 1728 10/05/17 2120  WBC  --  5.9  NEUTROABS  --  3.8  HGB 11.9* 10.8*  HCT 35.0* 33.1*  MCV  --  78.3  PLT  --  616   Basic Metabolic Panel: Recent Labs  Lab 10/05/17 1728 10/05/17 2120  NA 134* 133*  K 5.3* 5.0  CL 101 101  CO2  --  21*  GLUCOSE 262* 216*  BUN 12 11  CREATININE 1.40* 1.40*  CALCIUM  --  8.4*   GFR: Estimated Creatinine Clearance: 40.6 mL/min (A) (by C-G formula based on SCr of 1.4 mg/dL (H)). Liver Function Tests: Recent Labs  Lab 10/06/17 0152  AST 35  ALT 19  ALKPHOS 92  BILITOT 0.5  PROT 6.5  ALBUMIN 2.7*   No results for input(s): LIPASE, AMYLASE in the last 168 hours. No results for input(s): AMMONIA in the last 168 hours. Coagulation Profile: Recent Labs  Lab 10/06/17 0152  INR 1.10   Cardiac Enzymes: No results for input(s): CKTOTAL, CKMB, CKMBINDEX, TROPONINI in the last 168 hours. BNP (last 3 results) No results for input(s): PROBNP in the last 8760 hours. HbA1C: No results for input(s): HGBA1C in the last 72 hours. CBG: Recent Labs  Lab 10/05/17 1537 10/06/17 0233  GLUCAP 162* 322*   Lipid Profile: No results for input(s): CHOL, HDL, LDLCALC, TRIG, CHOLHDL, LDLDIRECT in the  last 72 hours. Thyroid Function Tests: No results for input(s): TSH, T4TOTAL, FREET4, T3FREE, THYROIDAB in the last 72 hours. Anemia Panel: No results for input(s): VITAMINB12, FOLATE, FERRITIN, TIBC, IRON, RETICCTPCT in the last 72 hours. Urine analysis:    Component Value Date/Time   COLORURINE YELLOW 05/20/2017 1138   APPEARANCEUR HAZY (  A) 05/20/2017 1138   LABSPEC 1.018 05/20/2017 1138   PHURINE 5.0 05/20/2017 1138   GLUCOSEU >=500 (A) 05/20/2017 1138   GLUCOSEU >=1000 (A) 05/06/2017 1359   HGBUR MODERATE (A) 05/20/2017 1138   BILIRUBINUR NEGATIVE 05/20/2017 1138   BILIRUBINUR neg 09/26/2015 1531   KETONESUR 80 (A) 05/20/2017 1138   PROTEINUR NEGATIVE 05/20/2017 1138   UROBILINOGEN 0.2 05/06/2017 1359   NITRITE NEGATIVE 05/20/2017 1138   LEUKOCYTESUR SMALL (A) 05/20/2017 1138   Sepsis Labs: '@LABRCNTIP'$ (procalcitonin:4,lacticidven:4) )No results found for this or any previous visit (from the past 240 hour(s)).   Radiological Exams on Admission: Ct Extrem Up Entire Arm L W/cm  Result Date: 10/05/2017 CLINICAL DATA:  Acute onset of left arm pain. Evaluate for recurrent left arm abscess. EXAM: CT OF THE UPPER LEFT EXTREMITY WITH CONTRAST TECHNIQUE: Multidetector CT imaging of the upper left extremity was performed according to the standard protocol following intravenous contrast administration. COMPARISON:  Left shoulder CT performed 12/24/2015 CONTRAST:  159m ISOVUE-300 IOPAMIDOL (ISOVUE-300) INJECTION 61% FINDINGS: Bones/Joint/Cartilage There is no evidence of fracture or dislocation. No osseous erosions are seen to suggest osteomyelitis. No elbow joint effusion is identified. The left humeral head remains seated at the glenoid fossa. The cartilage is not well assessed on CT. Ligaments Suboptimally assessed by CT. Muscles and Tendons An abscess is noted extending into the musculature of the left lateral upper arm, measuring approximately 4.9 x 2.9 x 1.9 cm, multiloculated in  appearance. This is partially subcutaneous in nature. The remaining musculature is unremarkable appearance. No definite tendon abnormalities are characterized. Soft tissues The visualized vasculature is grossly unremarkable. Soft tissue swelling and inflammation are noted along the left lateral upper arm. Visualized left axillary nodes are normal in size. IMPRESSION: Abscess extending into the musculature of the left lateral upper arm, measuring 4.9 x 2.9 x 1.9 cm, with mild loculations. This is partially subcutaneous in nature. Surrounding soft tissue swelling and inflammation. Electronically Signed   By: JGarald BaldingM.D.   On: 10/05/2017 22:58     EKG:  Not done in ED, will get one.   Assessment/Plan Principal Problem:   Abscess of arm, left Active Problems:   Type II diabetes mellitus with renal manifestations (HCC)   Heroin abuse (HCC)   Alcohol abuse   Tobacco abuse   Polysubstance abuse (HCC)   HLD (hyperlipidemia)   Iron deficiency anemia   Abscess of left arm   Abscess of arm, left: Patient has elevated lactic acid is 3.7, but does not meet criteria for sepsis. No fever or leukocytosis. Orthopedic surgeon, Dr. DMarlou Sawas consulted by EDP.  - will admit to tele bed as inpt - Empiric antimicrobial treatment with vancomycin and Zosyn per pharmacy - PRN Zofran for nausea, and Percocet for pain - Blood cultures x 2  - ESR and CRP - will get Procalcitonin and trend lactic acid levels per sepsis protocol. - IVF: total of 2.0 L of NS bolus in ED, followed by 125 cc/h - f/u ortho recommendations in AM  Polysubstance abuse: heroin, tobacco and alcohol. -will check UDS -Did counseling about importance of quitting substance -Nicotine patch -CIWA protocol (pt is allergic to ativan. Will use prn Valium 2.5 mg q6h)  HLD: -lipitor  Type II diabetes mellitus with renal manifestations ([place]: Last A1c 13.6 on 09/14/17, poorly controled. Patient is taking Lantus at home -will decrease  Lantus dose from 15 to 10 units twice a day  -SSI  Iron deficiency anemia: hgb stable, 10.8 -f/u by  CBC  DVT ppx: SCD Code Status: Full code Family Communication: None at bed side.    Disposition Plan:  Anticipate discharge back to previous home environment Consults called:  Ortho, Dr. Marlou Sa Admission status:  Inpatient/tele      Date of Service 10/06/2017    Ivor Costa [facility] Pager [phone]  If 7PM-7AM, please contact night-coverage [email or web address] Password [email or web address] 10/06/2017, 4:55 AM

## 2017-10-07 ENCOUNTER — Other Ambulatory Visit: Payer: Self-pay

## 2017-10-07 DIAGNOSIS — E43 Unspecified severe protein-calorie malnutrition: Secondary | ICD-10-CM

## 2017-10-07 LAB — GLUCOSE, CAPILLARY
GLUCOSE-CAPILLARY: 77 mg/dL (ref 65–99)
Glucose-Capillary: 87 mg/dL (ref 65–99)
Glucose-Capillary: 30 mg/dL — CL (ref 65–99)
Glucose-Capillary: 41 mg/dL — CL (ref 65–99)
Glucose-Capillary: 76 mg/dL (ref 65–99)
Glucose-Capillary: 229 mg/dL — ABNORMAL HIGH (ref 65–99)
Glucose-Capillary: 37 mg/dL — CL (ref 65–99)
Glucose-Capillary: 289 mg/dL — ABNORMAL HIGH (ref 65–99)

## 2017-10-07 LAB — LACTIC ACID, PLASMA: Lactic Acid, Venous: 2.4 mmol/L (ref 0.5–1.9)

## 2017-10-07 LAB — CBG MONITORING, ED
GLUCOSE-CAPILLARY: 67 mg/dL (ref 65–99)
GLUCOSE-CAPILLARY: 67 mg/dL (ref 65–99)
Glucose-Capillary: 44 mg/dL — CL (ref 65–99)

## 2017-10-07 MED ORDER — SODIUM CHLORIDE 0.9 % IV BOLUS
500.0000 mL | Freq: Once | INTRAVENOUS | Status: AC
  Administered 2017-10-07: 500 mL via INTRAVENOUS

## 2017-10-07 MED ORDER — ENSURE ENLIVE PO LIQD
237.0000 mL | Freq: Two times a day (BID) | ORAL | Status: DC
  Administered 2017-10-07 – 2017-10-08 (×3): 237 mL via ORAL

## 2017-10-07 MED ORDER — GLUCOSE 4 G PO CHEW
CHEWABLE_TABLET | ORAL | Status: AC
  Administered 2017-10-07: 4 g
  Filled 2017-10-07: qty 1

## 2017-10-07 NOTE — Progress Notes (Signed)
Hypoglycemic Event  CBG: 41 Treatment: 3 glucose tabs  Symptoms: Shaky  Follow-up CBG: Time 0900 CBG Result:76  Possible Reasons for Event: Unknown  Comments/MD notified:    [place] N Amar Sippel

## 2017-10-07 NOTE — Progress Notes (Signed)
I will take down dressing tomorrow May be ready for dc am

## 2017-10-07 NOTE — Progress Notes (Signed)
CRITICAL VALUE STICKER  CRITICAL VALUE: Lactic 2.4  RECEIVER (on-site recipient of call):  DATE & TIME NOTIFIED: 10/07/17 05:25  MESSENGER (representative from lab):  MD NOTIFIED: Text Paged to Airport DriveKirby NP  TIME OF NOTIFICATION: 05:38  RESPONSE:

## 2017-10-07 NOTE — Progress Notes (Signed)
PROGRESS NOTE  Raymond Winters ZOX:[id number] DOB: [birth year] DOA: 10/05/2017 PCP: Hoy Register, MD  Brief Narrative: 57 year old man PMH diabetes mellitus, IV drug use (heroin),  large abscess left shoulder in the past that had to be surgically drained and required skin graft placement 2017, presented with left upper arm pain for about 2 weeks.  May for left arm abscess.  Assessment/Plan Left arm abscess secondary to IV drug use.  Status post I&D 4/17.  Culture pending. --Improving.  Continue IV antibiotics.  Further recommendations per orthopedics.  Would anticipate discharge probably in the next 48 hours if continues to improve.  Will narrow antibiotics to gram-positive coverage  IV drug use (heroin).  Urine drug screen positive for cocaine. --Recommend cessation.  Alcohol dependence per chart --Continue CIWA.  No evidence of withdrawal.  Hepatitis C --Follow-up as an outpatient.  Diabetes mellitus type 2  --Several episodes of hypoglycemia.  Will hold Lantus for now.  Continue sliding scale insulin.   Anemia of chronic disease.  Stable.  Severe malnutrition in the context of chronic illness --Per dietitian  DVT prophylaxis: SCDs Code Status: Full Family Communication: none Disposition Plan: home    Brendia Sacks, MD  [facility] Direct contact: [phone] --Via amion app OR  --[email or web address]; password [email or web address]  7PM-7AM contact night coverage as above 10/07/2017, 2:26 PM  LOS: 1 day   Consultants:  Orthopedics   Procedures:  4/17 Left upper arm I&D  Antimicrobials:  Zosyn 4/16 >>  Vancomycin 4/16 >>  Interval history/Subjective: Status post I&D yesterday.  Several episodes of borderline hypoglycemia.  Feels okay today but did vomit lunch earlier.  Objective: Vitals:  Vitals:   10/07/17 0145 10/07/17 0531  BP: 129/87 125/80  Pulse: 67 67  Resp: 19 17  Temp: 97.9 F (36.6 C) 97.7 F (36.5 C)  SpO2: 100% 100%    Exam:  Constitutional:    . Appears calm and comfortable Respiratory:  . CTA bilaterally, no w/r/r.  . Respiratory effort normal. Cardiovascular:  . RRR, no m/r/g . No LE extremity edema   Skin:  . Wound has packing in place.  There is no surrounding erythema.  The induration has decreased. Psychiatric:  . Mental status o Mood, affect appropriate  I have personally reviewed the following:   Labs:  Several episodes of hypoglycemia this a.m.  Lactic acid was 2.4 today.  Unclear why this laboratory study was drawn.  No evidence of sepsis.  No further workup planned.  Scheduled Meds: . aspirin EC  81 mg Oral Daily  . atorvastatin  40 mg Oral Daily  . feeding supplement (ENSURE ENLIVE)  237 mL Oral BID BM  . ferrous sulfate  325 mg Oral BID WC  . folic acid  1 mg Oral Daily  . gabapentin  600 mg Oral QHS  . insulin aspart  0-9 Units Subcutaneous TID WC  . insulin glargine  5 Units Subcutaneous BID  . lipase/protease/amylase  24,000 Units Oral TID AC  . mirtazapine  30 mg Oral QHS  . multivitamin with minerals  1 tablet Oral Daily  . nicotine  21 mg Transdermal Daily  . thiamine  100 mg Oral Daily   Or  . thiamine  100 mg Intravenous Daily   Continuous Infusions: . sodium chloride 125 mL/hr at 10/07/17 1348  . piperacillin-tazobactam (ZOSYN)  IV 3.375 g (10/07/17 1346)  . vancomycin Stopped (10/07/17 5621)    Principal Problem:   Abscess of arm, left Active Problems:   Type II diabetes mellitus  with renal manifestations (HCC)   Heroin abuse (HCC)   Alcohol abuse   Tobacco abuse   Polysubstance abuse (HCC)   LOS: 1 day

## 2017-10-07 NOTE — Progress Notes (Signed)
Hypoglycemic Event  CBG: 30  Treatment: 15 GM carbohydrate snack  Symptoms: None  Follow-up CBG: [id number] CBG Result:41  Possible Reasons for Event: Unknown  Comments/MD notified:    [place] N Lien Lyman

## 2017-10-07 NOTE — Progress Notes (Signed)
Initial Nutrition Assessment  DOCUMENTATION CODES:   Severe malnutrition in context of chronic illness  INTERVENTION:  Continue Ensure Enlive po BID, each supplement provides 350 kcal and 20 grams of protein  MVI w/ Minerals  NUTRITION DIAGNOSIS:   Severe Malnutrition related to social / environmental circumstances as evidenced by severe fat depletion, severe muscle depletion.  GOAL:   Patient will meet greater than or equal to 90% of their needs   MONITOR:   PO intake, Supplement acceptance, I & O's  REASON FOR ASSESSMENT:   Malnutrition Screening Tool    ASSESSMENT:   Raymond ButteryGary Winters is an 57 y.o. male with IVDU, DM, chronic pancreatitis, dyslipidemia. Injects heroin daily, presented with L upper arm infection now s/p I&D  Mr. Beulah GandyMix was sleepy today. Reports normally eating eggs bacon and toast for breakfast, a meat and cheese sandwich for lunch, and meat, potatoes, and greens or another vegetable for dinner. Denies recent weight loss but states he did lose some weight about a year ago. UBW 115 pounds recently. Weight appears to have fluctuated over the past 10 months between 99 - 137 pounds. Reports good appetite, ate most of breakfast.  Labs reviewed:  CBGs 41, 76, 229  Medications reviewed and include:  Iron, folic Acid, Creon, Remeron, Thiamine NS at 19525mL/hr  NUTRITION - FOCUSED PHYSICAL EXAM:    Most Recent Value  Orbital Region  Moderate depletion  Upper Arm Region  Severe depletion  Thoracic and Lumbar Region  Severe depletion  Buccal Region  Moderate depletion  Temple Region  Moderate depletion  Clavicle Bone Region  Severe depletion  Clavicle and Acromion Bone Region  Severe depletion  Scapular Bone Region  Severe depletion  Dorsal Hand  Severe depletion  Patellar Region  Severe depletion  Anterior Thigh Region  Severe depletion  Posterior Calf Region  Severe depletion       Diet Order:  Diet Carb Modified Fluid consistency: Thin; Room service  appropriate? Yes  EDUCATION NEEDS:   Not appropriate for education at this time  Skin:  Skin Assessment: Skin Integrity Issues: Skin Integrity Issues:: Incisions Incisions: L U Arm  Last BM:  10/06/2017  Height:   Ht Readings from Last 1 Encounters:  10/07/17 5\' 7"  (1.702 m)    Weight:   Wt Readings from Last 1 Encounters:  10/07/17 115 lb 1.3 oz (52.2 kg)    Ideal Body Weight:  67.27 kg  BMI:  Body mass index is 18.02 kg/m.  Estimated Nutritional Needs:   Kcal:  1600-1900 calories  Protein:  78-89 grams (1.5-1.7g/kg)  Fluid:  1.6-1.9L  Raymond AnoWilliam M. Raymond Olesky, MS, RD LDN Inpatient Clinical Dietitian Pager [phone]

## 2017-10-07 NOTE — ED Notes (Signed)
Pt's CBG- 67. Pt given another coke and graham crackers.

## 2017-10-08 LAB — GLUCOSE, CAPILLARY
GLUCOSE-CAPILLARY: 238 mg/dL — AB (ref 65–99)
Glucose-Capillary: 35 mg/dL — CL (ref 65–99)
Glucose-Capillary: 297 mg/dL — ABNORMAL HIGH (ref 65–99)

## 2017-10-08 LAB — AEROBIC CULTURE W GRAM STAIN (SUPERFICIAL SPECIMEN)

## 2017-10-08 LAB — AEROBIC CULTURE  (SUPERFICIAL SPECIMEN): CULTURE: NO GROWTH

## 2017-10-08 MED ORDER — OXYCODONE-ACETAMINOPHEN 5-325 MG PO TABS
1.0000 | ORAL_TABLET | Freq: Once | ORAL | Status: AC
  Administered 2017-10-08: 1 via ORAL
  Filled 2017-10-08: qty 1

## 2017-10-08 MED ORDER — DOXYCYCLINE HYCLATE 100 MG PO TABS: 100.0000 mg | ORAL_TABLET | Freq: Two times a day (BID) | ORAL | 0 refills | Status: DC

## 2017-10-08 MED ORDER — DOXYCYCLINE HYCLATE 100 MG PO TABS: 100.0000 mg | ORAL_TABLET | Freq: Two times a day (BID) | ORAL | Status: DC

## 2017-10-08 MED ORDER — OXYCODONE-ACETAMINOPHEN 5-325 MG PO TABS: 1.0000 | ORAL_TABLET | Freq: Three times a day (TID) | ORAL | 0 refills | Status: AC | PRN

## 2017-10-08 MED ORDER — INSULIN GLARGINE 100 UNIT/ML SOLOSTAR PEN: 10.0000 [IU] | PEN_INJECTOR | Freq: Two times a day (BID) | SUBCUTANEOUS | Status: DC

## 2017-10-08 MED ORDER — OXYCODONE-ACETAMINOPHEN 5-325 MG PO TABS: 1.0000 | ORAL_TABLET | Freq: Three times a day (TID) | ORAL | 0 refills | Status: DC | PRN

## 2017-10-08 NOTE — Progress Notes (Signed)
Jillyn HiddenGary Tuft to be D/C'd Home per MD order.  Discussed with the patient and all questions fully answered.  VSS, Skin clean, dry and intact without evidence of skin break down, no evidence of skin tears noted. IV catheter discontinued intact. Site without signs and symptoms of complications. Dressing and pressure applied.  An After Visit Summary was printed and given to the patient. Patient received prescription.  D/c education completed with patient/family including follow up instructions, medication list, d/c activities limitations if indicated, with other d/c instructions as indicated by MD - patient able to verbalize understanding, all questions fully answered.   Patient instructed to return to ED, call 911, or call MD for any changes in condition.   Patient escorted via WC, and D/C home via private auto.  Marca AnconaLaura M Crystal Scarberry 10/08/2017 2:18 PM

## 2017-10-08 NOTE — Progress Notes (Signed)
Patient is missing the lantus and doxycycline prescription. MD notified.

## 2017-10-08 NOTE — Care Management Note (Signed)
Case Management Note  Patient Details  Name: Raymond Winters [place] MRN: [id number] Date of Birth: 25-Sep-1960  Subjective/Objective:         Abscess of L arm, hx of diabetes mellitus, IV drug use (heroin),  large abscess left shoulder  surgically drained and required skin graft placement 2017.  Dani [place]onga [place] (Sister)       [phone]                  Status post I&D 4/17   PCP: Dr.Newlin/ [facility]           Action/Plan: Transition to home with home health services to follow. Pt states has transportation to get home once discharged.  Expected Discharge Date:   10/08/2017            Expected Discharge Plan:  Home w Home Health Services  In-House Referral:     Discharge planning Services  CM Consult  Post Acute Care Choice:    Choice offered to:  Patient  DME Arranged:    N/A DME Agency:   N/A  HH Arranged:   RN [facility]  Status of Service:  Completed, signed off  If discussed at Long Length of Stay Meetings, dates discussed:    Additional Comments:  Epifanio LeschesCole, Klare Criss [place], RN 10/08/2017, 10:27 AM

## 2017-10-08 NOTE — Progress Notes (Signed)
Packing changed Ok for dc to home Needs hh dressing changes q o d  - ordered F/u with me next friday

## 2017-10-08 NOTE — Discharge Summary (Signed)
Physician Discharge Summary  Raymond Winters ZOX:[id number] DOB: 1960-12-29 DOA: 10/05/2017  PCP: Hoy Register, MD  Admit date: 10/05/2017 Discharge date: 10/08/2017  Recommendations for Outpatient Follow-up:   Left arm abscess secondary to IV drug use.  Status post I&D 4/17.  Culture pending. --Home today on oral antibiotics.  Follow-up with orthopedics next week. --Home health dressing changes every other day.  Hepatitis C --Follow-up as an outpatient.   Follow-up Information    [facility] Follow up.   Specialty:  Home Health Services Contact information: [address] [place] [address] [phone]        Cammy Copa, MD Follow up on 10/15/2017.   Specialty:  Orthopedic Surgery Contact information: [address] [place] [address] [phone]            Discharge Diagnoses:  1. Left arm abscess secondary to IV drug use 2. IV drug use (heroin) 3. Alcohol dependence per chart 4. Hepatitis C 5. Diabetes mellitus type 2  6. Anemia of chronic disease 7. Severe malnutrition in the context of chronic illness   Discharge Condition: improved Disposition: home  Diet recommendation: heart healthy, diabetic diet  Filed Weights   10/05/17 1525 10/07/17 0145  Weight: 49.9 kg (110 lb) 52.2 kg (115 lb 1.3 oz)    History of present illness:  57 year old man PMH diabetes mellitus, IV drug use (heroin),  large abscess left shoulder in the past that had to be surgically drained and required skin graft placement 2017, presented with left upper arm pain for about 2 weeks.    Admitted for left arm abscess.  Hospital Course:  Patient was seen by orthopedics and underwent incision and drainage of left arm abscess.  He rapidly improved.  Culture data is pending at this point.  On the day of discharge she reported superficial abscess of his right upper extremity which is spontaneously draining.  Expect that this will resolve with  ongoing drainage and completion of antibiotics.  Left arm abscess secondary to IV drug use.  Status post I&D 4/17.  Culture pending. --Home today on oral antibiotics.  Follow-up with orthopedics next week. --Home health dressing changes every other day.  IV drug use (heroin).  Urine drug screen positive for cocaine. --Recommend cessation.  I discussed this with the patient.  Alcohol dependence per chart --No evidence of withdrawal.  Hepatitis C --Follow-up as an outpatient.  Diabetes mellitus type 2  --Several episodes of hypoglycemia, these have resolved.  Resume insulin regimen on discharge.  Anemia of chronic disease.  Stable.  Severe malnutrition in the context of chronic illness --Per dietitian  Consultants:  Orthopedics   Procedures:  4/17 Leftupper arm I&D  Antimicrobials:  Zosyn 4/16 >> 4/18  Vancomycin 4/16 >> 4/19  Doxycycline 4/19 >> 4/22  Today's assessment: S: Feels better.  Left arm is little sore.  Has a spot on his right arm. O: Vitals:  Vitals:   10/08/17 0639 10/08/17 0639  BP:  (!) 130/95  Pulse:  64  Resp:  16  Temp: 98 F (36.7 C)   SpO2:  100%    Constitutional:  . Appears calm and comfortable Respiratory:  . CTA bilaterally, no w/r/r.  . Respiratory effort normal Cardiovascular:  . RRR, no m/r/g Skin:  . Right upper arm, very superficial bulla, spontaneously draining pus. Psychiatric:  . Mental status o Mood, affect appropriate   Discharge Instructions  Discharge Instructions    Activity as tolerated - No restrictions  Complete by:  As directed    Diet - low sodium heart healthy   Complete by:  As directed    Discharge instructions   Complete by:  As directed    Call your physician or seek immediate medical attention for pain, swelling, redness or worsening of condition. Be sure to finish your antibiotic. Do not drink alcohol or use illegal drugs.     Allergies as of 10/08/2017      Reactions   Ativan  [lorazepam] Other (See Comments)   HALLUCINATIONS      Medication List    STOP taking these medications   meloxicam 7.5 MG tablet Commonly known as:  MOBIC     TAKE these medications   aspirin EC 81 MG tablet Take 1 tablet (81 mg total) by mouth daily.   atorvastatin 40 MG tablet Commonly known as:  LIPITOR Take 1 tablet (40 mg total) by mouth daily.   doxycycline 100 MG tablet Commonly known as:  VIBRA-TABS Take 1 tablet (100 mg total) by mouth every 12 (twelve) hours.   ferrous sulfate 325 (65 FE) MG tablet Take 1 tablet (325 mg total) by mouth 2 (two) times daily with a meal.   gabapentin 300 MG capsule Commonly known as:  NEURONTIN TAKE 2 CAPSULE BY MOUTH AT BEDTIME FOR DIABETIC NERVE PAIN What changed:    how much to take  how to take this  when to take this  additional instructions   Insulin Glargine 100 UNIT/ML Solostar Pen Commonly known as:  LANTUS SOLOSTAR Inject 10 Units into the skin 2 (two) times daily.   mirtazapine 30 MG tablet Commonly known as:  REMERON Take 1 tablet (30 mg total) by mouth at bedtime.   multivitamin with minerals tablet Take 1 tablet by mouth daily.   NOVOLOG FLEXPEN 100 UNIT/ML FlexPen Generic drug:  insulin aspart Inject 0-12 Units into the skin See admin instructions. Inject 0-12 units subcutaneously before meals as per sliding scale as directed   oxyCODONE-acetaminophen 5-325 MG tablet Commonly known as:  PERCOCET/ROXICET Take 1 tablet by mouth every 8 (eight) hours as needed for up to 3 days for severe pain.   Pancrelipase (Lip-Prot-Amyl) 25000 units Cpep Commonly known as:  ZENPEP Take 25,000 Units by mouth 3 (three) times daily before meals.   polyethylene glycol packet Commonly known as:  MIRALAX / GLYCOLAX Take 17 g by mouth daily. What changed:    when to take this  reasons to take this      Allergies  Allergen Reactions  . Ativan [Lorazepam] Other (See Comments)    HALLUCINATIONS    The results  of significant diagnostics from this hospitalization (including imaging, microbiology, ancillary and laboratory) are listed below for reference.    Significant Diagnostic Studies: [place] Lt Upper Extrem Ltd Soft Tissue Non Vascular  Result Date: 10/06/2017 CLINICAL DATA:  Left arm abscess EXAM: ULTRASOUND left UPPER EXTREMITY LIMITED TECHNIQUE: Ultrasound examination of the upper extremity soft tissues was performed in the area of clinical concern. COMPARISON:  CT left arm for 16 2019 FINDINGS: Hypoechoic structure in the left upper arm laterally measures 2.2 x 2.6 cm. Surrounding tissues are hyperechoic on Doppler. This may be a complex fluid collection. IMPRESSION: 2.2 x 2.6 cm hypoechoic structure in the muscles of the left upper arm most likely a complex fluid collection. This may represent abscess particularly based on the CT scan were rim enhancement was noted. Electronically Signed   By: Marlan Palau M.D.   On: 10/06/2017  20:22   Ct Extrem Up Entire Arm L W/cm  Result Date: 10/05/2017 CLINICAL DATA:  Acute onset of left arm pain. Evaluate for recurrent left arm abscess. EXAM: CT OF THE UPPER LEFT EXTREMITY WITH CONTRAST TECHNIQUE: Multidetector CT imaging of the upper left extremity was performed according to the standard protocol following intravenous contrast administration. COMPARISON:  Left shoulder CT performed 12/24/2015 CONTRAST:  100mL ISOVUE-300 IOPAMIDOL (ISOVUE-300) INJECTION 61% FINDINGS: Bones/Joint/Cartilage There is no evidence of fracture or dislocation. No osseous erosions are seen to suggest osteomyelitis. No elbow joint effusion is identified. The left humeral head remains seated at the glenoid fossa. The cartilage is not well assessed on CT. Ligaments Suboptimally assessed by CT. Muscles and Tendons An abscess is noted extending into the musculature of the left lateral upper arm, measuring approximately 4.9 x 2.9 x 1.9 cm, multiloculated in appearance. This is partially subcutaneous  in nature. The remaining musculature is unremarkable appearance. No definite tendon abnormalities are characterized. Soft tissues The visualized vasculature is grossly unremarkable. Soft tissue swelling and inflammation are noted along the left lateral upper arm. Visualized left axillary nodes are normal in size. IMPRESSION: Abscess extending into the musculature of the left lateral upper arm, measuring 4.9 x 2.9 x 1.9 cm, with mild loculations. This is partially subcutaneous in nature. Surrounding soft tissue swelling and inflammation. Electronically Signed   By: Roanna RaiderJeffery  Chang M.D.   On: 10/05/2017 22:58    Microbiology: Recent Results (from the past 240 hour(s))  Culture, blood (x 2)     Status: None (Preliminary result)   Collection Time: 10/06/17  1:58 AM  Result Value Ref Range Status   Specimen Description BLOOD RIGHT ARM  Final   Special Requests   Final    BOTTLES DRAWN AEROBIC AND ANAEROBIC Blood Culture adequate volume   Culture   Final    NO GROWTH 1 DAY Performed at [facility], [address] [place], [place] [address]    Report Status PENDING  Incomplete  Culture, blood (x 2)     Status: None (Preliminary result)   Collection Time: 10/06/17  2:12 AM  Result Value Ref Range Status   Specimen Description BLOOD RIGHT ARM  Final   Special Requests   Final    BOTTLES DRAWN AEROBIC AND ANAEROBIC Blood Culture adequate volume   Culture   Final    NO GROWTH 1 DAY Performed at [facility], [address] [place], [place] [address]    Report Status PENDING  Incomplete  Aerobic Culture (superficial specimen)     Status: None (Preliminary result)   Collection Time: 10/06/17  3:31 PM  Result Value Ref Range Status   Specimen Description ARM LEFT  Final   Special Requests NONE  Final   Gram Stain   Final    ABUNDANT WBC PRESENT,BOTH PMN AND MONONUCLEAR NO ORGANISMS SEEN    Culture   Final    NO GROWTH 2 DAYS Performed at [facility], [address] [place], [place] [address]    Report Status PENDING  Incomplete     Labs: Basic Metabolic Panel: Recent Labs  Lab 10/05/17 1728 10/05/17 2120 10/06/17 0352  NA 134* 133* 136  K 5.3* 5.0 4.0  CL 101 101 106  CO2  --  21* 21*  GLUCOSE 262* 216* 204*  BUN 12 11 10   CREATININE 1.40* 1.40* 1.23  CALCIUM  --  8.4* 7.5*   Liver Function Tests: Recent Labs  Lab 10/06/17 0152  AST 35  ALT 19  ALKPHOS 92  BILITOT 0.5  PROT 6.5  ALBUMIN 2.7*   CBC: Recent Labs  Lab 10/05/17 1728 10/05/17 2120 10/06/17 0352  WBC  --  5.9 4.5  NEUTROABS  --  3.8  --   HGB 11.9* 10.8* 8.8*  HCT 35.0* 33.1* 27.4*  MCV  --  78.3 77.6*  PLT  --  157 157    CBG: Recent Labs  Lab 10/07/17 1745 10/07/17 1809 10/07/17 2117 10/08/17 0813 10/08/17 1317  GLUCAP 37* 77 289* 238* 297*    Principal Problem:   Abscess of arm, left Active Problems:   Type II diabetes mellitus with renal manifestations (HCC)   Heroin abuse (HCC)   Alcohol abuse   Tobacco abuse   Polysubstance abuse (HCC)   Severe malnutrition (HCC)   Time coordinating discharge: 35 minutes  Signed:  Brendia Sacks, MD [facility] 10/08/2017, 2:02 PM

## 2017-10-11 LAB — CULTURE, BLOOD (ROUTINE X 2)
CULTURE: NO GROWTH
Culture: NO GROWTH
SPECIAL REQUESTS: ADEQUATE
Special Requests: ADEQUATE

## 2017-10-12 ENCOUNTER — Ambulatory Visit: Payer: Medicare Other | Admitting: Dietician

## 2017-11-03 ENCOUNTER — Ambulatory Visit (INDEPENDENT_AMBULATORY_CARE_PROVIDER_SITE_OTHER): Payer: Medicare Other | Admitting: Endocrinology

## 2017-11-03 ENCOUNTER — Other Ambulatory Visit: Payer: Medicare Other

## 2017-11-03 ENCOUNTER — Encounter: Payer: Self-pay | Admitting: Endocrinology

## 2017-11-03 ENCOUNTER — Other Ambulatory Visit: Payer: Self-pay

## 2017-11-03 ENCOUNTER — Encounter

## 2017-11-03 VITALS — BP 132/86 | HR 84 | Ht 67.0 in | Wt 103.8 lb

## 2017-11-03 DIAGNOSIS — E1165 Type 2 diabetes mellitus with hyperglycemia: Secondary | ICD-10-CM

## 2017-11-03 DIAGNOSIS — Z794 Long term (current) use of insulin: Secondary | ICD-10-CM | POA: Diagnosis not present

## 2017-11-03 LAB — GLUCOSE, POCT (MANUAL RESULT ENTRY): POC GLUCOSE: 106 mg/dL — AB (ref 70–99)

## 2017-11-03 MED ORDER — LANCETS MISC: 1.0000 | Freq: Every day | 3 refills | Status: AC

## 2017-11-03 NOTE — Patient Instructions (Addendum)
Lantus 12 units at 9am and 9pm  Meals cover with 6 units Novolog  WITH THIS NOVOLOG ADD  2 units for 200 sugar level, and if 300 take 4 more  Check sugar before each meal and bedtime

## 2017-11-03 NOTE — Progress Notes (Signed)
Patient ID: Raymond Winters, male   DOB: Oct 22, 1960, 57 y.o.   MRN: [id number]          Reason for Appointment: follow-up for Type 2 Diabetes  History of Present Illness:          Date of diagnosis of type 2 diabetes mellitus:  1994      Background history:   He thinks he was tried on metformin at the time of diagnosis.  He does not think his weight was excessive at the time of diagnosis He was probably started on insulin within a few years of his diagnosis because of poor control His blood sugar control has always been poor with A1c History indicating high levels except for twice when his A1c was below 8% He has been on various insulin regimens in the past Earlier this year when he was incarcerated he was taking 70/30 insulin and with this he was getting periodic hypoglycemia After his ER visit he was changed to Lantus and NovoLog  Recent history:   INSULIN regimen is:  15 Lantus bid, NovoLog 10-12 units as needed  His A1c has been consistently over 13 including done in 08/2017   Current management, blood sugar patterns and problems identified:  He has not been seen in follow-up since 12/18  He says he did not have a meter available and sporadic blood sugars in the last couple of weeks, appears to be checking blood sugars mostly in the evenings at various times and all of his 4 readings are close to 500 or more  He thinks he is taking his NovoLog when he has high readings but most likely is not taking this since he has very sporadic blood sugar readings  On his own he has taken a higher dose of Lantus, 15 units instead of 10 which he thinks is for the last month  Surprisingly he has not had any low blood sugars despite increasing his insulin, previously has had episodes of severe hypoglycemia also  He says he is getting 2 meals a day but today he has not had any food  Surprisingly his blood sugar is only 102 in the office today with no insulin this morning although it was 578 last  evening before eating  He has been referred to see the dietitian and nurse educator but they have been unable to contact him for appointment        Side effects from medications have been: None  Compliance with the medical regimen: Inconsistent Hypoglycemia: None recently  Glucose monitoring:  As above   Self-care: The diet that the patient has been following is: tries to limit sweets and sweet drinks .       Typical meal intake: Breakfast is late morning, eggs/toast or cereal; dinner: fried chicken sometimes,  snacks at times              Dietician visit, most recent: never               Exercise:  minimal because of fatigue  Weight history:  Wt Readings from Last 3 Encounters:  11/03/17 103 lb 12.8 oz (47.1 kg)  10/07/17 115 lb 1.3 oz (52.2 kg)  09/14/17 102 lb 6.4 oz (46.4 kg)    Glycemic control:   Lab Results  Component Value Date   HGBA1C 13.6 09/14/2017   HGBA1C 13.2 05/26/2017   HGBA1C 13.1 05/06/2017   Lab Results  Component Value Date   MICROALBUR <0.7 05/06/2017   LDLCALC 44 07/02/2017  CREATININE 1.23 10/06/2017   Lab Results  Component Value Date   MICRALBCREAT 1.3 05/06/2017    Lab Results  Component Value Date   FRUCTOSAMINE 413 (H) 07/02/2017   FRUCTOSAMINE 391 (H) 06/04/2017      Allergies as of 11/03/2017      Reactions   Ativan [lorazepam] Other (See Comments)   HALLUCINATIONS      Medication List        Accurate as of 11/03/17  4:52 PM. Always use your most recent med list.          aspirin EC 81 MG tablet Take 1 tablet (81 mg total) by mouth daily.   atorvastatin 40 MG tablet Commonly known as:  LIPITOR Take 1 tablet (40 mg total) by mouth daily.   ferrous sulfate 325 (65 FE) MG tablet Take 1 tablet (325 mg total) by mouth 2 (two) times daily with a meal.   gabapentin 300 MG capsule Commonly known as:  NEURONTIN TAKE 2 CAPSULE BY MOUTH AT BEDTIME FOR DIABETIC NERVE PAIN   Insulin Glargine 100 UNIT/ML Solostar  Pen Commonly known as:  LANTUS SOLOSTAR Inject 10 Units into the skin 2 (two) times daily.   Lancets Misc 1 each by Does not apply route daily. Use as instructed to check blood sugar   mirtazapine 30 MG tablet Commonly known as:  REMERON Take 1 tablet (30 mg total) by mouth at bedtime.   NOVOLOG FLEXPEN 100 UNIT/ML FlexPen Generic drug:  insulin aspart Inject 0-12 Units into the skin See admin instructions. Inject 0-12 units subcutaneously before meals as per sliding scale as directed   Pancrelipase (Lip-Prot-Amyl) 25000 units Cpep Commonly known as:  ZENPEP Take 25,000 Units by mouth 3 (three) times daily before meals.   polyethylene glycol packet Commonly known as:  MIRALAX / GLYCOLAX Take 17 g by mouth daily.       Allergies:  Allergies  Allergen Reactions  . Ativan [Lorazepam] Other (See Comments)    HALLUCINATIONS    Past Medical History:  Diagnosis Date  . Alcohol dependence (HCC) 01/05/2016  . Alcoholic liver disease, unspecified (HCC) 01/05/2016  . Arthritis    "lower extremeties, hands, shoulders, back" (08/20/2016)  . Chronic alcoholic pancreatitis (HCC) 01/05/2016  . CKD (chronic kidney disease), stage III (HCC)   . Depression   . Family history of adverse reaction to anesthesia    pt's sister has hx. of post-op N/V  . Hepatitis C    "never treated" (08/20/2016)  . Heroin abuse (HCC)   . High cholesterol   . Hypoalbuminemia due to protein-calorie malnutrition (HCC) 01/05/2016  . Insulin dependent diabetes mellitus (HCC)   . Lactic acid increased, CHRONIC 01/05/2016   Secondary to Liver Disease, Alcoholism, and Diabetes  . 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 Shoulder wound 01/2016   left    Past Surgical History:  Procedure Laterality Date  . APPLICATION OF WOUND VAC Left 12/24/2015   Procedure: APPLICATION OF WOUND VAC; LEFT SHOULDER;  Surgeon: Tarry Kos, MD;  Location: [facility] OR;  Service: Orthopedics;  Laterality: Left;  .  APPLICATION OF WOUND VAC Left 01/29/2016   Procedure: LEFT SHOULDER SPLIT THICKNESS SKIN GRAFT, WOUND VAC;  Surgeon: Tarry Kos, MD;  Location: [facility];  Service: Orthopedics;  Laterality: Left;  . I&D EXTREMITY Right 03/20/2013   Procedure: IRRIGATION AND DEBRIDEMENT EXTREMITY;  Surgeon: Marlowe Shores, MD;  Location: [facility] OR;  Service: Orthopedics;  Laterality: Right;  .  I&D EXTREMITY Left 12/24/2015   Procedure:  DEBRIDEMENT OF MUSCLE, SUBCUTANEOUS TISSUE LEFT SHOULDER;  Surgeon: Tarry Kos, MD;  Location: [facility] OR;  Service: Orthopedics;  Laterality: Left;  . I&D EXTREMITY Right 12/18/2016   Procedure: INCISION AND DRAINAGE ANKLE ABSCESS;  Surgeon: Sheral Apley, MD;  [facility] OR;  Service: Orthopedics;  Laterality: Right;  . INCISION AND DRAINAGE ABSCESS Right 08/19/2016   Procedure: DEBRIDEMENT RIGHT BUTTOCK  ABSCESS;  Surgeon: Manus Rudd, MD;  [facility] OR;  Service: General;  Laterality: Right;  . IRRIGATION AND DEBRIDEMENT ABSCESS Right 08/19/2016   buttocks  . IRRIGATION AND DEBRIDEMENT SHOULDER Left 12/27/2015   Procedure: IRRIGATION AND DEBRIDEMENT LEFT SHOULDER;  Surgeon: Tarry Kos, MD;  Location: [facility] OR;  Service: Orthopedics;  Laterality: Left;  . IRRIGATION AND DEBRIDEMENT SHOULDER Left 12/30/2015   Procedure: IRRIGATION AND DEBRIDEMENT LEFT SHOULDER;  Surgeon: Tarry Kos, MD;  Location: [facility] OR;  Service: Orthopedics;  Laterality: Left;  . SKIN SPLIT GRAFT Left 01/29/2016   Procedure: SKIN GRAFT SPLIT THICKNESS,;  Surgeon: Tarry Kos, MD;  Location: [facility];  Service: Orthopedics;  Laterality: Left;    Family History  Problem Relation Age of Onset  . Diabetes Mother   . Heart disease Mother   . Hypertension Mother   . Stroke Mother   . Vision loss Mother   . Heart disease Father   . Hypertension Father   . Anesthesia problems Sister        post-op N/V  . Diabetes Sister     Social History:  reports that he has been  smoking cigarettes.  He has a 18.50 pack-year smoking history. He has never used smokeless tobacco. He reports that he has current or past drug history. Drugs: IV and Heroin. He reports that he does not drink alcohol.   Review of Systems   Lipid history: No history of hyperlipidemia    Lab Results  Component Value Date   CHOL 90 07/02/2017   HDL 30.10 (L) 07/02/2017   LDLCALC 44 07/02/2017   TRIG 80.0 07/02/2017   CHOLHDL 3 07/02/2017             Most recent eye exam was ?  5 years ago  Most recent foot exam: 11/18   Physical Examination:  BP 132/86 (BP Location: Left Arm, Patient Position: Sitting, Cuff Size: Normal)   Pulse 84   Ht  (1.702 m)   Wt 103 lb 12.8 oz (47.1 kg)   SpO2 97%   BMI 16.26 kg/m    ASSESSMENT:  Diabetes type 2, nonobese and persistently uncontrolled  He is insulin-dependent, likely has nsulin deficiency related to chronic pancreatitis  See history of present illness for detailed discussion of current diabetes management, blood sugar patterns and problems identified   His last 3 A1c readings have been over 13  Recently with no checking his blood sugars much difficult to know what his blood sugar patterns are Also difficult to guide him regarding his insulin regimen without adequate monitoring and persistently high A1c and unpredictable episodes of hypoglycemia in the past  Also he has variable eating habits and does need for diabetes education including meal planning    PLAN:     Start checking blood sugars consistently at least twice a day at different times including after meals  He will need to take NovoLog before each meal regardless of whether he checks his sugar to cover the meal  Discussed taking correction  doses of 2 units NovoLog for every 100 mg reading above 100  Since blood sugar is normal today even without taking Lantus this morning he will back down his Lantus to 12 instead of 15  More regular follow-up     Patient Instructions  Lantus 12 units at 9am and 9pm  Meals cover with 6 units Novolog  WITH THIS NOVOLOG ADD  2 units for 200 sugar level, and if 300 take 4 more  Check sugar before each meal and bedtime     Reather Littler 11/03/2017, 4:52 PM   Note: This office note was prepared with Dragon voice recognition system technology. Any transcriptional errors that result from this process are unintentional.

## 2017-11-10 ENCOUNTER — Encounter (HOSPITAL_COMMUNITY): Payer: Self-pay | Admitting: Emergency Medicine

## 2017-11-10 ENCOUNTER — Other Ambulatory Visit: Payer: Self-pay

## 2017-11-10 ENCOUNTER — Inpatient Hospital Stay (HOSPITAL_COMMUNITY)
Admission: EM | Admit: 2017-11-10 | Discharge: 2017-11-12 | DRG: 637 | Disposition: A | Discharge status: Home / Self Care | Payer: Medicare Other | Attending: Internal Medicine | Admitting: Internal Medicine

## 2017-11-10 ENCOUNTER — Emergency Department (HOSPITAL_COMMUNITY): Payer: Medicare Other

## 2017-11-10 DIAGNOSIS — K709 Alcoholic liver disease, unspecified: Secondary | ICD-10-CM | POA: Diagnosis present

## 2017-11-10 DIAGNOSIS — Z821 Family history of blindness and visual loss: Secondary | ICD-10-CM

## 2017-11-10 DIAGNOSIS — E785 Hyperlipidemia, unspecified: Secondary | ICD-10-CM | POA: Diagnosis present

## 2017-11-10 DIAGNOSIS — N183 Chronic kidney disease, stage 3 unspecified: Secondary | ICD-10-CM | POA: Diagnosis present

## 2017-11-10 DIAGNOSIS — E43 Unspecified severe protein-calorie malnutrition: Secondary | ICD-10-CM | POA: Diagnosis present

## 2017-11-10 DIAGNOSIS — S51801A Unspecified open wound of right forearm, initial encounter: Secondary | ICD-10-CM | POA: Diagnosis not present

## 2017-11-10 DIAGNOSIS — E860 Dehydration: Secondary | ICD-10-CM | POA: Diagnosis present

## 2017-11-10 DIAGNOSIS — Z720 Tobacco use: Secondary | ICD-10-CM | POA: Diagnosis present

## 2017-11-10 DIAGNOSIS — F329 Major depressive disorder, single episode, unspecified: Secondary | ICD-10-CM | POA: Diagnosis present

## 2017-11-10 DIAGNOSIS — E1101 Type 2 diabetes mellitus with hyperosmolarity with coma: Principal | ICD-10-CM | POA: Diagnosis present

## 2017-11-10 DIAGNOSIS — K860 Alcohol-induced chronic pancreatitis: Secondary | ICD-10-CM | POA: Diagnosis present

## 2017-11-10 DIAGNOSIS — Z79899 Other long term (current) drug therapy: Secondary | ICD-10-CM

## 2017-11-10 DIAGNOSIS — E871 Hypo-osmolality and hyponatremia: Secondary | ICD-10-CM | POA: Diagnosis present

## 2017-11-10 DIAGNOSIS — Z681 Body mass index (BMI) 19 or less, adult: Secondary | ICD-10-CM | POA: Diagnosis not present

## 2017-11-10 DIAGNOSIS — E1165 Type 2 diabetes mellitus with hyperglycemia: Secondary | ICD-10-CM | POA: Diagnosis not present

## 2017-11-10 DIAGNOSIS — L02413 Cutaneous abscess of right upper limb: Secondary | ICD-10-CM | POA: Diagnosis present

## 2017-11-10 DIAGNOSIS — E1100 Type 2 diabetes mellitus with hyperosmolarity without nonketotic hyperglycemic-hyperosmolar coma (NKHHC): Secondary | ICD-10-CM | POA: Diagnosis not present

## 2017-11-10 DIAGNOSIS — L0291 Cutaneous abscess, unspecified: Secondary | ICD-10-CM | POA: Diagnosis present

## 2017-11-10 DIAGNOSIS — E872 Acidosis: Secondary | ICD-10-CM | POA: Diagnosis present

## 2017-11-10 DIAGNOSIS — E7800 Pure hypercholesterolemia, unspecified: Secondary | ICD-10-CM | POA: Diagnosis present

## 2017-11-10 DIAGNOSIS — F1110 Opioid abuse, uncomplicated: Secondary | ICD-10-CM | POA: Diagnosis present

## 2017-11-10 DIAGNOSIS — Z794 Long term (current) use of insulin: Secondary | ICD-10-CM | POA: Diagnosis not present

## 2017-11-10 DIAGNOSIS — Z8249 Family history of ischemic heart disease and other diseases of the circulatory system: Secondary | ICD-10-CM | POA: Diagnosis not present

## 2017-11-10 DIAGNOSIS — F17210 Nicotine dependence, cigarettes, uncomplicated: Secondary | ICD-10-CM | POA: Diagnosis present

## 2017-11-10 DIAGNOSIS — Z716 Tobacco abuse counseling: Secondary | ICD-10-CM

## 2017-11-10 DIAGNOSIS — E1122 Type 2 diabetes mellitus with diabetic chronic kidney disease: Secondary | ICD-10-CM | POA: Diagnosis present

## 2017-11-10 DIAGNOSIS — N179 Acute kidney failure, unspecified: Secondary | ICD-10-CM | POA: Diagnosis not present

## 2017-11-10 DIAGNOSIS — E1121 Type 2 diabetes mellitus with diabetic nephropathy: Secondary | ICD-10-CM

## 2017-11-10 DIAGNOSIS — B182 Chronic viral hepatitis C: Secondary | ICD-10-CM | POA: Diagnosis present

## 2017-11-10 DIAGNOSIS — F1010 Alcohol abuse, uncomplicated: Secondary | ICD-10-CM | POA: Diagnosis present

## 2017-11-10 DIAGNOSIS — E1129 Type 2 diabetes mellitus with other diabetic kidney complication: Secondary | ICD-10-CM | POA: Diagnosis present

## 2017-11-10 DIAGNOSIS — Z823 Family history of stroke: Secondary | ICD-10-CM

## 2017-11-10 DIAGNOSIS — Z888 Allergy status to other drugs, medicaments and biological substances status: Secondary | ICD-10-CM

## 2017-11-10 DIAGNOSIS — Z833 Family history of diabetes mellitus: Secondary | ICD-10-CM

## 2017-11-10 DIAGNOSIS — R739 Hyperglycemia, unspecified: Secondary | ICD-10-CM

## 2017-11-10 DIAGNOSIS — Z7982 Long term (current) use of aspirin: Secondary | ICD-10-CM

## 2017-11-10 LAB — CBG MONITORING, ED: Glucose-Capillary: 320 mg/dL — ABNORMAL HIGH (ref 65–99)

## 2017-11-10 LAB — COMPREHENSIVE METABOLIC PANEL
ALBUMIN: 2.9 g/dL — AB (ref 3.5–5.0)
ALK PHOS: 131 U/L — AB (ref 38–126)
ALT: 22 U/L (ref 17–63)
AST: 51 U/L — AB (ref 15–41)
Anion gap: 13 (ref 5–15)
BILIRUBIN TOTAL: 0.5 mg/dL (ref 0.3–1.2)
BUN: 20 mg/dL (ref 6–20)
CALCIUM: 9.1 mg/dL (ref 8.9–10.3)
CO2: 20 mmol/L — ABNORMAL LOW (ref 22–32)
CREATININE: 2 mg/dL — AB (ref 0.61–1.24)
Chloride: 96 mmol/L — ABNORMAL LOW (ref 101–111)
GFR calc Af Amer: 41 mL/min — ABNORMAL LOW (ref >60)
GFR calc non Af Amer: 35 mL/min — ABNORMAL LOW (ref >60)
GLUCOSE: 748 mg/dL — AB (ref 65–99)
POTASSIUM: 5 mmol/L (ref 3.5–5.1)
Sodium: 129 mmol/L — ABNORMAL LOW (ref 135–145)
TOTAL PROTEIN: 8.3 g/dL — AB (ref 6.5–8.1)

## 2017-11-10 LAB — CBC WITH DIFFERENTIAL/PLATELET
Abs Immature Granulocytes: 0 10*3/uL (ref 0.0–0.1)
BASOS PCT: 1 %
Basophils Absolute: 0 10*3/uL (ref 0.0–0.1)
EOS ABS: 0 10*3/uL (ref 0.0–0.7)
Eosinophils Relative: 0 %
HCT: 34.7 % — ABNORMAL LOW (ref 39.0–52.0)
Hemoglobin: 11 g/dL — ABNORMAL LOW (ref 13.0–17.0)
IMMATURE GRANULOCYTES: 0 %
Lymphocytes Relative: 38 %
Lymphs Abs: 2 10*3/uL (ref 0.7–4.0)
MCH: 25.2 pg — ABNORMAL LOW (ref 26.0–34.0)
MCHC: 31.7 g/dL (ref 30.0–36.0)
MCV: 79.4 fL (ref 78.0–100.0)
Monocytes Absolute: 0.3 10*3/uL (ref 0.1–1.0)
Monocytes Relative: 5 %
Neutro Abs: 3 10*3/uL (ref 1.7–7.7)
Neutrophils Relative %: 56 %
PLATELETS: 266 10*3/uL (ref 150–400)
RBC: 4.37 MIL/uL (ref 4.22–5.81)
RDW: 13.3 % (ref 11.5–15.5)
WBC: 5.3 10*3/uL (ref 4.0–10.5)

## 2017-11-10 LAB — I-STAT CG4 LACTIC ACID, ED
LACTIC ACID, VENOUS: 3.93 mmol/L — AB (ref 0.5–1.9)
Lactic Acid, Venous: 5.25 mmol/L (ref 0.5–1.9)

## 2017-11-10 MED ORDER — PIPERACILLIN-TAZOBACTAM 3.375 G IVPB
3.3750 g | Freq: Three times a day (TID) | INTRAVENOUS | Status: DC
  Administered 2017-11-11 – 2017-11-12 (×5): 3.375 g via INTRAVENOUS
  Filled 2017-11-10 (×6): qty 50

## 2017-11-10 MED ORDER — INSULIN GLARGINE 100 UNIT/ML ~~LOC~~ SOLN
10.0000 [IU] | Freq: Two times a day (BID) | SUBCUTANEOUS | Status: DC
  Administered 2017-11-11 – 2017-11-12 (×4): 10 [IU] via SUBCUTANEOUS
  Filled 2017-11-10 (×5): qty 0.1

## 2017-11-10 MED ORDER — GABAPENTIN 300 MG PO CAPS
600.0000 mg | ORAL_CAPSULE | Freq: Every day | ORAL | Status: DC
  Administered 2017-11-11 (×2): 600 mg via ORAL
  Filled 2017-11-10 (×2): qty 2

## 2017-11-10 MED ORDER — VANCOMYCIN HCL IN DEXTROSE 1-5 GM/200ML-% IV SOLN
1000.0000 mg | Freq: Once | INTRAVENOUS | Status: AC
  Administered 2017-11-10: 1000 mg via INTRAVENOUS
  Filled 2017-11-10: qty 200

## 2017-11-10 MED ORDER — ONDANSETRON HCL 4 MG PO TABS: 4.0000 mg | ORAL_TABLET | Freq: Four times a day (QID) | ORAL | Status: DC | PRN

## 2017-11-10 MED ORDER — ADULT MULTIVITAMIN W/MINERALS CH
1.0000 | ORAL_TABLET | Freq: Every day | ORAL | Status: DC
  Administered 2017-11-11 – 2017-11-12 (×3): 1 via ORAL
  Filled 2017-11-10 (×3): qty 1

## 2017-11-10 MED ORDER — ONDANSETRON HCL 4 MG/2ML IJ SOLN: 4.0000 mg | Freq: Four times a day (QID) | INTRAMUSCULAR | Status: DC | PRN

## 2017-11-10 MED ORDER — INSULIN ASPART 100 UNIT/ML ~~LOC~~ SOLN
0.0000 [IU] | Freq: Three times a day (TID) | SUBCUTANEOUS | Status: DC
  Administered 2017-11-11 (×2): 2 [IU] via SUBCUTANEOUS
  Administered 2017-11-12: 3 [IU] via SUBCUTANEOUS

## 2017-11-10 MED ORDER — MIRTAZAPINE 30 MG PO TABS
30.0000 mg | ORAL_TABLET | Freq: Every day | ORAL | Status: DC
  Administered 2017-11-11 (×2): 30 mg via ORAL
  Filled 2017-11-10 (×2): qty 2
  Filled 2017-11-10 (×2): qty 1

## 2017-11-10 MED ORDER — HYDROCODONE-ACETAMINOPHEN 5-325 MG PO TABS
1.0000 | ORAL_TABLET | Freq: Once | ORAL | Status: AC
  Administered 2017-11-10: 1 via ORAL
  Filled 2017-11-10: qty 1

## 2017-11-10 MED ORDER — ATORVASTATIN CALCIUM 40 MG PO TABS
40.0000 mg | ORAL_TABLET | Freq: Every day | ORAL | Status: DC
  Administered 2017-11-11 – 2017-11-12 (×3): 40 mg via ORAL
  Filled 2017-11-10 (×3): qty 1

## 2017-11-10 MED ORDER — INSULIN ASPART 100 UNIT/ML ~~LOC~~ SOLN
4.0000 [IU] | Freq: Three times a day (TID) | SUBCUTANEOUS | Status: DC
  Administered 2017-11-11 (×2): 4 [IU] via SUBCUTANEOUS

## 2017-11-10 MED ORDER — PIPERACILLIN-TAZOBACTAM 3.375 G IVPB 30 MIN
3.3750 g | Freq: Once | INTRAVENOUS | Status: AC
  Administered 2017-11-10: 3.375 g via INTRAVENOUS
  Filled 2017-11-10: qty 50

## 2017-11-10 MED ORDER — ACETAMINOPHEN 325 MG PO TABS
650.0000 mg | ORAL_TABLET | Freq: Four times a day (QID) | ORAL | Status: DC
  Administered 2017-11-10: 650 mg via ORAL
  Filled 2017-11-10: qty 2

## 2017-11-10 MED ORDER — ZENPEP 25000 UNITS PO CPEP: 25000.0000 [IU] | ORAL_CAPSULE | Freq: Three times a day (TID) | ORAL | Status: DC

## 2017-11-10 MED ORDER — OXYCODONE HCL 5 MG PO TABS
10.0000 mg | ORAL_TABLET | ORAL | Status: DC | PRN
  Administered 2017-11-11 – 2017-11-12 (×4): 10 mg via ORAL
  Filled 2017-11-10 (×4): qty 2

## 2017-11-10 MED ORDER — VANCOMYCIN HCL 500 MG IV SOLR
500.0000 mg | INTRAVENOUS | Status: DC
  Administered 2017-11-11: 500 mg via INTRAVENOUS
  Filled 2017-11-10 (×2): qty 500

## 2017-11-10 MED ORDER — TRAZODONE HCL 50 MG PO TABS
50.0000 mg | ORAL_TABLET | Freq: Every evening | ORAL | Status: DC | PRN
  Administered 2017-11-11 (×2): 50 mg via ORAL
  Filled 2017-11-10 (×2): qty 1

## 2017-11-10 MED ORDER — SODIUM CHLORIDE 0.9 % IV BOLUS
1000.0000 mL | Freq: Once | INTRAVENOUS | Status: AC
  Administered 2017-11-10: 1000 mL via INTRAVENOUS

## 2017-11-10 MED ORDER — OXYCODONE HCL 5 MG PO TABS
20.0000 mg | ORAL_TABLET | ORAL | Status: DC | PRN
  Administered 2017-11-11 (×3): 20 mg via ORAL
  Filled 2017-11-10 (×3): qty 4

## 2017-11-10 MED ORDER — INSULIN ASPART 100 UNIT/ML ~~LOC~~ SOLN: 0.0000 [IU] | SUBCUTANEOUS | Status: DC

## 2017-11-10 MED ORDER — FERROUS SULFATE 325 (65 FE) MG PO TABS
325.0000 mg | ORAL_TABLET | Freq: Two times a day (BID) | ORAL | Status: DC
  Administered 2017-11-11 – 2017-11-12 (×3): 325 mg via ORAL
  Filled 2017-11-10 (×3): qty 1

## 2017-11-10 MED ORDER — POTASSIUM CHLORIDE IN NACL 20-0.9 MEQ/L-% IV SOLN
INTRAVENOUS | Status: AC
  Filled 2017-11-10: qty 1000

## 2017-11-10 MED ORDER — INSULIN ASPART 100 UNIT/ML ~~LOC~~ SOLN
10.0000 [IU] | Freq: Once | SUBCUTANEOUS | Status: AC
  Administered 2017-11-10: 10 [IU] via SUBCUTANEOUS
  Filled 2017-11-10: qty 1

## 2017-11-10 MED ORDER — SODIUM CHLORIDE 0.9 % IV SOLN
INTRAVENOUS | Status: DC
  Administered 2017-11-10 – 2017-11-11 (×2): via INTRAVENOUS

## 2017-11-10 MED ORDER — ENOXAPARIN SODIUM 30 MG/0.3ML ~~LOC~~ SOLN
30.0000 mg | Freq: Every day | SUBCUTANEOUS | Status: DC
  Administered 2017-11-11 – 2017-11-12 (×2): 30 mg via SUBCUTANEOUS
  Filled 2017-11-10 (×2): qty 0.3

## 2017-11-10 MED ORDER — SODIUM CHLORIDE 0.9% FLUSH: 3.0000 mL | INTRAVENOUS | Status: DC | PRN

## 2017-11-10 MED ORDER — SODIUM CHLORIDE 0.9 % IV SOLN: 250.0000 mL | INTRAVENOUS | Status: DC | PRN

## 2017-11-10 MED ORDER — INSULIN ASPART 100 UNIT/ML ~~LOC~~ SOLN
0.0000 [IU] | Freq: Every day | SUBCUTANEOUS | Status: DC
  Administered 2017-11-10: 4 [IU] via SUBCUTANEOUS
  Filled 2017-11-10: qty 1

## 2017-11-10 MED ORDER — ASPIRIN EC 81 MG PO TBEC
81.0000 mg | DELAYED_RELEASE_TABLET | Freq: Every day | ORAL | Status: DC
  Administered 2017-11-11 – 2017-11-12 (×3): 81 mg via ORAL
  Filled 2017-11-10 (×3): qty 1

## 2017-11-10 MED ORDER — ALBUTEROL SULFATE (2.5 MG/3ML) 0.083% IN NEBU: 2.5000 mg | INHALATION_SOLUTION | RESPIRATORY_TRACT | Status: DC | PRN

## 2017-11-10 MED ORDER — SODIUM CHLORIDE 0.9% FLUSH
3.0000 mL | Freq: Two times a day (BID) | INTRAVENOUS | Status: DC
  Administered 2017-11-11 (×2): 3 mL via INTRAVENOUS

## 2017-11-10 MED ORDER — KETOROLAC TROMETHAMINE 15 MG/ML IJ SOLN
15.0000 mg | Freq: Four times a day (QID) | INTRAMUSCULAR | Status: DC | PRN
Start: 2017-11-10 — End: 2017-11-12

## 2017-11-10 NOTE — H&P (Signed)
History and Physical    Raymond Winters ZOX:[id number] DOB: [birth year] DOA: 11/10/2017  PCP: Hoy Register, MD  Patient coming from: Home.  I have personally briefly reviewed patient's old medical records in [facility]  Chief Complaint: Sected injection site with abscess  HPI: Raymond Winters is a 57 y.o. male with medical history significant of heroin abuse with recent injection into the right arm with resultant abscess, alcoholic liver disease, chronic alcoholic pancreatitis chronic, chronic kidney disease, depression, hepatitis C, hyperlipidemia who presents the emergency department with a right forearm wound that has been present for the past week.  He began to have an abscess in that area after he injected it with heroin.  He denies having any foreign body or needle breaking off in the arm.  It slowly be became red and swollen and an abscess grew and then last night it ruptured.  The pain is sharp and severe and nonradiating.  He last used heroin 3 or 4 days ago.  He denies fever, chills, nausea, vomiting, hematemesis, Maddock easier, dysuria, urinary frequency, numbness or other wounds. In the emergency department the patient was evaluated and a BMP was obtained which showed a glucose of other than 700.  Interestingly he saw Dr. Lucianne Muss from endocrinology on May 15 and was told to cut back on his Lantus dose because his morning glucoses were normal on an empty stomach.  Requested that he decrease his Lantus from 18 units in the morning to LV units in the morning.  Patient was given IV fluid times several liters in the emergency department help bring down his sugars.  Review of Systems: As per HPI otherwise all other systems reviewed and  negative.    Past Medical History:  Diagnosis Date  . Alcohol dependence (HCC) 01/05/2016  . Alcoholic liver disease, unspecified (HCC) 01/05/2016  . Arthritis    "lower extremeties, hands, shoulders, back" (08/20/2016)  . Chronic alcoholic pancreatitis (HCC)  01/05/2016  . CKD (chronic kidney disease), stage III (HCC)   . Depression   . Family history of adverse reaction to anesthesia    pt's sister has hx. of post-op N/V  . Hepatitis C    "never treated" (08/20/2016)  . Heroin abuse (HCC)   . High cholesterol   . Hypoalbuminemia due to protein-calorie malnutrition (HCC) 01/05/2016  . Insulin dependent diabetes mellitus (HCC)   . Lactic acid increased, CHRONIC 01/05/2016   Secondary to Liver Disease, Alcoholism, and Diabetes  . 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 Shoulder wound 01/2016   left    Past Surgical History:  Procedure Laterality Date  . APPLICATION OF WOUND VAC Left 12/24/2015   Procedure: APPLICATION OF WOUND VAC; LEFT SHOULDER;  Surgeon: Tarry Kos, MD;  Location: [facility] OR;  Service: Orthopedics;  Laterality: Left;  . APPLICATION OF WOUND VAC Left 01/29/2016   Procedure: LEFT SHOULDER SPLIT THICKNESS SKIN GRAFT, WOUND VAC;  Surgeon: Tarry Kos, MD;  Location: [facility];  Service: Orthopedics;  Laterality: Left;  . I&D EXTREMITY Right 03/20/2013   Procedure: IRRIGATION AND DEBRIDEMENT EXTREMITY;  Surgeon: Marlowe Shores, MD;  Location: [facility] OR;  Service: Orthopedics;  Laterality: Right;  . I&D EXTREMITY Left 12/24/2015   Procedure:  DEBRIDEMENT OF MUSCLE, SUBCUTANEOUS TISSUE LEFT SHOULDER;  Surgeon: Tarry Kos, MD;  Location: [facility] OR;  Service: Orthopedics;  Laterality: Left;  . I&D EXTREMITY Right 12/18/2016   Procedure: INCISION AND DRAINAGE ANKLE ABSCESS;  Surgeon: Eulah Pont,  Jewel Baize, MD;  Location: [facility] OR;  Service: Orthopedics;  Laterality: Right;  . INCISION AND DRAINAGE ABSCESS Right 08/19/2016   Procedure: DEBRIDEMENT RIGHT BUTTOCK  ABSCESS;  Surgeon: Manus Rudd, MD;  Location: [facility] OR;  Service: General;  Laterality: Right;  . IRRIGATION AND DEBRIDEMENT ABSCESS Right 08/19/2016   buttocks  . IRRIGATION AND DEBRIDEMENT SHOULDER Left 12/27/2015   Procedure: IRRIGATION AND DEBRIDEMENT  LEFT SHOULDER;  Surgeon: Tarry Kos, MD;  Location: [facility] OR;  Service: Orthopedics;  Laterality: Left;  . IRRIGATION AND DEBRIDEMENT SHOULDER Left 12/30/2015   Procedure: IRRIGATION AND DEBRIDEMENT LEFT SHOULDER;  Surgeon: Tarry Kos, MD;  Location: [facility];  Service: Orthopedics;  Laterality: Left;  . SKIN SPLIT GRAFT Left 01/29/2016   Procedure: SKIN GRAFT SPLIT THICKNESS,;  Surgeon: Tarry Kos, MD;  Location: [facility];  Service: Orthopedics;  Laterality: Left;    Social History   Social History Narrative  . Not on file     reports that he has been smoking cigarettes.  He has a 18.50 pack-year smoking history. He has never used smokeless tobacco. He reports that he has current or past drug history. Drugs: IV and Heroin. He reports that he does not drink alcohol.  Allergies  Allergen Reactions  . Ativan [Lorazepam] Other (See Comments)    HALLUCINATIONS    Family History  Problem Relation Age of Onset  . Diabetes Mother   . Heart disease Mother   . Hypertension Mother   . Stroke Mother   . Vision loss Mother   . Heart disease Father   . Hypertension Father   . Anesthesia problems Sister        post-op N/V  . Diabetes Sister     Prior to Admission medications   Medication Sig Start Date End Date Taking? Authorizing Provider  aspirin EC 81 MG tablet Take 1 tablet (81 mg total) by mouth daily. 04/04/14  Yes Ambrose Finland, NP  atorvastatin (LIPITOR) 40 MG tablet Take 1 tablet (40 mg total) by mouth daily. 05/26/17  Yes Hoy Register, MD  ferrous sulfate 325 (65 FE) MG tablet Take 1 tablet (325 mg total) by mouth 2 (two) times daily with a meal. 12/23/16  Yes Abrol, Germain Osgood, MD  gabapentin (NEURONTIN) 300 MG capsule TAKE 2 CAPSULE BY MOUTH AT BEDTIME FOR DIABETIC NERVE PAIN Patient taking differently: Take 600 mg by mouth at bedtime. TAKE 2 CAPSULE BY MOUTH AT BEDTIME FOR DIABETIC NERVE PAIN 05/26/17  Yes Hoy Register, MD  Insulin Glargine (LANTUS SOLOSTAR) 100  UNIT/ML Solostar Pen Inject 10 Units into the skin 2 (two) times daily. 10/08/17  Yes Standley Brooking, MD  mirtazapine (REMERON) 30 MG tablet Take 1 tablet (30 mg total) by mouth at bedtime. 05/26/17  Yes Newlin, [name], MD  NOVOLOG FLEXPEN 100 UNIT/ML FlexPen Inject 0-12 Units into the skin See admin instructions. Inject 0-12 units subcutaneously before meals as per sliding scale as directed 08/25/17  Yes [provider]  Pancrelipase, Lip-Prot-Amyl, (ZENPEP) 25000 units CPEP Take 25,000 Units by mouth 3 (three) times daily before meals. 11/03/16  Yes Hoy Register, MD  Lancets MISC 1 each by Does not apply route daily. Use as instructed to check blood sugar 11/03/17   Reather Littler, MD    Physical Exam:  Constitutional: NAD, calm, comfortable Vitals:   11/10/17 2015 11/10/17 2045 11/10/17 2130 11/10/17 2230  BP: 124/86 (!) 129/91 118/80 (!) 139/91  Pulse: 62 60 77 (!) 157  Resp:  Temp:      TempSrc:      SpO2: 100% 100% 100% 92%   Eyes: PERRL, lids and conjunctivae normal ENMT: Mucous membranes are dry. Posterior pharynx clear of any exudate or lesions.Normal dentition.  Neck: normal, supple, no masses, no thyromegaly Respiratory: clear to auscultation bilaterally, no wheezing, no crackles. Normal respiratory effort. No accessory muscle use.  Cardiovascular: Regular rate and rhythm, no murmurs / rubs / gallops. No extremity edema. 2+ pedal pulses. No carotid bruits.  Abdomen: no tenderness, no masses palpated. No hepatosplenomegaly. Bowel sounds positive.  Musculoskeletal: no clubbing / cyanosis. No joint deformity upper and lower extremities. Good ROM, no contractures. Normal muscle tone.  Skin: no rashes, lesions, ulcers. No induration; right upper extremity forearm has an area of ulceration where the abscess opened up.  Dressing is clean dry and intact.  Area measures 3 cm x 2 cm. Neurologic: CN 2-12 grossly intact. Sensation intact, DTR normal. Strength 5/5 in all 4.    Psychiatric: Normal judgment and insight. Alert and oriented x 3. Normal mood.    Labs on Admission: I have personally reviewed following labs and imaging studies  CBC: Recent Labs  Lab 11/10/17 1525  WBC 5.3  NEUTROABS 3.0  HGB 11.0*  HCT 34.7*  MCV 79.4  PLT 266   Basic Metabolic Panel: Recent Labs  Lab 11/10/17 1525  NA 129*  K 5.0  CL 96*  CO2 20*  GLUCOSE 748*  BUN 20  CREATININE 2.00*  CALCIUM 9.1   GFR: Estimated Creatinine Clearance: 26.8 mL/min (A) (by C-G formula based on SCr of 2 mg/dL (H)). Liver Function Tests: Recent Labs  Lab 11/10/17 1525  AST 51*  ALT 22  ALKPHOS 131*  BILITOT 0.5  PROT 8.3*  ALBUMIN 2.9*   CBG: Recent Labs  Lab 11/10/17 1837 11/10/17 2213  GLUCAP >600* 320*   Urine analysis:    Component Value Date/Time   COLORURINE YELLOW 05/20/2017 1138   APPEARANCEUR HAZY (A) 05/20/2017 1138   LABSPEC 1.018 05/20/2017 1138   PHURINE 5.0 05/20/2017 1138   GLUCOSEU >=500 (A) 05/20/2017 1138   GLUCOSEU >=1000 (A) 05/06/2017 1359   HGBUR MODERATE (A) 05/20/2017 1138   BILIRUBINUR NEGATIVE 05/20/2017 1138   BILIRUBINUR neg 09/26/2015 1531   KETONESUR 80 (A) 05/20/2017 1138   PROTEINUR NEGATIVE 05/20/2017 1138   UROBILINOGEN 0.2 05/06/2017 1359   NITRITE NEGATIVE 05/20/2017 1138   LEUKOCYTESUR SMALL (A) 05/20/2017 1138    Radiological Exams on Admission: Dg Forearm Right  Result Date: 11/10/2017 CLINICAL DATA:  Right forearm wound. EXAM: RIGHT FOREARM - 2 VIEW COMPARISON:  None. FINDINGS: Bandage material identified around the proximal forearm. Lucency over the scan may be related to the known soft tissue wound. No evidence for radiopaque soft tissue foreign body. No underlying bony abnormality. IMPRESSION: Negative. Electronically Signed   By: Kennith Center M.D.   On: 11/10/2017 16:16     Assessment/Plan Principal Problem:   Hyperosmolar non-ketotic state in patient with type 2 diabetes mellitus (HCC) Active Problems:    Heroin abuse (HCC)   Abscess   Type II diabetes mellitus with renal manifestations (HCC)   Alcoholic liver disease, unspecified (HCC)   Chronic alcoholic pancreatitis (HCC)   CKD (chronic kidney disease), stage III (HCC)   Alcohol abuse   Protein-calorie malnutrition, severe (HCC)   Tobacco abuse   Chronic hepatitis C (HCC)  1.  Hyperosmolar non-ketosis patient with type 2 diabetes mellitus: Hyponatremia is secondary to this as is his elevated  creatinine likely related to profound dehydration.  Lactate is up due to dehydration as well.  Will aggressively hydrate patient.  Will check every 4 hours fingerstick blood glucoses.  Suspect hyponatremia will resolve with treatment of hyperosmolar non-ketosis.  2.  Heroin abuse: Noted patient would benefit from Narcotics Anonymous.  Patient does not feel like he is going to withdraw.  3.  Abscess of the forearm: Noted on the right forearm related to heroin injection and both of bacteria.  Abscess burst yesterday with improvement in pain and discomfort.  Will need wound care evaluation.  4.  Type 2 diabetes mellitus with renal manifestations: Noted interestingly the patient's morning blood glucoses were low for this month at his endocrinologist appointment.  He lowered his Lantus from 15 units to 12 units daily.  5.  Alcoholic liver disease: Noted monitor closely.  6.  Chronic alcoholic pancreatitis: Noted avoid medications which can cause pancreatitis.  7.  Chronic kidney disease stage III: Avoid nephrotoxic agents monitor renal function.  Expected to improve some with IV fluids.  8.  Alcohol abuse: Noted.  9.  Protein calorie malnutrition, severe: We will ask nutritionist to see the patient.  10.  Tobacco abuse: Patient did listen kindly when I discussed with him smoking cessation.  I spent 3-1/2 minutes on smoking cessation.  11.  Chronic hepatitis C: Noted would refer patient to outpatient hepatology center for evaluation for possible  treatment.  DVT prophylaxis: Lovenox Code Status: Full code Family Communication: Patient retains capacity and no family was present. Disposition Plan: Likely home soon Consults called: None. Admission status: Patient   Lahoma Crocker MD FACP [facility] Pager [phone]  If 7PM-7AM, please contact night-coverage [email or web address] Password [email or web address]  11/11/2017, 12:12 AM

## 2017-11-10 NOTE — ED Provider Notes (Signed)
Patient placed in Quick Look pathway, seen and evaluated   Chief Complaint: Forearm wound  HPI:   Raymond Winters is a 57 y.o. male, presenting to the ED with right forearm wound for the past week.  States he began to have an abscess in the region due to injection of heroin.  Abscess continued to grow, then ruptured last night.  Pain is sharp, severe, nonradiating.  Last heroin use was 3 to 4 days ago.  Denies fever, numbness, other wounds.  ROS: Forearm wound   Physical Exam:   Gen: No distress  Neuro: Awake and Alert  Skin: Warm; approximately 3 cm wound to the right anterior forearm with purulent discharge.  Surrounding tenderness.    Focused Exam:   No diaphoresis.  No pallor.  Pulmonary: No increased work of breathing.  Speaks in full sentences without difficulty. No tachypnea.  Cardiac: Tachycardic.  Peripheral pulses intact.  Neurologic: Sensation to light touch intact in the bilateral upper extremities.  Grip strength equal.           Initiation of care has begun. The patient has been counseled on the process, plan, and necessity for staying for the completion/evaluation, and the remainder of the medical screening examination   Raymond Winters 11/10/17 1521    Gerhard Munch, MD 11/10/17 [phone]

## 2017-11-10 NOTE — ED Triage Notes (Addendum)
Pt states he had an IV injection of heroin to his right arm that became infected this month with a raised up abscess for several days that burst last night. Pt has quarter sized wound to right forearm. Tachycardic at triage but afebrile. Wound dressed with kerlix.

## 2017-11-10 NOTE — Progress Notes (Signed)
Pharmacy Antibiotic Note  Raymond Winters is a 57 y.o. male admitted on 11/10/2017 with wound infection.  Pharmacy has been consulted for Zosyn and vancomycin dosing. Patient has a h/o of heroin injection with an abscess near injection area. WBC wnl. LA 5.25. SCr 2 (BL ~ 1.2). CrCl ~ 35-40 mL/min.   Plan: -Vancomycin 1 gm IV once, then vancomycin 500 mg IV Q 24 hours -Zosyn 3.375 gm IV Q 8 hours (EI infusion) -Monitor CBC, renal fx, cultures and clinical progress -VT at SS      Temp (24hrs), Avg:97.5 F (36.4 C), Min:97.5 F (36.4 C), Max:97.5 F (36.4 C)  Recent Labs  Lab 11/10/17 1525 11/10/17 1546  WBC 5.3  --   CREATININE 2.00*  --   LATICACIDVEN  --  5.25*    Estimated Creatinine Clearance: 26.8 mL/min (A) (by C-G formula based on SCr of 2 mg/dL (H)).    Allergies  Allergen Reactions  . Ativan [Lorazepam] Other (See Comments)    HALLUCINATIONS    Antimicrobials this admission: Vanc 5/22 >>  Zosyn 5/22 >>   Dose adjustments this admission: None  Microbiology results:   Thank you for allowing pharmacy to be a part of this patient's care.  Vinnie Level, PharmD., BCPS Clinical Pharmacist Clinical phone for 11/10/17 until 11pm: [phone] If after 11pm, please call main pharmacy at: [phone]

## 2017-11-10 NOTE — ED Provider Notes (Signed)
[facility] EMERGENCY DEPARTMENT Provider Note   CSN: [id number] Arrival date & time: 11/10/17  1446     History   Chief Complaint Chief Complaint  Patient presents with  . Abscess    HPI Raymond Winters is a 57 y.o. male past medical history of polysubstance abuse, CKD, hepatitis C, insulin-dependent type 2 diabetes, presented to the ED with 1 week of worsening pain to right forearm.  He states a week ago he injected himself with heroin, the days following he noticed worsening redness and swelling to right forearm.  He states it grew in size and then burst without intervention last night.  He states the pain improved after that, however reports the ED for evaluation.  States he has not used drugs since that time.  Denies fever or chills.  States his sugars have been running high this week, with last dose of insulin last night before bedtime.  The history is provided by the patient.    Past Medical History:  Diagnosis Date  . Alcohol dependence (HCC) 01/05/2016  . Alcoholic liver disease, unspecified (HCC) 01/05/2016  . Arthritis    "lower extremeties, hands, shoulders, back" (08/20/2016)  . Chronic alcoholic pancreatitis (HCC) 01/05/2016  . CKD (chronic kidney disease), stage III (HCC)   . Depression   . Family history of adverse reaction to anesthesia    pt's sister has hx. of post-op N/V  . Hepatitis C    "never treated" (08/20/2016)  . Heroin abuse (HCC)   . High cholesterol   . Hypoalbuminemia due to protein-calorie malnutrition (HCC) 01/05/2016  . Insulin dependent diabetes mellitus (HCC)   . Lactic acid increased, CHRONIC 01/05/2016   Secondary to Liver Disease, Alcoholism, and Diabetes  . 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 Shoulder wound 01/2016   left    Patient Active Problem List   Diagnosis Date Noted  . Severe malnutrition (HCC) 10/07/2017  . HLD (hyperlipidemia) 10/06/2017  . Iron deficiency anemia 10/06/2017  . Abscess  of arm, left 10/06/2017  . CKD (chronic kidney disease), stage III (HCC)   . DKA (diabetic ketoacidoses) (HCC) 05/20/2017  . Polysubstance abuse (HCC) 05/20/2017  . Hypoglycemia associated with diabetes (HCC) 02/20/2017  . Ankle pain, right   . Ankle cellulitis 12/15/2016  . Cellulitis of foot, right 12/14/2016  . Cellulitis of right ankle 12/14/2016  . Chronic kidney disease (CKD), stage II (mild) 12/14/2016  . Non compliance w medication regimen 12/08/2016  . Necrotizing fasciitis (HCC) 08/19/2016  . Hypoglycemia 06/22/2016  . Depression 02/03/2016  . Chronic hepatitis C (HCC)   . Alcoholic liver disease, unspecified (HCC) 01/05/2016  . Alcohol dependence (HCC) 01/05/2016  . Chronic alcoholic pancreatitis (HCC) 01/05/2016  . Hypoalbuminemia due to protein-calorie malnutrition (HCC) 01/05/2016  . Lactic acid increased, CHRONIC 01/05/2016  . Diarrhea 01/04/2016  . Chronic diarrhea 01/04/2016  . Pressure ulcer 01/04/2016  . Uncontrolled type 2 diabetes mellitus with complication, with long-term current use of insulin (HCC)   . Swelling   . Type 2 diabetes mellitus with complication, with long-term current use of insulin (HCC)   . Septic joint of left shoulder region (HCC)   . Abscess 12/24/2015  . Abscess of left shoulder   . Leukocytosis   . Type 2 diabetes mellitus with complication, without long-term current use of insulin (HCC)   . Glenohumeral arthritis 07/24/2015  . Left shoulder pain 07/24/2015  . 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 Tobacco abuse 07/26/2014  .  Protein-calorie malnutrition, severe (HCC) 12/05/2013  . Hyperkalemia 12/04/2013  . Heroin withdrawal (HCC) 12/04/2013  . Alcohol abuse 12/04/2013  . Hyperglycemia 12/04/2013  . Metabolic acidosis with respiratory alkalosis 12/04/2013  . Heroin abuse (HCC) 04/04/2013  . Type II diabetes mellitus with renal manifestations (HCC) 03/20/2013    Past Surgical History:  Procedure Laterality Date  . APPLICATION OF  WOUND VAC Left 12/24/2015   Procedure: APPLICATION OF WOUND VAC; LEFT SHOULDER;  Surgeon: Tarry Kos, MD;  Location: [facility] OR;  Service: Orthopedics;  Laterality: Left;  . APPLICATION OF WOUND VAC Left 01/29/2016   Procedure: LEFT SHOULDER SPLIT THICKNESS SKIN GRAFT, WOUND VAC;  Surgeon: Tarry Kos, MD;  Location: [facility];  Service: Orthopedics;  Laterality: Left;  . I&D EXTREMITY Right 03/20/2013   Procedure: IRRIGATION AND DEBRIDEMENT EXTREMITY;  Surgeon: Marlowe Shores, MD;  Location: [facility] OR;  Service: Orthopedics;  Laterality: Right;  . I&D EXTREMITY Left 12/24/2015   Procedure:  DEBRIDEMENT OF MUSCLE, SUBCUTANEOUS TISSUE LEFT SHOULDER;  Surgeon: Tarry Kos, MD;  Location: [facility] OR;  Service: Orthopedics;  Laterality: Left;  . I&D EXTREMITY Right 12/18/2016   Procedure: INCISION AND DRAINAGE ANKLE ABSCESS;  Surgeon: Sheral Apley, MD;  Location: [facility] OR;  Service: Orthopedics;  Laterality: Right;  . INCISION AND DRAINAGE ABSCESS Right 08/19/2016   Procedure: DEBRIDEMENT RIGHT BUTTOCK  ABSCESS;  Surgeon: Manus Rudd, MD;  Location: [facility] OR;  Service: General;  Laterality: Right;  . IRRIGATION AND DEBRIDEMENT ABSCESS Right 08/19/2016   buttocks  . IRRIGATION AND DEBRIDEMENT SHOULDER Left 12/27/2015   Procedure: IRRIGATION AND DEBRIDEMENT LEFT SHOULDER;  Surgeon: Tarry Kos, MD;  Location: [facility] OR;  Service: Orthopedics;  Laterality: Left;  . IRRIGATION AND DEBRIDEMENT SHOULDER Left 12/30/2015   Procedure: IRRIGATION AND DEBRIDEMENT LEFT SHOULDER;  Surgeon: Tarry Kos, MD;  Location: [facility] OR;  Service: Orthopedics;  Laterality: Left;  . SKIN SPLIT GRAFT Left 01/29/2016   Procedure: SKIN GRAFT SPLIT THICKNESS,;  Surgeon: Tarry Kos, MD;  Location: [facility];  Service: Orthopedics;  Laterality: Left;        Home Medications    Prior to Admission medications   Medication Sig Start Date End Date Taking? Authorizing Provider  aspirin EC 81 MG tablet Take 1 tablet  (81 mg total) by mouth daily. 04/04/14  Yes Ambrose Finland, NP  atorvastatin (LIPITOR) 40 MG tablet Take 1 tablet (40 mg total) by mouth daily. 05/26/17  Yes Hoy Register, MD  ferrous sulfate 325 (65 FE) MG tablet Take 1 tablet (325 mg total) by mouth 2 (two) times daily with a meal. 12/23/16  Yes Abrol, Germain Osgood, MD  gabapentin (NEURONTIN) 300 MG capsule TAKE 2 CAPSULE BY MOUTH AT BEDTIME FOR DIABETIC NERVE PAIN Patient taking differently: Take 600 mg by mouth at bedtime. TAKE 2 CAPSULE BY MOUTH AT BEDTIME FOR DIABETIC NERVE PAIN 05/26/17  Yes Hoy Register, MD  Insulin Glargine (LANTUS SOLOSTAR) 100 UNIT/ML Solostar Pen Inject 10 Units into the skin 2 (two) times daily. 10/08/17  Yes Standley Brooking, MD  mirtazapine (REMERON) 30 MG tablet Take 1 tablet (30 mg total) by mouth at bedtime. 05/26/17  Yes Newlin, [name], MD  NOVOLOG FLEXPEN 100 UNIT/ML FlexPen Inject 0-12 Units into the skin See admin instructions. Inject 0-12 units subcutaneously before meals as per sliding scale as directed 08/25/17  Yes [provider]  Pancrelipase, Lip-Prot-Amyl, (ZENPEP) 25000 units CPEP Take 25,000 Units by mouth 3 (three) times  daily before meals. 11/03/16  Yes Hoy Register, MD  Lancets MISC 1 each by Does not apply route daily. Use as instructed to check blood sugar 11/03/17   Reather Littler, MD  polyethylene glycol [facility] / GLYCOLAX) packet Take 17 g by mouth daily. Patient not taking: Reported on 11/10/2017 12/23/16   Richarda Overlie, MD    Family History Family History  Problem Relation Age of Onset  . Diabetes Mother   . Heart disease Mother   . Hypertension Mother   . Stroke Mother   . Vision loss Mother   . Heart disease Father   . Hypertension Father   . Anesthesia problems Sister        post-op N/V  . Diabetes Sister     Social History Social History   Tobacco Use  . Smoking status: Current Every Day Smoker    Packs/day: 0.50    Years: 37.00    Pack years: 18.50    Types:  Cigarettes  . Smokeless tobacco: Never Used  Substance Use Topics  . Alcohol use: No    Alcohol/week: 0.0 oz    Comment: 08/20/2016 "stopped ~ 1 month ago"  . Drug use: Yes    Types: IV, Heroin    Comment: Heroin, last used 08/25/17     Allergies   Ativan [lorazepam]   Review of Systems Review of Systems  Constitutional: Negative for chills and fever.  Musculoskeletal: Positive for myalgias.  Skin: Positive for color change and wound.  Neurological: Negative for numbness.  All other systems reviewed and are negative.    Physical Exam Updated Vital Signs BP 123/82   Pulse 96   Temp (!) 97.4 F (36.3 C) (Oral)   Resp 18   SpO2 100%   Physical Exam  Constitutional: He appears well-developed and well-nourished.  Non-toxic appearance.  Thin male.  HENT:  Head: Normocephalic and atraumatic.  Mouth/Throat: Oropharynx is clear and moist.  Eyes: Conjunctivae are normal.  Cardiovascular: Normal rate, regular rhythm, normal heart sounds and intact distal pulses.  Pulmonary/Chest: Effort normal and breath sounds normal. No respiratory distress.  Abdominal: Soft.  Musculoskeletal:  Right anterior medial forearm with 3.5cm open wound draining purulent material.  Base of wound is pink and does not appear necrotic.  There is surrounding erythema and warmth.  Compartments are soft.  Intact distal pulses and sensation.`  Neurological: He is alert.  Skin: Skin is warm.  Psychiatric: He has a normal mood and affect. His behavior is normal.  Nursing note and vitals reviewed.        ED Treatments / Results  Labs (all labs ordered are listed, but only abnormal results are displayed) Labs Reviewed  COMPREHENSIVE METABOLIC PANEL - Abnormal; Notable for the following components:      Result Value   Sodium 129 (*)    Chloride 96 (*)    CO2 20 (*)    Glucose, Bld 748 (*)    Creatinine, Ser 2.00 (*)    Total Protein 8.3 (*)    Albumin 2.9 (*)    AST 51 (*)    Alkaline  Phosphatase 131 (*)    GFR calc non Af Amer 35 (*)    GFR calc Af Amer 41 (*)    All other components within normal limits  CBC WITH DIFFERENTIAL/PLATELET - Abnormal; Notable for the following components:   Hemoglobin 11.0 (*)    HCT 34.7 (*)    MCH 25.2 (*)    All other components within normal  limits  I-STAT CG4 LACTIC ACID, ED - Abnormal; Notable for the following components:   Lactic Acid, Venous 5.25 (*)    All other components within normal limits  I-STAT CG4 LACTIC ACID, ED - Abnormal; Notable for the following components:   Lactic Acid, Venous 3.93 (*)    All other components within normal limits  AEROBIC CULTURE (SUPERFICIAL SPECIMEN)    EKG None  Radiology Dg Forearm Right  Result Date: 11/10/2017 CLINICAL DATA:  Right forearm wound. EXAM: RIGHT FOREARM - 2 VIEW COMPARISON:  None. FINDINGS: Bandage material identified around the proximal forearm. Lucency over the scan may be related to the known soft tissue wound. No evidence for radiopaque soft tissue foreign body. No underlying bony abnormality. IMPRESSION: Negative. Electronically Signed   By: Kennith Center M.D.   On: 11/10/2017 16:16    Procedures Procedures (including critical care time)  Medications Ordered in ED Medications  sodium chloride 0.9 % bolus 1,000 mL (1,000 mLs Intravenous New Bag/Given 11/10/17 1832)  sodium chloride 0.9 % bolus 1,000 mL (1,000 mLs Intravenous New Bag/Given 11/10/17 1832)  vancomycin (VANCOCIN) IVPB 1000 mg/200 mL premix (has no administration in time range)  piperacillin-tazobactam (ZOSYN) IVPB 3.375 g (has no administration in time range)  insulin aspart (novoLOG) injection 10 Units (has no administration in time range)  vancomycin (VANCOCIN) 500 mg in sodium chloride 0.9 % 100 mL IVPB (has no administration in time range)  piperacillin-tazobactam (ZOSYN) IVPB 3.375 g (has no administration in time range)  HYDROcodone-acetaminophen (NORCO/VICODIN) 5-325 MG per tablet 1 tablet (has  no administration in time range)  HYDROcodone-acetaminophen (NORCO/VICODIN) 5-325 MG per tablet 1 tablet (1 tablet Oral Given 11/10/17 1531)     Initial Impression / Assessment and Plan / ED Course  I have reviewed the triage vital signs and the nursing notes.  Pertinent labs & imaging results that were available during my care of the patient were reviewed by me and considered in my medical decision making (see chart for details).  Clinical Course as of Nov 10 1833  Wed Nov 10, 2017  1830 Dr. Willette Pa with [facility] accepting admission.   [JR]    Clinical Course User Index [JR] Milea Klink, [place] N, PA-C   Pt presenting to the ED with infection to right forearm x1 week.  Patient reports he had abscess that ruptured last night, and is actively draining purulent drainage in the ED.  Compartments are soft, wound does not appear necrotic.  Neurovascularly intact.  Patient's initial lactic acid is 5.25.  No leukocytosis.  Blood glucose is 748, without evidence of DKA.  AKI present with creatinine of 2.00, up from 1.23 one month ago. Hyponatremic, likely 2/t hyperglycemia. Afebrile, does not meet sepsis criteria.  Slightly tachycardic on arrival, though with normal heart rate on evaluation. Normotensive.  Given patient's infection, hyperglycemia, and AKI, pt requiring admission.  2 L IV fluids, 10 units normal insulin, and antibiotics started. Repeat lactic 3.93, though drawn prior to receiving IVF, doubt accuracy of initial lactic.   Dr. Willette Pa with [facility] accepting admission. Wound culture sent.  The patient appears reasonably stabilized for admission considering the current resources, flow, and capabilities available in the ED at this time, and I doubt any other [facility] requiring further screening and/or treatment in the ED prior to admission.  Final Clinical Impressions(s) / ED Diagnoses   Final diagnoses:  Hyperglycemia  AKI (acute kidney injury) (HCC)  Abscess of right arm  Hyponatremia    ED  Discharge Orders  None       Hadden Steig, [place] N, PA-C 11/10/17 1934    Charlynne Pander, MD 11/10/17 2025

## 2017-11-10 NOTE — ED Notes (Signed)
Attempted iv stick x2 unsuccessfully  

## 2017-11-10 NOTE — ED Notes (Signed)
Pt reports that he uses IM heroin rather then IV because he has no veins left.

## 2017-11-11 ENCOUNTER — Other Ambulatory Visit: Payer: Self-pay

## 2017-11-11 ENCOUNTER — Inpatient Hospital Stay (HOSPITAL_COMMUNITY): Payer: Medicare Other

## 2017-11-11 ENCOUNTER — Encounter (HOSPITAL_COMMUNITY): Payer: Self-pay | Admitting: *Deleted

## 2017-11-11 DIAGNOSIS — L02413 Cutaneous abscess of right upper limb: Secondary | ICD-10-CM

## 2017-11-11 DIAGNOSIS — N179 Acute kidney failure, unspecified: Secondary | ICD-10-CM

## 2017-11-11 DIAGNOSIS — F1010 Alcohol abuse, uncomplicated: Secondary | ICD-10-CM

## 2017-11-11 DIAGNOSIS — L0291 Cutaneous abscess, unspecified: Secondary | ICD-10-CM

## 2017-11-11 LAB — BASIC METABOLIC PANEL
ANION GAP: 6 (ref 5–15)
BUN: 13 mg/dL (ref 6–20)
CHLORIDE: 114 mmol/L — AB (ref 101–111)
CO2: 21 mmol/L — AB (ref 22–32)
Calcium: 7.9 mg/dL — ABNORMAL LOW (ref 8.9–10.3)
Creatinine, Ser: 1.43 mg/dL — ABNORMAL HIGH (ref 0.61–1.24)
GFR calc non Af Amer: 53 mL/min — ABNORMAL LOW (ref >60)
Glucose, Bld: 119 mg/dL — ABNORMAL HIGH (ref 65–99)
Potassium: 5.3 mmol/L — ABNORMAL HIGH (ref 3.5–5.1)
Sodium: 141 mmol/L (ref 135–145)

## 2017-11-11 LAB — GLUCOSE, CAPILLARY
GLUCOSE-CAPILLARY: 122 mg/dL — AB (ref 65–99)
GLUCOSE-CAPILLARY: 123 mg/dL — AB (ref 65–99)
GLUCOSE-CAPILLARY: 187 mg/dL — AB (ref 65–99)
GLUCOSE-CAPILLARY: 76 mg/dL (ref 65–99)
Glucose-Capillary: 89 mg/dL (ref 65–99)

## 2017-11-11 LAB — HEMOGLOBIN A1C
Hgb A1c MFr Bld: 11.9 % — ABNORMAL HIGH (ref 4.8–5.6)
Mean Plasma Glucose: 294.83 mg/dL

## 2017-11-11 MED ORDER — COLLAGENASE 250 UNIT/GM EX OINT
TOPICAL_OINTMENT | Freq: Every day | CUTANEOUS | Status: DC
  Administered 2017-11-11 – 2017-11-12 (×2): via TOPICAL
  Filled 2017-11-11: qty 30

## 2017-11-11 MED ORDER — PANCRELIPASE (LIP-PROT-AMYL) 12000-38000 UNITS PO CPEP
24000.0000 [IU] | ORAL_CAPSULE | Freq: Three times a day (TID) | ORAL | Status: DC
  Administered 2017-11-11 – 2017-11-12 (×4): 24000 [IU] via ORAL
  Filled 2017-11-11 (×4): qty 2

## 2017-11-11 MED ORDER — ACETAMINOPHEN 325 MG PO TABS
650.0000 mg | ORAL_TABLET | Freq: Four times a day (QID) | ORAL | Status: DC
  Administered 2017-11-11 – 2017-11-12 (×5): 650 mg via ORAL
  Filled 2017-11-11 (×5): qty 2

## 2017-11-11 NOTE — Progress Notes (Signed)
[facility]                                                                              Patient Demographics  Raymond Winters, is a 57 y.o. male, DOB - Mar 14, 1961, ZOX:[id number]  Admit date - 11/10/2017   Admitting Physician Lahoma Crocker, MD  Outpatient Primary MD for the patient is Hoy Register, MD  Outpatient specialists:   LOS - 1  days   Medical records reviewed and are as summarized below:    Chief Complaint  Patient presents with  . Abscess       Brief summary   Patient is a 57 year old male with history of heroin abuse, recent injection in the right arm with resultant abscess, alcoholic liver disease, chronic alcoholic pancreatitis, CKD, depression, hyperlipidemia, diabetes, insulin-dependent presented to ED with right forearm wound for last 1 week.  Patient reported had an abscess in the area after he injected with heroin, slowly became red and swollen, abscess grew and then spontaneously ruptured on the night of admission.  Last heroin use 3 to 4 days ago.  In ED, patient was noticed to be in HONK, CBG >700.  Patient was admitted for further work-up.   Assessment & Plan    Principal Problem:   Hyperosmolar non-ketotic state in patient with type 2 diabetes mellitus (HCC), uncontrolled diabetes, lactic acidosis, hyponatremia -CBGs improving, continue IV fluids, NovoLog meal coverage 4 units 3 times daily AC, sliding scale insulin, Lantus 10 units twice a day -Diabetic coordinator consult placed, repeat hemoglobin A1c -HbA1c 13.6 on 09/14/2017  Active Problems: Right forearm abscess after injecting heroin -Appreciate wound care consult, abscess spontaneously had ruptured -Will obtain ultrasound of the right forearm to rule out any underlying abscess which may necessitate I&D -Continue IV vancomycin and Zosyn, will transition to oral antibiotics in a.m. if improving -Discussed in detail with the patient regarding heroin cessation, dressing changes he  will need at home.  Alcoholic liver disease - Currently stable, follow closely for any withdrawals, patient denies drinking alcohol    Heroin abuse (HCC) -Last use 4 days ago, monitor closely for any withdrawals  Chronic alcoholic pancreatitis, severe protein calorie malnutrition - Continue pain control, pancreatic enzyme supplementation    Tobacco abuse -Counseled on smoking cessation  Acute on chronic kidney disease stage III likely due to honk, dehydration -Continue IV fluid hydration, obtain BMET -Baseline creatinine 1.2, admitted with creatinine of 2.0 with lactic acidosis  Hyponatremia: Pseudohyponatremia secondary to hyperglycemia Recheck BMET   Code Status:full  DVT Prophylaxis:  Lovenox  Family Communication: Discussed in detail with the patient, all imaging results, lab results explained to the patient    Disposition Plan:   Time Spent in minutes   25 minutes  Procedures:  None   Consultants:   Wound care  Antimicrobials:   IV vancomycin, IV Zosyn 5/22   Medications  Scheduled Meds: . acetaminophen  650 mg Oral Q6H  . aspirin EC  81 mg Oral Daily  . atorvastatin  40 mg Oral Daily  . collagenase   Topical Daily  . enoxaparin (LOVENOX) injection  30 mg Subcutaneous Daily  . ferrous  sulfate  325 mg Oral BID WC  . gabapentin  600 mg Oral QHS  . insulin aspart  0-15 Units Subcutaneous TID WC  . insulin aspart  0-5 Units Subcutaneous QHS  . insulin aspart  4 Units Subcutaneous TID WC  . insulin glargine  10 Units Subcutaneous BID  . mirtazapine  30 mg Oral QHS  . multivitamin with minerals  1 tablet Oral Daily  . sodium chloride flush  3 mL Intravenous Q12H  . ZENPEP  25,000 Units Oral TID AC   Continuous Infusions: . sodium chloride 150 mL/hr at 11/11/17 0545  . sodium chloride    . piperacillin-tazobactam (ZOSYN)  IV Stopped (11/11/17 0544)  . vancomycin     PRN Meds:.sodium chloride, albuterol, ketorolac, ondansetron **OR** ondansetron  (ZOFRAN) IV, oxyCODONE, oxyCODONE, sodium chloride flush, traZODone   Antibiotics   Anti-infectives (From admission, onward)   Start     Dose/Rate Route Frequency Ordered Stop   11/11/17 1800  vancomycin (VANCOCIN) 500 mg in sodium chloride 0.9 % 100 mL IVPB     500 mg 100 mL/hr over 60 Minutes Intravenous Every 24 hours 11/10/17 1727     11/11/17 0000  piperacillin-tazobactam (ZOSYN) IVPB 3.375 g     3.375 g 12.5 mL/hr over 240 Minutes Intravenous Every 8 hours 11/10/17 1727     11/10/17 1730  piperacillin-tazobactam (ZOSYN) IVPB 3.375 g     3.375 g 100 mL/hr over 30 Minutes Intravenous  Once 11/10/17 1715 11/10/17 1934   11/10/17 1715  vancomycin (VANCOCIN) IVPB 1000 mg/200 mL premix     1,000 mg 200 mL/hr over 60 Minutes Intravenous  Once 11/10/17 1715 11/10/17 2003        Subjective:   Raymond Winters was seen and examined today.  States pain in the right forearm better, upset over the dressing changes, "everybody wants to look at my wound, why can't you all come at the same time/" Patient denies dizziness, chest pain, shortness of breath, abdominal pain, N/V/D/C, new weakness, numbess, tingling.  Objective:   Vitals:   11/10/17 2230 11/11/17 0014 11/11/17 0529 11/11/17 0833  BP: (!) 139/91 110/69 (!) 111/98 130/77  Pulse: (!) 157 66 65 62  Resp:  Temp:  97.8 F (36.6 C) 98 F (36.7 C) 97.7 F (36.5 C)  TempSrc:  Oral Oral Oral  SpO2: 92% 100% 100% 100%  Weight:  47 kg (103 lb 9.9 oz)    Height:   (1.702 m)      Intake/Output Summary (Last 24 hours) at 11/11/2017 0925 Last data filed at 11/11/2017 0900 Gross per 24 hour  Intake 1407.5 ml  Output 825 ml  Net 582.5 ml     Wt Readings from Last 3 Encounters:  11/11/17 47 kg (103 lb 9.9 oz)  11/03/17 47.1 kg (103 lb 12.8 oz)  10/07/17 52.2 kg (115 lb 1.3 oz)     Exam  General: Alert and oriented x 3, NAD  Eyes:   HEENT:  Atraumatic, normocephalic  Cardiovascular: S1 S2 auscultated, Regular  rate and rhythm.  Respiratory: Clear to auscultation bilaterally, no wheezing, rales or rhonchi  Gastrointestinal: Soft, nontender, nondistended, + bowel sounds  Ext: no pedal edema bilaterally  Neuro: no neuro deficits  Musculoskeletal: No digital cyanosis, clubbing  Skin: Right forearm with area of abscess, granulation tissue, no active pus, 3x 3cm, surrounding erythema and induration  Psych: Normal affect and demeanor, alert and oriented x3    Data Reviewed:  I have  personally reviewed following labs and imaging studies  Micro Results Recent Results (from the past 240 hour(s))  Wound or Superficial Culture     Status: None (Preliminary result)   Collection Time: 11/10/17  6:27 PM  Result Value Ref Range Status   Specimen Description WOUND RIGHT ARM  Final   Special Requests NONE  Final   Gram Stain   Final    MODERATE WBC PRESENT,BOTH PMN AND MONONUCLEAR ABUNDANT GRAM NEGATIVE RODS ABUNDANT GRAM VARIABLE ROD FEW GRAM POSITIVE COCCI Performed at [facility], [address], [place], [place] [address]    Culture PENDING  Incomplete   Report Status PENDING  Incomplete    Radiology Reports Dg Forearm Right  Result Date: 11/10/2017 CLINICAL DATA:  Right forearm wound. EXAM: RIGHT FOREARM - 2 VIEW COMPARISON:  None. FINDINGS: Bandage material identified around the proximal forearm. Lucency over the scan may be related to the known soft tissue wound. No evidence for radiopaque soft tissue foreign body. No underlying bony abnormality. IMPRESSION: Negative. Electronically Signed   By: Kennith Center M.D.   On: 11/10/2017 16:16    Lab Data:  CBC: Recent Labs  Lab 11/10/17 1525  WBC 5.3  NEUTROABS 3.0  HGB 11.0*  HCT 34.7*  MCV 79.4  PLT 266   Basic Metabolic Panel: Recent Labs  Lab 11/10/17 1525  NA 129*  K 5.0  CL 96*  CO2 20*  GLUCOSE 748*  BUN 20  CREATININE 2.00*  CALCIUM 9.1   GFR: Estimated Creatinine Clearance: 26.8 mL/min (A) (by C-G  formula based on SCr of 2 mg/dL (H)). Liver Function Tests: Recent Labs  Lab 11/10/17 1525  AST 51*  ALT 22  ALKPHOS 131*  BILITOT 0.5  PROT 8.3*  ALBUMIN 2.9*   No results for input(s): LIPASE, AMYLASE in the last 168 hours. No results for input(s): AMMONIA in the last 168 hours. Coagulation Profile: No results for input(s): INR, PROTIME in the last 168 hours. Cardiac Enzymes: No results for input(s): CKTOTAL, CKMB, CKMBINDEX, TROPONINI in the last 168 hours. BNP (last 3 results) No results for input(s): PROBNP in the last 8760 hours. HbA1C: No results for input(s): HGBA1C in the last 72 hours. CBG: Recent Labs  Lab 11/10/17 1837 11/10/17 2213 11/11/17 0020 11/11/17 0735  GLUCAP >600* 320* 187* 123*   Lipid Profile: No results for input(s): CHOL, HDL, LDLCALC, TRIG, CHOLHDL, LDLDIRECT in the last 72 hours. Thyroid Function Tests: No results for input(s): TSH, T4TOTAL, FREET4, T3FREE, THYROIDAB in the last 72 hours. Anemia Panel: No results for input(s): VITAMINB12, FOLATE, FERRITIN, TIBC, IRON, RETICCTPCT in the last 72 hours. Urine analysis:    Component Value Date/Time   COLORURINE YELLOW 05/20/2017 1138   APPEARANCEUR HAZY (A) 05/20/2017 1138   LABSPEC 1.018 05/20/2017 1138   PHURINE 5.0 05/20/2017 1138   GLUCOSEU >=500 (A) 05/20/2017 1138   GLUCOSEU >=1000 (A) 05/06/2017 1359   HGBUR MODERATE (A) 05/20/2017 1138   BILIRUBINUR NEGATIVE 05/20/2017 1138   BILIRUBINUR neg 09/26/2015 1531   KETONESUR 80 (A) 05/20/2017 1138   PROTEINUR NEGATIVE 05/20/2017 1138   UROBILINOGEN 0.2 05/06/2017 1359   NITRITE NEGATIVE 05/20/2017 1138   LEUKOCYTESUR SMALL (A) 05/20/2017 1138     Ripudeep Rai M.D. [facility] 11/11/2017, 9:25 AM  Pager: [phone] Between 7am to 7pm - call Pager - [phone]  After 7pm go to [email or web address] - password [email or web address]  Call night coverage person covering after 7pm

## 2017-11-11 NOTE — Care Management Note (Addendum)
Case Management Note  Patient Details  Name: Raymond Winters MRN: [id number] Date of Birth: May 22, 1961  Subjective/Objective: history of heroin abuse with recent injection resultant abscess of right arm, alcoholic liver disease, chronic alcoholic pancreatitis chronic, chronic kidney disease, depression, hepatitis C, hyperlipidemia.  Admitted for Hyperosmolar non-ketotic state in patient with type 2 diabetes mellitus (HCC), uncontrolled diabetes, lactic acidosis, hyponatremia and Right forearm abscess after injecting heroin.  PCP noted.     Action/Plan: Prior to admission patient lived at home.  At discharge patient plans to return to same living situation.  Uses [facility].  Patient able to afford medications/food. NCM will continue to follow for discharge transition needs.  Expected Discharge Date:   11/11/17               Expected Discharge Plan:  Home/Self Care  In-House Referral:  (Diabetic coordinator ), Wound Care Consult.   Discharge planning Services  CM Consult  Status of Service:  In process, will continue to follow  Yancey Flemings, RN 11/11/2017, 11:23 AM

## 2017-11-11 NOTE — Progress Notes (Signed)
Patient tolerated dressing change to right forearm abscess well. Gave PRN medication before doing dressing change. Will continue to monitor.

## 2017-11-11 NOTE — Consult Note (Signed)
WOC Nurse wound consult note Reason for Consult: Right forearm abscess site Area measures 3 cm x 3 cm x unknown depth due to slough in wound bed. Area very tender to touch.  Surrounding erythema and induration. Plan:  Apply Santyl to wound bed. Cover with saline moistened gauze, then dry gauze or ABD pad.  Change daily. Monitor the wound area(s) for worsening of condition such as: Signs/symptoms of infection,  Increase in size,  Development of or worsening of odor, Development of pain, or increased pain at the affected locations.  Notify the medical team if any of these develop.  Thank you for the consult.  Discussed plan of care with the patient and bedside nurse.  WOC nurse will not follow at this time.  Please re-consult the WOC team if needed.  Helmut Muster, RN, MSN, CWOCN, CNS-BC, pager [phone]

## 2017-11-11 NOTE — Consult Note (Signed)
   [facility] CM Inpatient Consult   11/11/2017  [name] 04-28-1961 [id number]  Patient screened for multiple admissions and high risk score for unplanned readmission and need for  [facility] Care Management in Medicare services.  Chart review reveals and per notes the patient is Raymond Winters 57 year old with  history ofheroin abuse with recent injection resultant abscess of right arm, alcoholic liver disease, chronic alcoholic pancreatitis chronic, chronic kidney disease, depression, hepatitis C, hyperlipidemia.  Admitted for Hyperosmolar non-ketotic state in patient with type 2 diabetes mellitus (HCC),uncontrolled diabetes, lactic acidosis, hyponatremia and Right forearm abscess after injecting heroin. Patient was awake but back to sleep when asking questions.  His primary care provider is with [facility], Dr. Hoy Register.  Patient request brochure to be left at bedside.  Follow up at a more appropriate time, if time permits.  Charlesetta Shanks, RN BSN CCM [facility]  [phone] business mobile phone Toll free office [phone]

## 2017-11-11 NOTE — Progress Notes (Addendum)
Inpatient Diabetes Program Recommendations  AACE/ADA: New Consensus Statement on Inpatient Glycemic Control (2015)  Target Ranges:  Prepandial:   less than 140 mg/dL      Peak postprandial:   less than 180 mg/dL (1-2 hours)      Critically ill patients:  140 - 180 mg/dL   Lab Results  Component Value Date   GLUCAP 123 (H) 11/11/2017   HGBA1C 13.6 09/14/2017    Review of Glycemic Control Results for Raymond Winters, Raymond Winters (MRN [id number]) as of 11/11/2017 10:16  Ref. Range 11/10/2017 22:13 11/11/2017 00:20 11/11/2017 07:35  Glucose-Capillary Latest Ref Range: 65 - 99 mg/dL 320 (H) 187 (H) 123 (H)   Diabetes history: Type 2 DM Outpatient Diabetes medications: Lantus 12 units BID, Novolog 2-12 units TID Current orders for Inpatient glycemic control: Novolog 0-5 units QHS, Novolog 0-15 units TID, Novolog 4 units TID, Lantus 10 units BID  Inpatient Diabetes Program Recommendations:    Noted this is patient's 3rd admission in last 6 months. Has been seen several times by multiple DM coordinators for education. Dr Dwyane Dee (endocrinology) manages this patient's diabetes outpatient and per last note on 11/03/17- Lantus increased to 12 units BID and patient has not yet scheduled appointment for dietitian. Patient not checking BS at this visit and encouraged to continue checking at minimum of twice per day. Additionally, social worker met with patient on 05/26/17 and noted that patient was having difficulty managing diabetes and needs assistance daily with checking BS.   Noted new orders that are placed, agree with current orders. Will plan to see patient today to assess needs.   Addendum @ 1515: Attempted to speak with patient regarding outpatient diabetes management. Patient requested for DM coordinator to return at a later time because he was sleeping. Will plan to follow up tomorrow. LM  Thanks, Bronson Curb, MSN, RNC-OB Diabetes Coordinator [phone] (8a-5p)

## 2017-11-12 LAB — GLUCOSE, CAPILLARY
GLUCOSE-CAPILLARY: 76 mg/dL (ref 65–99)
Glucose-Capillary: 169 mg/dL — ABNORMAL HIGH (ref 65–99)

## 2017-11-12 MED ORDER — COLLAGENASE 250 UNIT/GM EX OINT: TOPICAL_OINTMENT | Freq: Every day | CUTANEOUS | 4 refills | Status: AC

## 2017-11-12 MED ORDER — INSULIN ASPART 100 UNIT/ML ~~LOC~~ SOLN
3.0000 [IU] | Freq: Three times a day (TID) | SUBCUTANEOUS | Status: DC
  Administered 2017-11-12: 3 [IU] via SUBCUTANEOUS

## 2017-11-12 MED ORDER — ACETAMINOPHEN-CODEINE #3 300-30 MG PO TABS: 1.0000 | ORAL_TABLET | ORAL | 0 refills | Status: AC | PRN

## 2017-11-12 MED ORDER — DOXYCYCLINE HYCLATE 100 MG PO CAPS: 100.0000 mg | ORAL_CAPSULE | Freq: Two times a day (BID) | ORAL | 0 refills | Status: AC

## 2017-11-12 NOTE — Progress Notes (Signed)
Patient discharged to home. Patient AVS reviewed and signed. Patient capable re-verbalizing medications and follow-up appointments. IV removed. Patient belongings sent with patient. Patient educated to return to the ED in the event of SOB, chest pain or dizziness.   Marlaine Arey, RN 

## 2017-11-12 NOTE — Progress Notes (Signed)
Patient stated he wants to go to rehab. MD aware, and I talked to social work. Will continue to monitor.  Jaynee Eagles, RN

## 2017-11-12 NOTE — Clinical Social Work Note (Addendum)
CSW received consult to provide substance abuse resources to patient. Raymond Winters reported that he has been to [facility] before for inpatient drug treatment. Per patient, his brother lives with him. Raymond Winters provided with substance abuse treatment resources (inpatient and outpatient) and encouraged to contact [facility] as he did not express any concerns regarding his prior experience with this treatment facility. Patient will discharge home today. CSW signing off as no other SW intervention services needed.  Per patient request, bus pass given to nurse for patient to get home.  Raymond Winters, MSW, LCSW Licensed Clinical Social Worker Clinical Social Work Department [employer] [phone]

## 2017-11-12 NOTE — Progress Notes (Addendum)
In to speak with patient, discussed recommendation for Home Health: RN.  Offered patient choice, selected [facility].  Referral called to [facility] discharge liaison for [facility] and declined due patient not meeting the following medicare criteria: home bound; also patient has history of non-compliance.  Referral for Home Health RN, called to [facility] discharge liaison Alvino Chapel. NCM discussed with Attending Dr. Isidoro Donning, patient does not meet criteria has home bound to qualify for home health services at this time. NCM called Patient's PCP provider [facility], unable to make appointment with Dr. Alvis Lemmings, advised to have patient call practice first thing Tuesday morning 5/28 to schedule hospital follow up for the coming week.   NCM in to speak with patient regarding discharge orders for today.  NCM advised patient first thing Tuesday Morning to contact [facility] office to schedule hospital follow up. Patient verbalized understanding.

## 2017-11-12 NOTE — Progress Notes (Signed)
Inpatient Diabetes Program Recommendations  AACE/ADA: New Consensus Statement on Inpatient Glycemic Control (2015)  Target Ranges:  Prepandial:   less than 140 mg/dL      Peak postprandial:   less than 180 mg/dL (1-2 hours)      Critically ill patients:  140 - 180 mg/dL   Lab Results  Component Value Date   GLUCAP 76 11/12/2017   HGBA1C 11.9 (H) 11/11/2017    Review of Glycemic Control  Diabetes history: DM2 Outpatient Diabetes medications: Lantus 10 units bid, Novolog 0-12 units tidwc s/s Current orders for Inpatient glycemic control: Lantus 10 units bid, Novolog 0-15 units tidwc and hs + 3 units tidwc  Blood sugars stable. Spoke with pt regarding his glycemic control at home. Pt states he had lost his meter, but recently found it. States he needs to get back on track with checking blood sugars and taking his insulin. Pt states he uses cocaine and heroin every other day and said " I'm addicted." States he needs rehab to try to get off drugs. RN notified of this conversation. Discussed HgbA1C of 11.9% and hypoglycemia s/s and treatment.  Inpatient Diabetes Program Recommendations:     Continue same. Encouraged pt to f/u with PCP for management of DM.  Thank you. Ailene Ards, RD, LDN, CDE Inpatient Diabetes Coordinator [phone]

## 2017-11-12 NOTE — Progress Notes (Signed)
  RD consulted for nutrition education regarding diabetes.   Lab Results  Component Value Date   HGBA1C 11.9 (H) 11/11/2017    RD provided "Carbohydrate Counting for People with Diabetes" handout from the Academy of Nutrition and Dietetics. Discussed different food groups and their effects on blood sugar, emphasizing carbohydrate-containing foods. Provided list of carbohydrates and recommended serving sizes of common foods.  Discussed importance of controlled and consistent carbohydrate intake throughout the day. Provided examples of ways to balance meals/snacks and encouraged intake of high-fiber, whole grain complex carbohydrates. Teach back method used.  Expect fair compliance.  Pt discharging today. Reports being non compliant with insulin regimen. He has had multiple diet educations in the past from his endocrinologist per his reports. We went over the handout briefly right before discharge. Answered of his all questions. He was supposed to schedule an outpatient appointment prior to admission but the office could not reach him. Pt would still benefit from outpatient follow up.   Vanessa Kick RD, LDN Clinical Nutrition Pager # [phone]

## 2017-11-12 NOTE — Discharge Summary (Signed)
Physician Discharge Summary   Patient ID: Raymond Winters MRN: [id number] DOB/AGE: 59-Nov-1962 59 y.o.  Admit date: 11/10/2017 Discharge date: 11/12/2017  Primary Care Physician:  Hoy Register, MD   Recommendations for Outpatient Follow-up:  1. Follow up with PCP in 1-2 weeks  Home Health: Home health RN Equipment/Devices: None  Discharge Condition: stable CODE STATUS: FULL Diet recommendation: Carb modified diet   Discharge Diagnoses:   Cellulitis and abscess of right forearm . Hyperosmolar non-ketotic state in patient with type 2 diabetes mellitus (HCC) . Type II diabetes mellitus with renal manifestations (HCC) . Heroin abuse (HCC) . Alcohol abuse . Protein-calorie malnutrition, severe (HCC) . Tobacco abuse . Alcoholic liver disease, unspecified (HCC) . Chronic alcoholic pancreatitis (HCC) . Chronic hepatitis C (HCC) . CKD (chronic kidney disease), stage III (HCC)   Consults: Wound care    Allergies:   Allergies  Allergen Reactions  . Ativan [Lorazepam] Other (See Comments)    HALLUCINATIONS     DISCHARGE MEDICATIONS: Allergies as of 11/12/2017      Reactions   Ativan [lorazepam] Other (See Comments)   HALLUCINATIONS      Medication List    TAKE these medications   acetaminophen-codeine 300-30 MG tablet Commonly known as:  TYLENOL #3 Take 1 tablet by mouth every 4 (four) hours as needed for up to 5 days for moderate pain or severe pain.   aspirin EC 81 MG tablet Take 1 tablet (81 mg total) by mouth daily.   atorvastatin 40 MG tablet Commonly known as:  LIPITOR Take 1 tablet (40 mg total) by mouth daily.   collagenase ointment Commonly known as:  SANTYL Apply topically daily. Apply Santyl to right forearm wound bed. Cover with saline moistened gauze, then dry gauze or ABD pad.  Change daily.   doxycycline 100 MG capsule Commonly known as:  VIBRAMYCIN Take 1 capsule (100 mg total) by mouth 2 (two) times daily for 10 days.   ferrous sulfate 325  (65 FE) MG tablet Take 1 tablet (325 mg total) by mouth 2 (two) times daily with a meal.   gabapentin 300 MG capsule Commonly known as:  NEURONTIN TAKE 2 CAPSULE BY MOUTH AT BEDTIME FOR DIABETIC NERVE PAIN What changed:    how much to take  how to take this  when to take this  additional instructions   Insulin Glargine 100 UNIT/ML Solostar Pen Commonly known as:  LANTUS SOLOSTAR Inject 10 Units into the skin 2 (two) times daily.   Lancets Misc 1 each by Does not apply route daily. Use as instructed to check blood sugar   mirtazapine 30 MG tablet Commonly known as:  REMERON Take 1 tablet (30 mg total) by mouth at bedtime.   NOVOLOG FLEXPEN 100 UNIT/ML FlexPen Generic drug:  insulin aspart Inject 0-12 Units into the skin See admin instructions. Inject 0-12 units subcutaneously before meals as per sliding scale as directed   Pancrelipase (Lip-Prot-Amyl) 25000 units Cpep Commonly known as:  ZENPEP Take 25,000 Units by mouth 3 (three) times daily before meals.        Brief H and P: For complete details please refer to admission H and P, but in brief  Patient is a 57 year old male with history of heroin abuse, recent injection in the right arm with resultant abscess, alcoholic liver disease, chronic alcoholic pancreatitis, CKD, depression, hyperlipidemia, diabetes, insulin-dependent presented to ED with right forearm wound for last 1 week.  Patient reported had an abscess in the area after he injected with heroin,  slowly became red and swollen, abscess grew and then spontaneously ruptured on the night of admission.  Last heroin use 3 to 4 days ago.  In ED, patient was noticed to be in HONK, CBG >700.  Patient was admitted for further work-up.  Hospital Course:  Hyperosmolar non-ketotic state in patient with type 2 diabetes mellitus (HCC), uncontrolled diabetes, lactic acidosis, hyponatremia -CBGs now improving, possibly worsened due to infection.  He was recommended to continue  Lantus and sliding scale insulin as recommended by his endocrinologist.  -Diabetic coordinator consult placed, repeat hemoglobin A1c 11.9 -HbA1c 13.6 on 09/14/2017   Right forearm abscess after injecting heroin -Wound care was consulted.  Abscess had spontaneously ruptured  --Patient was placed on IV vancomycin and Zosyn. -Patient was started on Santyl enzymatic debridement by wound care, recommended dressing changes daily -Right upper extremity soft tissue ultrasound showed no focal or drainable fluid collections, cellulitis -Transition to oral doxycycline for 10 days  -Patient strongly counseled to stop injecting heroin  Alcoholic liver disease - Stable, no withdrawals.      Heroin abuse (HCC) -Last use 4 days ago prior to admission, no withdrawals.  Social work consult was obtained to give patient substance abuse resources.  Patient reported that he had been to [facility] for for inpatient drug treatment and was recommended to contact [facility] treatment facility.  Chronic alcoholic pancreatitis, severe protein calorie malnutrition - Continue pancreatic enzyme supplementation    Tobacco abuse -Counseled on smoking cessation  Acute on chronic kidney disease stage III likely due to honk, dehydration -Continue IV fluid hydration, obtain BMET -Baseline creatinine 1.2, admitted with creatinine of 2.0 with lactic acidosis  Hyponatremia: Pseudohyponatremia secondary to hyperglycemia Sodium 141 at the time of discharge.       Day of Discharge S: No complaints, feeling better  BP 120/72 (BP Location: Right Arm)   Pulse 73   Temp 98.4 F (36.9 C) (Oral)   Resp 18   Ht  (1.702 m)   Wt 50.7 kg (111 lb 12.4 oz)   SpO2 100%   BMI 17.51 kg/m   Physical Exam: General: Alert and awake oriented x3 not in any acute distress. HEENT: anicteric sclera, pupils reactive to light and accommodation CVS: S1-S2 clear no murmur rubs or gallops Chest: clear to auscultation bilaterally, no  wheezing rales or rhonchi Abdomen: soft nontender, nondistended, normal bowel sounds Extremities: no cyanosis, clubbing or edema noted bilaterally Neuro: Cranial nerves II-XII intact, no focal neurological deficits Skin: Dressing intact on the right forearm   The results of significant diagnostics from this hospitalization (including imaging, microbiology, ancillary and laboratory) are listed below for reference.      Procedures/Studies:  Dg Forearm Right  Result Date: 11/10/2017 CLINICAL DATA:  Right forearm wound. EXAM: RIGHT FOREARM - 2 VIEW COMPARISON:  None. FINDINGS: Bandage material identified around the proximal forearm. Lucency over the scan may be related to the known soft tissue wound. No evidence for radiopaque soft tissue foreign body. No underlying bony abnormality. IMPRESSION: Negative. Electronically Signed   By: Kennith Center M.D.   On: 11/10/2017 16:16   [place] Rt Upper Extrem Ltd Soft Tissue Non Vascular  Result Date: 11/11/2017 CLINICAL DATA:  Right forearm abscess after injecting heroin. Draining wound. EXAM: ULTRASOUND RIGHT UPPER EXTREMITY (forearm) LIMITED TECHNIQUE: Ultrasound examination of the upper extremity soft tissues was performed in the area of clinical concern. COMPARISON:  None for the right forearm. FINDINGS: Imaging of the right forearm over the area of draining wound  was performed. No focal drainable fluid collection is identified. There is nonspecific mild heterogeneity of the forearm musculature. Mild subcutaneous soft tissue thickening likely related to cellulitis of the forearm. No radiopaque foreign body is identified. IMPRESSION: 1. Mild diffuse thickening of the subcutaneous soft tissues of the forearm likely related to cellulitis. 2. No focal or drainable fluid collections are identified. No radiopaque foreign body is seen. Electronically Signed   By: Tollie Eth M.D.   On: 11/11/2017 19:41       LAB RESULTS: Basic Metabolic Panel: Recent Labs   Lab 11/10/17 1525 11/11/17 1038  NA 129* 141  K 5.0 5.3*  CL 96* 114*  CO2 20* 21*  GLUCOSE 748* 119*  BUN 20 13  CREATININE 2.00* 1.43*  CALCIUM 9.1 7.9*   Liver Function Tests: Recent Labs  Lab 11/10/17 1525  AST 51*  ALT 22  ALKPHOS 131*  BILITOT 0.5  PROT 8.3*  ALBUMIN 2.9*   No results for input(s): LIPASE, AMYLASE in the last 168 hours. No results for input(s): AMMONIA in the last 168 hours. CBC: Recent Labs  Lab 11/10/17 1525  WBC 5.3  NEUTROABS 3.0  HGB 11.0*  HCT 34.7*  MCV 79.4  PLT 266   Cardiac Enzymes: No results for input(s): CKTOTAL, CKMB, CKMBINDEX, TROPONINI in the last 168 hours. BNP: Invalid input(s): POCBNP CBG: Recent Labs  Lab 11/12/17 0748 11/12/17 1157  GLUCAP 76 169*      Disposition and Follow-up: Discharge Instructions    Diet Carb Modified   Complete by:  As directed    Discharge instructions   Complete by:  As directed    It is VERY IMPORTANT that you follow up with a PCP on a regular basis.  Check your blood glucoses before each meal and at bedtime and maintain a log of your readings.  Bring this log with you when you follow up with your PCP so that he or she can adjust your insulin at your follow up visit.  Apply Santyl ointment daily to right forearm wound, cover with saline moistened gauze, then dry gauze, change dressing daily.   Increase activity slowly   Complete by:  As directed         DISPOSITION: Home with home health RN   DISCHARGE FOLLOW-UP Follow-up Information    Hoy Register, MD In 1 week.   Specialty:  Family Medicine Why:  Please contact the office on Tuesday 11/16/2017 to schedule a follow up appointment. Thank you.  Contact information: [address] [place] [address] [phone]            Time coordinating discharge:  33 mins   Signed:   Thad Ranger M.D. [facility] 11/12/2017, 12:41 PM Pager: [phone]

## 2017-11-14 LAB — AEROBIC CULTURE W GRAM STAIN (SUPERFICIAL SPECIMEN)

## 2017-11-14 LAB — AEROBIC CULTURE  (SUPERFICIAL SPECIMEN)

## 2017-11-19 ENCOUNTER — Ambulatory Visit: Payer: Medicare Other | Admitting: Dietician

## 2017-12-10 ENCOUNTER — Telehealth: Payer: Self-pay | Admitting: Family Medicine

## 2017-12-10 NOTE — Telephone Encounter (Signed)
Call placed to patient to both his house and cell number # [phone] and # [phone], regarding DMA 3051 form. Need to ask patient if he dropped form off, what agency he is using and how many hours he is currently receiving. No answer. Left patient messages asking him to return my call at [phone]. Patient can also speak with Erskine SquibbJane.

## 2017-12-15 ENCOUNTER — Ambulatory Visit: Payer: Medicare Other | Admitting: Family Medicine

## 2017-12-16 ENCOUNTER — Other Ambulatory Visit: Payer: Self-pay | Admitting: Endocrinology

## 2017-12-22 ENCOUNTER — Ambulatory Visit: Payer: Medicare Other | Admitting: Family Medicine

## 2017-12-24 ENCOUNTER — Telehealth: Payer: Self-pay | Admitting: Family Medicine

## 2017-12-24 NOTE — Telephone Encounter (Signed)
Ansonga also mentioned that patient has been "nasty and rude" to her and family members. And she feels like he doesn't want to be helped.

## 2017-12-24 NOTE — Telephone Encounter (Signed)
Patient's sister returned my call later that day and informed me that she came by the office to drop off DMA 3051 form (PCS services) for her brother. Informed Ansonga, sister, that patient needed an office visit with provider to determine if he needed services. Reminded her that patient had an upcoming appointment on 7/3 at 2:30pm and informed her that she and patient can discuss PCS services with provider that day. Ansonga understood.    When asked about agancy of choice for her brother, Leitha Schullernsonga couldn't remember the name and informed me that she would return my call once she remembered.

## 2017-12-24 NOTE — Telephone Encounter (Signed)
Call placed to patient to reschedule his missed appointment and remind him of PCS services. Spoke with Ansonga, sister, and she was a bit upset that appointment had not been rescheduled. She stated that her brother had lied to her and this was not the first time.   During our conversation, Leitha Schullernsonga, informed me that she and other family members are worried for patient and the decisions he's making. She doesn't know if it's drug use or mental health which is affecting patient's status. Patient isn't taking his medications, lying to family members, stays up late at night, which means that he wakes up around 2pm the next day and he has been wearing depend diapers for the past year or so. Last scheduled appointment that patient had 7/3, Ansonga went to pick him up and patient was still laying in bed. Although she tried to encourage patient to get up and ready, patient refused to do so. Ansonga and family members are worried and don't know what else to do. Patient is living with his brother at the moment. Ansonga wants brother to be placed in a facility where he can be monitored and cared for as it's become a burden for her and family members.   Explained to Ansonga that she would need to come by the office with patient and have patient sign a DPR, allowing us to give her further details regarding patient's medical information. Ansonga understood. She also stated that she will try to get a POA that way she can arrange things to have brother admitted to a facility. Reminded Ansonga that I would inform provider of patient's status as well as reminded her that it's important that brother calls us to schedule an appointment. Based on finding in the appointment, provider can inform patient and her of what needs to be done to improve patient's status. Ansonga understood. Ansonga also provided me with other phone numbers that patient can be reached at [phone].   Message will be sent to provider.

## 2017-12-27 ENCOUNTER — Ambulatory Visit: Payer: Medicare Other | Attending: Family Medicine | Admitting: Family Medicine

## 2017-12-27 ENCOUNTER — Telehealth: Payer: Self-pay | Admitting: Endocrinology

## 2017-12-27 ENCOUNTER — Encounter: Payer: Self-pay | Admitting: Family Medicine

## 2017-12-27 ENCOUNTER — Other Ambulatory Visit: Payer: Self-pay

## 2017-12-27 ENCOUNTER — Telehealth: Payer: Self-pay

## 2017-12-27 VITALS — BP 104/71 | Temp 97.4°F | Ht 67.0 in | Wt 91.0 lb

## 2017-12-27 DIAGNOSIS — Z79899 Other long term (current) drug therapy: Secondary | ICD-10-CM | POA: Diagnosis not present

## 2017-12-27 DIAGNOSIS — E1122 Type 2 diabetes mellitus with diabetic chronic kidney disease: Secondary | ICD-10-CM | POA: Diagnosis not present

## 2017-12-27 DIAGNOSIS — F419 Anxiety disorder, unspecified: Secondary | ICD-10-CM

## 2017-12-27 DIAGNOSIS — F329 Major depressive disorder, single episode, unspecified: Secondary | ICD-10-CM | POA: Diagnosis not present

## 2017-12-27 DIAGNOSIS — E1142 Type 2 diabetes mellitus with diabetic polyneuropathy: Secondary | ICD-10-CM | POA: Diagnosis not present

## 2017-12-27 DIAGNOSIS — K860 Alcohol-induced chronic pancreatitis: Secondary | ICD-10-CM | POA: Insufficient documentation

## 2017-12-27 DIAGNOSIS — E43 Unspecified severe protein-calorie malnutrition: Secondary | ICD-10-CM

## 2017-12-27 DIAGNOSIS — Z681 Body mass index (BMI) 19 or less, adult: Secondary | ICD-10-CM | POA: Insufficient documentation

## 2017-12-27 DIAGNOSIS — F1020 Alcohol dependence, uncomplicated: Secondary | ICD-10-CM | POA: Insufficient documentation

## 2017-12-27 DIAGNOSIS — Z7982 Long term (current) use of aspirin: Secondary | ICD-10-CM | POA: Insufficient documentation

## 2017-12-27 DIAGNOSIS — R1013 Epigastric pain: Secondary | ICD-10-CM

## 2017-12-27 DIAGNOSIS — F1910 Other psychoactive substance abuse, uncomplicated: Secondary | ICD-10-CM | POA: Diagnosis not present

## 2017-12-27 DIAGNOSIS — E118 Type 2 diabetes mellitus with unspecified complications: Secondary | ICD-10-CM | POA: Diagnosis not present

## 2017-12-27 DIAGNOSIS — Z794 Long term (current) use of insulin: Secondary | ICD-10-CM | POA: Diagnosis not present

## 2017-12-27 DIAGNOSIS — L98491 Non-pressure chronic ulcer of skin of other sites limited to breakdown of skin: Secondary | ICD-10-CM | POA: Diagnosis not present

## 2017-12-27 DIAGNOSIS — N183 Chronic kidney disease, stage 3 (moderate): Secondary | ICD-10-CM | POA: Insufficient documentation

## 2017-12-27 DIAGNOSIS — Z9119 Patient's noncompliance with other medical treatment and regimen: Secondary | ICD-10-CM | POA: Diagnosis not present

## 2017-12-27 LAB — GLUCOSE, POCT (MANUAL RESULT ENTRY): POC GLUCOSE: 500 mg/dL — AB (ref 70–99)

## 2017-12-27 MED ORDER — OMEPRAZOLE 40 MG PO CPDR: 40.0000 mg | DELAYED_RELEASE_CAPSULE | Freq: Every day | ORAL | 3 refills | Status: AC

## 2017-12-27 MED ORDER — NOVOLOG FLEXPEN 100 UNIT/ML ~~LOC~~ SOPN: PEN_INJECTOR | SUBCUTANEOUS | 3 refills | Status: AC

## 2017-12-27 MED ORDER — INSULIN GLARGINE 100 UNIT/ML SOLOSTAR PEN: 12.0000 [IU] | PEN_INJECTOR | Freq: Two times a day (BID) | SUBCUTANEOUS | 0 refills | Status: DC

## 2017-12-27 MED ORDER — NOVOLOG FLEXPEN 100 UNIT/ML ~~LOC~~ SOPN: PEN_INJECTOR | SUBCUTANEOUS | 3 refills | Status: DC

## 2017-12-27 MED ORDER — MIRTAZAPINE 30 MG PO TABS: 30.0000 mg | ORAL_TABLET | Freq: Every day | ORAL | 5 refills | Status: AC

## 2017-12-27 MED ORDER — GABAPENTIN 300 MG PO CAPS: 600.0000 mg | ORAL_CAPSULE | Freq: Every day | ORAL | 3 refills | Status: AC

## 2017-12-27 MED ORDER — ATORVASTATIN CALCIUM 40 MG PO TABS: 40.0000 mg | ORAL_TABLET | Freq: Every day | ORAL | 5 refills | Status: AC

## 2017-12-27 MED ORDER — INSULIN GLARGINE 100 UNIT/ML SOLOSTAR PEN
12.0000 [IU] | PEN_INJECTOR | Freq: Two times a day (BID) | SUBCUTANEOUS | 0 refills | Status: DC
Start: 2017-12-27 — End: 2017-12-29

## 2017-12-27 MED ORDER — FLUOXETINE HCL 20 MG PO TABS: 20.0000 mg | ORAL_TABLET | Freq: Every day | ORAL | 3 refills | Status: DC

## 2017-12-27 MED ORDER — INSULIN ASPART 100 UNIT/ML ~~LOC~~ SOLN
10.0000 [IU] | Freq: Once | SUBCUTANEOUS | Status: AC
  Administered 2017-12-28: 10 [IU] via SUBCUTANEOUS

## 2017-12-27 MED FILL — MIRTAZAPINE 30 MG TABLET: 30 | 30 days supply | Qty: 30 | Fill #1

## 2017-12-27 NOTE — Telephone Encounter (Signed)
Rx sent 

## 2017-12-27 NOTE — Patient Instructions (Signed)
Heartburn Heartburn is a type of pain or discomfort that can happen in the throat or chest. It is often described as a burning pain. It may also cause a bad taste in the mouth. Heartburn may feel worse when you lie down or bend over, and it is often worse at night. Heartburn may be caused by stomach contents that move back up into the esophagus (reflux). Follow these instructions at home: Take these actions to decrease your discomfort and to help avoid complications. Diet  Follow a diet as recommended by your health care provider. This may involve avoiding foods and drinks such as: ? Coffee and tea (with or without caffeine). ? Drinks that contain alcohol. ? Energy drinks and sports drinks. ? Carbonated drinks or sodas. ? Chocolate and cocoa. ? Peppermint and mint flavorings. ? Garlic and onions. ? Horseradish. ? Spicy and acidic foods, including peppers, chili powder, curry powder, vinegar, hot sauces, and barbecue sauce. ? Citrus fruit juices and citrus fruits, such as oranges, lemons, and limes. ? Tomato-based foods, such as red sauce, chili, salsa, and pizza with red sauce. ? Fried and fatty foods, such as donuts, french fries, potato chips, and high-fat dressings. ? High-fat meats, such as hot dogs and fatty cuts of red and white meats, such as rib eye steak, sausage, ham, and bacon. ? High-fat dairy items, such as whole milk, butter, and cream cheese.  Eat small, frequent meals instead of large meals.  Avoid drinking large amounts of liquid with your meals.  Avoid eating meals during the 2-3 hours before bedtime.  Avoid lying down right after you eat.  Do not exercise right after you eat. General instructions  Pay attention to any changes in your symptoms.  Take over-the-counter and prescription medicines only as told by your health care provider. Do not take aspirin, ibuprofen, or other NSAIDs unless your health care provider told you to do so.  Do not use any tobacco  products, including cigarettes, chewing tobacco, and e-cigarettes. If you need help quitting, ask your health care provider.  Wear loose-fitting clothing. Do not wear anything tight around your waist that causes pressure on your abdomen.  Raise (elevate) the head of your bed about 6 inches (15 cm).  Try to reduce your stress, such as with yoga or meditation. If you need help reducing stress, ask your health care provider.  If you are overweight, reduce your weight to an amount that is healthy for you. Ask your health care provider for guidance about a safe weight loss goal.  Keep all follow-up visits as told by your health care provider. This is important. Contact a health care provider if:  You have new symptoms.  You have unexplained weight loss.  You have difficulty swallowing, or it hurts to swallow.  You have wheezing or a persistent cough.  Your symptoms do not improve with treatment.  You have frequent heartburn for more than two weeks. Get help right away if:  You have pain in your arms, neck, jaw, teeth, or back.  You feel sweaty, dizzy, or light-headed.  You have chest pain or shortness of breath.  You vomit and your vomit looks like blood or coffee grounds.  Your stool is bloody or black. This information is not intended to replace advice given to you by your health care provider. Make sure you discuss any questions you have with your health care provider. Document Released: 10/25/2008 Document Revised: 11/14/2015 Document Reviewed: 10/03/2014 Elsevier Interactive Patient Education  2018 Elsevier   Inc.  

## 2017-12-27 NOTE — Telephone Encounter (Signed)
[facility] [address]

## 2017-12-27 NOTE — Progress Notes (Signed)
Subjective:  Patient ID: Raymond Winters, male    DOB: 30-Mar-1961  Age: 57 y.o. MRN: [id number]  CC: Diabetes   HPI Raymond ButteryGary Winters is a 57 year old male with history of type 2 diabetes mellitus (A1c 11.9), diabetic neuropathy, chronic alcoholic pancreatitis, substance abuse, noncompliance who presents today for follow-up visit. In 10/2017 he had a hospitalization for hyperosmolar nonketotic state and right elbow abscess and informs me today his abscess is doing well however he continues to have hyperglycemia with a sugar of 500 in the clinic today.  He took 8 units of Lantus rather than 12 units prescribed and also took 5 units of NovoLog.  He has not been compliant with a diabetic diet.  His endocrinologist is Dr. Lucianne MussKumar and he is yet to schedule a follow-up visit with him for next month. He endorses some anxiety and depression due to his medical conditions but denies suicidal ideations or intent.  He endorses decreased appetite and complains of indigestion which he describes as difficulty swallowing foods or liquids which feel like they stick in his chest. He continues to use IV drugs. He is accompanied by his sister to today's office visit and they are requesting PCS services.  Past Medical History:  Diagnosis Date  . Alcohol dependence (HCC) 01/05/2016  . Alcoholic liver disease, unspecified (HCC) 01/05/2016  . Arthritis    "lower extremeties, hands, shoulders, back" (08/20/2016)  . Chronic alcoholic pancreatitis (HCC) 01/05/2016  . CKD (chronic kidney disease), stage III (HCC)   . Depression   . Family history of adverse reaction to anesthesia    pt's sister has hx. of post-op N/V  . Hepatitis C    "never treated" (08/20/2016)  . Heroin abuse (HCC)   . High cholesterol   . Hypoalbuminemia due to protein-calorie malnutrition (HCC) 01/05/2016  . Insulin dependent diabetes mellitus (HCC)   . Lactic acid increased, CHRONIC 01/05/2016   Secondary to Liver Disease, Alcoholism, and Diabetes  . 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 Shoulder wound 01/2016   left    Past Surgical History:  Procedure Laterality Date  . APPLICATION OF WOUND VAC Left 12/24/2015   Procedure: APPLICATION OF WOUND VAC; LEFT SHOULDER;  Surgeon: Tarry KosNaiping M Xu, MD;  Location: [facility] OR;  Service: Orthopedics;  Laterality: Left;  . APPLICATION OF WOUND VAC Left 01/29/2016   Procedure: LEFT SHOULDER SPLIT THICKNESS SKIN GRAFT, WOUND VAC;  Surgeon: Tarry KosNaiping M Xu, MD;  Location: [facility];  Service: Orthopedics;  Laterality: Left;  . I&D EXTREMITY Right 03/20/2013   Procedure: IRRIGATION AND DEBRIDEMENT EXTREMITY;  Surgeon: Marlowe ShoresMatthew A Weingold, MD;  Location: [facility] OR;  Service: Orthopedics;  Laterality: Right;  . I&D EXTREMITY Left 12/24/2015   Procedure:  DEBRIDEMENT OF MUSCLE, SUBCUTANEOUS TISSUE LEFT SHOULDER;  Surgeon: Tarry KosNaiping M Xu, MD;  Location: [facility] OR;  Service: Orthopedics;  Laterality: Left;  . I&D EXTREMITY Right 12/18/2016   Procedure: INCISION AND DRAINAGE ANKLE ABSCESS;  Surgeon: Sheral ApleyMurphy, Timothy D, MD;  [facility];  Service: Orthopedics;  Laterality: Right;  . INCISION AND DRAINAGE ABSCESS Right 08/19/2016   Procedure: DEBRIDEMENT RIGHT BUTTOCK  ABSCESS;  Surgeon: Manus RuddMatthew Tsuei, MD;  [facility];  Service: General;  Laterality: Right;  . IRRIGATION AND DEBRIDEMENT ABSCESS Right 08/19/2016   buttocks  . IRRIGATION AND DEBRIDEMENT SHOULDER Left 12/27/2015   Procedure: IRRIGATION AND DEBRIDEMENT LEFT SHOULDER;  Surgeon: Tarry KosNaiping M Xu, MD;  Location: [facility];  Service: Orthopedics;  Laterality: Left;  .  IRRIGATION AND DEBRIDEMENT SHOULDER Left 12/30/2015   Procedure: IRRIGATION AND DEBRIDEMENT LEFT SHOULDER;  Surgeon: Tarry Kos, MD;  Location: [facility];  Service: Orthopedics;  Laterality: Left;  . SKIN SPLIT GRAFT Left 01/29/2016   Procedure: SKIN GRAFT SPLIT THICKNESS,;  Surgeon: Tarry Kos, MD;  Location: [facility];  Service: Orthopedics;  Laterality: Left;     Allergies  Allergen Reactions  . Ativan [Lorazepam] Other (See Comments)    HALLUCINATIONS     Outpatient Medications Prior to Visit  Medication Sig Dispense Refill  . aspirin EC 81 MG tablet Take 1 tablet (81 mg total) by mouth daily. 30 tablet 5  . collagenase (SANTYL) ointment Apply topically daily. Apply Santyl to right forearm wound bed. Cover with saline moistened gauze, then dry gauze or ABD pad.  Change daily. 90 g 4  . ferrous sulfate 325 (65 FE) MG tablet Take 1 tablet (325 mg total) by mouth 2 (two) times daily with a meal. 60 tablet 3  . Lancets MISC 1 each by Does not apply route daily. Use as instructed to check blood sugar 100 each 3  . ONETOUCH VERIO test strip USE AS DIRECTED THREE TIMES DAILY BEFORE MEALS 300 each 1  . Pancrelipase, Lip-Prot-Amyl, (ZENPEP) 25000 units CPEP Take 25,000 Units by mouth 3 (three) times daily before meals. 90 capsule 3  . atorvastatin (LIPITOR) 40 MG tablet Take 1 tablet (40 mg total) by mouth daily. 30 tablet 5  . gabapentin (NEURONTIN) 300 MG capsule TAKE 2 CAPSULE BY MOUTH AT BEDTIME FOR DIABETIC NERVE PAIN (Patient taking differently: Take 600 mg by mouth at bedtime. TAKE 2 CAPSULE BY MOUTH AT BEDTIME FOR DIABETIC NERVE PAIN) 120 capsule 5  . mirtazapine (REMERON) 30 MG tablet Take 1 tablet (30 mg total) by mouth at bedtime. 30 tablet 5  . NOVOLOG FLEXPEN 100 UNIT/ML FlexPen Inject 0-12 Units into the skin See admin instructions. Inject 0-12 units subcutaneously before meals as per sliding scale as directed  3  . Insulin Glargine (LANTUS SOLOSTAR) 100 UNIT/ML Solostar Pen Inject 10 Units into the skin 2 (two) times daily. (Patient not taking: Reported on 12/27/2017)     No facility-administered medications prior to visit.     ROS Review of Systems  Constitutional: Negative for activity change and appetite change.  HENT: Negative for sinus pressure and sore throat.   Eyes: Negative for visual disturbance.  Respiratory: Negative for  cough, chest tightness and shortness of breath.   Cardiovascular: Negative for chest pain and leg swelling.  Gastrointestinal: Negative for abdominal distention, abdominal pain, constipation and diarrhea.  Endocrine: Negative.   Genitourinary: Negative for dysuria.  Musculoskeletal: Negative for joint swelling and myalgias.  Skin: Positive for wound. Negative for rash.  Allergic/Immunologic: Negative.   Neurological: Negative for weakness, light-headedness and numbness.  Psychiatric/Behavioral: Positive for dysphoric mood. Negative for suicidal ideas.       Positive for anxiety    Objective:  BP 104/71   Temp (!) 97.4 F (36.3 C) (Oral)   Ht 5\' 7"  (1.702 m)   Wt 91 lb (41.3 kg)   BMI 14.25 kg/m   BP/Weight 12/27/2017 11/12/2017 11/03/2017  Systolic BP 104 120 132  Diastolic BP 71 72 86  Wt. (Lbs) 91 111.77 103.8  BMI 14.25 17.51 16.26      Physical Exam  Constitutional: He is oriented to person, place, and time.  Malnourished  Cardiovascular: Normal rate, normal heart sounds and intact distal pulses.  No murmur  heard. Pulmonary/Chest: Effort normal and breath sounds normal. He has no wheezes. He has no rales. He exhibits no tenderness.  Abdominal: Soft. Bowel sounds are normal. He exhibits no distension and no mass. There is no tenderness.  Musculoskeletal: Normal range of motion.  Neurological: He is alert and oriented to person, place, and time.  Skin:  Ulcer on medial aspect of right elbow Multiple needlestick marks on extremities  Psychiatric: He has a normal mood and affect.      Office Visit from 12/27/2017 in [facility]  PHQ-2 Total Score  2      GAD 7 : Generalized Anxiety Score 12/27/2017 09/14/2017 05/26/2017 12/08/2016  Nervous, Anxious, on Edge 2 0 2 0  Control/stop worrying 2 0 1 1  Worry too much - different things 2 1 1 1   Trouble relaxing 2 1 2 1   Restless 2 1 0 0  Easily annoyed or irritable 3 1 3 1   Afraid - awful might  happen 3 1 2 1   Total GAD 7 Score 16 5 11 5      Lab Results  Component Value Date   HGBA1C 11.9 (H) 11/11/2017    CMP Latest Ref Rng & Units 11/11/2017 11/10/2017 10/06/2017  Glucose 65 - 99 mg/dL 161(W) 960(AV) 409(W)  BUN 6 - 20 mg/dL 13 20 10   Creatinine 0.61 - 1.24 mg/dL 1.19(J) 4.78(G) 9.56  Sodium 135 - 145 mmol/L 141 129(L) 136  Potassium 3.5 - 5.1 mmol/L 5.3(H) 5.0 4.0  Chloride 101 - 111 mmol/L 114(H) 96(L) 106  CO2 22 - 32 mmol/L 21(L) 20(L) 21(L)  Calcium 8.9 - 10.3 mg/dL 7.9(L) 9.1 7.5(L)  Total Protein 6.5 - 8.1 g/dL - 8.3(H) 6.5  Total Bilirubin 0.3 - 1.2 mg/dL - 0.5 0.5  Alkaline Phos 38 - 126 U/L - 131(H) 92  AST 15 - 41 U/L - 51(H) 35  ALT 17 - 63 U/L - 22 19     Assessment & Plan:   1. Type 2 diabetes mellitus with complication, with long-term current use of insulin (HCC) Uncontrolled with A1c of 11.9 10 units of NovoLog administered due to hyperglycemia 500 in the clinic Advised to avoid self adjusting Lantus and follow-up with endocrinology Counseled on Diabetic diet, my plate method, 213 minutes of moderate intensity exercise/week Keep blood sugar logs with fasting goals of 80-120 mg/dl, random of less than 086 and in the event of sugars less than 60 mg/dl or greater than 578 mg/dl please notify the clinic ASAP. It is recommended that you undergo annual eye exams and annual foot exams. Pneumonia vaccine is recommended. - POCT glucose (manual entry) - Hemoglobin A1c - atorvastatin (LIPITOR) 40 MG tablet; Take 1 tablet (40 mg total) by mouth daily.  Dispense: 30 tablet; Refill: 5 - insulin aspart (novoLOG) injection 10 Units - POCT glucose (manual entry)  2. Diabetic polyneuropathy associated with type 2 diabetes mellitus (HCC) Stable - gabapentin (NEURONTIN) 300 MG capsule; Take 2 capsules (600 mg total) by mouth at bedtime.  Dispense: 60 capsule; Refill: 3  3. Severe malnutrition (HCC) Last thyroid labs were negative Would love to check again given  weight loss however unable to obtain labs today - T4, free - TSH  4. Polysubstance abuse (HCC) Strongly encouraged to quit substance abuse  5. Ulcer of upper extremity, limited to breakdown of skin (HCC) Healing Provided dressing supplies in the clinic Advised to avoid substance abuse  6. Dyspepsia We will place on PPI and reassess at  next visit for possible referral for upper endoscopy if symptoms persist - omeprazole (PRILOSEC) 40 MG capsule; Take 1 capsule (40 mg total) by mouth daily.  Dispense: 30 capsule; Refill: 3  7. Anxiety and depression Due to underlying medical conditions We will place on LCSW scheduled - FLUoxetine (PROZAC) 20 MG tablet; Take 1 tablet (20 mg total) by mouth daily.  Dispense: 30 tablet; Refill: 3 - mirtazapine (REMERON) 30 MG tablet; Take 1 tablet (30 mg total) by mouth at bedtime.  Dispense: 30 tablet; Refill: 5   Meds ordered this encounter  Medications  . DISCONTD: Insulin Glargine (LANTUS SOLOSTAR) 100 UNIT/ML Solostar Pen    Sig: Inject 12 Units into the skin 2 (two) times daily.    Dispense:  15 mL    Refill:  0  . DISCONTD: NOVOLOG FLEXPEN 100 UNIT/ML FlexPen    Sig: Inject 0-12 units three times daily subcutaneously before meals as per sliding scale as directed    Dispense:  15 mL    Refill:  3  . omeprazole (PRILOSEC) 40 MG capsule    Sig: Take 1 capsule (40 mg total) by mouth daily.    Dispense:  30 capsule    Refill:  3  . FLUoxetine (PROZAC) 20 MG tablet    Sig: Take 1 tablet (20 mg total) by mouth daily.    Dispense:  30 tablet    Refill:  3  . atorvastatin (LIPITOR) 40 MG tablet    Sig: Take 1 tablet (40 mg total) by mouth daily.    Dispense:  30 tablet    Refill:  5  . gabapentin (NEURONTIN) 300 MG capsule    Sig: Take 2 capsules (600 mg total) by mouth at bedtime.    Dispense:  60 capsule    Refill:  3    Decrease previous dose  . mirtazapine (REMERON) 30 MG tablet    Sig: Take 1 tablet (30 mg total) by mouth at bedtime.     Dispense:  30 tablet    Refill:  5    Discontinue previous dose  . insulin aspart (novoLOG) injection 10 Units    Follow-up: Return in about 6 weeks (around 02/07/2018) for Follow-up dyspepsia and anxiety.   Hoy Register MD

## 2017-12-27 NOTE — Telephone Encounter (Signed)
Met with the patient and his sister, Raymond Winters when he was in the clinic today for his appointment.  He stated that he is not receiving any PCS hours and has never received their services.  Call placed to [facility], spoke to Claudine who stated that they have never received a referral for PCS.   Dr Margarita Rana in agreement of the need for [facility] to assess the patient for PCS.   Also provided the patient's sister with the # for CAP program if she is interested in exploring another options for supportive care. She is not interested in PACE program.

## 2017-12-28 ENCOUNTER — Telehealth: Payer: Self-pay

## 2017-12-28 DIAGNOSIS — Z794 Long term (current) use of insulin: Secondary | ICD-10-CM

## 2017-12-28 DIAGNOSIS — E118 Type 2 diabetes mellitus with unspecified complications: Secondary | ICD-10-CM

## 2017-12-28 LAB — GLUCOSE, POCT (MANUAL RESULT ENTRY): POC Glucose: 489 mg/dl — AB (ref 70–99)

## 2017-12-28 MED ORDER — SERTRALINE HCL 50 MG PO TABS: 50.0000 mg | ORAL_TABLET | Freq: Every day | ORAL | 3 refills | Status: AC

## 2017-12-28 NOTE — Telephone Encounter (Signed)
Prozac changed to Zoloft.

## 2017-12-28 NOTE — Telephone Encounter (Signed)
The fluoxetine sent yesterday is not covered by ins, they prefer citalopram or sertraline

## 2017-12-29 ENCOUNTER — Other Ambulatory Visit: Payer: Self-pay

## 2017-12-29 ENCOUNTER — Telehealth: Payer: Self-pay | Admitting: Emergency Medicine

## 2017-12-29 ENCOUNTER — Telehealth: Payer: Self-pay

## 2017-12-29 DIAGNOSIS — E118 Type 2 diabetes mellitus with unspecified complications: Secondary | ICD-10-CM

## 2017-12-29 DIAGNOSIS — Z794 Long term (current) use of insulin: Principal | ICD-10-CM

## 2017-12-29 MED ORDER — INSULIN GLARGINE 100 UNIT/ML SOLOSTAR PEN: 12.0000 [IU] | PEN_INJECTOR | Freq: Two times a day (BID) | SUBCUTANEOUS | 0 refills | Status: DC

## 2017-12-29 NOTE — Telephone Encounter (Signed)
Pt called and wants to know if he can get a refill on his Insulin Glargine (LANTUS SOLOSTAR) 100 UNIT/ML Solostar Pen and NOVOLOG FLEXPEN 100 UNIT/ML FlexPen. [facility]acy [facility] [address]. Thanks.

## 2017-12-29 NOTE — Telephone Encounter (Signed)
Rx sent 

## 2017-12-29 NOTE — Telephone Encounter (Signed)
Completed PCS referral faxed to [facility] °

## 2017-12-30 ENCOUNTER — Other Ambulatory Visit: Payer: Self-pay

## 2017-12-30 MED ORDER — BASAGLAR KWIKPEN 100 UNIT/ML ~~LOC~~ SOPN: 12.0000 [IU] | PEN_INJECTOR | Freq: Two times a day (BID) | SUBCUTANEOUS | 3 refills | Status: DC

## 2017-12-30 MED ORDER — BASAGLAR KWIKPEN 100 UNIT/ML ~~LOC~~ SOPN: 12.0000 [IU] | PEN_INJECTOR | Freq: Two times a day (BID) | SUBCUTANEOUS | 3 refills | Status: AC

## 2017-12-31 ENCOUNTER — Telehealth: Payer: Self-pay

## 2017-12-31 MED FILL — BASAGLAR 100 UNIT/ML KWIKPE: 100 | 12 days supply | Qty: 3 | Fill #0

## 2017-12-31 NOTE — Telephone Encounter (Signed)
[facility] and stated that they are ready to schedule the assessment.

## 2018-01-03 ENCOUNTER — Other Ambulatory Visit: Payer: Medicare Other

## 2018-01-05 ENCOUNTER — Ambulatory Visit (INDEPENDENT_AMBULATORY_CARE_PROVIDER_SITE_OTHER): Payer: Medicare Other | Admitting: Endocrinology

## 2018-01-05 ENCOUNTER — Encounter: Payer: Self-pay | Admitting: Endocrinology

## 2018-01-05 VITALS — BP 122/72 | HR 63 | Ht 67.0 in | Wt 92.4 lb

## 2018-01-05 DIAGNOSIS — E1065 Type 1 diabetes mellitus with hyperglycemia: Secondary | ICD-10-CM | POA: Diagnosis not present

## 2018-01-05 NOTE — Patient Instructions (Signed)
Check sugar before meals and bedtime  Take 1st lantus dose on waking up

## 2018-01-05 NOTE — Progress Notes (Signed)
Patient ID: Raymond Winters, male   DOB: 22-Sep-1960, 57 y.o.   MRN: [id number]          Reason for Appointment: follow-up for Type 2 Diabetes  History of Present Illness:          Date of diagnosis of type 2 diabetes mellitus:  1994      Background history:   He thinks he was tried on metformin at the time of diagnosis.  He does not think his weight was excessive at the time of diagnosis He was probably started on insulin within a few years of his diagnosis because of poor control His blood sugar control has always been poor with A1c History indicating high levels except for twice when his A1c was below 8% He has been on various insulin regimens in the past Earlier this year when he was incarcerated he was taking 70/30 insulin and with this he was getting periodic hypoglycemia After his ER visit he was changed to Lantus and NovoLog  Recent history:   INSULIN regimen is:  15 Lantus bid, NovoLog 5-10 units as needed  His A1c has been consistently over 13 previously but last one was 11.9 done in 10/2017   Current management, blood sugar patterns and problems identified:  On his last visit he was asked to start checking his blood sugars consistently so that his insulin dose regimen could be established and adjusted based on blood sugar pattern  However he has checked blood sugars only yesterday and today before his first meal and these are significantly high  He thinks he takes his Lantus only about 4 days a week  His family thinks that he is very reluctant to take care of his diabetes and does not cooperate with them for all measures of self-care and taking insulin  He thinks he is taking 5 to 10 units of insulin/NovoLog with each meal and today took 7 units before his first meal  No recent hypoglycemia  His blood sugars were around 500 when seen by PCP recently  Also in 5/19 he was admitted for hyperglycemia  He has been referred to see the dietitian and nurse educator but they have  been unable to contact him for appointment        Side effects from medications have been: None  Compliance with the medical regimen: Inconsistent Hypoglycemia: None recently  Glucose monitoring:  As above   Self-care: The diet that the patient has been following is: tries to limit sweets and sweet drinks .       Typical meal intake: Breakfast is late morning, eggs/toast or cereal; dinner: fried chicken sometimes,  snacks at times               Dietician visit, most recent: never               Exercise:  minimal because of fatigue  Weight history:  Wt Readings from Last 3 Encounters:  01/05/18 92 lb 6 oz (41.9 kg)  12/27/17 91 lb (41.3 kg)  11/12/17 111 lb 12.4 oz (50.7 kg)    Glycemic control:   Lab Results  Component Value Date   HGBA1C 11.9 (H) 11/11/2017   HGBA1C 13.6 09/14/2017   HGBA1C 13.2 05/26/2017   Lab Results  Component Value Date   MICROALBUR <0.7 05/06/2017   LDLCALC 44 07/02/2017   CREATININE 1.43 (H) 11/11/2017   Lab Results  Component Value Date   MICRALBCREAT 1.3 05/06/2017    Lab Results  Component Value  Date   FRUCTOSAMINE 413 (H) 07/02/2017   FRUCTOSAMINE 391 (H) 06/04/2017      Allergies as of 01/05/2018      Reactions   Ativan [lorazepam] Other (See Comments)   HALLUCINATIONS      Medication List        Accurate as of 01/05/18  3:24 PM. Always use your most recent med list.          aspirin EC 81 MG tablet Take 1 tablet (81 mg total) by mouth daily.   atorvastatin 40 MG tablet Commonly known as:  LIPITOR Take 1 tablet (40 mg total) by mouth daily.   BASAGLAR KWIKPEN 100 UNIT/ML Sopn Inject 0.12 mLs (12 Units total) into the skin 2 (two) times daily.   collagenase ointment Commonly known as:  SANTYL Apply topically daily. Apply Santyl to right forearm wound bed. Cover with saline moistened gauze, then dry gauze or ABD pad.  Change daily.   ferrous sulfate 325 (65 FE) MG tablet Take 1 tablet (325 mg total) by mouth  2 (two) times daily with a meal.   gabapentin 300 MG capsule Commonly known as:  NEURONTIN Take 2 capsules (600 mg total) by mouth at bedtime.   Lancets Misc 1 each by Does not apply route daily. Use as instructed to check blood sugar   mirtazapine 30 MG tablet Commonly known as:  REMERON Take 1 tablet (30 mg total) by mouth at bedtime.   NOVOLOG FLEXPEN 100 UNIT/ML FlexPen Generic drug:  insulin aspart Inject 0-12 units three times daily subcutaneously before meals as per sliding scale as directed   omeprazole 40 MG capsule Commonly known as:  PRILOSEC Take 1 capsule (40 mg total) by mouth daily.   ONETOUCH VERIO test strip Generic drug:  glucose blood USE AS DIRECTED THREE TIMES DAILY BEFORE MEALS   Pancrelipase (Lip-Prot-Amyl) 25000 units Cpep Commonly known as:  ZENPEP Take 25,000 Units by mouth 3 (three) times daily before meals.   sertraline 50 MG tablet Commonly known as:  ZOLOFT Take 1 tablet (50 mg total) by mouth daily.       Allergies:  Allergies  Allergen Reactions  . Ativan [Lorazepam] Other (See Comments)    HALLUCINATIONS    Past Medical History:  Diagnosis Date  . Alcohol dependence (HCC) 01/05/2016  . Alcoholic liver disease, unspecified (HCC) 01/05/2016  . Arthritis    "lower extremeties, hands, shoulders, back" (08/20/2016)  . Chronic alcoholic pancreatitis (HCC) 01/05/2016  . CKD (chronic kidney disease), stage III (HCC)   . Depression   . Family history of adverse reaction to anesthesia    pt's sister has hx. of post-op N/V  . Hepatitis C    "never treated" (08/20/2016)  . Heroin abuse (HCC)   . High cholesterol   . Hypoalbuminemia due to protein-calorie malnutrition (HCC) 01/05/2016  . Insulin dependent diabetes mellitus (HCC)   . Lactic acid increased, CHRONIC 01/05/2016   Secondary to Liver Disease, Alcoholism, and Diabetes  . 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 Shoulder wound 01/2016   left    Past  Surgical History:  Procedure Laterality Date  . APPLICATION OF WOUND VAC Left 12/24/2015   Procedure: APPLICATION OF WOUND VAC; LEFT SHOULDER;  Surgeon: Tarry Kos, MD;  Location: [facility] OR;  Service: Orthopedics;  Laterality: Left;  . APPLICATION OF WOUND VAC Left 01/29/2016   Procedure: LEFT SHOULDER SPLIT THICKNESS SKIN GRAFT, WOUND VAC;  Surgeon: Tarry Kos, MD;  Location:  [facility];  Service: Orthopedics;  Laterality: Left;  . I&D EXTREMITY Right 03/20/2013   Procedure: IRRIGATION AND DEBRIDEMENT EXTREMITY;  Surgeon: Marlowe ShoresMatthew A Weingold, MD;  Location: [facility] OR;  Service: Orthopedics;  Laterality: Right;  . I&D EXTREMITY Left 12/24/2015   Procedure:  DEBRIDEMENT OF MUSCLE, SUBCUTANEOUS TISSUE LEFT SHOULDER;  Surgeon: Tarry KosNaiping M Xu, MD;  Location: [facility] OR;  Service: Orthopedics;  Laterality: Left;  . I&D EXTREMITY Right 12/18/2016   Procedure: INCISION AND DRAINAGE ANKLE ABSCESS;  Surgeon: Sheral ApleyMurphy, Timothy D, MD;  [facility];  Service: Orthopedics;  Laterality: Right;  . INCISION AND DRAINAGE ABSCESS Right 08/19/2016   Procedure: DEBRIDEMENT RIGHT BUTTOCK  ABSCESS;  Surgeon: Manus RuddMatthew Tsuei, MD;  [facility];  Service: General;  Laterality: Right;  . IRRIGATION AND DEBRIDEMENT ABSCESS Right 08/19/2016   buttocks  . IRRIGATION AND DEBRIDEMENT SHOULDER Left 12/27/2015   Procedure: IRRIGATION AND DEBRIDEMENT LEFT SHOULDER;  Surgeon: Tarry KosNaiping M Xu, MD;  Location: [facility];  Service: Orthopedics;  Laterality: Left;  . IRRIGATION AND DEBRIDEMENT SHOULDER Left 12/30/2015   Procedure: IRRIGATION AND DEBRIDEMENT LEFT SHOULDER;  Surgeon: Tarry KosNaiping M Xu, MD;  Location: [facility];  Service: Orthopedics;  Laterality: Left;  . SKIN SPLIT GRAFT Left 01/29/2016   Procedure: SKIN GRAFT SPLIT THICKNESS,;  Surgeon: Tarry KosNaiping M Xu, MD;  Location: [facility];  Service: Orthopedics;  Laterality: Left;    Family History  Problem Relation Age of Onset  . Diabetes Mother   . Heart disease Mother   .  Hypertension Mother   . Stroke Mother   . Vision loss Mother   . Heart disease Father   . Hypertension Father   . Anesthesia problems Sister        post-op N/V  . Diabetes Sister     Social History:  reports that he has been smoking cigarettes.  He has a 18.50 pack-year smoking history. He has never used smokeless tobacco. He reports that he has current or past drug history. Drugs: IV and Heroin. He reports that he does not drink alcohol.   Review of Systems   Lipid history: No history of hyperlipidemia    Lab Results  Component Value Date   CHOL 90 07/02/2017   HDL 30.10 (L) 07/02/2017   LDLCALC 44 07/02/2017   TRIG 80.0 07/02/2017   CHOLHDL 3 07/02/2017           He has had some difficulty swallowing, recently a little better, has discussed with PCP   Most recent foot exam: 11/18   Physical Examination:  BP 122/72   Pulse 63   Ht 5\' 7"  (1.702 m)   Wt 92 lb 6 oz (41.9 kg)   BMI 14.47 kg/m    ASSESSMENT:  Diabetes type 2, nonobese and persistently uncontrolled  He is insulin-dependent, likely has nsulin deficiency related to chronic pancreatitis  See history of present illness for detailed discussion of current diabetes management, blood sugar patterns and problems identified   His blood sugars are poorly controlled because of his not taking his insulin as directed, both basal and bolus insulins Most of his blood sugars appear to be high consistently including when seen by PCP earlier this month Although he is not symptomatic with hyperglycemia at this time his home blood sugars are occasionally over 300 fasting from his not taking Lantus consistently He also is not understanding that he needs to take NovoLog with every meal regardless of whether he checks his blood sugar or  not Does need much more diabetes education and better meal planning   PLAN:     Start checking blood sugars consistently before each meal and at bedtime  Discussed blood sugar  targets  He will take his first dose of LANTUS when he wakes up regardless of testing his sugar  Appointment made for him to follow-up with nurse educator in 2 weeks  He can continue the same dose of NovoLog but this need to be reassessed once he has better blood sugar monitoring     Patient Instructions  Check sugar before meals and bedtime  Take 1st lantus dose on waking up      Reather Littler 01/05/2018, 3:24 PM   Note: This office note was prepared with Dragon voice recognition system technology. Any transcriptional errors that result from this process are unintentional.

## 2018-01-06 ENCOUNTER — Encounter: Payer: Self-pay | Admitting: Internal Medicine

## 2018-01-06 DIAGNOSIS — R404 Transient alteration of awareness: Secondary | ICD-10-CM | POA: Diagnosis not present

## 2018-01-06 DIAGNOSIS — I469 Cardiac arrest, cause unspecified: Secondary | ICD-10-CM | POA: Diagnosis not present

## 2018-01-06 NOTE — Progress Notes (Signed)
Received incoming call from paramedic Berton MountAaron Dean.  Patient was found dead lying on his couch today by family members.  Family members were present at the scene.  They reports that patient has a history of noncompliance with his medications.  He was recently seen by PCP Dr. Alvis LemmingsNewlin and also by the endocrinologist Dr. Lucianne MussKumar.  Family does not want an autopsy done.  I gave the okay that Dr. Alvis LemmingsNewlin will sign off on his death certificate

## 2018-01-20 DIAGNOSIS — 419620001 Death: Secondary | SNOMED CT | POA: Diagnosis not present

## 2018-01-20 DEATH — deceased

## 2018-01-27 ENCOUNTER — Telehealth: Payer: Self-pay

## 2018-01-27 NOTE — Telephone Encounter (Signed)
Ms. Raymond Winters was called and informed that funeral home was contacted and ask to overnight a copy of the current death certificate.

## 2018-02-03 ENCOUNTER — Telehealth: Payer: Self-pay | Admitting: Endocrinology

## 2018-02-03 NOTE — Telephone Encounter (Signed)
FYI

## 2018-02-03 NOTE — Telephone Encounter (Signed)
Nutrition and diabetes eduction upstairs called and stated that patient is deceased, they received a call this morning from the family. They mentioned that the patients family who called mentioned that they had already called our office about this but still received reminder for appointment. I do not see any documentation where someone called/   Wanted Dr Lucianne MussKumar to be aware.

## 2018-02-08 ENCOUNTER — Telehealth: Payer: Self-pay | Admitting: Family Medicine

## 2018-02-08 ENCOUNTER — Encounter: Payer: Medicare Other | Admitting: Nutrition

## 2018-02-16 ENCOUNTER — Ambulatory Visit: Payer: Medicare Other | Admitting: Endocrinology

## 2018-11-15 IMAGING — US US EXTREM UP*L* LTD
1 series · 14 of 18 positions shown · non-contrast
Comparison: CT left arm for [REDACTED]

CLINICAL DATA: Left arm abscess

EXAM:
ULTRASOUND left UPPER EXTREMITY LIMITED
TECHNIQUE: Ultrasound examination of the upper extremity soft tissues was
performed in the area of clinical concern.

[Series 1: us extrem up*left* ltd · 0.06mm/px · 14 of 18 slices shown]
[im 1/18]
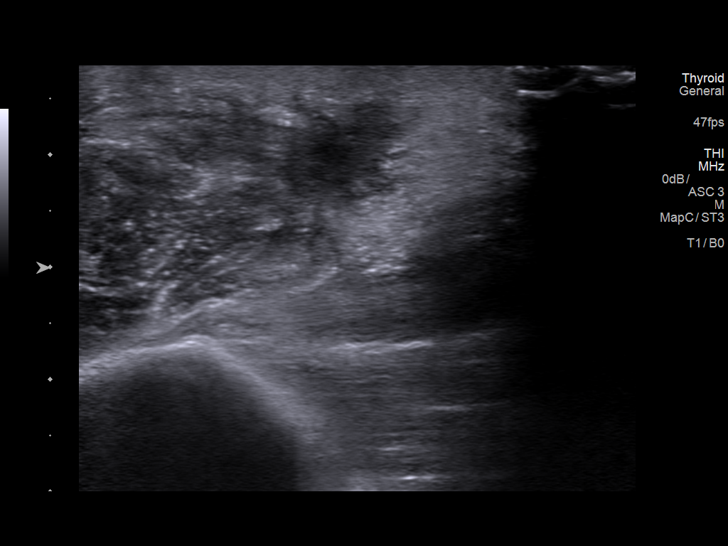
[im 2/18]
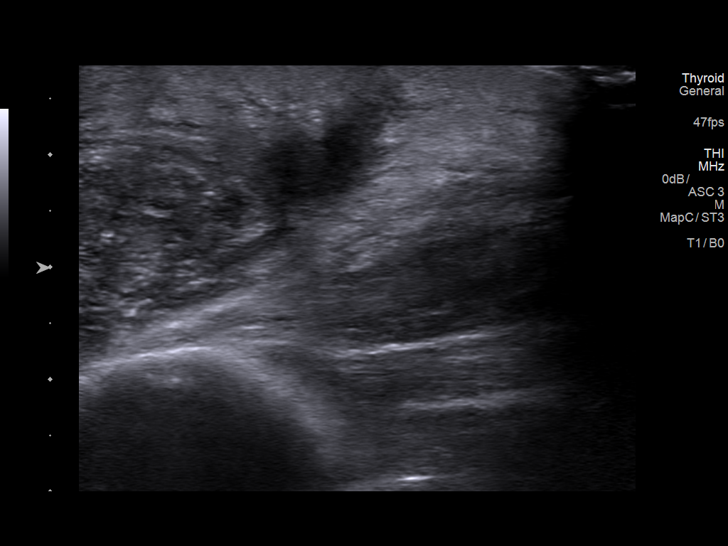
[im 4/18]
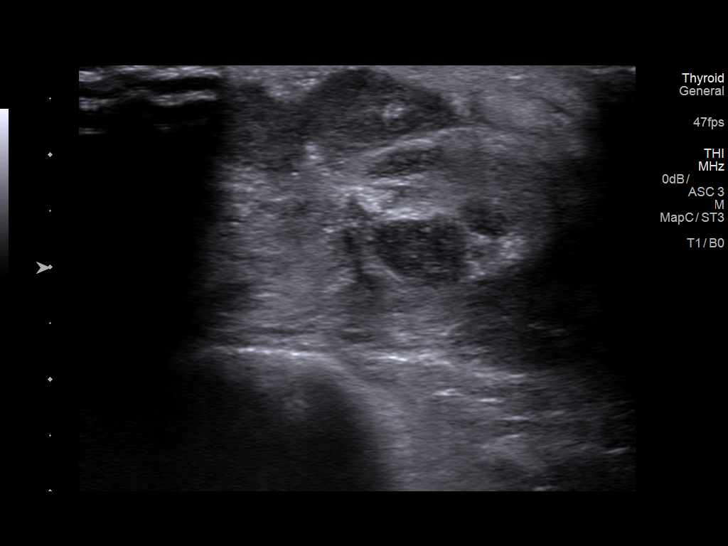
[im 5/18]
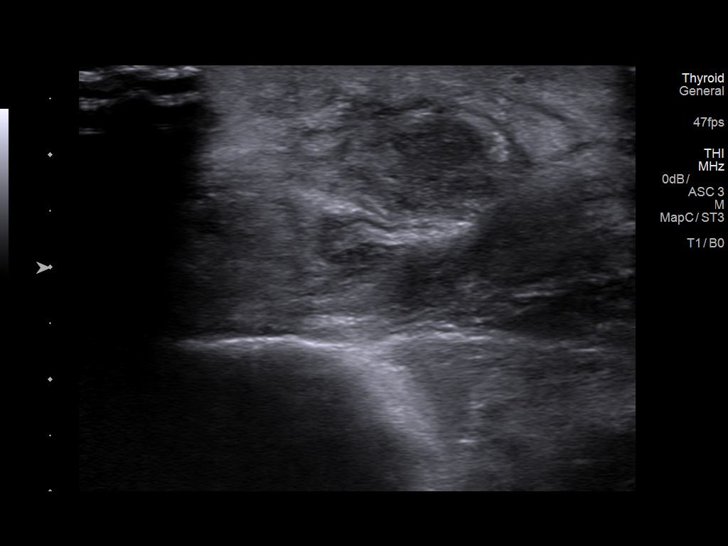
[im 6/18]
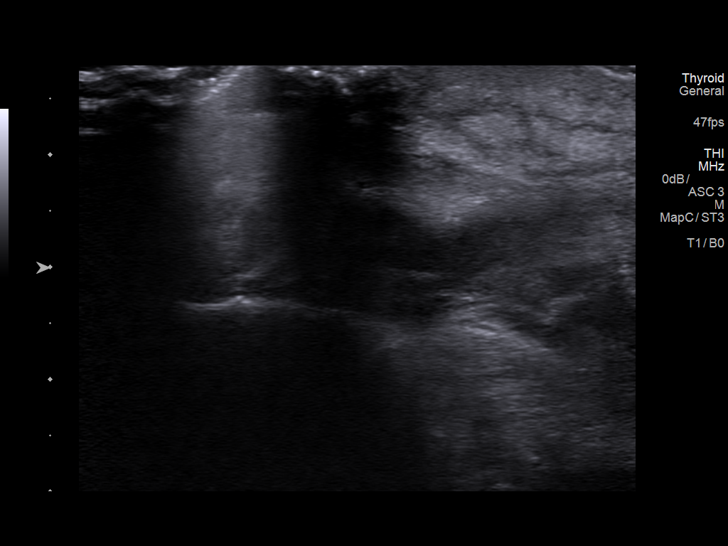
[im 8/18]
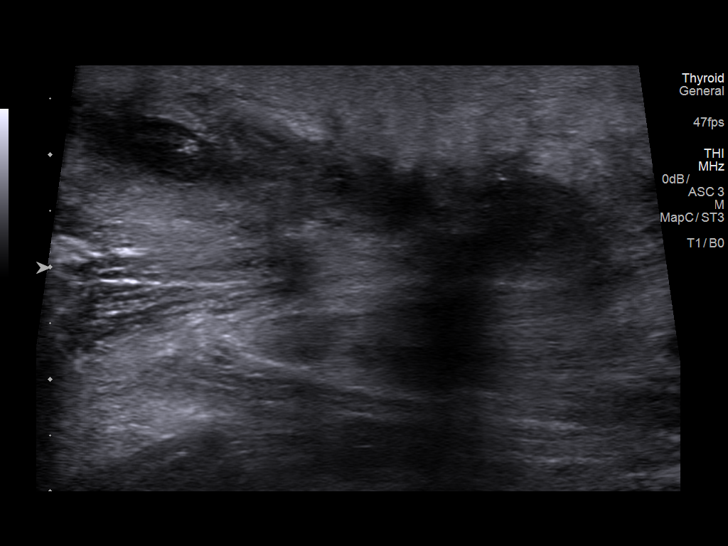
[im 9/18]
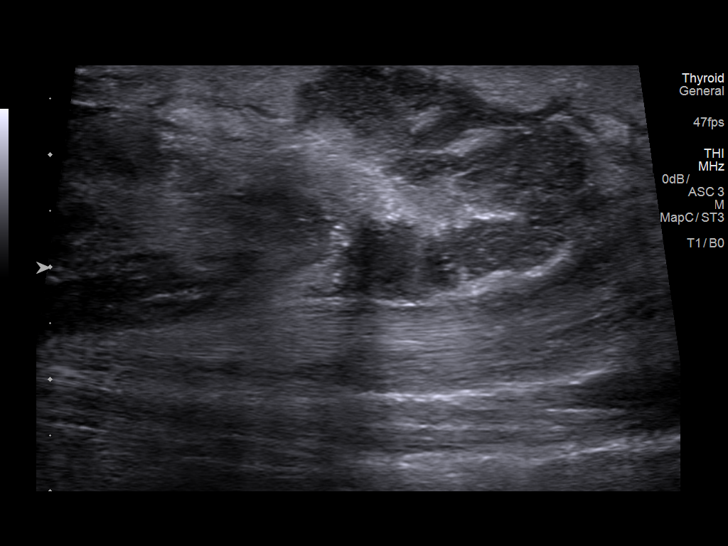
[im 10/18]
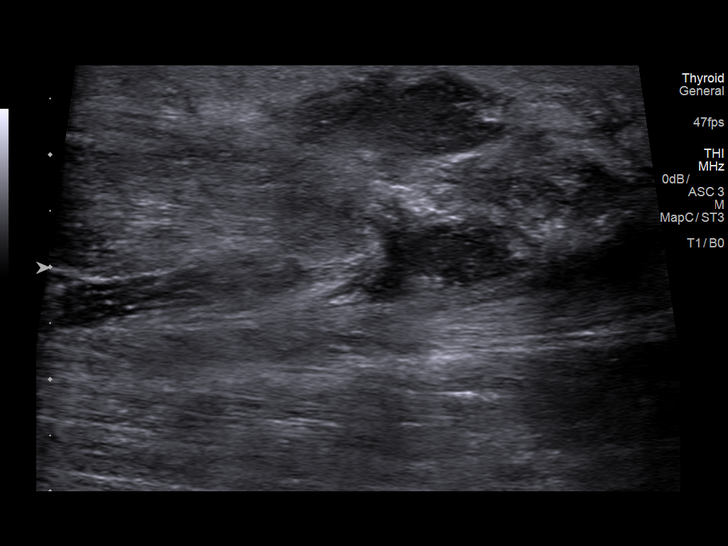
[im 11/18]
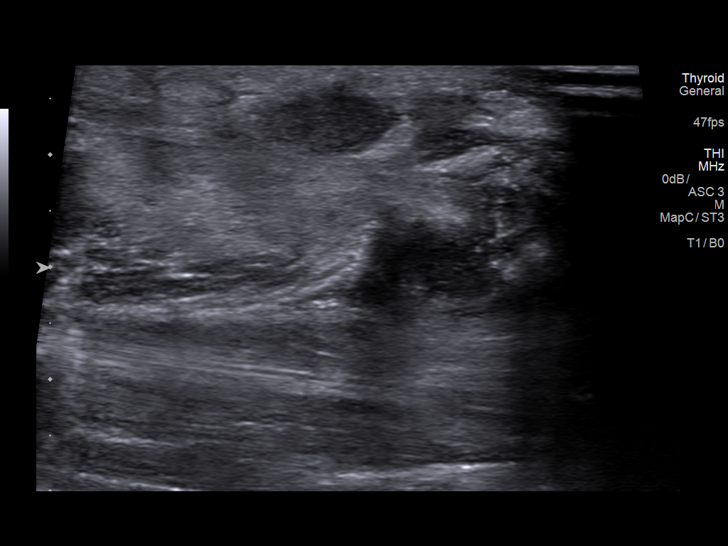
[im 13/18]
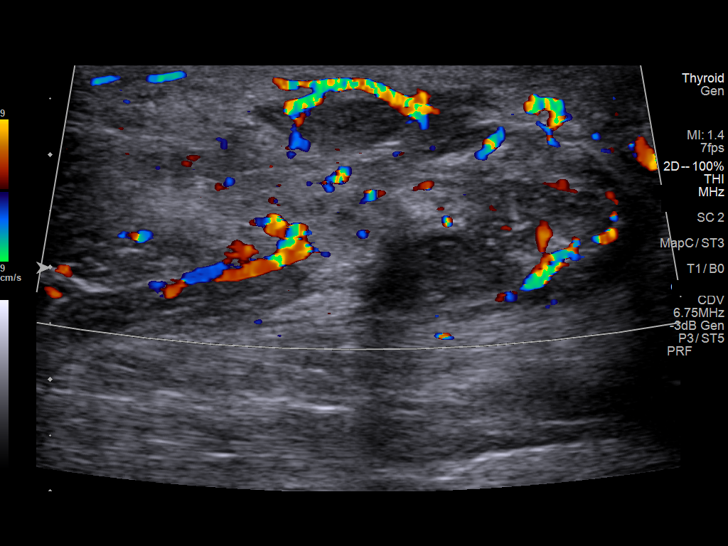
[im 14/18]
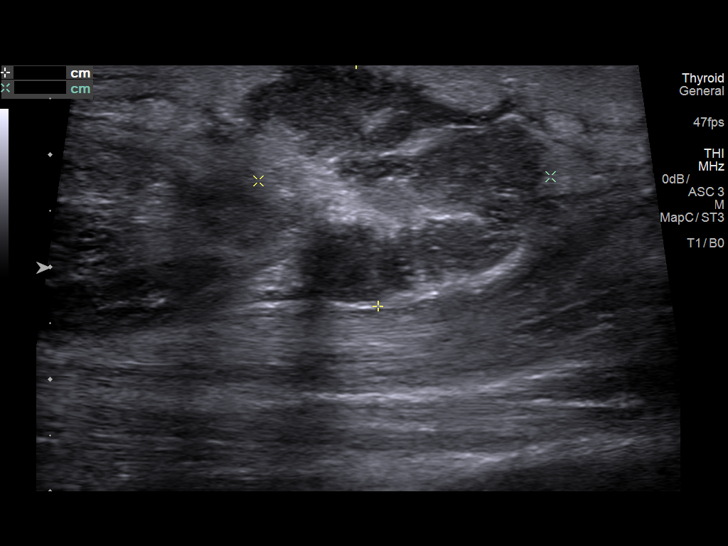
[im 15/18]
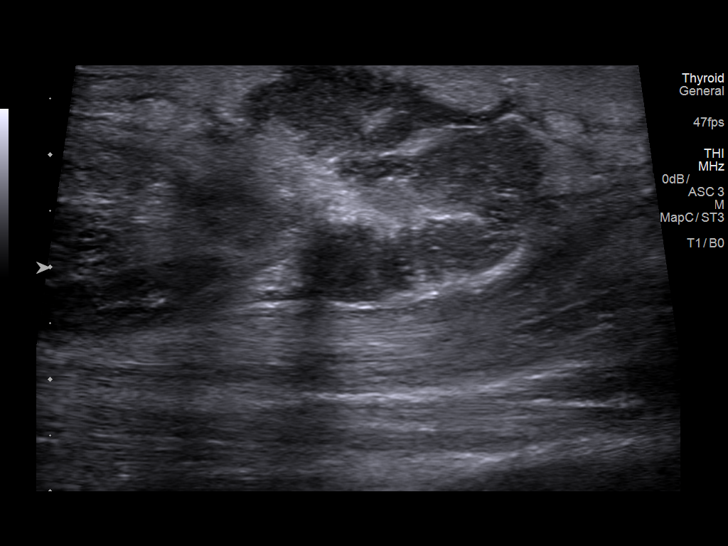
[im 17/18]
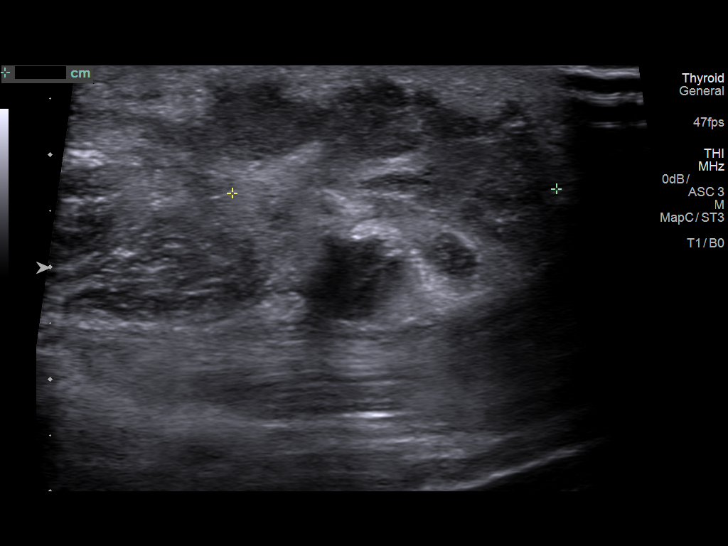
[im 18/18]
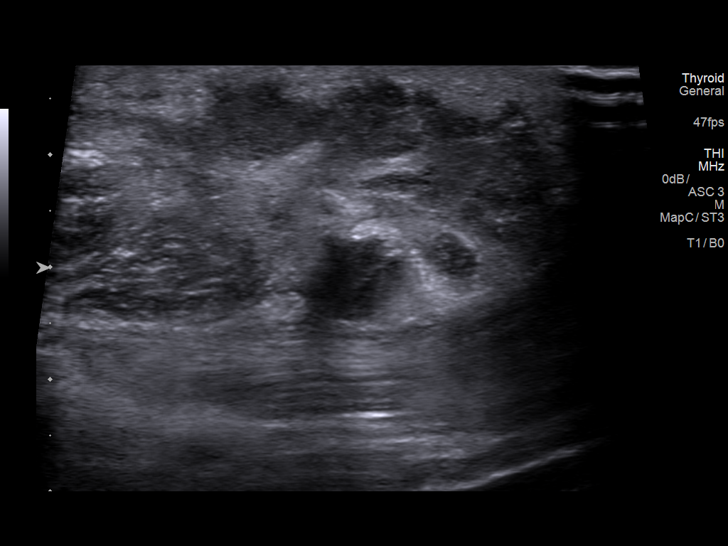

[14 of 18 positions shown; findings below may reference images not displayed]

FINDINGS: Hypoechoic structure in the left upper arm laterally measures 2.2 x
2.6 cm. Surrounding tissues are hyperechoic on Doppler. This may be
a complex fluid collection.
IMPRESSION: 2.2 x 2.6 cm hypoechoic structure in the muscles of the left upper
arm most likely a complex fluid collection. This may represent
abscess particularly based on the CT scan were rim enhancement was
noted.

## 2019-01-11 IMAGING — CR DG HIP (WITH OR WITHOUT PELVIS) 2-3V*R*
3 series · 3 of 3 positions shown · non-contrast
Comparison: CT abdomen and pelvis with bony reformats September 15, 2015

CLINICAL DATA: Pain and swelling following fall 1 week prior

EXAM:
DG HIP (WITH OR WITHOUT PELVIS) 2-3V RIGHT

[pelvis ap]
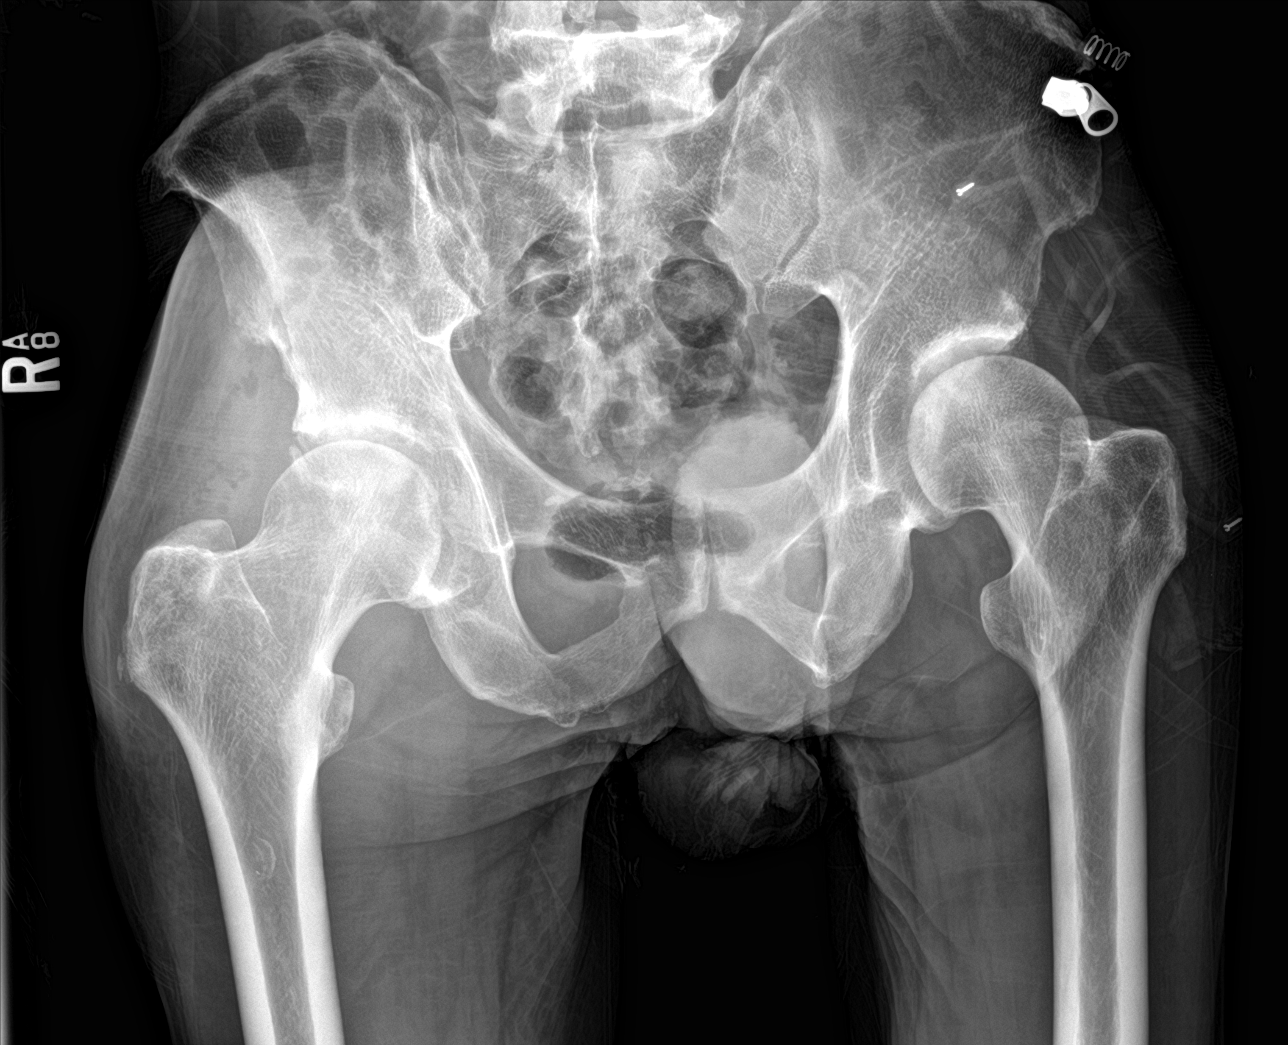

[hip ap]
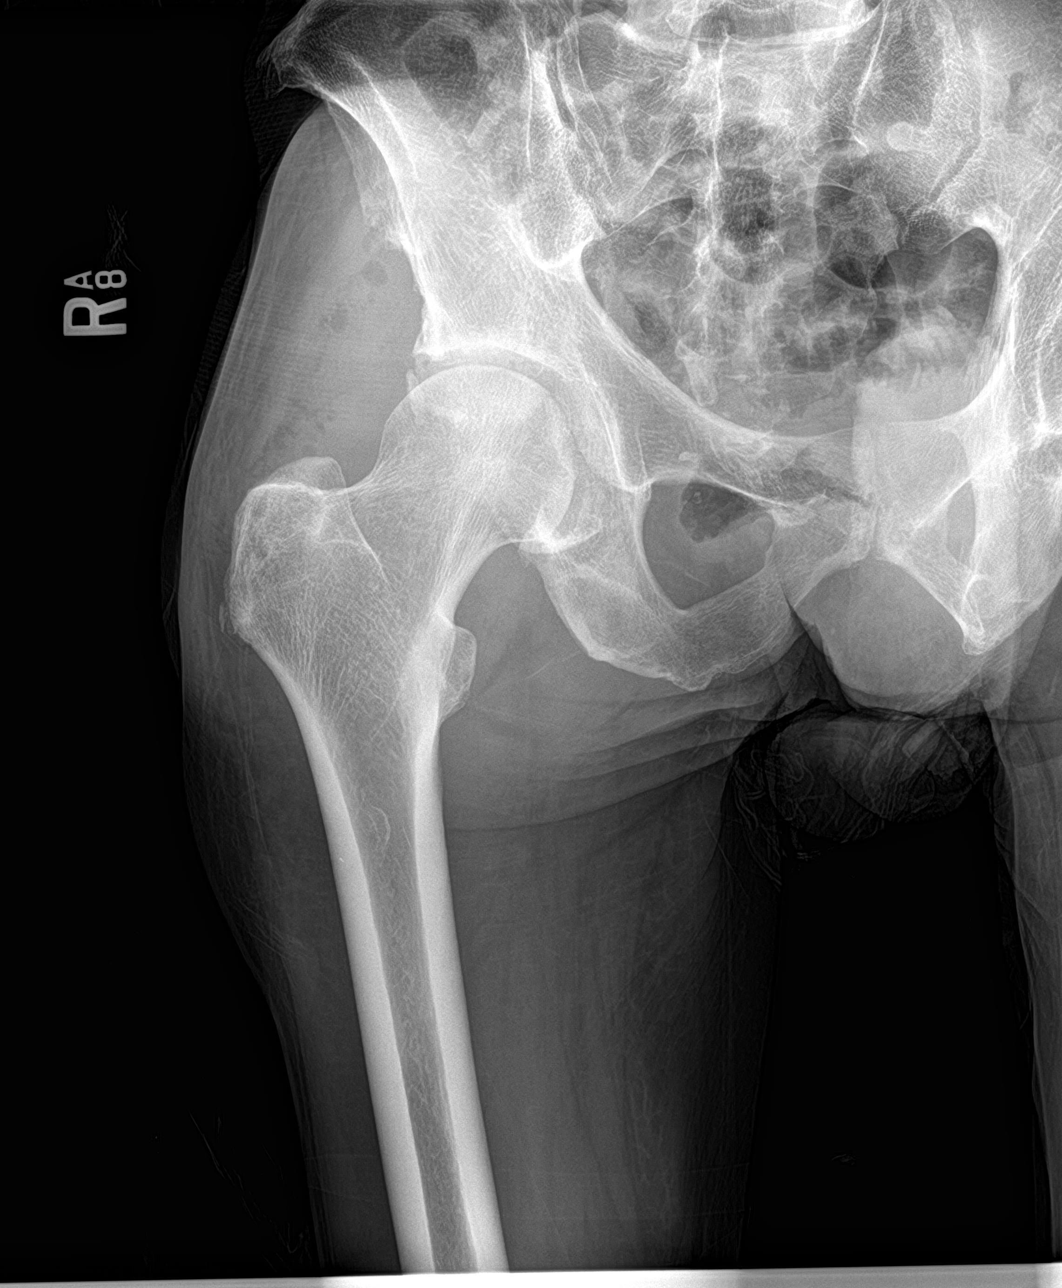

[hip lat]
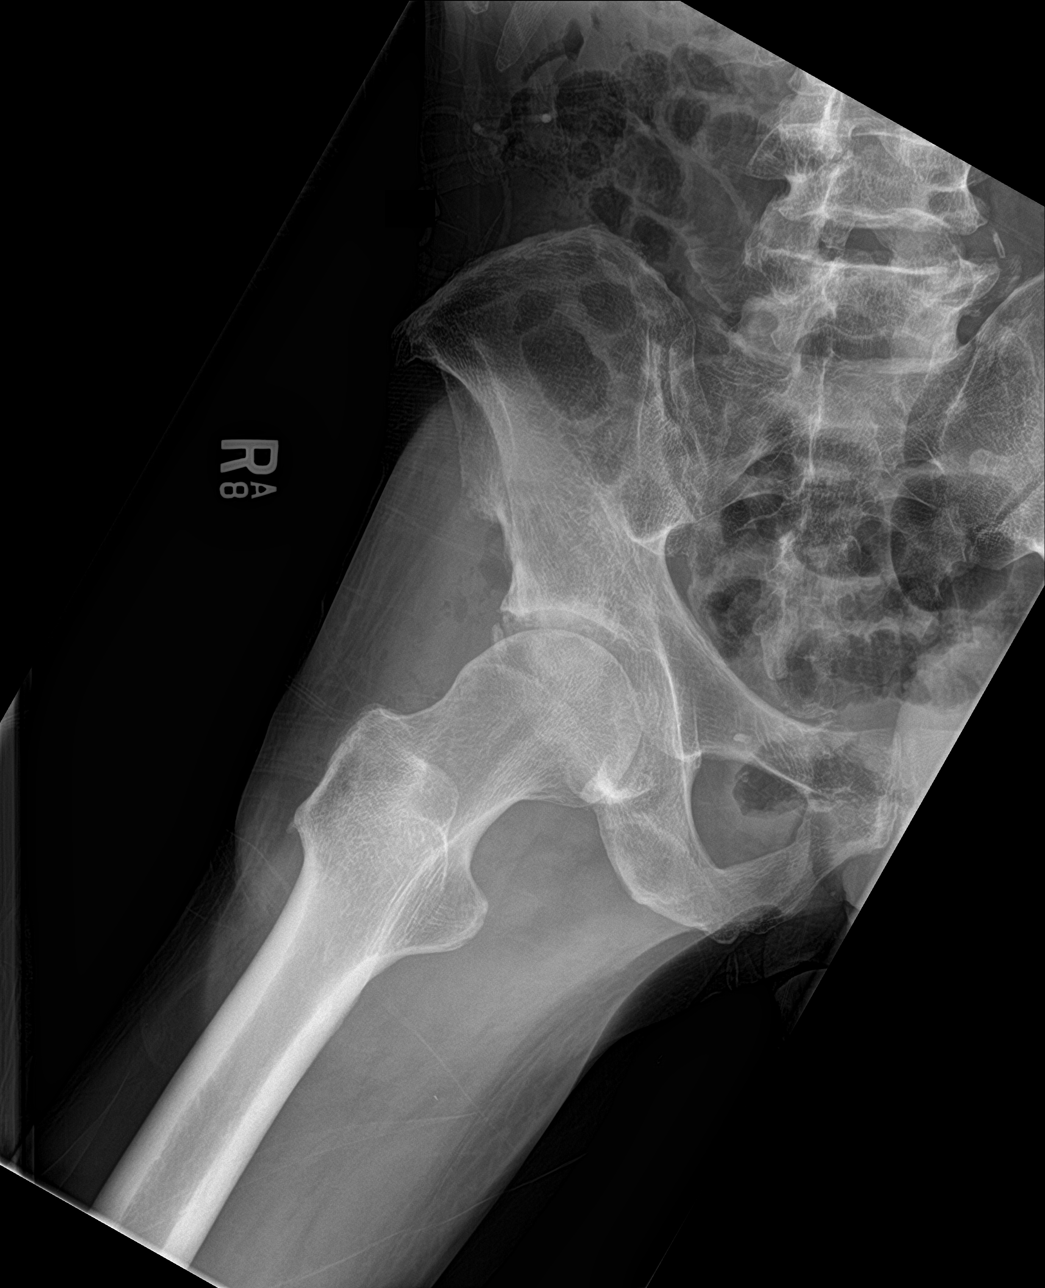

[3 of 3 positions shown; findings below may reference images not displayed]

FINDINGS: Frontal pelvis as well as frontal and lateral right hip images were
obtained. There is no acute fracture or dislocation. Calcification
lateral to the right acetabulum was present previously and may have
arthropathic etiology or the result of old trauma in this area.

There is soft tissue fullness lateral to the right hip joint with
foci of air in this area. There is subcutaneous edema in this area
lateral to the right hip joint and inferior right iliac crest. There
is subtle cortical loss along the right inferolateral iliac crest.
The appearance in this area is concerning for soft tissue abscess
causing localized bony destruction along the inferolateral right
iliac crest. No other bony destruction evident.
IMPRESSION: Findings felt to represent soft tissue abscess lateral to the right
hip joint and inferior right lateral iliac crest with apparent
localized bony destruction along the inferolateral right iliac
crest. This finding warrants CT of the pelvis, ideally with
intravenous contrast, to further assess. It is difficult to exclude
infection/inflammation extending into the right hip joint, although
there is no widening of the right hip joint.

No acute fracture or dislocation.

These results were called by telephone at the time of interpretation
on 08/19/2016 at [DATE] to Dr. INTICK TURANGAN , who verbally
acknowledged these results.

## 2019-01-11 IMAGING — MR MR HIP*R* WO/W CM
8 of 19 series · 19 of 40 positions shown · IV contrast (multihance)
Comparison: Radiographs 08/19/2016

CLINICAL DATA: Right hip and buttock pain, redness and swelling for
1 week. Prior history of fall.

EXAM:
MRI OF THE RIGHT FEMUR WITHOUT AND WITH CONTRAST; MRI OF THE RIGHT
HIP WITHOUT AND WITH CONTRAST
TECHNIQUE: Multiplanar, multisequence MR imaging of the right hip and femur.
Was performed both before and after administration of intravenous
contrast.
CONTRAST:  12mL MULTIHANCE GADOBENATE DIMEGLUMINE 529 MG/ML IV SOLN

[Series 2: T1 · coronal · 3.5mm · 0.66mm/px · 3 of 35 slices shown (1 of 3)]
[im 1/35]
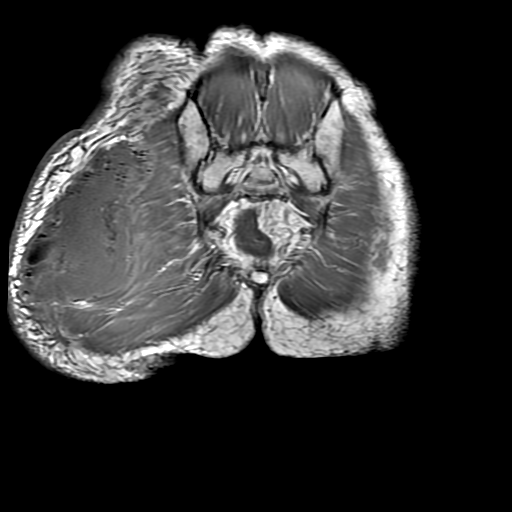
[im 18/35]
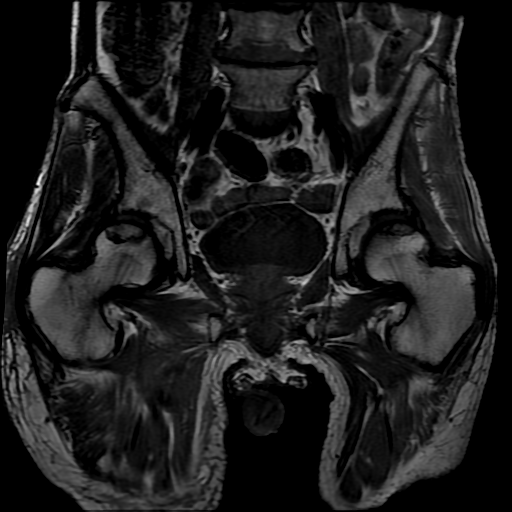
[im 35/35]
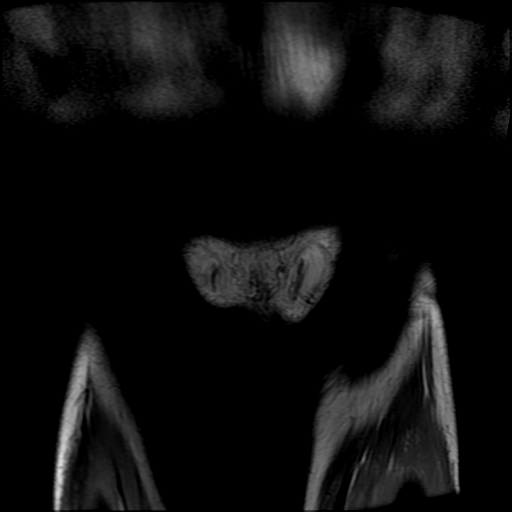

[Series 5: T1 · axial · 3.5mm · 0.70mm/px · z∈[-113,+35]mm · 2 of 38 slices shown (2 of 3)]
[im 1/38]
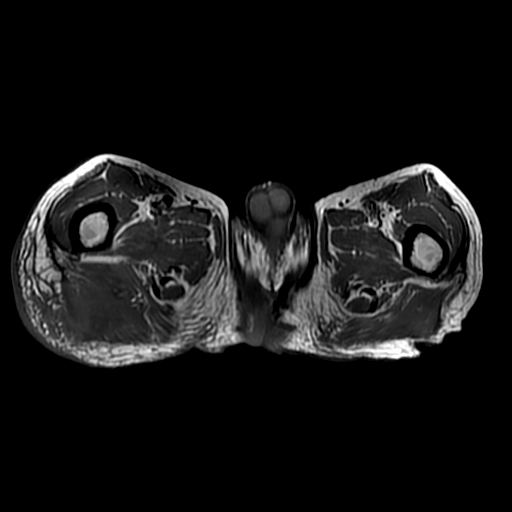
[im 38/38]
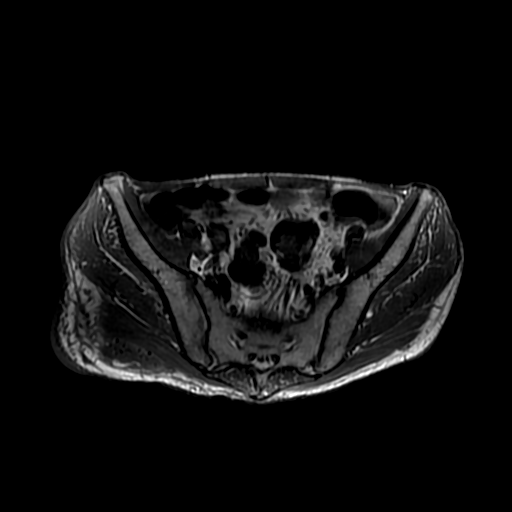

[Series 7: T2 fat-sat · axial · 3.5mm · 0.70mm/px · z∈[-113,+35]mm · 2 of 38 slices shown (1 of 2)]
[im 1/38]
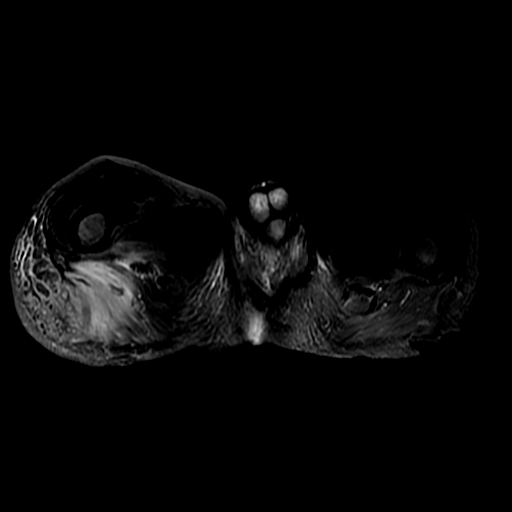
[im 38/38]
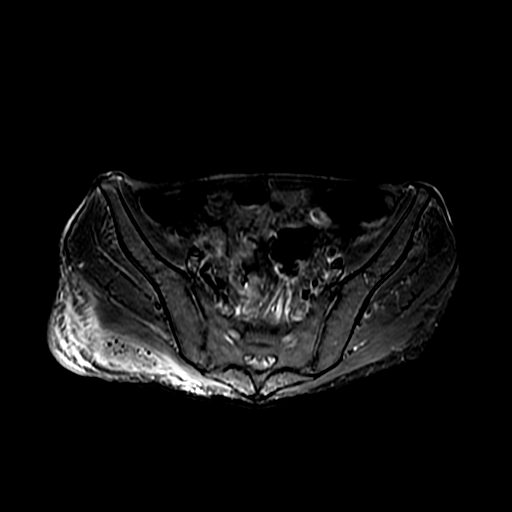

[Series 8: T1 fat-sat · axial · 3.5mm · 0.70mm/px · z∈[-113,+35]mm · 2 of 38 slices shown (1 of 2)]
[im 1/38]
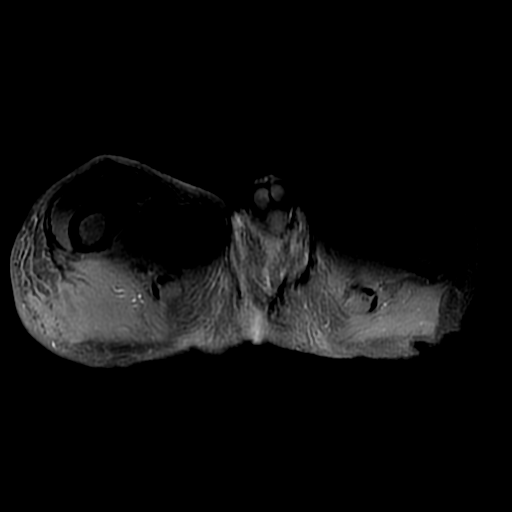
[im 38/38]
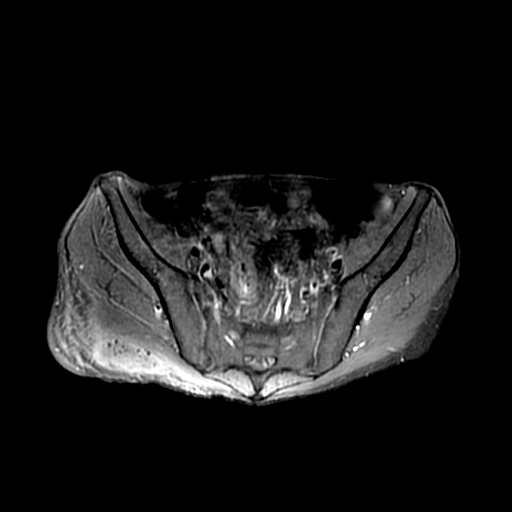

[Series 9: PD fat-sat · sagittal · 4.0mm · 0.27mm/px · 2 of 28 slices shown]
[im 1/28]
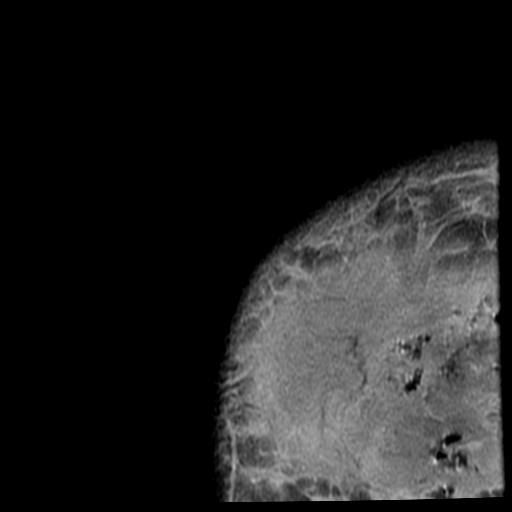
[im 28/28]
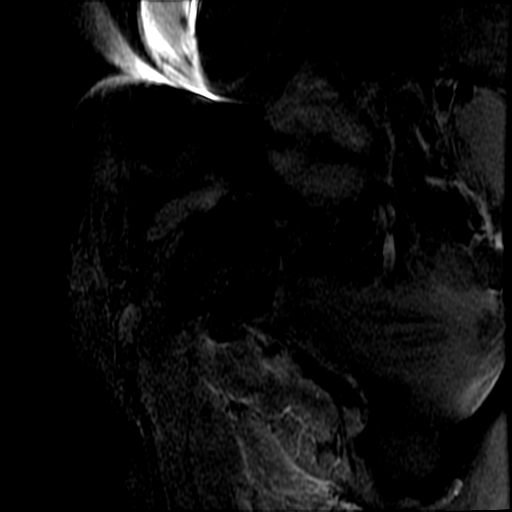

[Series 12: T1 · axial · 5.0mm · 0.88mm/px · z∈[-334,+42]mm · 3 of 59 slices shown (3 of 3)]
[im 1/59]
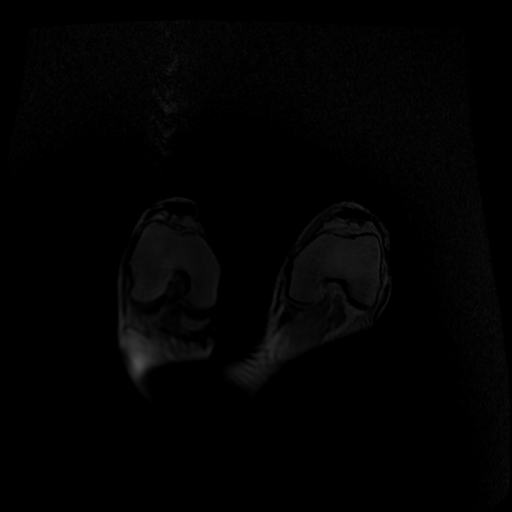
[im 30/59]
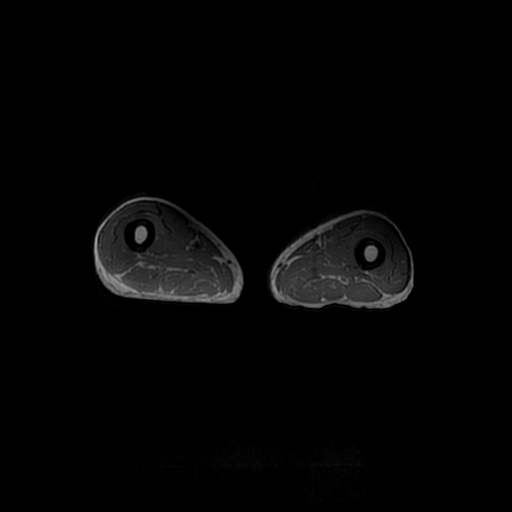
[im 59/59]
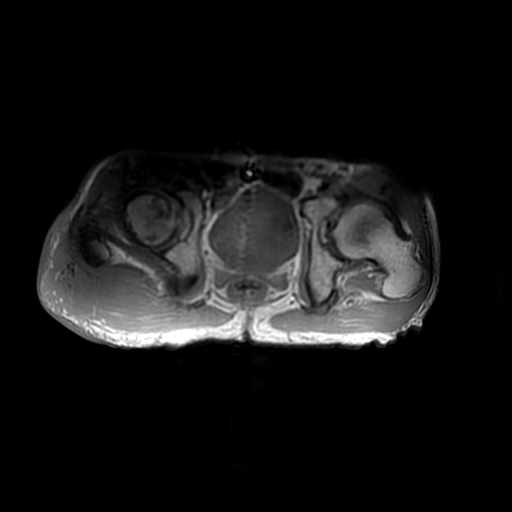

[Series 13: T2 fat-sat · axial · 5.0mm · 0.88mm/px · z∈[-334,+42]mm · 3 of 59 slices shown (2 of 2)]
[im 1/59]
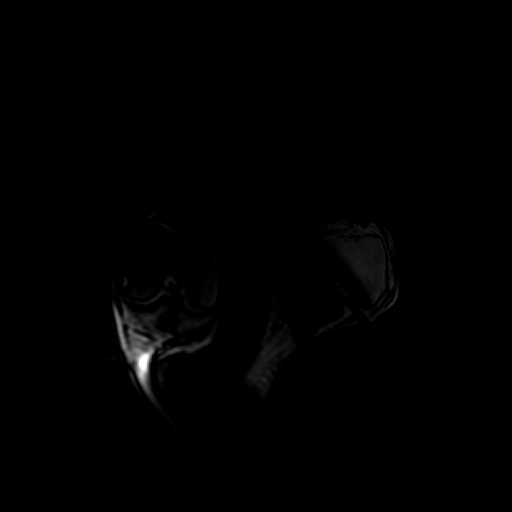
[im 30/59]
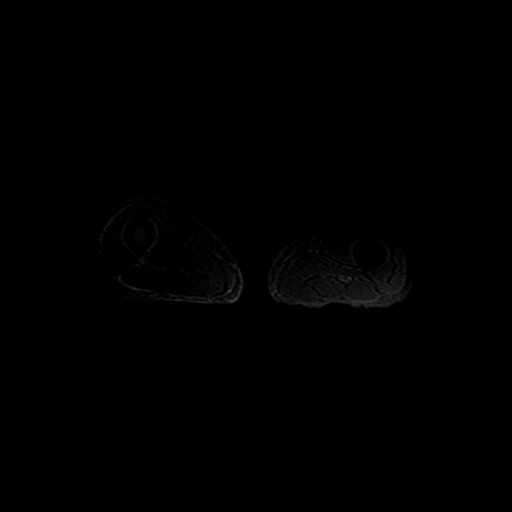
[im 59/59]
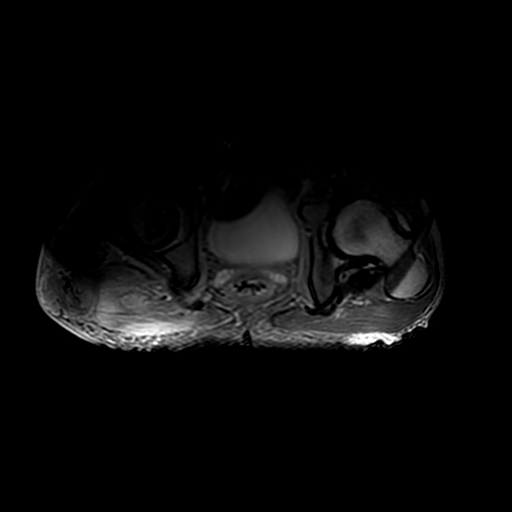

[Series 14: T1 fat-sat · axial · non-contrast · 5.0mm · 0.88mm/px · z∈[-334,-146]mm · 2 of 59 slices shown (2 of 2)]
[im 1/59]
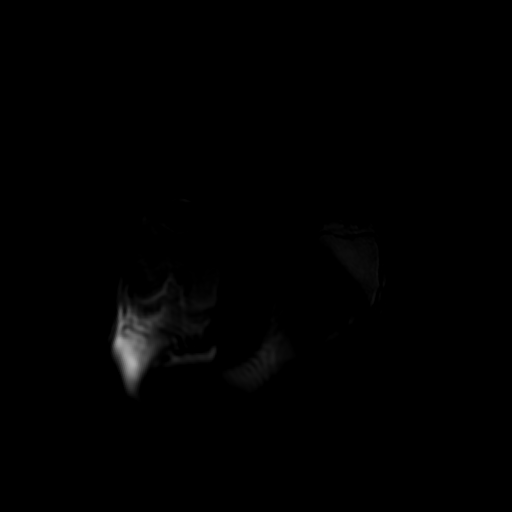
[im 30/59]
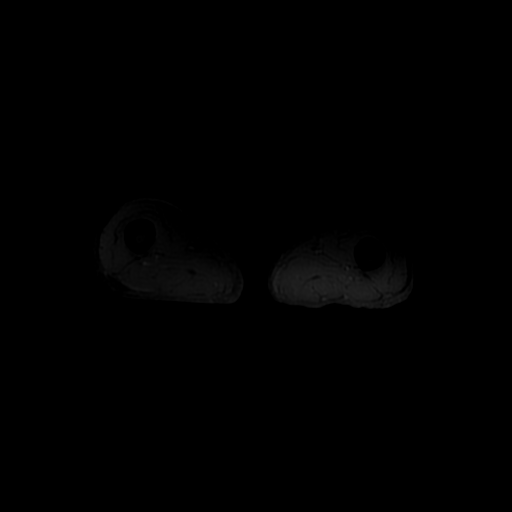

[19 of 40 positions shown; findings below may reference images not displayed]

FINDINGS: There is a large complex fluid collection in the right buttock area
superficial to the gluteus maximus muscle. This measures approximate
15 x 10 cm and contains gas. They could be a partially liquified
hematoma that has become infected. After contrast administration
there is extensive surrounding enhancement. There is also
significant edema like signal abnormality in the adjacent gluteus
maximus muscle which may be focal myositis or a muscle tear with
secondary pyomyositis. Bilateral adductor muscle edema is likely
related to muscle tears.

Bilateral hip AVN is noted. However, I do not see any findings to
suggest septic arthritis or osteomyelitis involving the hips or
pelvis.

No significant intrapelvic abnormalities are demonstrated.
IMPRESSION: 1. Large complex gas containing fluid collection in the right
buttock area most likely an infected hematoma/large abscess
measuring approximately 15 x 10 cm.
2. Mass effect on the gluteus maximus muscle which demonstrates
significant myositis and areas of pyomyositis.
3. Bilateral chronic hip AVN and remote bone infarct involving the
left femoral shaft.
4. No findings for septic arthritis or osteomyelitis involving the
pelvis, hips or right femur.
5. Bilateral adductor muscle edema, likely due to muscle strains or
partial tears.
